# Patient Record
Sex: Male | Born: 1955 | Race: White | Hispanic: No | Marital: Single | Smoking: Never smoker
Health system: Southern US, Community
[De-identification: ages and names within clinical notes are randomized; demographics above are authoritative.]

## PROBLEM LIST (undated history)

## (undated) DIAGNOSIS — T7840XA Allergy, unspecified, initial encounter: Secondary | ICD-10-CM

## (undated) DIAGNOSIS — R1310 Dysphagia, unspecified: Secondary | ICD-10-CM

## (undated) DIAGNOSIS — E876 Hypokalemia: Secondary | ICD-10-CM

## (undated) DIAGNOSIS — E538 Deficiency of other specified B group vitamins: Secondary | ICD-10-CM

## (undated) DIAGNOSIS — M6281 Muscle weakness (generalized): Secondary | ICD-10-CM

## (undated) DIAGNOSIS — J309 Allergic rhinitis, unspecified: Secondary | ICD-10-CM

## (undated) DIAGNOSIS — I509 Heart failure, unspecified: Secondary | ICD-10-CM

## (undated) DIAGNOSIS — R5381 Other malaise: Secondary | ICD-10-CM

## (undated) DIAGNOSIS — Z8711 Personal history of peptic ulcer disease: Secondary | ICD-10-CM

## (undated) DIAGNOSIS — D509 Iron deficiency anemia, unspecified: Secondary | ICD-10-CM

## (undated) DIAGNOSIS — Z9225 Personal history of immunosupression therapy: Secondary | ICD-10-CM

## (undated) DIAGNOSIS — Z9049 Acquired absence of other specified parts of digestive tract: Secondary | ICD-10-CM

## (undated) DIAGNOSIS — R011 Cardiac murmur, unspecified: Secondary | ICD-10-CM

## (undated) DIAGNOSIS — G40909 Epilepsy, unspecified, not intractable, without status epilepticus: Secondary | ICD-10-CM

## (undated) DIAGNOSIS — R55 Syncope and collapse: Secondary | ICD-10-CM

## (undated) DIAGNOSIS — R42 Dizziness and giddiness: Secondary | ICD-10-CM

## (undated) DIAGNOSIS — K8020 Calculus of gallbladder without cholecystitis without obstruction: Secondary | ICD-10-CM

## (undated) DIAGNOSIS — Z9289 Personal history of other medical treatment: Secondary | ICD-10-CM

## (undated) DIAGNOSIS — E43 Unspecified severe protein-calorie malnutrition: Secondary | ICD-10-CM

## (undated) DIAGNOSIS — E559 Vitamin D deficiency, unspecified: Secondary | ICD-10-CM

## (undated) DIAGNOSIS — Z8719 Personal history of other diseases of the digestive system: Secondary | ICD-10-CM

## (undated) DIAGNOSIS — K5090 Crohn's disease, unspecified, without complications: Secondary | ICD-10-CM

## (undated) DIAGNOSIS — A419 Sepsis, unspecified organism: Secondary | ICD-10-CM

## (undated) DIAGNOSIS — R2681 Unsteadiness on feet: Secondary | ICD-10-CM

## (undated) DIAGNOSIS — K635 Polyp of colon: Secondary | ICD-10-CM

## (undated) DIAGNOSIS — R160 Hepatomegaly, not elsewhere classified: Secondary | ICD-10-CM

## (undated) DIAGNOSIS — R251 Tremor, unspecified: Secondary | ICD-10-CM

## (undated) DIAGNOSIS — I82409 Acute embolism and thrombosis of unspecified deep veins of unspecified lower extremity: Secondary | ICD-10-CM

## (undated) DIAGNOSIS — I351 Nonrheumatic aortic (valve) insufficiency: Secondary | ICD-10-CM

## (undated) DIAGNOSIS — K56609 Unspecified intestinal obstruction, unspecified as to partial versus complete obstruction: Secondary | ICD-10-CM

## (undated) DIAGNOSIS — K5010 Crohn's disease of large intestine without complications: Secondary | ICD-10-CM

## (undated) DIAGNOSIS — K612 Anorectal abscess: Secondary | ICD-10-CM

## (undated) DIAGNOSIS — N400 Enlarged prostate without lower urinary tract symptoms: Secondary | ICD-10-CM

## (undated) DIAGNOSIS — L509 Urticaria, unspecified: Secondary | ICD-10-CM

## (undated) DIAGNOSIS — F79 Unspecified intellectual disabilities: Secondary | ICD-10-CM

## (undated) HISTORY — DX: Unspecified intellectual disabilities: F79

## (undated) HISTORY — DX: Calculus of gallbladder without cholecystitis without obstruction: K80.20

## (undated) HISTORY — PX: COLONOSCOPY: SHX174

## (undated) HISTORY — PX: UMBILICAL HERNIA REPAIR: SHX196

## (undated) HISTORY — DX: Crohn's disease, unspecified, without complications: K50.90

## (undated) HISTORY — DX: Polyp of colon: K63.5

## (undated) HISTORY — DX: Hepatomegaly, not elsewhere classified: R16.0

## (undated) HISTORY — DX: Acute embolism and thrombosis of unspecified deep veins of unspecified lower extremity: I82.409

## (undated) HISTORY — PX: TONSILLECTOMY: SUR1361

## (undated) HISTORY — DX: Allergy, unspecified, initial encounter: T78.40XA

## (undated) HISTORY — PX: HERNIA REPAIR: SHX51

## (undated) HISTORY — PX: COLON SURGERY: SHX602

## (undated) HISTORY — DX: Anorectal abscess: K61.2

## (undated) HISTORY — DX: Iron deficiency anemia, unspecified: D50.9

## (undated) HISTORY — DX: Deficiency of other specified B group vitamins: E53.8

---

## 1979-06-24 HISTORY — PX: PILONIDAL CYST EXCISION: SHX744

## 1983-10-24 HISTORY — PX: HEMICOLECTOMY: SHX854

## 1993-02-14 ENCOUNTER — Encounter: Payer: Self-pay | Admitting: Gastroenterology

## 1998-06-16 ENCOUNTER — Ambulatory Visit (HOSPITAL_COMMUNITY): Admission: RE | Admit: 1998-06-16 | Discharge: 1998-06-16 | Payer: Self-pay | Admitting: Gastroenterology

## 1998-06-17 ENCOUNTER — Ambulatory Visit (HOSPITAL_COMMUNITY): Admission: RE | Admit: 1998-06-17 | Discharge: 1998-06-17 | Payer: Self-pay | Admitting: Gastroenterology

## 1999-01-07 ENCOUNTER — Ambulatory Visit (HOSPITAL_COMMUNITY): Admission: RE | Admit: 1999-01-07 | Discharge: 1999-01-07 | Payer: Self-pay | Admitting: Gastroenterology

## 1999-01-07 ENCOUNTER — Encounter: Payer: Self-pay | Admitting: Gastroenterology

## 1999-08-15 ENCOUNTER — Encounter (HOSPITAL_COMMUNITY): Admission: RE | Admit: 1999-08-15 | Discharge: 1999-11-13 | Payer: Self-pay | Admitting: Gastroenterology

## 2000-10-05 DIAGNOSIS — K5010 Crohn's disease of large intestine without complications: Secondary | ICD-10-CM

## 2000-10-05 HISTORY — DX: Crohn's disease of large intestine without complications: K50.10

## 2000-10-09 ENCOUNTER — Encounter: Payer: Self-pay | Admitting: Gastroenterology

## 2000-10-09 ENCOUNTER — Ambulatory Visit (HOSPITAL_COMMUNITY): Admission: RE | Admit: 2000-10-09 | Discharge: 2000-10-09 | Payer: Self-pay | Admitting: Gastroenterology

## 2004-11-24 ENCOUNTER — Ambulatory Visit: Payer: Self-pay | Admitting: Gastroenterology

## 2005-05-19 ENCOUNTER — Ambulatory Visit: Payer: Self-pay | Admitting: Gastroenterology

## 2005-06-02 ENCOUNTER — Ambulatory Visit: Payer: Self-pay | Admitting: Gastroenterology

## 2005-06-09 ENCOUNTER — Ambulatory Visit: Payer: Self-pay | Admitting: Gastroenterology

## 2005-06-16 ENCOUNTER — Ambulatory Visit: Payer: Self-pay | Admitting: Gastroenterology

## 2005-11-28 ENCOUNTER — Ambulatory Visit: Payer: Self-pay | Admitting: Gastroenterology

## 2006-05-31 ENCOUNTER — Ambulatory Visit: Payer: Self-pay | Admitting: Gastroenterology

## 2006-12-10 ENCOUNTER — Ambulatory Visit: Payer: Self-pay | Admitting: Gastroenterology

## 2006-12-10 LAB — CONVERTED CEMR LAB
Basophils Absolute: 0.1 10*3/uL (ref 0.0–0.1)
Basophils Relative: 3.3 % — ABNORMAL HIGH (ref 0.0–1.0)
Eosinophils Absolute: 0 10*3/uL (ref 0.0–0.6)
Eosinophils Relative: 0.1 % (ref 0.0–5.0)
HCT: 40.9 % (ref 39.0–52.0)
Hemoglobin: 14.2 g/dL (ref 13.0–17.0)
Iron: 160 ug/dL (ref 42–165)
Lymphocytes Relative: 10.5 % — ABNORMAL LOW (ref 12.0–46.0)
MCHC: 34.6 g/dL (ref 30.0–36.0)
MCV: 111.7 fL — ABNORMAL HIGH (ref 78.0–100.0)
Monocytes Absolute: 0.3 10*3/uL (ref 0.2–0.7)
Monocytes Relative: 7.8 % (ref 3.0–11.0)
Neutro Abs: 3.3 10*3/uL (ref 1.4–7.7)
Neutrophils Relative %: 78.3 % — ABNORMAL HIGH (ref 43.0–77.0)
Platelets: 308 10*3/uL (ref 150–400)
RBC: 3.66 M/uL — ABNORMAL LOW (ref 4.22–5.81)
RDW: 14 % (ref 11.5–14.6)
Saturation Ratios: 61.1 % — ABNORMAL HIGH (ref 20.0–50.0)
Transferrin: 186.9 mg/dL — ABNORMAL LOW (ref >=212.0)
WBC: 4.1 10*3/uL — ABNORMAL LOW (ref 4.5–10.5)

## 2007-06-25 ENCOUNTER — Ambulatory Visit: Payer: Self-pay | Admitting: Gastroenterology

## 2007-06-25 LAB — CONVERTED CEMR LAB
ALT: 16 units/L (ref 0–53)
AST: 18 units/L (ref 0–37)
Albumin: 3.6 g/dL (ref 3.5–5.2)
Alkaline Phosphatase: 126 units/L — ABNORMAL HIGH (ref 39–117)
BUN: 7 mg/dL (ref 6–23)
Basophils Absolute: 0 10*3/uL (ref 0.0–0.1)
Basophils Relative: 0.2 % (ref 0.0–1.0)
Bilirubin, Direct: 0.2 mg/dL (ref 0.0–0.3)
CO2: 30 meq/L (ref 19–32)
Calcium: 8.7 mg/dL (ref 8.4–10.5)
Chloride: 103 meq/L (ref 96–112)
Creatinine, Ser: 1.1 mg/dL (ref 0.4–1.5)
Eosinophils Absolute: 0 10*3/uL (ref 0.0–0.6)
Eosinophils Relative: 0.1 % (ref 0.0–5.0)
Ferritin: 43.7 ng/mL (ref 22.0–322.0)
Folate: 11.9 ng/mL
GFR calc Af Amer: 90 mL/min
GFR calc non Af Amer: 74 mL/min
Glucose, Bld: 117 mg/dL — ABNORMAL HIGH (ref 70–99)
HCT: 42.7 % (ref 39.0–52.0)
Hemoglobin: 14.6 g/dL (ref 13.0–17.0)
Iron: 99 ug/dL (ref 42–165)
Lymphocytes Relative: 11.2 % — ABNORMAL LOW (ref 12.0–46.0)
MCHC: 34.1 g/dL (ref 30.0–36.0)
MCV: 112 fL — ABNORMAL HIGH (ref 78.0–100.0)
Monocytes Absolute: 0.7 10*3/uL (ref 0.2–0.7)
Monocytes Relative: 12.8 % — ABNORMAL HIGH (ref 3.0–11.0)
Neutro Abs: 3.8 10*3/uL (ref 1.4–7.7)
Neutrophils Relative %: 75.7 % (ref 43.0–77.0)
Platelets: 324 10*3/uL (ref 150–400)
Potassium: 4.3 meq/L (ref 3.5–5.1)
RBC: 3.77 M/uL — ABNORMAL LOW (ref 4.22–5.81)
RDW: 13.7 % (ref 11.5–14.6)
Saturation Ratios: 36.1 % (ref 20.0–50.0)
Sodium: 137 meq/L (ref 135–145)
TSH: 2.75 microintl units/mL (ref 0.35–5.50)
Total Bilirubin: 1.1 mg/dL (ref 0.3–1.2)
Total Protein: 7.3 g/dL (ref 6.0–8.3)
Transferrin: 195.7 mg/dL — ABNORMAL LOW (ref >=212.0)
Vitamin B-12: 193 pg/mL — ABNORMAL LOW (ref 211–911)
WBC: 5.1 10*3/uL (ref 4.5–10.5)

## 2007-07-31 ENCOUNTER — Ambulatory Visit (HOSPITAL_COMMUNITY): Admission: RE | Admit: 2007-07-31 | Discharge: 2007-07-31 | Payer: Self-pay | Admitting: Gastroenterology

## 2007-07-31 ENCOUNTER — Ambulatory Visit: Payer: Self-pay | Admitting: Gastroenterology

## 2008-01-17 DIAGNOSIS — K612 Anorectal abscess: Secondary | ICD-10-CM | POA: Insufficient documentation

## 2008-01-17 DIAGNOSIS — E538 Deficiency of other specified B group vitamins: Secondary | ICD-10-CM | POA: Insufficient documentation

## 2008-01-17 DIAGNOSIS — D509 Iron deficiency anemia, unspecified: Secondary | ICD-10-CM | POA: Insufficient documentation

## 2008-01-24 ENCOUNTER — Ambulatory Visit: Payer: Self-pay | Admitting: Gastroenterology

## 2008-01-24 LAB — CONVERTED CEMR LAB
ALT: 13 units/L (ref 0–53)
AST: 15 units/L (ref 0–37)
Albumin: 3.7 g/dL (ref 3.5–5.2)
Alkaline Phosphatase: 135 units/L — ABNORMAL HIGH (ref 39–117)
BUN: 6 mg/dL (ref 6–23)
Basophils Absolute: 0 10*3/uL (ref 0.0–0.1)
Basophils Relative: 0.4 % (ref 0.0–1.0)
Bilirubin, Direct: 0.2 mg/dL (ref 0.0–0.3)
CO2: 29 meq/L (ref 19–32)
Calcium: 8.7 mg/dL (ref 8.4–10.5)
Chloride: 106 meq/L (ref 96–112)
Creatinine, Ser: 1 mg/dL (ref 0.4–1.5)
Eosinophils Absolute: 0 10*3/uL (ref 0.0–0.7)
Eosinophils Relative: 0.2 % (ref 0.0–5.0)
Ferritin: 22.8 ng/mL (ref 22.0–322.0)
Folate: 11.4 ng/mL
GFR calc Af Amer: 101 mL/min
GFR calc non Af Amer: 83 mL/min
Glucose, Bld: 120 mg/dL — ABNORMAL HIGH (ref 70–99)
HCT: 43.6 % (ref 39.0–52.0)
Hemoglobin: 14.4 g/dL (ref 13.0–17.0)
Iron: 167 ug/dL — ABNORMAL HIGH (ref 42–165)
Lymphocytes Relative: 11.9 % — ABNORMAL LOW (ref 12.0–46.0)
MCHC: 33.1 g/dL (ref 30.0–36.0)
MCV: 113.6 fL — ABNORMAL HIGH (ref 78.0–100.0)
Monocytes Absolute: 0.6 10*3/uL (ref 0.1–1.0)
Monocytes Relative: 11.6 % (ref 3.0–12.0)
Neutro Abs: 3.8 10*3/uL (ref 1.4–7.7)
Neutrophils Relative %: 75.9 % (ref 43.0–77.0)
Platelets: 267 10*3/uL (ref 150–400)
Potassium: 4.1 meq/L (ref 3.5–5.1)
RBC: 3.84 M/uL — ABNORMAL LOW (ref 4.22–5.81)
RDW: 14.8 % — ABNORMAL HIGH (ref 11.5–14.6)
Saturation Ratios: 68.4 % — ABNORMAL HIGH (ref 20.0–50.0)
Sed Rate: 16 mm/hr (ref 0–16)
Sodium: 140 meq/L (ref 135–145)
TSH: 2.7 microintl units/mL (ref 0.35–5.50)
Total Bilirubin: 0.9 mg/dL (ref 0.3–1.2)
Total Protein: 7.5 g/dL (ref 6.0–8.3)
Transferrin: 174.4 mg/dL — ABNORMAL LOW (ref >=212.0)
Vitamin B-12: 429 pg/mL (ref 211–911)
WBC: 5 10*3/uL (ref 4.5–10.5)

## 2008-01-29 DIAGNOSIS — K8020 Calculus of gallbladder without cholecystitis without obstruction: Secondary | ICD-10-CM | POA: Insufficient documentation

## 2008-01-29 DIAGNOSIS — K632 Fistula of intestine: Secondary | ICD-10-CM | POA: Insufficient documentation

## 2008-04-10 ENCOUNTER — Telehealth: Payer: Self-pay | Admitting: Gastroenterology

## 2008-08-21 ENCOUNTER — Ambulatory Visit: Payer: Self-pay | Admitting: Gastroenterology

## 2008-08-21 DIAGNOSIS — L259 Unspecified contact dermatitis, unspecified cause: Secondary | ICD-10-CM | POA: Insufficient documentation

## 2008-12-08 ENCOUNTER — Telehealth: Payer: Self-pay | Admitting: Gastroenterology

## 2009-02-22 ENCOUNTER — Encounter: Payer: Self-pay | Admitting: Gastroenterology

## 2009-03-05 ENCOUNTER — Ambulatory Visit: Payer: Self-pay | Admitting: Gastroenterology

## 2009-03-05 DIAGNOSIS — R011 Cardiac murmur, unspecified: Secondary | ICD-10-CM | POA: Insufficient documentation

## 2009-09-13 ENCOUNTER — Telehealth: Payer: Self-pay | Admitting: Gastroenterology

## 2009-09-30 ENCOUNTER — Encounter: Payer: Self-pay | Admitting: Gastroenterology

## 2009-10-05 ENCOUNTER — Encounter: Payer: Self-pay | Admitting: Gastroenterology

## 2009-11-15 ENCOUNTER — Ambulatory Visit: Payer: Self-pay | Admitting: Gastroenterology

## 2009-11-15 LAB — CONVERTED CEMR LAB
Basophils Absolute: 0 10*3/uL (ref 0.0–0.1)
Basophils Relative: 0.6 % (ref 0.0–3.0)
Eosinophils Absolute: 0 10*3/uL (ref 0.0–0.7)
Eosinophils Relative: 0.1 % (ref 0.0–5.0)
HCT: 43.1 % (ref 39.0–52.0)
Hemoglobin: 14.4 g/dL (ref 13.0–17.0)
Lymphocytes Relative: 9.4 % — ABNORMAL LOW (ref 12.0–46.0)
Lymphs Abs: 0.4 10*3/uL — ABNORMAL LOW (ref 0.7–4.0)
MCHC: 33.4 g/dL (ref 30.0–36.0)
MCV: 114.9 fL — ABNORMAL HIGH (ref 78.0–100.0)
Monocytes Absolute: 0.5 10*3/uL (ref 0.1–1.0)
Monocytes Relative: 10.5 % (ref 3.0–12.0)
Neutro Abs: 3.7 10*3/uL (ref 1.4–7.7)
Neutrophils Relative %: 79.4 % — ABNORMAL HIGH (ref 43.0–77.0)
Platelets: 268 10*3/uL (ref 150.0–400.0)
RBC: 3.76 M/uL — ABNORMAL LOW (ref 4.22–5.81)
RDW: 13.7 % (ref 11.5–14.6)
WBC: 4.6 10*3/uL (ref 4.5–10.5)

## 2010-04-13 ENCOUNTER — Encounter: Payer: Self-pay | Admitting: Gastroenterology

## 2010-05-16 ENCOUNTER — Ambulatory Visit: Payer: Self-pay | Admitting: Gastroenterology

## 2010-05-16 LAB — CONVERTED CEMR LAB
ALT: 16 units/L (ref 0–53)
AST: 19 units/L (ref 0–37)
Albumin: 3.8 g/dL (ref 3.5–5.2)
Alkaline Phosphatase: 126 units/L — ABNORMAL HIGH (ref 39–117)
Basophils Absolute: 0.1 10*3/uL (ref 0.0–0.1)
Basophils Relative: 1.2 % (ref 0.0–3.0)
Bilirubin, Direct: 0.2 mg/dL (ref 0.0–0.3)
Eosinophils Absolute: 0 10*3/uL (ref 0.0–0.7)
Eosinophils Relative: 0 % (ref 0.0–5.0)
HCT: 41.8 % (ref 39.0–52.0)
Hemoglobin: 14.4 g/dL (ref 13.0–17.0)
Lymphocytes Relative: 11 % — ABNORMAL LOW (ref 12.0–46.0)
Lymphs Abs: 0.5 10*3/uL — ABNORMAL LOW (ref 0.7–4.0)
MCHC: 34.6 g/dL (ref 30.0–36.0)
MCV: 108.3 fL — ABNORMAL HIGH (ref 78.0–100.0)
Monocytes Absolute: 0.6 10*3/uL (ref 0.1–1.0)
Monocytes Relative: 11.5 % (ref 3.0–12.0)
Neutro Abs: 3.7 10*3/uL (ref 1.4–7.7)
Neutrophils Relative %: 76.3 % (ref 43.0–77.0)
Platelets: 320 10*3/uL (ref 150.0–400.0)
RBC: 3.86 M/uL — ABNORMAL LOW (ref 4.22–5.81)
RDW: 14.3 % (ref 11.5–14.6)
Total Bilirubin: 0.7 mg/dL (ref 0.3–1.2)
Total Protein: 7 g/dL (ref 6.0–8.3)
WBC: 4.8 10*3/uL (ref 4.5–10.5)

## 2010-05-17 ENCOUNTER — Ambulatory Visit: Payer: Self-pay | Admitting: Gastroenterology

## 2010-11-24 ENCOUNTER — Encounter: Payer: Self-pay | Admitting: Gastroenterology

## 2010-11-24 NOTE — Assessment & Plan Note (Signed)
Summary: 1 yr follow up, dfs   History of Present Illness Visit Type: Follow-up Visit Primary GI MD: Verl Blalock MD [place] Primary Provider: Claris Gower, MD Chief Complaint: Crohn's, patient has not had any flares History of Present Illness:   No current general medical or gastrointestinal complaints. He is on chronic 6-MP 50 mg a day and Azulfidine 500 mg q.i.d. for chronic Crohn's fistulizing disease. He has not had previous biological infusions. His blood counts were checked every 6 months by Dr. Arelia Sneddon, and review of labs recently showed these to be normal with a stable white count. Previous iron deficiency was a problem, but has resolved with control of his Crohn's disease. He is on monthly B12 injection therapy, also multiple medications for chronic seizure disorder.   GI Review of Systems      Denies abdominal pain, acid reflux, belching, bloating, chest pain, dysphagia with liquids, dysphagia with solids, heartburn, loss of appetite, nausea, vomiting, vomiting blood, weight loss, and  weight gain.        Denies anal fissure, black tarry stools, change in bowel habit, constipation, diarrhea, diverticulosis, fecal incontinence, heme positive stool, hemorrhoids, irritable bowel syndrome, jaundice, light color stool, liver problems, rectal bleeding, and  rectal pain.    Current Medications (verified): 1)  Cyanocobalamin 1000 Mcg/ml Soln (Cyanocobalamin) .... Inject 1 Ml Intramuscularly 2)  Folic Acid 1 Mg Tabs (Folic Acid) .... Take One By Mouth Once Daily 3)  Monoject Safety Syringe/shield 22g X 1" 3 Ml Misc (Syringe/needle (Disp)) .... Use 1 Syringe As Directed 4)  Sulfasalazine 500 Mg Tabs (Sulfasalazine) .... One By Mouth Four Times A Day 5)  Mercaptopurine 50 Mg  Tabs (Mercaptopurine) .... Take One and Half By Mouth Once Daily 6)  Dilantin 100 Mg Caps (Phenytoin Sodium Extended) .... Take One By Mouth Once Daily 7)  Niaspan 500 Mg Cr-Tabs (Niacin (Antihyperlipidemic))  .[name].. 1 Tablet By Mouth At Bedtime 8)  Hydrocortisone 1 % Crea (Hydrocortisone) .... Apply 3-4- Times A Day To Rash 9)  Primidone 50 Mg Tabs (Primidone) .... Take 2 Tablet By Mouth Two Times A Day  Allergies (verified): No Known Drug Allergies  Past History:  Past medical, surgical, family and social histories (including risk factors) reviewed for relevance to current acute and chronic problems.  Past Medical History: Reviewed history from 01/17/2008 and no changes required. Current Problems:  ANEMIA, IRON DEFICIENCY (ICD-280.9) ABSCESS, RECTUM (ICD-566) CROHN'S DISEASE (ICD-555.9) SEIZURE DISORDER (ICD-780.39) VITAMIN B12 DEFICIENCY (ICD-266.2)  Past Surgical History: Reviewed history from 01/29/2008 and no changes required. Rt. Hemicolectomy & ileectomy (1985) Rectal Abscess (1983) Cyst removed from [place] History: Reviewed history from 08/21/2008 and no changes required. No FH of Colon Cancer:  Social History: Reviewed history from 03/05/2009 and no changes required. Patient has never smoked.  Alcohol Use - no Illicit Drug Use - no Patient does not get regular exercise.  Occupation: Disabled  Review of Systems  The patient denies allergy/sinus, anemia, anxiety-new, arthritis/joint pain, back pain, blood in urine, breast changes/lumps, change in vision, confusion, cough, coughing up blood, depression-new, fainting, fatigue, fever, headaches-new, hearing problems, heart murmur, heart rhythm changes, itching, menstrual pain, muscle pains/cramps, night sweats, nosebleeds, pregnancy symptoms, shortness of breath, skin rash, sleeping problems, sore throat, swelling of feet/legs, swollen lymph glands, thirst - excessive , urination - excessive , urination changes/pain, urine leakage, vision changes, and voice change.    Vital Signs:  Patient profile:   55 year old male Height:  70 inches Weight:      218.13 pounds BMI:     31.41 Pulse rate:   60 / minute Pulse  rhythm:   regular BP sitting:   130 / 66  (left arm) Cuff size:   regular  Vitals Entered By: June McMurray [place] Deborra Medina) (May 17, 2010 2:53 PM)  Physical Exam  General:  Well developed, well nourished, no acute distress.healthy appearing.   Head:  Normocephalic and atraumatic. Eyes:  PERRLA, no icterus. Lungs:  Clear throughout to auscultation. Heart:  Regular rate and rhythm; no murmurs, rubs,  or bruits. Abdomen:  Soft, nontender and nondistended. No masses, hepatosplenomegaly or hernias noted. Normal bowel sounds. Extremities:  No clubbing, cyanosis, edema or deformities noted. Neurologic:  Alert and  oriented x4;  grossly normal neurologically. Skin:  Intact without significant lesions or rashes.eczematous rash:[name]   Psych:  Alert and cooperative. Normal mood and affect.   Impression & Recommendations:  Problem # 1:  REGIONAL ENTERITIS, LARGE INTESTINE (ICD-555.1) Assessment Improved Continue current medications at current doses with every 6 month CBC and office visits.  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  Patient Instructions: 1)  Please take 6 MP as directed below--one tablet daily. 2)  We will call in longer needles for B 12 injections. 3)  Please schedule a follow-up appointment as needed.  4)  The medication list was reviewed and reconciled.  All changed / newly prescribed medications were explained.  A complete medication list was provided to the patient / caregiver. 5)  Copy sent to : Dr. Claris Gower. 6)  Please continue current medications.  7)  Please schedule a follow-up appointment in 6 months.

## 2010-12-09 ENCOUNTER — Ambulatory Visit (INDEPENDENT_AMBULATORY_CARE_PROVIDER_SITE_OTHER): Payer: Medicare Other | Admitting: Gastroenterology

## 2010-12-09 ENCOUNTER — Other Ambulatory Visit: Payer: Self-pay | Admitting: Gastroenterology

## 2010-12-09 ENCOUNTER — Other Ambulatory Visit: Payer: Medicare Other

## 2010-12-09 ENCOUNTER — Encounter: Payer: Self-pay | Admitting: Gastroenterology

## 2010-12-09 ENCOUNTER — Encounter (INDEPENDENT_AMBULATORY_CARE_PROVIDER_SITE_OTHER): Payer: Self-pay | Admitting: *Deleted

## 2010-12-09 DIAGNOSIS — K5090 Crohn's disease, unspecified, without complications: Secondary | ICD-10-CM

## 2010-12-09 DIAGNOSIS — K5010 Crohn's disease of large intestine without complications: Secondary | ICD-10-CM

## 2010-12-09 LAB — CBC WITH DIFFERENTIAL/PLATELET
Basophils Relative: 0.7 % (ref 0.0–3.0)
Eosinophils Absolute: 0 10*3/uL (ref 0.0–0.7)
Hemoglobin: 15.1 g/dL (ref 13.0–17.0)
Lymphs Abs: 0.8 10*3/uL (ref 0.7–4.0)
MCHC: 34.2 g/dL (ref 30.0–36.0)
MCV: 111 fl — ABNORMAL HIGH (ref 78.0–100.0)
Monocytes Absolute: 0.7 10*3/uL (ref 0.1–1.0)
Neutro Abs: 4.3 10*3/uL (ref 1.4–7.7)
RBC: 3.98 Mil/uL — ABNORMAL LOW (ref 4.22–5.81)

## 2010-12-09 LAB — HEPATIC FUNCTION PANEL
ALT: 12 U/L (ref 0–53)
Total Bilirubin: 0.7 mg/dL (ref 0.3–1.2)

## 2010-12-09 LAB — BASIC METABOLIC PANEL
BUN: 8 mg/dL (ref 6–23)
Calcium: 8.6 mg/dL (ref 8.4–10.5)
Creatinine, Ser: 0.9 mg/dL (ref 0.4–1.5)
GFR: 92.59 mL/min (ref >60.00)

## 2010-12-09 LAB — FOLATE: Folate: 12.3 ng/mL (ref >5.9)

## 2010-12-09 LAB — TSH: TSH: 2.84 u[IU]/mL (ref 0.35–5.50)

## 2010-12-09 LAB — IBC PANEL
Iron: 110 ug/dL (ref 42–165)
Saturation Ratios: 44.6 % (ref 20.0–50.0)
Transferrin: 176.3 mg/dL — ABNORMAL LOW (ref 212.0–360.0)

## 2010-12-09 LAB — FERRITIN: Ferritin: 35.9 ng/mL (ref 22.0–322.0)

## 2010-12-09 LAB — VITAMIN B12: Vitamin B-12: 478 pg/mL (ref 211–911)

## 2010-12-14 NOTE — Assessment & Plan Note (Signed)
Summary: Follow up on labs    History of Present Illness Visit Type: Follow-up Visit Primary GI MD: Verl Blalock MD [place] Primary Provider: Claris Gower, MD Requesting Provider: na Chief Complaint: F/u on labs. Pt denies any GI complaints  History of Present Illness:   55 year old Caucasian male with mild mental handicap he lives with his mother who takes excellent care of the patient. I have followed him for many years because of his Crohn's disease involving his colon with previous multiple fistulization's. He has had excellent response to 6-MP 50 mg a day and sulfasalazine 500 mg q.i.d. He currently is asymptomatic and denies abdominal pain, bowel irregularity, anorexia or weight loss, or any systemic complaints. He does take daily folic acid and T90 replacement.   GI Review of Systems      Denies abdominal pain, acid reflux, belching, bloating, chest pain, dysphagia with liquids, dysphagia with solids, heartburn, loss of appetite, nausea, vomiting, vomiting blood, weight loss, and  weight gain.        Denies anal fissure, black tarry stools, change in bowel habit, constipation, diarrhea, diverticulosis, fecal incontinence, heme positive stool, hemorrhoids, irritable bowel syndrome, jaundice, light color stool, liver problems, rectal bleeding, and  rectal pain.    Current Medications (verified): 1)  Cyanocobalamin 1000 Mcg/ml Soln (Cyanocobalamin) .... Inject 1 Ml Intramuscularly 2)  Folic Acid 1 Mg Tabs (Folic Acid) .... Take One By Mouth Once Daily 3)  Monoject Safety Syringe/shield 22g X 1" 3 Ml Misc (Syringe/needle (Disp)) .... Use 1 Syringe As Directed 4)  Sulfasalazine 500 Mg Tabs (Sulfasalazine) .... One By Mouth Four Times A Day 5)  Mercaptopurine 50 Mg  Tabs (Mercaptopurine) .... Take One Tablet By Mouth Once Daily 6)  Dilantin 100 Mg Caps (Phenytoin Sodium Extended) .... Take One By Mouth Once Daily 7)  Niaspan 500 Mg Cr-Tabs (Niacin (Antihyperlipidemic)) .[name].. 1  Tablet By Mouth At Bedtime 8)  Primidone 50 Mg Tabs (Primidone) .... Take 2 Tablet By Mouth Two Times A Day  Allergies (verified): No Known Drug Allergies  Past History:  Past medical, surgical, family and social histories (including risk factors) reviewed for relevance to current acute and chronic problems.  Past Medical History: ANEMIA, IRON DEFICIENCY (ICD-280.9) ABSCESS, RECTUM (ICD-566) CROHN'S DISEASE (ICD-555.9) SEIZURE DISORDER (ICD-780.39) VITAMIN B12 DEFICIENCY (ICD-266.2)  Past Surgical History: Reviewed history from 01/29/2008 and no changes required. Rt. Hemicolectomy & ileectomy (1985) Rectal Abscess (1983) Cyst removed from [place] History: Reviewed history from 08/21/2008 and no changes required. No FH of Colon Cancer:  Social History: Reviewed history from 03/05/2009 and no changes required. Patient has never smoked.  Alcohol Use - no Illicit Drug Use - no Patient does not get regular exercise.  Occupation: Disabled  Review of Systems  The patient denies allergy/sinus, anemia, anxiety-new, arthritis/joint pain, back pain, blood in urine, breast changes/lumps, change in vision, confusion, cough, coughing up blood, depression-new, fainting, fatigue, fever, hearing problems, heart murmur, heart rhythm changes, itching, menstrual pain, muscle pains/cramps, night sweats, nosebleeds, pregnancy symptoms, shortness of breath, skin rash, sleeping problems, sore throat, swelling of feet/legs, swollen lymph glands, thirst - excessive , urination - excessive , urination changes/pain, urine leakage, vision changes, voice change, and headaches-new.    Vital Signs:  Patient profile:   55 year old male Height:      70 inches Weight:      215 pounds BMI:     30.96 BSA:     2.15 Pulse rate:   60 / minute  Pulse rhythm:   regular BP sitting:   128 / 64  (left arm) Cuff size:   regular  Vitals Entered By: Hope Pigeon CMA (December 09, 2010 11:48 AM)  Physical  Exam  General:  Well developed, well nourished, no acute distress.healthy appearing.   Head:  Normocephalic and atraumatic. Eyes:  PERRLA, no icterus.exam deferred to patient's ophthalmologist.   Abdomen:  Soft, nontender and nondistended. No masses, hepatosplenomegaly or hernias noted. Normal bowel sounds. Extremities:  No clubbing, cyanosis, edema or deformities noted. Neurologic:  Alert and  oriented x4;  grossly normal neurologically. Psych:  Alert and cooperative. Normal mood and affect.   Impression & Recommendations:  Problem # 1:  REGIONAL ENTERITIS, LARGE INTESTINE (ICD-555.1) Assessment Improved continue 6-MP 50 mg a day and Azulfidine 5 mg q.i.d. CBC, metabolic profile, and 6-MP metabolites ordered. I will see him every 6 months intervals and perform his labs  Problem # 2:  ECZEMA (ICD-692.9) Assessment: Improved  Problem # 3:  CHOLELITHIASIS, ASYMPTOMATIC (ICD-574.20) Assessment: Unchanged  Problem # 4:  ANEMIA, IRON DEFICIENCY (ICD-280.9) Assessment: Improved continue P32, folic acid, and multivitamins.  Problem # 5:  SEIZURE DISORDER (ICD-780.39) Assessment: Improved continue Dilantin 100 mg twice a day and primidone 50 mg 2 tablets b.i.d.,  Other Orders: TLB-CBC Platelet - w/Differential (85025-CBCD) TLB-BMP (Basic Metabolic Panel-BMET) (95188-CZYSAYT) TLB-Hepatic/Liver Function Pnl (80076-HEPATIC) TLB-TSH (Thyroid Stimulating Hormone) (84443-TSH) TLB-B12, Serum-Total ONLY (01601-U93) TLB-Ferritin (23557-DUK) TLB-Folic Acid (Folate) (02542-HCW) TLB-IBC Pnl (Iron/FE;Transferrin) (83550-IBC) Thiopurine Metabolites ([facility] #3200) (23762)  Patient Instructions: 1)  Copy sent to : Claris Gower, MD 2)  Please go to the basement today for your labs.  3)  Your prescription(s) have been sent to you pharmacy.  4)  Please go to [facility] for your [facility] labs. 5)  Address: [place], [place] 6)  Phone: [phone] 7)  The medication  list was reviewed and reconciled.  All changed / newly prescribed medications were explained.  A complete medication list was provided to the patient / caregiver. 8)  Please schedule a follow-up appointment in 6 months. Prescriptions: MERCAPTOPURINE 50 MG  TABS (MERCAPTOPURINE) take one tablet by mouth once daily  #135 Tablet x 2   Entered by:   Bernita Buffy CMA ([place])   Authorized by:   Sable Feil MD [facility]   Signed by:   Bernita Buffy CMA ([place]) on 12/09/2010   Method used:   Electronically to        [facility]. #[id number]* (retail)       [address]       [place], [place]  [address]       Ph: [phone] or [phone]       Fax: [phone]   RxID:   [id number] SULFASALAZINE 500 MG TABS (SULFASALAZINE) one by mouth four times a day  #360 Tablet x 5   Entered by:   Bernita Buffy CMA ([place])   Authorized by:   Sable Feil MD [facility]   Signed by:   Bernita Buffy CMA ([place]) on 12/09/2010   Method used:   Electronically to        [facility]. #[id number]* (retail)       [address]       [place], [place]  [address]       Ph: [phone] or [phone]       Fax: [phone]   RxID:  [id number] MONOJECT SAFETY SYRINGE/SHIELD 22G X 1" 3 ML MISC (SYRINGE/NEEDLE (DISP)) use 1 syringe as directed  #15 x 3   Entered by:   Bernita Buffy CMA ([place])   Authorized by:   Sable Feil MD [facility]   Signed by:   Bernita Buffy CMA ([place]) on 12/09/2010   Method used:   Electronically to        [facility]. #[id number]* (retail)       [address]       [place], [place]  [address]       Ph: [phone] or [phone]       Fax: [phone]   RxID:   [id number] FOLIC ACID 1 MG TABS (FOLIC ACID) take one by mouth once daily  #30 Tablet x 5   Entered by:   Bernita Buffy CMA ([place])   Authorized by:   Sable Feil MD [facility]   Signed by:   Bernita Buffy CMA ([place]) on 12/09/2010   Method used:    Electronically to        [facility]. #[id number]* (retail)       [address]       [place], [place]  [address]       Ph: [phone] or [phone]       Fax: [phone]   RxID:   [id number] CYANOCOBALAMIN 1000 MCG/ML SOLN (CYANOCOBALAMIN) Inject 1 ml intramuscularly  #10 [place] x 3   Entered by:   Bernita Buffy CMA ([place])   Authorized by:   Sable Feil MD [facility]   Signed by:   Bernita Buffy CMA ([place]) on 12/09/2010   Method used:   Electronically to        [facility]. #[id number]* (retail)       [address]       [place], [place]  [address]       Ph: [phone] or [phone]       Fax: [phone]   RxID:   [id number]

## 2010-12-28 ENCOUNTER — Telehealth: Payer: Self-pay | Admitting: Gastroenterology

## 2011-01-03 NOTE — Progress Notes (Signed)
Summary: Speak to nurse  Phone Note Call from Patient Call back at [phone]   Caller: Patient Call For: Dr. Sharlett Iles Reason for Call: Talk to Nurse Summary of Call: Dezere from [facility] is calling for clarification on this patients labs that were faxed over Initial call taken by: Martinique Johnson,  December 28, 2010 1:03 PM  Follow-up for Phone Call        answered questions.  Follow-up by: Bernita Buffy CMA Deborra Medina),  December 28, 2010 1:37 PM

## 2011-01-20 ENCOUNTER — Telehealth: Payer: Self-pay | Admitting: *Deleted

## 2011-01-20 NOTE — Telephone Encounter (Signed)
Advised pts mother to continue 33m at same dose.

## 2011-01-25 ENCOUNTER — Encounter: Payer: Self-pay | Admitting: Gastroenterology

## 2011-01-30 ENCOUNTER — Encounter: Payer: Self-pay | Admitting: Gastroenterology

## 2011-01-31 ENCOUNTER — Encounter: Payer: Self-pay | Admitting: Gastroenterology

## 2011-03-07 NOTE — Assessment & Plan Note (Signed)
[place] OFFICE NOTE   JERRITT, CARDOZA                     MRN:          [id number]  DATE:01/24/2008                            DOB:          12-17-1955    [name] is doing well with his Crohn ileocolitis known ileocolonic  fistula.  He is on the same exact medications as previously outlined.  He is having no abdominal pain, diarrhea, or systemic complaints.   His colonoscopy on July 31, 2007, showed what appeared to be an  ileocolonic fistula, and I was unable to pass the colonoscope beyond the  80 cm level.  There is some chronic atrophy of the left colon mucosa but  no active disease.   A barium enema was obtained which showed narrowing of the colon in the  mid descending colon with opacification of dilated small bowel from an  apparent descending to small bowel fistula.  There was no evidence of  other abnormalities noted at the time of the barium exam.  CT scan was  not performed.   Love is awake, alert and in no acute distress.  Appeared his stated  age.  He weighs 221 pounds, and blood pressure is 120/62, and pulse was 56 and  regular.  His abdomen was nondistended, and there was no organomegaly, masses or  tenderness.  Bowel sounds were normal.   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  He had previous exploratory laparotomy and resection of distal small  bowel and right hemicolectomy with ileal transverse colostomy and  __________ of a bladder wall fistula in September of 1985.  It may just  be that what we are seeing on colonoscopy endoscopy is his anastomotic  area, since he has, as mentioned above, prior surgery.   RECOMMENDATIONS:  1. Continue on  all medications as listed above.  2. Usual screening laboratory parameters.  3.  Continue six-month follow up unless otherwise indicated.     Loralee Pacas. Sharlett Iles, MD, [place], [place]  Electronically Signed    DRP/MedQ  DD: 01/24/2008  DT: 01/24/2008  Job #: [id number]   cc:   Claris Gower, M.D.

## 2011-03-07 NOTE — Assessment & Plan Note (Signed)
[place] OFFICE NOTE   ADHRIT, KRENZ                     MRN:          [id number]  DATE:06/25/2007                            DOB:          [birth year]    Chales is doing extremely well.  He is having no problems with diarrhea,  abdominal pain, rectal bleeding, or any systemic complaints.  He denies  any upper GI or hepatobiliary problems.  He has been in remission with  his Crohn's disease, now on 6MP 75 mg a day for at least several years.  He also takes Azulfidine 500 mg four times a day.  He sees Dr. Claris Gower for management of his seizure disorder.   His previous problems with anemia have not resurfaced, and he is off of  iron therapy with normal hemoglobin and hematocrit.  The last blood work  was done in February, 2008 and was all normal except for borderline B12  levels.  He is on B12 replacement therapy.   Hoby last had a colonoscopy some 7-8 years ago.  He has a history of  chronic fibrostenosing Crohn's disease and had a right hemicolectomy in  1985.  He also has had recurrent problems with diarrhea, rectal  bleeding, and anemia, but all seems to have remarkably improved from  previous treatments for bacterial overgrowth syndrome periodically,  along with chronic immunosuppressive therapy.  In fact, Gustaf has done  so well over the last several years that I have contemplated stopping  his 6MP.   PHYSICAL EXAMINATION:  Exam today shows him to be a healthy-appearing  white male in no distress.  CHEST:  Clear.  He is in a regular rhythm without murmurs, rubs or gallops.  I cannot appreciate hepatosplenomegaly, abdominal masses, or tenderness.  He has multiple well-healed surgical scars on his abdomen.  He weighs 225 pounds.  Blood pressure is 114/62, pulse was 80 and  regular.   ASSESSMENT:  1. Fibrostenosing and fistulizing Crohn's disease, in remission on 6MP      and oral  Azulfidine.  2. History of chronic B12 deficiency, on B12 parenteral replacement.  3. Chronic seizure disorder, managed by Dr. Claris Gower.  4. Need for colonoscopy and dysplasia screening.   RECOMMENDATIONS:  1. Repeat standard lab tests, as per clinical protocol.  2. Renew meds as requested.  3. Outpatient colonoscopy at his convenience.     Loralee Pacas. Sharlett Iles, MD, [place], [place]  Electronically Signed    DRP/MedQ  DD: 06/25/2007  DT: 06/25/2007  Job #: [id number]   cc:   Claris Gower, M.D.

## 2011-03-10 NOTE — Assessment & Plan Note (Signed)
[facility] OFFICE NOTE   [name], [name]                     MRN:          [id number]  DATE:12/10/2006                            DOB:          18-Apr-1956    Dajon returns today and is doing well and is asymptomatic in terms of  any GI complaints. He is following a regular diet and is eating well and  having no diarrhea, constipation, melena or hematochezia or any  hepatobiliary complaints.   He is on the same medications as previously outlined.   LABORATORY DATA:  From August of last year showed normal iron levels,  B12 and folate levels. He is on B12 replacement therapy. CBC was stable  with a white count of 4300. Hemoglobin of 14.4, with normal metabolic  panel except for slightly increased alkaline phosphatase of 132.   Jahmeir continues on his anti-seizure medications per Dr. Arelia Sneddon, also  Azulfidine 500 mg four times a day and 6-MP 75 mg a day. He is no longer  on iron replacement therapy.   He weighs 222 pounds. His blood pressure is 102/66. Pulse was 84 and  regular.  ABDOMEN: Was unremarkable without organomegaly, masses or tenderness.  Bowel sounds were normal.  RECTAL: Deferred.   ASSESSMENT:  Willia's Crohn's disease seems to be stable at this time on  immunosuppressive therapy and amino salicylate therapy. He has also had  no evidence of persistent anemia. He looks about as good as I have seen  him in several years.   RECOMMENDATIONS:  1. Continue on medications as previously outlined.  2. Will continue 6 month followup and Saifullah needs to have colonoscopy      screening within the next year because of his age, his Crohn's      disease and his last examination was done in December of 2001.  3. Continue seizure medications per Dr. Arelia Sneddon.     Loralee Pacas. Sharlett Iles, MD, [place], [place]  Electronically Signed    DRP/MedQ  DD: 12/10/2006  DT: 12/10/2006  Job #: [id number]   cc:   Claris Gower, M.D.

## 2011-03-10 NOTE — Assessment & Plan Note (Signed)
[place] OFFICE NOTE   [name], [name]                     MRN:          [id number]  DATE:05/31/2006                            DOB:          Dec 07, 1955    [name] comes in today for evaluation of Crohn's disease and denies any GI  complaints whatsoever.  He is having regular bowel movements without melena,  hematochezia, or abdominal pain.  His appetite is good and his weight is  stable and he is eating pretty much whatever he wants.  He denies upper GI  or hepatobiliary complaints.  His seizure disorder is followed by Dr. Arelia Sneddon  and he is on his same medications which include folic acid 1 mg a day,  primidone three tablets twice a day, Azulfidine 500 mg q.i.d., 6-MP 75 mg a  day, Dilantin 100 mg a day, and monthly B12 injections.   He has previously had a problem with B12 deficiency and iron deficiency but  this has resolved with control of his Crohn's colitis.  He is seen at six-  month intervals and last blood work was unremarkable with a white cell count  of 7500, hemoglobin 14.6, platelet count of 310,000 with a normal liver  profile.  He does have a macrocytosis secondary to 6-MP therapy.   Examination shows him to be awake and alert, in no acute distress.  His  chest is clear.  I cannot appreciate murmurs, gallops, or rubs.  He is in a  regular rhythm.  His abdomen was benign without organomegaly, masses, or  tenderness.  Bowel sounds were normal.  He weighs 219 pounds and blood  pressure 110/64.   ASSESSMENT:  1. Chronic seizure disorder followed by Dr. Arelia Sneddon.  2. 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  3. History of multifactorial anemia.   RECOMMENDATIONS:  1. Continue all medications listed above.  2. Check CBC, iron levels, B12, folate, and metabolic profile.  3. Six-month follow-ups or p.r.n. as needed.                                   Loralee Pacas. Sharlett Iles, MD, [place], [place]   DRP/MedQ  DD:  05/31/2006  DT:  05/31/2006  Job #:  [id number]   cc:   Claris Gower, MD

## 2011-07-03 ENCOUNTER — Telehealth: Payer: Self-pay | Admitting: Gastroenterology

## 2011-07-03 DIAGNOSIS — R569 Unspecified convulsions: Secondary | ICD-10-CM

## 2011-07-03 DIAGNOSIS — Z9225 Personal history of immunosupression therapy: Secondary | ICD-10-CM

## 2011-07-03 NOTE — Telephone Encounter (Signed)
Mildly mentally handicapped man who lives with his mom with hx of Crohn's with multiple fistulazations, Iron Def. Anemia, Rectal Abcess, Seizure Disorder, B12 Def. Pt on 6MP 59m/daily and Sulfasalizine 5050mQID. Mom reports pt needs labs and Dr ElArelia Sneddontated he is borderline diabetic. Last labs on 12/04/10 were: IBC, Folate, Vit B12, Ferritin, TSH, Hepatic Function, BMET, CBC and a month later he had TPMT. Last OV 12/09/10. Repeat  these labs and H1C or just use Glucose from BMET? Please advise.

## 2011-07-03 NOTE — Telephone Encounter (Signed)
Mother aware, will bring him in the morning so he can be fasting

## 2011-07-03 NOTE — Telephone Encounter (Signed)
CBC AND LIVER PROFILE ONLY

## 2011-07-04 ENCOUNTER — Other Ambulatory Visit (INDEPENDENT_AMBULATORY_CARE_PROVIDER_SITE_OTHER): Payer: Medicare Other

## 2011-07-04 DIAGNOSIS — R569 Unspecified convulsions: Secondary | ICD-10-CM

## 2011-07-04 DIAGNOSIS — Z9229 Personal history of other drug therapy: Secondary | ICD-10-CM

## 2011-07-04 DIAGNOSIS — Z9225 Personal history of immunosupression therapy: Secondary | ICD-10-CM

## 2011-07-04 LAB — HEPATIC FUNCTION PANEL
AST: 19 U/L (ref 0–37)
Albumin: 3.8 g/dL (ref 3.5–5.2)

## 2011-07-04 LAB — CBC WITH DIFFERENTIAL/PLATELET
Eosinophils Relative: 0 % (ref 0.0–5.0)
HCT: 39.6 % (ref 39.0–52.0)
Lymphs Abs: 0.8 10*3/uL (ref 0.7–4.0)
Monocytes Relative: 11.4 % (ref 3.0–12.0)
Platelets: 261 10*3/uL (ref 150.0–400.0)
WBC: 5.5 10*3/uL (ref 4.5–10.5)

## 2011-07-05 ENCOUNTER — Telehealth: Payer: Self-pay | Admitting: Gastroenterology

## 2011-07-05 NOTE — Telephone Encounter (Signed)
yes

## 2011-07-05 NOTE — Telephone Encounter (Signed)
Notified pt's mom that pt's labs were normal. Mom was concerned with pt's Glucose level because Dr Arelia Sneddon states pt may be diabetic. We discussed pt's diet and how complex carbs affect his blood sugar; mom stated understanding. Mailed mom sample diabetic diets.

## 2011-07-05 NOTE — Telephone Encounter (Signed)
Ok to inform pt's Mom that labs are OK? Thanks.

## 2011-12-18 ENCOUNTER — Encounter: Payer: Self-pay | Admitting: *Deleted

## 2011-12-19 ENCOUNTER — Ambulatory Visit (INDEPENDENT_AMBULATORY_CARE_PROVIDER_SITE_OTHER): Payer: Medicare Other | Admitting: Gastroenterology

## 2011-12-19 ENCOUNTER — Encounter: Payer: Self-pay | Admitting: Gastroenterology

## 2011-12-19 ENCOUNTER — Other Ambulatory Visit (INDEPENDENT_AMBULATORY_CARE_PROVIDER_SITE_OTHER): Payer: Medicare Other

## 2011-12-19 VITALS — BP 142/86 | HR 88 | Ht 70.0 in | Wt 221.0 lb

## 2011-12-19 DIAGNOSIS — IMO0001 Reserved for inherently not codable concepts without codable children: Secondary | ICD-10-CM | POA: Insufficient documentation

## 2011-12-19 DIAGNOSIS — R6889 Other general symptoms and signs: Secondary | ICD-10-CM

## 2011-12-19 DIAGNOSIS — K5090 Crohn's disease, unspecified, without complications: Secondary | ICD-10-CM

## 2011-12-19 DIAGNOSIS — R350 Frequency of micturition: Secondary | ICD-10-CM

## 2011-12-19 DIAGNOSIS — Z9225 Personal history of immunosupression therapy: Secondary | ICD-10-CM

## 2011-12-19 DIAGNOSIS — G40909 Epilepsy, unspecified, not intractable, without status epilepticus: Secondary | ICD-10-CM | POA: Insufficient documentation

## 2011-12-19 LAB — BASIC METABOLIC PANEL
Calcium: 8.7 mg/dL (ref 8.4–10.5)
GFR: 79.85 mL/min (ref >60.00)
Glucose, Bld: 99 mg/dL (ref 70–99)
Potassium: 4.5 mEq/L (ref 3.5–5.1)
Sodium: 138 mEq/L (ref 135–145)

## 2011-12-19 LAB — CBC WITH DIFFERENTIAL/PLATELET
Basophils Absolute: 0 10*3/uL (ref 0.0–0.1)
Basophils Relative: 0.7 % (ref 0.0–3.0)
Eosinophils Absolute: 0 10*3/uL (ref 0.0–0.7)
HCT: 40.9 % (ref 39.0–52.0)
Hemoglobin: 13.3 g/dL (ref 13.0–17.0)
Lymphocytes Relative: 13.3 % (ref 12.0–46.0)
Lymphs Abs: 0.6 10*3/uL — ABNORMAL LOW (ref 0.7–4.0)
MCHC: 32.6 g/dL (ref 30.0–36.0)
MCV: 109.7 fl — ABNORMAL HIGH (ref 78.0–100.0)
Neutro Abs: 3.1 10*3/uL (ref 1.4–7.7)
RBC: 3.73 Mil/uL — ABNORMAL LOW (ref 4.22–5.81)
RDW: 14.6 % (ref 11.5–14.6)

## 2011-12-19 LAB — HEPATIC FUNCTION PANEL
Alkaline Phosphatase: 132 U/L — ABNORMAL HIGH (ref 39–117)
Bilirubin, Direct: 0.1 mg/dL (ref 0.0–0.3)
Total Bilirubin: 0.7 mg/dL (ref 0.3–1.2)

## 2011-12-19 LAB — PSA: PSA: 0.69 ng/mL (ref 0.10–4.00)

## 2011-12-19 LAB — SEDIMENTATION RATE: Sed Rate: 8 mm/hr (ref 0–22)

## 2011-12-19 LAB — TSH: TSH: 1.64 u[IU]/mL (ref 0.35–5.50)

## 2011-12-19 LAB — IBC PANEL
Iron: 122 ug/dL (ref 42–165)
Saturation Ratios: 50.5 % — ABNORMAL HIGH (ref 20.0–50.0)

## 2011-12-19 LAB — VITAMIN B12: Vitamin B-12: 831 pg/mL (ref 211–911)

## 2011-12-19 MED ORDER — PEG-KCL-NACL-NASULF-NA ASC-C 100 G PO SOLR: 1.0000 | Freq: Once | ORAL | Status: DC

## 2011-12-19 MED ORDER — CYANOCOBALAMIN 1000 MCG/ML IJ SOLN: 1000.0000 ug | INTRAMUSCULAR | Status: DC

## 2011-12-19 MED ORDER — MERCAPTOPURINE 50 MG PO TABS: 50.0000 mg | ORAL_TABLET | Freq: Every day | ORAL | Status: DC

## 2011-12-19 NOTE — Patient Instructions (Addendum)
Your procedure has been scheduled for 12/27/2011, please follow the seperate instructions.  Please go to the basement today for your labs.  Your prescription(s) have been sent to you pharmacy.

## 2011-12-19 NOTE — Progress Notes (Signed)
This is a 56 year old Caucasian male with chronic Crohn's disease and previous right hemicolectomy. He's been in remission for 5 years now on 6-MP 50 mg a day with normal labs every 6 months. In the past he has had severe iron deficiency associated with a flare of his colitis, Currently he is completely asymptomatic. He also suffers from a seizure disorder treated by Dr. Claris Gower. Patient also takes Azulfidine 500 mg 4 times a day and monthly B12 shots. His appetite is good and his weight is stable. He denies upper GI or hepatobiliary complaints.  Current Medications, Allergies, Past Medical History, Past Surgical History, Family History and Social History were reviewed in [employer] record.  Pertinent Review of Systems Negative.. the cardiovascular, pulmonary, genitourinary, or other neurological symptomatology.  Physical Exam: Healthy-appearing patient in no acute distress. Chest is clear and he appeared to be an irregular rhythm without murmurs gallops or rubs. Abdomen shows no organomegaly, masses or tenderness. Bowel sounds are normal. Mental status is normal. There are no gross focal neurological deficits, edema, phlebitis...  Assessment and Plan: Chronic inflammatory bowel disease, Crohn's disease, with previous right hemicolectomy. 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 Surprisingly, the patient has been in  complete clinical remission for the last 5 years. His followup of his CBC and liver function tests have been normal. There's been no evidence of recurrent anemia. I have renewed all of his medications,and  will proceed with attempts at repeat colonoscopy for colorectal cancer screening. Risks and benefits of this procedure have been explained to the patient and his mother who is now age 67. She is taking extraordinary  wonderful care of [place] over the years.PSA also requested. Encounter Diagnoses  Name Primary?  . Crohn's disease Yes  . Other general symptoms

## 2011-12-20 ENCOUNTER — Other Ambulatory Visit: Payer: Self-pay | Admitting: Gastroenterology

## 2011-12-25 ENCOUNTER — Telehealth: Payer: Self-pay | Admitting: Gastroenterology

## 2011-12-25 NOTE — Telephone Encounter (Signed)
NO

## 2011-12-25 NOTE — Telephone Encounter (Signed)
Pt already r/s for 01/03/12, Mrs Diekman ok with the appt.

## 2011-12-27 ENCOUNTER — Encounter: Payer: Medicare Other | Admitting: Gastroenterology

## 2012-01-03 ENCOUNTER — Ambulatory Visit (AMBULATORY_SURGERY_CENTER): Payer: Medicare Other | Admitting: Gastroenterology

## 2012-01-03 ENCOUNTER — Encounter: Payer: Self-pay | Admitting: Gastroenterology

## 2012-01-03 VITALS — BP 145/77 | HR 84 | Temp 97.7°F | Resp 19 | Ht 70.0 in | Wt 221.0 lb

## 2012-01-03 DIAGNOSIS — D126 Benign neoplasm of colon, unspecified: Secondary | ICD-10-CM

## 2012-01-03 DIAGNOSIS — K5140 Inflammatory polyps of colon without complications: Secondary | ICD-10-CM | POA: Insufficient documentation

## 2012-01-03 DIAGNOSIS — K5090 Crohn's disease, unspecified, without complications: Secondary | ICD-10-CM

## 2012-01-03 MED ORDER — SODIUM CHLORIDE 0.9 % IV SOLN: 500.0000 mL | INTRAVENOUS | Status: DC

## 2012-01-03 NOTE — Op Note (Signed)
[facility] [address] [facility] [address]. [place], [place]  [address]  COLONOSCOPY PROCEDURE REPORT  PATIENT:  [name], Jonathan  MR#:  [id number] BIRTHDATE:  May 24, 1956, 73 yrs. old  GENDER:  male ENDOSCOPIST:  Loralee Pacas. Sharlett Iles, MD, [facility] REF. BY: PROCEDURE DATE:  01/03/2012 PROCEDURE:  Colonoscopy with biopsy ASA CLASS:  Class II INDICATIONS:  CHRONIC IBD.5Y F/U MEDICATIONS:   propofol (Diprivan) 250 mg IV  DESCRIPTION OF PROCEDURE:   After the risks and benefits and of the procedure were explained, informed consent was obtained. Digital rectal exam was performed and revealed no abnormalities. The LB 180AL [id number] endoscope was introduced through the anus and advanced to the anastomosis.  The quality of the prep was excellent, using MoviPrep.  The instrument was then slowly withdrawn as the colon was fully examined. <<PROCEDUREIMAGES>>  FINDINGS:  There was a surgical anastomosis. ANASTOMOSIS AT 60CM.PSEUDOPOLYS AND STENOSIS NOTED.BIOPSIES HERE AND OF FOCAL RECTAL PATCH OF GRANULAR MUCOSA.   Retroflexed views in the rectum revealed no abnormalities.    The scope was then withdrawn from the patient and the procedure completed.  COMPLICATIONS:  None ENDOSCOPIC IMPRESSION: 1) Anastomosis STENOTIC ANASTOMOSIS,PRIOR PARTIAL COLECTOMY,HE HAS CROHN'S DISEASE IN REMISSION ON 6MP RX. RECOMMENDATIONS: 1) Await biopsy results 2) Continue Surveillance 3) Continue current medications  REPEAT EXAM:  No  ______________________________ Loralee Pacas. Sharlett Iles, MD, [facility]  CC:  Claris Gower, MD  n. Lorrin Mais:   Loralee Pacas. Areona Homer at 01/03/2012 11:31 AM  Coralie Common, [phone]

## 2012-01-03 NOTE — Patient Instructions (Signed)
YOU HAD AN ENDOSCOPIC PROCEDURE TODAY AT [facility]: Refer to the procedure report that was given to you for any specific questions about what was found during the examination.  If the procedure report does not answer your questions, please call your gastroenterologist to clarify.  If you requested that your care partner not be given the details of your procedure findings, then the procedure report has been included in a sealed envelope for you to review at your convenience later.  YOU SHOULD EXPECT: Some feelings of bloating in the abdomen. Passage of more gas than usual.  Walking can help get rid of the air that was put into your GI tract during the procedure and reduce the bloating. If you had a lower endoscopy (such as a colonoscopy or flexible sigmoidoscopy) you may notice spotting of blood in your stool or on the toilet paper. If you underwent a bowel prep for your procedure, then you may not have a normal bowel movement for a few days.  DIET: Your first meal following the procedure should be a light meal and then it is ok to progress to your normal diet.  A half-sandwich or bowl of soup is an example of a good first meal.  Heavy or fried foods are harder to digest and may make you feel nauseous or bloated.  Likewise meals heavy in dairy and vegetables can cause extra gas to form and this can also increase the bloating.  Drink plenty of fluids but you should avoid alcoholic beverages for 24 hours.  ACTIVITY: Your care partner should take you home directly after the procedure.  You should plan to take it easy, moving slowly for the rest of the day.  You can resume normal activity the day after the procedure however you should NOT DRIVE or use heavy machinery for 24 hours (because of the sedation medicines used during the test).    SYMPTOMS TO REPORT IMMEDIATELY: A gastroenterologist can be reached at any hour.  During normal business hours, 8:30 AM to 5:00 PM Monday through Friday,  call [phone].  After hours and on weekends, please call the GI answering service at [phone] who will take a message and have the physician on call contact you.   Following lower endoscopy (colonoscopy or flexible sigmoidoscopy):  Excessive amounts of blood in the stool  Significant tenderness or worsening of abdominal pains  Swelling of the abdomen that is new, acute  Fever of 100F or higher   FOLLOW UP: If any biopsies were taken you will be contacted by phone or by letter within the next 1-3 weeks.  Call your gastroenterologist if you have not heard about the biopsies in 3 weeks.  Our staff will call the home number listed on your records the next business day following your procedure to check on you and address any questions or concerns that you may have at that time regarding the information given to you following your procedure. This is a courtesy call and so if there is no answer at the home number and we have not heard from you through the emergency physician on call, we will assume that you have returned to your regular daily activities without incident.  SIGNATURES/CONFIDENTIALITY: You and/or your care partner have signed paperwork which will be entered into your electronic medical record.  These signatures attest to the fact that that the information above on your After Visit Summary has been reviewed and is understood.  Full responsibility of the confidentiality of  this discharge information lies with you and/or your care-partner.   RESUME MEDICATIONS.

## 2012-01-04 ENCOUNTER — Telehealth: Payer: Self-pay | Admitting: *Deleted

## 2012-01-04 NOTE — Telephone Encounter (Signed)
  Follow up Call-  Call back number 01/03/2012  Post procedure Call Back phone  # [phone]  Permission to leave phone message Yes     Patient questions:  Do you have a fever, pain , or abdominal swelling? no Pain Score  0 *  Have you tolerated food without any problems? yes  Have you been able to return to your normal activities? yes  Do you have any questions about your discharge instructions: Diet   no Medications  no Follow up visit  no  Do you have questions or concerns about your Care? no  Actions: * If pain score is 4 or above: No action needed, pain <4.  Information provided per mother.

## 2012-01-08 ENCOUNTER — Encounter: Payer: Self-pay | Admitting: Gastroenterology

## 2012-01-14 ENCOUNTER — Other Ambulatory Visit: Payer: Self-pay | Admitting: Gastroenterology

## 2012-01-18 ENCOUNTER — Other Ambulatory Visit: Payer: Self-pay | Admitting: Gastroenterology

## 2012-02-12 ENCOUNTER — Telehealth: Payer: Self-pay | Admitting: Gastroenterology

## 2012-02-12 MED ORDER — MERCAPTOPURINE 50 MG PO TABS: 50.0000 mg | ORAL_TABLET | Freq: Every day | ORAL | Status: DC

## 2012-02-12 NOTE — Telephone Encounter (Signed)
Pt needs 90 day refill of his 6MP for his insurance, rx sent.

## 2012-05-15 ENCOUNTER — Other Ambulatory Visit (HOSPITAL_COMMUNITY): Payer: Self-pay | Admitting: Family Medicine

## 2012-05-15 DIAGNOSIS — R011 Cardiac murmur, unspecified: Secondary | ICD-10-CM

## 2012-05-16 ENCOUNTER — Ambulatory Visit (HOSPITAL_COMMUNITY): Payer: Medicare Other | Attending: Cardiology

## 2012-05-16 DIAGNOSIS — R011 Cardiac murmur, unspecified: Secondary | ICD-10-CM | POA: Insufficient documentation

## 2012-05-16 DIAGNOSIS — I080 Rheumatic disorders of both mitral and aortic valves: Secondary | ICD-10-CM | POA: Insufficient documentation

## 2012-05-16 DIAGNOSIS — I379 Nonrheumatic pulmonary valve disorder, unspecified: Secondary | ICD-10-CM | POA: Insufficient documentation

## 2012-05-16 NOTE — Progress Notes (Signed)
Echocardiogram performed.  

## 2012-05-20 ENCOUNTER — Encounter (HOSPITAL_COMMUNITY): Payer: Self-pay | Admitting: Family Medicine

## 2012-07-16 ENCOUNTER — Encounter: Payer: Self-pay | Admitting: Internal Medicine

## 2012-07-29 ENCOUNTER — Other Ambulatory Visit: Payer: Self-pay | Admitting: Gastroenterology

## 2012-10-13 ENCOUNTER — Other Ambulatory Visit: Payer: Self-pay | Admitting: Gastroenterology

## 2012-10-26 ENCOUNTER — Other Ambulatory Visit: Payer: Self-pay | Admitting: Gastroenterology

## 2012-11-06 ENCOUNTER — Other Ambulatory Visit: Payer: Self-pay | Admitting: Gastroenterology

## 2012-12-19 ENCOUNTER — Ambulatory Visit (INDEPENDENT_AMBULATORY_CARE_PROVIDER_SITE_OTHER): Payer: Medicare Other | Admitting: Gastroenterology

## 2012-12-19 ENCOUNTER — Other Ambulatory Visit (INDEPENDENT_AMBULATORY_CARE_PROVIDER_SITE_OTHER): Payer: Medicare Other

## 2012-12-19 ENCOUNTER — Encounter: Payer: Self-pay | Admitting: Gastroenterology

## 2012-12-19 VITALS — BP 112/68 | HR 64 | Ht 70.0 in | Wt 233.2 lb

## 2012-12-19 DIAGNOSIS — K5090 Crohn's disease, unspecified, without complications: Secondary | ICD-10-CM

## 2012-12-19 DIAGNOSIS — D649 Anemia, unspecified: Secondary | ICD-10-CM

## 2012-12-19 DIAGNOSIS — R21 Rash and other nonspecific skin eruption: Secondary | ICD-10-CM

## 2012-12-19 DIAGNOSIS — D849 Immunodeficiency, unspecified: Secondary | ICD-10-CM

## 2012-12-19 DIAGNOSIS — D899 Disorder involving the immune mechanism, unspecified: Secondary | ICD-10-CM

## 2012-12-19 LAB — COMPREHENSIVE METABOLIC PANEL
Albumin: 3.7 g/dL (ref 3.5–5.2)
CO2: 26 mEq/L (ref 19–32)
Calcium: 8.5 mg/dL (ref 8.4–10.5)
Chloride: 103 mEq/L (ref 96–112)
GFR: 80.48 mL/min (ref >60.00)
Glucose, Bld: 109 mg/dL — ABNORMAL HIGH (ref 70–99)
Sodium: 135 mEq/L (ref 135–145)
Total Bilirubin: 0.7 mg/dL (ref 0.3–1.2)
Total Protein: 7 g/dL (ref 6.0–8.3)

## 2012-12-19 LAB — CBC WITH DIFFERENTIAL/PLATELET
Eosinophils Relative: 0 % (ref 0.0–5.0)
HCT: 43.7 % (ref 39.0–52.0)
Hemoglobin: 14.6 g/dL (ref 13.0–17.0)
Lymphocytes Relative: 12.1 % (ref 12.0–46.0)
Lymphs Abs: 0.7 10*3/uL (ref 0.7–4.0)
Monocytes Relative: 14.1 % — ABNORMAL HIGH (ref 3.0–12.0)
Platelets: 282 10*3/uL (ref 150.0–400.0)
WBC: 5.4 10*3/uL (ref 4.5–10.5)

## 2012-12-19 LAB — FERRITIN: Ferritin: 34.5 ng/mL (ref 22.0–322.0)

## 2012-12-19 LAB — VITAMIN B12: Vitamin B-12: >1500 pg/mL — ABNORMAL HIGH (ref 211–911)

## 2012-12-19 LAB — FOLATE: Folate: 12.8 ng/mL (ref >5.9)

## 2012-12-19 LAB — HEPATIC FUNCTION PANEL
Albumin: 3.7 g/dL (ref 3.5–5.2)
Alkaline Phosphatase: 140 U/L — ABNORMAL HIGH (ref 39–117)
Bilirubin, Direct: 0.2 mg/dL (ref 0.0–0.3)

## 2012-12-19 LAB — IBC PANEL: Iron: 116 ug/dL (ref 42–165)

## 2012-12-19 LAB — TSH: TSH: 2.56 u[IU]/mL (ref 0.35–5.50)

## 2012-12-19 NOTE — Progress Notes (Signed)
This is a 57 year old Caucasian male who lives chronically with his mother and is disabled.  He has chronic Crohn's disease controlled with 6-MP 50 mg a day.  He had colonoscopy one year ago showed some stenosis of his ileocolonic anastomosis was some pseudopolyposis.  He has no symptoms currently of bowel obstruction or active inflammatory bowel disease.  He has regular bowel movements without melena or hematochezia or abdominal pain.  His appetite is good and his weight is stable.  His only complaint is a new macular papular rash on his right shoulder area with some pleuritis.  He apparently is had dermatology evaluation by Dr. Erin Hearing with recent skin biopsy is pending.  Patient is been on Azulfidine and Dilantin for many years and is followed medically by Dr. Claris Gower An idiopathic seizure disorder.  The patient denies recent neurologic disorder problems or seizures.  His medications are listed and reviewed.  Is not on NSAIDs, does not use alcohol or cigarettes.  Patient is on B12 replacement therapy per his inability to absorb B12 because of his previous surgery.  Current Medications, Allergies, Past Medical History, Past Surgical History, Family History and Social History were reviewed in [employer] record.  ROS: All systems were reviewed and are negative unless otherwise stated in the HPI.          Physical Exam:But healthy-appearing patient in no distress.  He does have a macular rash of his right shoulder area and some thickening of the skin on his back.  His blood pressure 112/68, pulse 64, and weight 233 with a BMI of 33.46.  95% room air oxygen saturation.  His chest is clear and he is in a regular rhythm without murmurs gallops or rubs.  His abdomen shows no organomegaly, masses, or tenderness.  Has a very large protuberant abdomen but no ascites.  Peripheral extremities are unremarkable and mental status is normal.    Assessment and Plan:Chronic  ileocolonic Crohn's disease in remission on 6-MP at conventional low doses.  He is been on this medication for many years, has been unable to stop this immunosuppressant per relapse of his disease and relapses of bleeding and iron deficiency..  I have stopped his Azulfidine since this may be contributing to his current skin rash, also possible Dilantin sensitivity skin biopsy is pending as mentioned above.  He is to continue his 6-MP 50 mg a day, and routine labs ordered again today for review which we do every 6 months.  Other medications as per Dr. Arelia Sneddon , primary care  Encounter Diagnoses  Name Primary?  . Crohn's disease Yes  . Anemia

## 2012-12-19 NOTE — Patient Instructions (Signed)
Please stop Sulfasalazine per Dr. Sharlett Iles.  Continue your other medications.  Please follow up in one year.  We will obtain records from your skin biopsy from Dr. Delight Stare.  Your physician has requested that you go to the basement for lab work before leaving today:

## 2012-12-20 LAB — CELIAC PANEL 10
Gliadin IgA: 53.7 U/mL — ABNORMAL HIGH (ref <20)
Gliadin IgG: 12.6 U/mL (ref <20)
Tissue Transglut Ab: 17.4 U/mL (ref <20)

## 2013-04-27 ENCOUNTER — Other Ambulatory Visit: Payer: Self-pay | Admitting: Gastroenterology

## 2013-07-14 ENCOUNTER — Telehealth: Payer: Self-pay | Admitting: *Deleted

## 2013-07-14 DIAGNOSIS — R7989 Other specified abnormal findings of blood chemistry: Secondary | ICD-10-CM

## 2013-07-14 DIAGNOSIS — K5090 Crohn's disease, unspecified, without complications: Secondary | ICD-10-CM

## 2013-07-14 NOTE — Telephone Encounter (Signed)
Informed pt she needs to come in for repeat labs; pt stated understanding.

## 2013-07-14 NOTE — Telephone Encounter (Signed)
lmom for pt to call back. Labs are in the computer; CBC, Hepatic panel.

## 2013-07-14 NOTE — Telephone Encounter (Signed)
Message copied by Lance Morin on Mon Jul 14, 2013  9:40 AM ------      Message from: Lance Morin      Created: Thu Jan 02, 2013  4:03 PM       Repeat cbc and hepatic in 6 months ------

## 2013-07-15 ENCOUNTER — Other Ambulatory Visit (INDEPENDENT_AMBULATORY_CARE_PROVIDER_SITE_OTHER): Payer: Medicare Other

## 2013-07-15 DIAGNOSIS — R7989 Other specified abnormal findings of blood chemistry: Secondary | ICD-10-CM

## 2013-07-15 DIAGNOSIS — K5090 Crohn's disease, unspecified, without complications: Secondary | ICD-10-CM

## 2013-07-15 LAB — CBC WITH DIFFERENTIAL/PLATELET
Basophils Absolute: 0 10*3/uL (ref 0.0–0.1)
Basophils Relative: 0.7 % (ref 0.0–3.0)
Eosinophils Absolute: 0 10*3/uL (ref 0.0–0.7)
Hemoglobin: 14.6 g/dL (ref 13.0–17.0)
Lymphocytes Relative: 15.2 % (ref 12.0–46.0)
MCHC: 34.1 g/dL (ref 30.0–36.0)
Monocytes Relative: 9.4 % (ref 3.0–12.0)
Neutro Abs: 3.5 10*3/uL (ref 1.4–7.7)
Neutrophils Relative %: 74.7 % (ref 43.0–77.0)
RBC: 3.92 Mil/uL — ABNORMAL LOW (ref 4.22–5.81)
RDW: 15.4 % — ABNORMAL HIGH (ref 11.5–14.6)

## 2013-07-15 LAB — HEPATIC FUNCTION PANEL
AST: 14 U/L (ref 0–37)
Albumin: 3.6 g/dL (ref 3.5–5.2)
Alkaline Phosphatase: 138 U/L — ABNORMAL HIGH (ref 39–117)
Bilirubin, Direct: 0.1 mg/dL (ref 0.0–0.3)

## 2013-07-18 ENCOUNTER — Encounter: Payer: Self-pay | Admitting: *Deleted

## 2013-07-18 NOTE — Telephone Encounter (Signed)
Message copied by Lance Morin on Fri Jul 18, 2013  1:54 PM ------      Message from: [place], [place] S      Created: Thu Jul 17, 2013 10:42 AM       Please let pt know his labs are fine, no anemia ------

## 2013-07-18 NOTE — Telephone Encounter (Signed)
Opened in error

## 2013-12-01 ENCOUNTER — Other Ambulatory Visit: Payer: Self-pay | Admitting: Gastroenterology

## 2013-12-02 ENCOUNTER — Other Ambulatory Visit (INDEPENDENT_AMBULATORY_CARE_PROVIDER_SITE_OTHER): Payer: Medicare Other

## 2013-12-02 ENCOUNTER — Encounter: Payer: Self-pay | Admitting: Gastroenterology

## 2013-12-02 ENCOUNTER — Ambulatory Visit (INDEPENDENT_AMBULATORY_CARE_PROVIDER_SITE_OTHER): Payer: Medicare Other | Admitting: Gastroenterology

## 2013-12-02 VITALS — BP 140/60 | HR 72 | Ht 69.0 in | Wt 238.4 lb

## 2013-12-02 DIAGNOSIS — Z800 Family history of malignant neoplasm of digestive organs: Secondary | ICD-10-CM

## 2013-12-02 DIAGNOSIS — Z9889 Other specified postprocedural states: Secondary | ICD-10-CM

## 2013-12-02 DIAGNOSIS — Z9049 Acquired absence of other specified parts of digestive tract: Secondary | ICD-10-CM

## 2013-12-02 DIAGNOSIS — Z9225 Personal history of immunosupression therapy: Secondary | ICD-10-CM

## 2013-12-02 DIAGNOSIS — K5090 Crohn's disease, unspecified, without complications: Secondary | ICD-10-CM

## 2013-12-02 LAB — HEPATIC FUNCTION PANEL
ALK PHOS: 170 U/L — AB (ref 39–117)
ALT: 13 U/L (ref 0–53)
AST: 13 U/L (ref 0–37)
Albumin: 3.7 g/dL (ref 3.5–5.2)
Bilirubin, Direct: 0.1 mg/dL (ref 0.0–0.3)
TOTAL PROTEIN: 7 g/dL (ref 6.0–8.3)
Total Bilirubin: 0.5 mg/dL (ref 0.3–1.2)

## 2013-12-02 LAB — CBC WITH DIFFERENTIAL/PLATELET
BASOS ABS: 0 10*3/uL (ref 0.0–0.1)
Basophils Relative: 0.7 % (ref 0.0–3.0)
EOS ABS: 0 10*3/uL (ref 0.0–0.7)
Eosinophils Relative: 0 % (ref 0.0–5.0)
HCT: 45 % (ref 39.0–52.0)
HEMOGLOBIN: 14.8 g/dL (ref 13.0–17.0)
LYMPHS PCT: 11.2 % — AB (ref 12.0–46.0)
Lymphs Abs: 0.6 10*3/uL — ABNORMAL LOW (ref 0.7–4.0)
MCHC: 32.8 g/dL (ref 30.0–36.0)
MCV: 110.6 fl — ABNORMAL HIGH (ref 78.0–100.0)
Monocytes Absolute: 0.8 10*3/uL (ref 0.1–1.0)
Monocytes Relative: 15.7 % — ABNORMAL HIGH (ref 3.0–12.0)
NEUTROS ABS: 3.6 10*3/uL (ref 1.4–7.7)
Neutrophils Relative %: 72.4 % (ref 43.0–77.0)
PLATELETS: 275 10*3/uL (ref 150.0–400.0)
RBC: 4.07 Mil/uL — ABNORMAL LOW (ref 4.22–5.81)
RDW: 14.6 % (ref 11.5–14.6)
WBC: 5 10*3/uL (ref 4.5–10.5)

## 2013-12-02 LAB — BASIC METABOLIC PANEL
BUN: 11 mg/dL (ref 6–23)
CHLORIDE: 105 meq/L (ref 96–112)
CO2: 27 meq/L (ref 19–32)
CREATININE: 1 mg/dL (ref 0.4–1.5)
Calcium: 8.4 mg/dL (ref 8.4–10.5)
GFR: 79.31 mL/min (ref >60.00)
Glucose, Bld: 90 mg/dL (ref 70–99)
Potassium: 4.3 mEq/L (ref 3.5–5.1)
Sodium: 139 mEq/L (ref 135–145)

## 2013-12-02 LAB — IBC PANEL
Iron: 88 ug/dL (ref 42–165)
Saturation Ratios: 37.6 % (ref 20.0–50.0)
Transferrin: 167.2 mg/dL — ABNORMAL LOW (ref 212.0–360.0)

## 2013-12-02 LAB — FOLATE: Folate: 13.6 ng/mL (ref >5.9)

## 2013-12-02 LAB — FERRITIN: FERRITIN: 31.9 ng/mL (ref 22.0–322.0)

## 2013-12-02 LAB — TSH: TSH: 1.7 u[IU]/mL (ref 0.35–5.50)

## 2013-12-02 MED ORDER — MERCAPTOPURINE 50 MG PO TABS: 50.0000 mg | ORAL_TABLET | Freq: Every day | ORAL | Status: DC

## 2013-12-02 NOTE — Progress Notes (Signed)
This is a 58 year old Caucasian male who had previous right hemicolectomy for Crohn's disease over 20 years ago.  He had fistulizing  and stenosing disease.  He has done extremely well over the last years on 6-MP 50 mg a day, and has been very diligent about his lab screening.  He has some learning disabilities, and is accompanied today by his aunt.  His mother had surgery today for colon cancer.  This patient had colonoscopy 2 years ago which show some stenosis of his anastomosis but otherwise unremarkable except for some pseudopolyposis his colon.  He currently denies diarrhea having 1-2 formed bowel movements a day, and has no melena or hematochezia, abdominal pain, upper GI or hepatobiliary complaints.  His appetite is good and his weight is stable.  Review of his labs shows no evidence of leukopenia or liver function test abnormality.  He has a chronic seizure disorder and is followed by Dr. Claris Gower, also history of benign cardiac murmur.  He is on anti-seizure medication.  Current Medications, Allergies, Past Medical History, Past Surgical History, Family History and Social History were reviewed in [employer] record.  ROS: All systems were reviewed and are negative unless otherwise stated in the HPI.          Physical Exam: Blood pressure 140/60, pulse 72 and regular, and weight 238 with a BMI of 35.19.  Patient does not smoke.  He is a healthy-appearing patient with some balding.  His abdomen shows a midline lower abdominal wall scar but no organomegaly, masses or tenderness.  His chest is clear and he has a 1-0/2 systolic ejection murmur over the sternal area.  There is no S3 gallop noted.  Mental status is normal.    Assessment and Plan: [name] remains in clinical remission on low-dose immunosuppressive therapy.  He really is asymptomatic and has not had a flare in many years.  I see him by the lab to check CBC, metabolic profile, and anemia profile.  He  previously has some iron deficiency anemia which is resolved.  Because of his mother's history of colon cancer, and some difficulty with his previous colonoscopy, I have ordered Cologuard DNA stool analysis.  If this is positive, he will need followup colonoscopy.  Otherwise he should be okay for another year before this exam is done.  I am transferring his care to that of [name] upon my retirement.   Cc: Dr. Claris Gower

## 2013-12-02 NOTE — Patient Instructions (Signed)
Please follow up in one year with [name]  Your physician has requested that you go to the basement for the following lab work before leaving today: TSH Hepatic Function Panel  CBC BMP Anemia Profile   You will get a call from [place] regarding your Cologuard Stool Test

## 2013-12-02 NOTE — Addendum Note (Signed)
Addended by: Hope Pigeon A on: 12/02/2013 03:48 PM   Modules accepted: Orders

## 2013-12-26 ENCOUNTER — Other Ambulatory Visit: Payer: Self-pay | Admitting: Gastroenterology

## 2013-12-29 NOTE — Telephone Encounter (Signed)
Patient will be transferring to your care. Last saw Dr. Sharlett Iles [phone]. Patient is requesting refill on Azulfidine. Is it ok to refill?

## 2013-12-29 NOTE — Telephone Encounter (Signed)
Okay to refill? 

## 2014-03-31 ENCOUNTER — Inpatient Hospital Stay (HOSPITAL_COMMUNITY)
Admission: EM | Admit: 2014-03-31 | Discharge: 2014-04-02 | DRG: 101 | Disposition: A | Discharge status: Home / Self Care | Payer: Medicare Other | Attending: Family Medicine | Admitting: Family Medicine

## 2014-03-31 ENCOUNTER — Emergency Department (HOSPITAL_COMMUNITY): Payer: Medicare Other

## 2014-03-31 ENCOUNTER — Encounter (HOSPITAL_COMMUNITY): Payer: Self-pay | Admitting: Emergency Medicine

## 2014-03-31 DIAGNOSIS — R7309 Other abnormal glucose: Secondary | ICD-10-CM | POA: Diagnosis present

## 2014-03-31 DIAGNOSIS — K5090 Crohn's disease, unspecified, without complications: Secondary | ICD-10-CM | POA: Diagnosis present

## 2014-03-31 DIAGNOSIS — Y9210 Unspecified residential institution as the place of occurrence of the external cause: Secondary | ICD-10-CM | POA: Diagnosis present

## 2014-03-31 DIAGNOSIS — R55 Syncope and collapse: Secondary | ICD-10-CM

## 2014-03-31 DIAGNOSIS — T420X5A Adverse effect of hydantoin derivatives, initial encounter: Secondary | ICD-10-CM | POA: Diagnosis present

## 2014-03-31 DIAGNOSIS — G40909 Epilepsy, unspecified, not intractable, without status epilepticus: Secondary | ICD-10-CM

## 2014-03-31 DIAGNOSIS — S0001XA Abrasion of scalp, initial encounter: Secondary | ICD-10-CM

## 2014-03-31 DIAGNOSIS — F8189 Other developmental disorders of scholastic skills: Secondary | ICD-10-CM | POA: Diagnosis present

## 2014-03-31 DIAGNOSIS — S301XXA Contusion of abdominal wall, initial encounter: Secondary | ICD-10-CM

## 2014-03-31 DIAGNOSIS — H5500 Unspecified nystagmus: Secondary | ICD-10-CM | POA: Diagnosis present

## 2014-03-31 DIAGNOSIS — E042 Nontoxic multinodular goiter: Secondary | ICD-10-CM

## 2014-03-31 DIAGNOSIS — S20219A Contusion of unspecified front wall of thorax, initial encounter: Secondary | ICD-10-CM

## 2014-03-31 DIAGNOSIS — Z79899 Other long term (current) drug therapy: Secondary | ICD-10-CM

## 2014-03-31 DIAGNOSIS — I351 Nonrheumatic aortic (valve) insufficiency: Secondary | ICD-10-CM

## 2014-03-31 DIAGNOSIS — E041 Nontoxic single thyroid nodule: Secondary | ICD-10-CM | POA: Diagnosis present

## 2014-03-31 DIAGNOSIS — T07XXXA Unspecified multiple injuries, initial encounter: Secondary | ICD-10-CM | POA: Diagnosis present

## 2014-03-31 DIAGNOSIS — I350 Nonrheumatic aortic (valve) stenosis: Secondary | ICD-10-CM

## 2014-03-31 DIAGNOSIS — I359 Nonrheumatic aortic valve disorder, unspecified: Secondary | ICD-10-CM

## 2014-03-31 HISTORY — DX: Personal history of other diseases of the digestive system: Z87.19

## 2014-03-31 HISTORY — DX: Syncope and collapse: R55

## 2014-03-31 HISTORY — DX: Epilepsy, unspecified, not intractable, without status epilepticus: G40.909

## 2014-03-31 HISTORY — DX: Cardiac murmur, unspecified: R01.1

## 2014-03-31 HISTORY — DX: Personal history of other medical treatment: Z92.89

## 2014-03-31 HISTORY — DX: Personal history of peptic ulcer disease: Z87.11

## 2014-03-31 LAB — I-STAT CHEM 8, ED
BUN: 13 mg/dL (ref 6–23)
CHLORIDE: 104 meq/L (ref 96–112)
Calcium, Ion: 1.06 mmol/L — ABNORMAL LOW (ref 1.12–1.23)
Creatinine, Ser: 1.1 mg/dL (ref 0.50–1.35)
GLUCOSE: 138 mg/dL — AB (ref 70–99)
HEMATOCRIT: 48 % (ref 39.0–52.0)
Hemoglobin: 16.3 g/dL (ref 13.0–17.0)
POTASSIUM: 6 meq/L — AB (ref 3.7–5.3)
SODIUM: 137 meq/L (ref 137–147)
TCO2: 26 mmol/L (ref 0–100)

## 2014-03-31 LAB — CBC WITH DIFFERENTIAL/PLATELET
Basophils Absolute: 0 10*3/uL (ref 0.0–0.1)
Basophils Relative: 0 % (ref 0–1)
Eosinophils Absolute: 0 10*3/uL (ref 0.0–0.7)
Eosinophils Relative: 0 % (ref 0–5)
HCT: 44.2 % (ref 39.0–52.0)
Hemoglobin: 14.9 g/dL (ref 13.0–17.0)
LYMPHS ABS: 0.5 10*3/uL — AB (ref 0.7–4.0)
Lymphocytes Relative: 8 % — ABNORMAL LOW (ref 12–46)
MCH: 36.4 pg — ABNORMAL HIGH (ref 26.0–34.0)
MCHC: 33.7 g/dL (ref 30.0–36.0)
MCV: 108.1 fL — ABNORMAL HIGH (ref 78.0–100.0)
Monocytes Absolute: 0.6 10*3/uL (ref 0.1–1.0)
Monocytes Relative: 9 % (ref 3–12)
NEUTROS PCT: 83 % — AB (ref 43–77)
Neutro Abs: 5.5 10*3/uL (ref 1.7–7.7)
PLATELETS: 216 10*3/uL (ref 150–400)
RBC: 4.09 MIL/uL — ABNORMAL LOW (ref 4.22–5.81)
RDW: 14.1 % (ref 11.5–15.5)
WBC: 6.6 10*3/uL (ref 4.0–10.5)

## 2014-03-31 LAB — COMPREHENSIVE METABOLIC PANEL
ALK PHOS: 172 U/L — AB (ref 39–117)
ALT: 13 U/L (ref 0–53)
AST: 41 U/L — ABNORMAL HIGH (ref 0–37)
Albumin: 3.3 g/dL — ABNORMAL LOW (ref 3.5–5.2)
BUN: 10 mg/dL (ref 6–23)
CO2: 23 mEq/L (ref 19–32)
Calcium: 8.4 mg/dL (ref 8.4–10.5)
Chloride: 103 mEq/L (ref 96–112)
Creatinine, Ser: 1.01 mg/dL (ref 0.50–1.35)
GFR calc non Af Amer: 79 mL/min — ABNORMAL LOW (ref >90)
GLUCOSE: 139 mg/dL — AB (ref 70–99)
Potassium: 6.4 mEq/L — ABNORMAL HIGH (ref 3.7–5.3)
Sodium: 137 mEq/L (ref 137–147)
Total Bilirubin: 0.5 mg/dL (ref 0.3–1.2)
Total Protein: 6.9 g/dL (ref 6.0–8.3)

## 2014-03-31 LAB — PHENYTOIN LEVEL, TOTAL: Phenytoin Lvl: <2.5 ug/mL — ABNORMAL LOW (ref 10.0–20.0)

## 2014-03-31 LAB — PHOSPHORUS: PHOSPHORUS: 2.5 mg/dL (ref 2.3–4.6)

## 2014-03-31 LAB — TROPONIN I

## 2014-03-31 LAB — HEMOGLOBIN A1C
HEMOGLOBIN A1C: 5.1 % (ref <5.7)
Mean Plasma Glucose: 100 mg/dL (ref <117)

## 2014-03-31 LAB — ETHANOL

## 2014-03-31 LAB — MAGNESIUM: MAGNESIUM: 1.6 mg/dL (ref 1.5–2.5)

## 2014-03-31 LAB — POTASSIUM: Potassium: 6 mEq/L — ABNORMAL HIGH (ref 3.7–5.3)

## 2014-03-31 LAB — I-STAT TROPONIN, ED: Troponin i, poc: 0 ng/mL (ref 0.00–0.08)

## 2014-03-31 MED ORDER — ASPIRIN EC 325 MG PO TBEC
325.0000 mg | DELAYED_RELEASE_TABLET | Freq: Every day | ORAL | Status: DC
  Administered 2014-03-31 – 2014-04-02 (×3): 325 mg via ORAL
  Filled 2014-03-31 (×3): qty 1

## 2014-03-31 MED ORDER — ACETAMINOPHEN 325 MG PO TABS: 650.0000 mg | ORAL_TABLET | Freq: Four times a day (QID) | ORAL | Status: DC | PRN

## 2014-03-31 MED ORDER — ENOXAPARIN SODIUM 40 MG/0.4ML ~~LOC~~ SOLN
40.0000 mg | SUBCUTANEOUS | Status: DC
  Administered 2014-03-31 – 2014-04-01 (×2): 40 mg via SUBCUTANEOUS
  Filled 2014-03-31 (×4): qty 0.4

## 2014-03-31 MED ORDER — FOLIC ACID 1 MG PO TABS
1.0000 mg | ORAL_TABLET | Freq: Every day | ORAL | Status: DC
  Administered 2014-04-01 – 2014-04-02 (×2): 1 mg via ORAL
  Filled 2014-03-31 (×2): qty 1

## 2014-03-31 MED ORDER — ONDANSETRON HCL 4 MG/2ML IJ SOLN: 4.0000 mg | Freq: Four times a day (QID) | INTRAMUSCULAR | Status: DC

## 2014-03-31 MED ORDER — IOHEXOL 300 MG/ML  SOLN
100.0000 mL | Freq: Once | INTRAMUSCULAR | Status: AC | PRN
  Administered 2014-03-31: 100 mL via INTRAVENOUS

## 2014-03-31 MED ORDER — PRIMIDONE 50 MG PO TABS
100.0000 mg | ORAL_TABLET | Freq: Two times a day (BID) | ORAL | Status: DC
  Administered 2014-03-31: 100 mg via ORAL
  Filled 2014-03-31 (×4): qty 2

## 2014-03-31 MED ORDER — SODIUM CHLORIDE 0.9 % IJ SOLN
3.0000 mL | Freq: Two times a day (BID) | INTRAMUSCULAR | Status: DC
  Administered 2014-03-31 – 2014-04-01 (×2): 3 mL via INTRAVENOUS

## 2014-03-31 MED ORDER — SODIUM CHLORIDE 0.9 % IV BOLUS (SEPSIS)
1000.0000 mL | Freq: Once | INTRAVENOUS | Status: AC
  Administered 2014-03-31: 1000 mL via INTRAVENOUS

## 2014-03-31 MED ORDER — SULFASALAZINE 500 MG PO TABS
500.0000 mg | ORAL_TABLET | Freq: Four times a day (QID) | ORAL | Status: DC
  Administered 2014-03-31 – 2014-04-02 (×8): 500 mg via ORAL
  Filled 2014-03-31 (×11): qty 1

## 2014-03-31 MED ORDER — SODIUM CHLORIDE 0.9 % IV SOLN
1000.0000 mg | Freq: Once | INTRAVENOUS | Status: AC
  Administered 2014-03-31: 1000 mg via INTRAVENOUS
  Filled 2014-03-31: qty 20

## 2014-03-31 MED ORDER — POLYETHYLENE GLYCOL 3350 17 G PO PACK
17.0000 g | PACK | Freq: Every day | ORAL | Status: DC | PRN
  Administered 2014-04-02: 17 g via ORAL
  Filled 2014-03-31: qty 1

## 2014-03-31 MED ORDER — PHENYTOIN SODIUM EXTENDED 100 MG PO CAPS
100.0000 mg | ORAL_CAPSULE | Freq: Every day | ORAL | Status: DC
  Administered 2014-04-01: 100 mg via ORAL
  Filled 2014-03-31 (×2): qty 1

## 2014-03-31 MED ORDER — MUPIROCIN 2 % EX OINT
1.0000 "application " | TOPICAL_OINTMENT | Freq: Two times a day (BID) | CUTANEOUS | Status: DC
  Administered 2014-03-31 – 2014-04-02 (×4): 1 via NASAL
  Filled 2014-03-31 (×2): qty 22

## 2014-03-31 MED ORDER — ONDANSETRON HCL 4 MG PO TABS: 4.0000 mg | ORAL_TABLET | Freq: Four times a day (QID) | ORAL | Status: DC | PRN

## 2014-03-31 MED ORDER — MERCAPTOPURINE 50 MG PO TABS
50.0000 mg | ORAL_TABLET | Freq: Every day | ORAL | Status: DC
  Administered 2014-04-01 – 2014-04-02 (×2): 50 mg via ORAL
  Filled 2014-03-31 (×2): qty 1

## 2014-03-31 MED ORDER — BISACODYL 5 MG PO TBEC: 5.0000 mg | DELAYED_RELEASE_TABLET | Freq: Every day | ORAL | Status: DC | PRN

## 2014-03-31 MED ORDER — SODIUM CHLORIDE 0.9 % IV SOLN: INTRAVENOUS | Status: DC

## 2014-03-31 MED ORDER — ONDANSETRON HCL 4 MG/2ML IJ SOLN: 4.0000 mg | Freq: Four times a day (QID) | INTRAMUSCULAR | Status: DC | PRN

## 2014-03-31 MED ORDER — ACETAMINOPHEN 650 MG RE SUPP: 650.0000 mg | Freq: Four times a day (QID) | RECTAL | Status: DC | PRN

## 2014-03-31 MED ORDER — LORATADINE 10 MG PO TABS
10.0000 mg | ORAL_TABLET | Freq: Every day | ORAL | Status: DC | PRN
  Administered 2014-04-02: 10 mg via ORAL
  Filled 2014-03-31: qty 1

## 2014-03-31 MED ORDER — SODIUM CHLORIDE 0.9 % IV SOLN
INTRAVENOUS | Status: AC
  Administered 2014-03-31: 19:00:00 via INTRAVENOUS
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
## 2014-03-31 NOTE — ED Notes (Signed)
Md Ray at bedside assessing.

## 2014-03-31 NOTE — ED Notes (Signed)
PT returned from CT

## 2014-03-31 NOTE — ED Notes (Signed)
PA Tatyana at bedside assessing.
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
## 2014-04-01 ENCOUNTER — Inpatient Hospital Stay (HOSPITAL_COMMUNITY): Payer: Medicare Other

## 2014-04-01 DIAGNOSIS — I359 Nonrheumatic aortic valve disorder, unspecified: Secondary | ICD-10-CM

## 2014-04-01 LAB — URINALYSIS, ROUTINE W REFLEX MICROSCOPIC
BILIRUBIN URINE: NEGATIVE
Glucose, UA: NEGATIVE mg/dL
Hgb urine dipstick: NEGATIVE
KETONES UR: NEGATIVE mg/dL
Leukocytes, UA: NEGATIVE
Nitrite: NEGATIVE
Protein, ur: NEGATIVE mg/dL
Specific Gravity, Urine: 1.02 (ref 1.005–1.030)
UROBILINOGEN UA: 1 mg/dL (ref 0.0–1.0)
pH: 7 (ref 5.0–8.0)

## 2014-04-01 LAB — RAPID URINE DRUG SCREEN, HOSP PERFORMED
AMPHETAMINES: NOT DETECTED
BENZODIAZEPINES: NOT DETECTED
Barbiturates: POSITIVE — AB
Cocaine: NOT DETECTED
Opiates: NOT DETECTED
Tetrahydrocannabinol: NOT DETECTED

## 2014-04-01 LAB — PHENYTOIN LEVEL, TOTAL: PHENYTOIN LVL: 11 ug/mL (ref 10.0–20.0)

## 2014-04-01 LAB — BASIC METABOLIC PANEL
BUN: 10 mg/dL (ref 6–23)
CO2: 25 mEq/L (ref 19–32)
CREATININE: 1.09 mg/dL (ref 0.50–1.35)
Calcium: 8.4 mg/dL (ref 8.4–10.5)
Chloride: 102 mEq/L (ref 96–112)
GFR calc Af Amer: 84 mL/min — ABNORMAL LOW (ref >90)
GFR, EST NON AFRICAN AMERICAN: 72 mL/min — AB (ref >90)
Glucose, Bld: 101 mg/dL — ABNORMAL HIGH (ref 70–99)
Potassium: 4.6 mEq/L (ref 3.7–5.3)
Sodium: 141 mEq/L (ref 137–147)

## 2014-04-01 LAB — TROPONIN I
Troponin I: <0.3 ng/mL (ref <0.30)
Troponin I: <0.3 ng/mL (ref <0.30)

## 2014-04-01 LAB — CBC
HEMATOCRIT: 43.2 % (ref 39.0–52.0)
Hemoglobin: 14.1 g/dL (ref 13.0–17.0)
MCH: 36.3 pg — ABNORMAL HIGH (ref 26.0–34.0)
MCHC: 32.6 g/dL (ref 30.0–36.0)
MCV: 111.3 fL — ABNORMAL HIGH (ref 78.0–100.0)
Platelets: 205 10*3/uL (ref 150–400)
RBC: 3.88 MIL/uL — ABNORMAL LOW (ref 4.22–5.81)
RDW: 14.8 % (ref 11.5–15.5)
WBC: 5.7 10*3/uL (ref 4.0–10.5)

## 2014-04-01 MED ORDER — GADOBENATE DIMEGLUMINE 529 MG/ML IV SOLN
20.0000 mL | Freq: Once | INTRAVENOUS | Status: AC | PRN
  Administered 2014-04-01: 20 mL via INTRAVENOUS

## 2014-04-01 MED ORDER — PRIMIDONE 250 MG PO TABS
500.0000 mg | ORAL_TABLET | Freq: Two times a day (BID) | ORAL | Status: DC
  Administered 2014-04-01 – 2014-04-02 (×3): 500 mg via ORAL
  Filled 2014-04-01 (×4): qty 2

## 2014-04-01 MED ORDER — LEVETIRACETAM 500 MG PO TABS
500.0000 mg | ORAL_TABLET | Freq: Two times a day (BID) | ORAL | Status: DC
  Administered 2014-04-01 – 2014-04-02 (×3): 500 mg via ORAL
  Filled 2014-04-01 (×4): qty 1

## 2014-04-01 NOTE — Progress Notes (Signed)
Routine adult EEG completed, results pending. 
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
## 2014-04-01 NOTE — Progress Notes (Signed)
Echo Lab  2D Echocardiogram completed.  [place], RDCS 04/01/2014 12:37 PM

## 2014-04-02 DIAGNOSIS — E042 Nontoxic multinodular goiter: Secondary | ICD-10-CM

## 2014-04-02 DIAGNOSIS — G40909 Epilepsy, unspecified, not intractable, without status epilepticus: Principal | ICD-10-CM

## 2014-04-02 MED ORDER — LEVETIRACETAM 500 MG PO TABS: 500.0000 mg | ORAL_TABLET | Freq: Two times a day (BID) | ORAL | Status: DC
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
## 2014-04-08 ENCOUNTER — Encounter: Payer: Self-pay | Admitting: Neurology

## 2014-04-08 ENCOUNTER — Ambulatory Visit (INDEPENDENT_AMBULATORY_CARE_PROVIDER_SITE_OTHER): Payer: Medicare Other | Admitting: Neurology

## 2014-04-08 VITALS — BP 136/74 | HR 82 | Ht 70.0 in | Wt 226.9 lb

## 2014-04-08 DIAGNOSIS — R569 Unspecified convulsions: Secondary | ICD-10-CM
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
## 2014-04-09 ENCOUNTER — Other Ambulatory Visit: Payer: Self-pay | Admitting: Neurology

## 2014-04-11 LAB — PRIMIDONE AND METABOLITE LEVEL
PRIMIDONE, SERUM: 19.1 mg/L — AB (ref 5.0–12.0)
Phenobarbital: 38 mg/L (ref 15.0–40.0)

## 2014-04-11 LAB — LEVETIRACETAM LEVEL: Keppra (Levetiracetam): 10.1 ug/mL

## 2014-04-20 ENCOUNTER — Telehealth: Payer: Self-pay | Admitting: Neurology

## 2014-04-20 ENCOUNTER — Other Ambulatory Visit: Payer: Self-pay | Admitting: *Deleted

## 2014-04-20 MED ORDER — LEVETIRACETAM 500 MG PO TABS: 500.0000 mg | ORAL_TABLET | Freq: Two times a day (BID) | ORAL | Status: DC

## 2014-04-20 NOTE — Telephone Encounter (Signed)
Pt called requesting a script for KEPPRA 559m one tablet bid  Pharmacy: [facility] [address] Pt would also like the results for his lab work for last week.  C/b [phone]

## 2014-04-20 NOTE — Telephone Encounter (Signed)
Rx sent to the pharmacy please review EEG

## 2014-04-21 NOTE — Telephone Encounter (Signed)
Patient notified

## 2014-04-21 NOTE — Telephone Encounter (Signed)
Pls let pt know bloodwork is unremarkable, continue same doses of meds. Not sure which EEG results he is referring to, we have not done EEG in our office yet. Is he wanting to schedule?  Thanks

## 2014-04-21 NOTE — Telephone Encounter (Signed)
Pt called again this morning. I confirmed with her that the script was sent to the pharmacy. But she did wanted to f/u on the EEG results  C/b [phone]

## 2014-04-28 ENCOUNTER — Other Ambulatory Visit: Payer: Self-pay | Admitting: Neurology

## 2014-04-28 NOTE — Telephone Encounter (Signed)
Ok to refill? Next f/u is scheduled for Sept.

## 2014-05-06 ENCOUNTER — Ambulatory Visit (INDEPENDENT_AMBULATORY_CARE_PROVIDER_SITE_OTHER): Payer: Medicare Other | Admitting: Neurology

## 2014-05-06 DIAGNOSIS — R569 Unspecified convulsions: Secondary | ICD-10-CM
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
## 2014-05-22 ENCOUNTER — Telehealth: Payer: Self-pay | Admitting: Neurology
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
## 2014-06-04 ENCOUNTER — Telehealth: Payer: Self-pay | Admitting: Family Medicine

## 2014-06-04 NOTE — Telephone Encounter (Signed)
Received medical record release from patient for Dr. Katrine Coho office. Most recent ov note faxed to Dr. Pennie Banter attention.

## 2014-06-23 ENCOUNTER — Encounter: Payer: Self-pay | Admitting: Neurology

## 2014-06-23 ENCOUNTER — Ambulatory Visit (INDEPENDENT_AMBULATORY_CARE_PROVIDER_SITE_OTHER): Payer: Medicare Other | Admitting: Neurology

## 2014-06-23 VITALS — BP 116/62 | HR 73 | Resp 16 | Ht 70.0 in | Wt 217.0 lb

## 2014-06-23 DIAGNOSIS — R569 Unspecified convulsions: Secondary | ICD-10-CM

## 2014-06-23 MED ORDER — PRIMIDONE 250 MG PO TABS: ORAL_TABLET | ORAL | Status: DC

## 2014-06-23 MED ORDER — LEVETIRACETAM 500 MG PO TABS: 500.0000 mg | ORAL_TABLET | Freq: Two times a day (BID) | ORAL | Status: DC
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
## 2014-07-27 ENCOUNTER — Other Ambulatory Visit: Payer: Self-pay | Admitting: Gastroenterology

## 2014-07-27 DIAGNOSIS — K5090 Crohn's disease, unspecified, without complications: Secondary | ICD-10-CM

## 2014-07-27 NOTE — Telephone Encounter (Signed)
Okay to refill 6-MP Standard blood monitoring with CBC and hepatic function panel every 3 months is recommended and should be performed to remain on this medication

## 2014-07-27 NOTE — Telephone Encounter (Signed)
[name],   This patient was last seen 11-2013. Told to follow up with you in one year. Refill request for Mercaptopurine. Is it okay to refill?  Thank you

## 2014-07-27 NOTE — Telephone Encounter (Signed)
LMOM for patient to call back Patient will need labs for further refills Labs should be done in January

## 2014-10-20 ENCOUNTER — Other Ambulatory Visit: Payer: Self-pay | Admitting: Internal Medicine

## 2014-10-28 ENCOUNTER — Ambulatory Visit (INDEPENDENT_AMBULATORY_CARE_PROVIDER_SITE_OTHER): Payer: Medicare Other | Admitting: Neurology

## 2014-10-28 ENCOUNTER — Encounter: Payer: Self-pay | Admitting: Neurology

## 2014-10-28 VITALS — BP 128/64 | HR 78 | Resp 18 | Ht 70.0 in | Wt 210.0 lb

## 2014-10-28 DIAGNOSIS — R413 Other amnesia: Secondary | ICD-10-CM

## 2014-10-28 DIAGNOSIS — G40009 Localization-related (focal) (partial) idiopathic epilepsy and epileptic syndromes with seizures of localized onset, not intractable, without status epilepticus: Secondary | ICD-10-CM

## 2014-10-28 LAB — COMPREHENSIVE METABOLIC PANEL
ALK PHOS: 155 U/L — AB (ref 39–117)
ALT: 12 U/L (ref 0–53)
AST: 14 U/L (ref 0–37)
Albumin: 3.6 g/dL (ref 3.5–5.2)
BUN: 11 mg/dL (ref 6–23)
CALCIUM: 8.4 mg/dL (ref 8.4–10.5)
CO2: 27 mEq/L (ref 19–32)
Chloride: 104 mEq/L (ref 96–112)
Creat: 0.87 mg/dL (ref 0.50–1.35)
Glucose, Bld: 86 mg/dL (ref 70–99)
POTASSIUM: 4.5 meq/L (ref 3.5–5.3)
SODIUM: 138 meq/L (ref 135–145)
Total Bilirubin: 0.6 mg/dL (ref 0.2–1.2)
Total Protein: 6.3 g/dL (ref 6.0–8.3)

## 2014-10-28 LAB — TSH: TSH: 1.676 u[IU]/mL (ref 0.350–4.500)

## 2014-10-28 LAB — VITAMIN B12: Vitamin B-12: 307 pg/mL (ref 211–911)
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
## 2014-10-28 NOTE — Patient Instructions (Signed)
1. Bloodwork for TSH, B12, CMP, Primidone level 2. Call our office to confirm doses of your medications 3. Follow-up in 5-6 months

## 2014-10-30 ENCOUNTER — Encounter: Payer: Self-pay | Admitting: *Deleted

## 2014-10-30 LAB — PRIMIDONE AND METABOLITE LEVEL
Phenobarbital: 32.7 mg/L (ref 15.0–40.0)
Primidone, Serum: 30.6 mg/L (ref 5.0–12.0)

## 2014-10-31 ENCOUNTER — Encounter: Payer: Self-pay | Admitting: Neurology

## 2014-11-03 ENCOUNTER — Ambulatory Visit (INDEPENDENT_AMBULATORY_CARE_PROVIDER_SITE_OTHER): Payer: Medicare Other | Admitting: Internal Medicine

## 2014-11-03 VITALS — BP 128/60 | HR 85 | Ht 70.0 in | Wt 211.6 lb

## 2014-11-03 DIAGNOSIS — R938 Abnormal findings on diagnostic imaging of other specified body structures: Secondary | ICD-10-CM

## 2014-11-03 DIAGNOSIS — K5090 Crohn's disease, unspecified, without complications: Secondary | ICD-10-CM

## 2014-11-03 DIAGNOSIS — E538 Deficiency of other specified B group vitamins: Secondary | ICD-10-CM

## 2014-11-03 DIAGNOSIS — R9389 Abnormal findings on diagnostic imaging of other specified body structures: Secondary | ICD-10-CM

## 2014-11-03 DIAGNOSIS — Z79899 Other long term (current) drug therapy: Secondary | ICD-10-CM

## 2014-11-03 DIAGNOSIS — E041 Nontoxic single thyroid nodule: Secondary | ICD-10-CM

## 2014-11-03 MED ORDER — MERCAPTOPURINE 50 MG PO TABS: ORAL_TABLET | ORAL | Status: DC

## 2014-11-03 MED ORDER — CYANOCOBALAMIN 500 MCG/0.1ML NA SOLN: NASAL | Status: DC

## 2014-11-03 MED ORDER — SULFASALAZINE 500 MG PO TABS: 500.0000 mg | ORAL_TABLET | Freq: Every day | ORAL | Status: DC

## 2014-11-03 MED ORDER — MOVIPREP 100 G PO SOLR: 1.0000 | Freq: Once | ORAL | Status: DC
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
## 2014-11-03 NOTE — Patient Instructions (Addendum)
You have been scheduled for a colonoscopy. Please follow written instructions given to you at your visit today.  Please pick up your prep kit at the pharmacy within the next 1-3 days. If you use inhalers (even only as needed), please bring them with you on the day of your procedure. Your physician has requested that you go to www.startemmi.com and enter the access code given to you at your visit today. This web site gives a general overview about your procedure. However, you should still follow specific instructions given to you by our office regarding your preparation for the procedure.  You have been scheduled to see Jacelyn Pi MD on _______________ at ___________ for evaluation of thyroid nodules.  Their information is as follows: Address: [address], [place], [place] [address]  [phone]  We have sent the following medications to your pharmacy for you to pick up at your convenience: 13m Sulfasalazine Nascobal (1 spray, 1 nostril, 1 time per week x 6 months... Then get b12 levels rechecked)  Your physician has requested that you go to the basement for the following lab work on 05/04/15: CBC, CMET, B12

## 2014-11-05 ENCOUNTER — Telehealth: Payer: Self-pay | Admitting: *Deleted

## 2014-11-05 NOTE — Telephone Encounter (Signed)
Left message with patient's mother to have patient call back. Dr Almetta Lovely office has scheduled an appointment for patient on 12/01/14 @ 10:30 am.

## 2014-11-06 ENCOUNTER — Encounter: Payer: Self-pay | Admitting: Internal Medicine

## 2014-11-06 ENCOUNTER — Ambulatory Visit (AMBULATORY_SURGERY_CENTER): Payer: Medicare Other | Admitting: Internal Medicine

## 2014-11-06 VITALS — BP 132/70 | HR 70 | Temp 96.4°F | Resp 15 | Ht 70.0 in | Wt 211.0 lb

## 2014-11-06 DIAGNOSIS — Z8719 Personal history of other diseases of the digestive system: Secondary | ICD-10-CM

## 2014-11-06 DIAGNOSIS — K50919 Crohn's disease, unspecified, with unspecified complications: Secondary | ICD-10-CM

## 2014-11-06 MED ORDER — SODIUM CHLORIDE 0.9 % IV SOLN: 500.0000 mL | INTRAVENOUS | Status: DC

## 2014-11-06 NOTE — Progress Notes (Signed)
Procedure ends, to recovery, report given and VSS. 

## 2014-11-06 NOTE — Patient Instructions (Signed)
YOU HAD AN ENDOSCOPIC PROCEDURE TODAY AT [facility]: Refer to the procedure report that was given to you for any specific questions about what was found during the examination.  If the procedure report does not answer your questions, please call your gastroenterologist to clarify.  If you requested that your care partner not be given the details of your procedure findings, then the procedure report has been included in a sealed envelope for you to review at your convenience later.  YOU SHOULD EXPECT: Some feelings of bloating in the abdomen. Passage of more gas than usual.  Walking can help get rid of the air that was put into your GI tract during the procedure and reduce the bloating. If you had a lower endoscopy (such as a colonoscopy or flexible sigmoidoscopy) you may notice spotting of blood in your stool or on the toilet paper. If you underwent a bowel prep for your procedure, then you may not have a normal bowel movement for a few days.  DIET: Your first meal following the procedure should be a light meal and then it is ok to progress to your normal diet.  A half-sandwich or bowl of soup is an example of a good first meal.  Heavy or fried foods are harder to digest and may make you feel nauseous or bloated.  Likewise meals heavy in dairy and vegetables can cause extra gas to form and this can also increase the bloating.  Drink plenty of fluids but you should avoid alcoholic beverages for 24 hours.  ACTIVITY: Your care partner should take you home directly after the procedure.  You should plan to take it easy, moving slowly for the rest of the day.  You can resume normal activity the day after the procedure however you should NOT DRIVE or use heavy machinery for 24 hours (because of the sedation medicines used during the test).    SYMPTOMS TO REPORT IMMEDIATELY: A gastroenterologist can be reached at any hour.  During normal business hours, 8:30 AM to 5:00 PM Monday through Friday,  call [phone].  After hours and on weekends, please call the GI answering service at [phone] who will take a message and have the physician on call contact you.   Following lower endoscopy (colonoscopy or flexible sigmoidoscopy):  Excessive amounts of blood in the stool  Significant tenderness or worsening of abdominal pains  Swelling of the abdomen that is new, acute  Fever of 100F or higher  FOLLOW UP: If any biopsies were taken you will be contacted by phone or by letter within the next 1-3 weeks.  Call your gastroenterologist if you have not heard about the biopsies in 3 weeks.  Our staff will call the home number listed on your records the next business day following your procedure to check on you and address any questions or concerns that you may have at that time regarding the information given to you following your procedure. This is a courtesy call and so if there is no answer at the home number and we have not heard from you through the emergency physician on call, we will assume that you have returned to your regular daily activities without incident.  SIGNATURES/CONFIDENTIALITY: You and/or your care partner have signed paperwork which will be entered into your electronic medical record.  These signatures attest to the fact that that the information above on your After Visit Summary has been reviewed and is understood.  Full responsibility of the confidentiality of this  discharge information lies with you and/or your care-partner.  Await pathology  Continue your normal medications  Dr. Vena Rua office will contact you regarding a CT scan; depending on biopsy results

## 2014-11-06 NOTE — Op Note (Signed)
[facility]  [address] [place], [address]   COLONOSCOPY PROCEDURE REPORT  PATIENT: [name], [name]  MR#: [id number] BIRTHDATE: [birth year] , 58  yrs. old GENDER: male ENDOSCOPIST: Jerene Bears, MD PROCEDURE DATE:  11/06/2014 PROCEDURE:   Colonoscopy with biopsy First Screening Colonoscopy - Avg.  risk and is 50 yrs.  old or older - No.  Prior Negative Screening - Now for repeat screening. N/A  History of Adenoma - Now for follow-up colonoscopy & has been > or = to 3 yrs.  N/A  Polyps Removed Today? No.  Polyps Removed Today? No.  Recommend repeat exam, <10 yrs? Polyps Removed Today? No.  Recommend repeat exam, <10 yrs? Yes.  Polyps Removed Today? No.  Recommend repeat exam, <10 yrs? Yes.  High risk (family or personal hx). ASA CLASS:   Class III INDICATIONS:previously diagnosed ileocolonic Crohn's disease and previously diagnosed ileocolonic Crohn's disease with current disease duration > 8 years, last colonoscopy 2013 (Dr. Sharlett Iles).  MEDICATIONS: Monitored anesthesia care and Propofol 200 mg IV  DESCRIPTION OF PROCEDURE:   After the risks benefits and alternatives of the procedure were thoroughly explained, informed consent was obtained.  The digital rectal exam revealed no rectal mass.   The [id number]  endoscope was introduced through the anus and advanced to the surgical anastomosis. No adverse events experienced.   The quality of the prep was good, using MoviPrep  The instrument was then slowly withdrawn as the colon was fully examined.   COLON FINDINGS: There was evidence of a presumed stenosed prior surgical anastomosis in the proximal descending colon. Despite change to the pediatric colonoscopy this narrowing was unable to be traversed.  Multiple biopsies of the area were performed using cold forceps. Cannot exclude colonic stenosis mimicking surgical anastomosis.  Just distal to the anastomosis there was polypoid mucosa that did not  appear endoscopically adenomatous, multiple biopsies.   The colonic mucosa appeared normal in the rectum, sigmoid colon, and descending colon.  Multiple biopsies in 4 quadrant fashion every 8-10 cm were performed using cold forceps for dysplasia surveillance.  Retroflexed views revealed no abnormalities. The time to cecum=5 minutes 00 seconds.  Withdrawal time=15 minutes 00 seconds.  The scope was withdrawn and the procedure completed. COMPLICATIONS: There were no immediate complications.  ENDOSCOPIC IMPRESSION: 1.   There was evidence of prior surgical anastomosis in the proximal descending colon at 45 cm; multiple biopsies of the area were performed using cold forceps, polypoid mucosa in this area also biopsied 2.   The colonic mucosa appeared normal in the rectum, sigmoid colon, and descending colon; IBD surveillance biopsies were performed using cold forceps  RECOMMENDATIONS: 1.  Await biopsy results 2.  Await biopsies but recommend CT enterography to ensure complete visualization of the small bowel and colon 3.  Continue current medications  eSigned:  Jerene Bears, MD 11/06/2014 12:34 PM     cc: The Patient and Alden Server, MD

## 2014-11-06 NOTE — Telephone Encounter (Signed)
I have spoken to patient. Dr Almetta Lovely office already contacted him with appointment information, so he is aware of appointment.

## 2014-11-06 NOTE — Progress Notes (Signed)
Called to room to assist during endoscopic procedure.  Patient ID and intended procedure confirmed with present staff. Received instructions for my participation in the procedure from the performing physician.  

## 2014-11-09 ENCOUNTER — Telehealth: Payer: Self-pay | Admitting: *Deleted

## 2014-11-09 ENCOUNTER — Other Ambulatory Visit: Payer: Self-pay

## 2014-11-09 DIAGNOSIS — K5090 Crohn's disease, unspecified, without complications: Secondary | ICD-10-CM

## 2014-11-09 NOTE — Telephone Encounter (Signed)
  Follow up Call-  Call back number 11/06/2014  Post procedure Call Back phone  # [phone]  Permission to leave phone message Yes   spoke with mother; pt was gone  Patient questions:  Do you have a fever, pain , or abdominal swelling? No. Pain Score  0 *  Have you tolerated food without any problems? Yes.    Have you been able to return to your normal activities? Yes.    Do you have any questions about your discharge instructions: Diet   No. Medications  No. Follow up visit  No.  Do you have questions or concerns about your Care? No.  Actions: * If pain score is 4 or above: No action needed, pain <4.

## 2014-11-10 ENCOUNTER — Telehealth: Payer: Self-pay

## 2014-11-10 NOTE — Telephone Encounter (Signed)
Pt scheduled for CT enterography at [facility] 11/17/14@2 :30pm, pt to arrive there at 1pm. Pt to be NPO after 10am. Pt to arrive at [facility] at 1pm to drink contrast. Pt aware of appt.

## 2014-11-11 ENCOUNTER — Encounter: Payer: Self-pay | Admitting: Internal Medicine

## 2014-11-17 ENCOUNTER — Ambulatory Visit (HOSPITAL_COMMUNITY): Payer: Medicare Other

## 2014-12-01 ENCOUNTER — Other Ambulatory Visit: Payer: Self-pay | Admitting: Endocrinology

## 2014-12-01 DIAGNOSIS — E049 Nontoxic goiter, unspecified: Secondary | ICD-10-CM

## 2014-12-10 ENCOUNTER — Other Ambulatory Visit: Payer: Medicare Other

## 2014-12-10 ENCOUNTER — Ambulatory Visit
Admission: RE | Admit: 2014-12-10 | Discharge: 2014-12-10 | Disposition: A | Discharge status: Home / Self Care | Payer: Medicare Other | Source: Ambulatory Visit | Attending: Endocrinology | Admitting: Endocrinology

## 2014-12-10 DIAGNOSIS — E049 Nontoxic goiter, unspecified: Secondary | ICD-10-CM

## 2014-12-24 ENCOUNTER — Other Ambulatory Visit (INDEPENDENT_AMBULATORY_CARE_PROVIDER_SITE_OTHER): Payer: Medicare Other

## 2014-12-24 ENCOUNTER — Other Ambulatory Visit: Payer: Self-pay

## 2014-12-24 ENCOUNTER — Telehealth: Payer: Self-pay | Admitting: Internal Medicine

## 2014-12-24 DIAGNOSIS — R1084 Generalized abdominal pain: Secondary | ICD-10-CM

## 2014-12-24 LAB — BASIC METABOLIC PANEL
BUN: 10 mg/dL (ref 6–23)
CHLORIDE: 106 meq/L (ref 96–112)
CO2: 31 mEq/L (ref 19–32)
CREATININE: 0.99 mg/dL (ref 0.40–1.50)
Calcium: 8.7 mg/dL (ref 8.4–10.5)
GFR: 81.8 mL/min (ref >60.00)
Glucose, Bld: 77 mg/dL (ref 70–99)
POTASSIUM: 4.5 meq/L (ref 3.5–5.1)
Sodium: 138 mEq/L (ref 135–145)

## 2014-12-24 NOTE — Telephone Encounter (Signed)
Pt aware that he does not need to fast for labs.

## 2014-12-25 ENCOUNTER — Encounter: Payer: Self-pay | Admitting: Internal Medicine

## 2014-12-25 NOTE — Telephone Encounter (Signed)
Error

## 2014-12-28 ENCOUNTER — Ambulatory Visit (HOSPITAL_COMMUNITY): Payer: Medicare Other

## 2014-12-29 ENCOUNTER — Other Ambulatory Visit: Payer: Self-pay | Admitting: Internal Medicine

## 2014-12-29 DIAGNOSIS — E049 Nontoxic goiter, unspecified: Secondary | ICD-10-CM

## 2015-01-05 ENCOUNTER — Ambulatory Visit (HOSPITAL_COMMUNITY)
Admission: RE | Admit: 2015-01-05 | Discharge: 2015-01-05 | Disposition: A | Discharge status: Home / Self Care | Payer: Medicare Other | Source: Ambulatory Visit | Attending: Internal Medicine | Admitting: Internal Medicine

## 2015-01-05 ENCOUNTER — Encounter (HOSPITAL_COMMUNITY): Payer: Self-pay

## 2015-01-05 DIAGNOSIS — K5090 Crohn's disease, unspecified, without complications: Secondary | ICD-10-CM | POA: Insufficient documentation

## 2015-01-05 MED ORDER — IOHEXOL 300 MG/ML  SOLN
100.0000 mL | Freq: Once | INTRAMUSCULAR | Status: AC | PRN
  Administered 2015-01-05: 100 mL via INTRAVENOUS

## 2015-01-07 ENCOUNTER — Ambulatory Visit
Admission: RE | Admit: 2015-01-07 | Discharge: 2015-01-07 | Disposition: A | Discharge status: Home / Self Care | Payer: Medicare Other | Source: Ambulatory Visit | Attending: Internal Medicine | Admitting: Internal Medicine

## 2015-01-07 ENCOUNTER — Other Ambulatory Visit (HOSPITAL_COMMUNITY)
Admission: RE | Admit: 2015-01-07 | Discharge: 2015-01-07 | Disposition: A | Discharge status: Home / Self Care | Payer: Medicare Other | Source: Ambulatory Visit | Attending: Interventional Radiology | Admitting: Interventional Radiology

## 2015-01-07 ENCOUNTER — Other Ambulatory Visit: Payer: Self-pay | Admitting: Internal Medicine

## 2015-01-07 DIAGNOSIS — E049 Nontoxic goiter, unspecified: Secondary | ICD-10-CM

## 2015-01-07 DIAGNOSIS — E041 Nontoxic single thyroid nodule: Secondary | ICD-10-CM | POA: Diagnosis present

## 2015-03-24 ENCOUNTER — Other Ambulatory Visit: Payer: Self-pay | Admitting: Internal Medicine

## 2015-03-30 ENCOUNTER — Encounter: Payer: Self-pay | Admitting: Neurology

## 2015-03-30 ENCOUNTER — Ambulatory Visit (INDEPENDENT_AMBULATORY_CARE_PROVIDER_SITE_OTHER): Payer: Medicare Other | Admitting: Neurology

## 2015-03-30 VITALS — BP 118/80 | HR 74 | Resp 16 | Ht 70.0 in | Wt 211.0 lb

## 2015-03-30 DIAGNOSIS — G40009 Localization-related (focal) (partial) idiopathic epilepsy and epileptic syndromes with seizures of localized onset, not intractable, without status epilepticus: Secondary | ICD-10-CM | POA: Insufficient documentation

## 2015-03-30 MED ORDER — LEVETIRACETAM 500 MG PO TABS: 500.0000 mg | ORAL_TABLET | Freq: Two times a day (BID) | ORAL | Status: DC

## 2015-03-30 MED ORDER — PRIMIDONE 250 MG PO TABS: ORAL_TABLET | ORAL | Status: DC
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
## 2015-05-05 ENCOUNTER — Other Ambulatory Visit: Payer: Self-pay | Admitting: Internal Medicine

## 2015-07-26 ENCOUNTER — Other Ambulatory Visit: Payer: Self-pay | Admitting: Neurology

## 2015-09-15 ENCOUNTER — Other Ambulatory Visit: Payer: Self-pay | Admitting: Internal Medicine

## 2015-09-29 ENCOUNTER — Encounter: Payer: Self-pay | Admitting: Neurology

## 2015-09-29 ENCOUNTER — Ambulatory Visit (INDEPENDENT_AMBULATORY_CARE_PROVIDER_SITE_OTHER): Payer: Medicare Other | Admitting: Neurology

## 2015-09-29 VITALS — BP 118/64 | HR 75 | Resp 16 | Ht 70.0 in | Wt 205.0 lb

## 2015-09-29 DIAGNOSIS — G40009 Localization-related (focal) (partial) idiopathic epilepsy and epileptic syndromes with seizures of localized onset, not intractable, without status epilepticus: Secondary | ICD-10-CM | POA: Diagnosis not present

## 2015-09-29 MED ORDER — PRIMIDONE 250 MG PO TABS: ORAL_TABLET | ORAL | Status: DC

## 2015-09-29 MED ORDER — LEVETIRACETAM 500 MG PO TABS: 500.0000 mg | ORAL_TABLET | Freq: Two times a day (BID) | ORAL | Status: DC

## 2015-09-29 NOTE — Patient Instructions (Signed)
1. Continue all your medications 2. Continue exercise and brain stimulation activities to help with brain health 3. Follow-up in 1 year, call for any changes
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
## 2015-11-26 ENCOUNTER — Ambulatory Visit (INDEPENDENT_AMBULATORY_CARE_PROVIDER_SITE_OTHER): Payer: Medicare Other | Admitting: Podiatry

## 2015-11-26 ENCOUNTER — Ambulatory Visit: Payer: Self-pay

## 2015-11-26 ENCOUNTER — Encounter: Payer: Self-pay | Admitting: Podiatry

## 2015-11-26 VITALS — BP 151/73 | HR 76 | Resp 14

## 2015-11-26 DIAGNOSIS — S90211A Contusion of right great toe with damage to nail, initial encounter: Secondary | ICD-10-CM

## 2015-11-26 DIAGNOSIS — L97521 Non-pressure chronic ulcer of other part of left foot limited to breakdown of skin: Secondary | ICD-10-CM | POA: Diagnosis not present

## 2015-11-26 DIAGNOSIS — R52 Pain, unspecified: Secondary | ICD-10-CM | POA: Diagnosis not present

## 2015-11-26 DIAGNOSIS — M79676 Pain in unspecified toe(s): Secondary | ICD-10-CM

## 2015-11-26 DIAGNOSIS — B351 Tinea unguium: Secondary | ICD-10-CM | POA: Diagnosis not present

## 2015-11-26 NOTE — Progress Notes (Signed)
   Subjective:    Patient ID: [name], male    DOB: Dec 04, 1955, 60 y.o.   MRN: [id number]  HPI this patient presents to the office with chief complaint of painful big toe left foot. He states years ago he dropped an on an object on the left great toenail and caused the nail to become injured. He presents to the office stating that part of the nail is moving on the nailbed on the left great toe. There is no pain associated with this moving of the toenail on the left great toe. There is no evidence of any redness, swelling or infection noted to the toenail.  He requests an evaluation of left great toenail.    The patient is here today for left foot great toe pain.  Review of Systems  All other systems reviewed and are negative.      Objective:   Physical Exam GENERAL APPEARANCE: Alert, conversant. Appropriately groomed. No acute distress.  VASCULAR: Pedal pulses palpable at  [facility] and PT bilateral.  Capillary refill time is immediate to all digits,  Normal temperature gradient.  Digital hair growth is present bilateral  NEUROLOGIC: sensation is normal to 5.07 monofilament at 5/5 sites bilateral.  Light touch is intact bilateral, Muscle strength normal.  MUSCULOSKELETAL: acceptable muscle strength, tone and stability bilateral.  Intrinsic muscluature intact bilateral.  Rectus appearance of foot and digits noted bilateral.   DERMATOLOGIC: skin color, texture, and turgor are within normal limits.  No preulcerative lesions or ulcers  are seen, no interdigital maceration noted.  No open lesions present.  .  N  AILS  Thick disfigured discolored hallux toenail with poor attachment of the nail medially which leads to motion of the nail on the nail bed.No signs of infection noted. Thick mycotic nails both feet noted.  Requested by sister to treat all nails.        Assessment & Plan:  Onychomycosis with subungual ulcer left hallux.  IE  Debridement of nail which reveals ulceration of the nail  bed left hallux.  Neosporin/DSD.  Home soaks recommended.  If this condition worsens or becomes very painful, the patient was told to contact this office or go to the Emergency Department at the hospital.  Gardiner Barefoot DPM

## 2015-12-01 ENCOUNTER — Emergency Department (HOSPITAL_COMMUNITY): Payer: Medicare Other

## 2015-12-01 ENCOUNTER — Inpatient Hospital Stay (HOSPITAL_COMMUNITY)
Admission: EM | Admit: 2015-12-01 | Discharge: 2015-12-14 | DRG: 329 | Disposition: A | Discharge status: Skilled Nursing Facility | Payer: Medicare Other | Attending: Internal Medicine | Admitting: Internal Medicine

## 2015-12-01 ENCOUNTER — Encounter (HOSPITAL_COMMUNITY): Payer: Self-pay

## 2015-12-01 ENCOUNTER — Inpatient Hospital Stay (HOSPITAL_COMMUNITY): Payer: Medicare Other

## 2015-12-01 DIAGNOSIS — K567 Ileus, unspecified: Secondary | ICD-10-CM | POA: Diagnosis not present

## 2015-12-01 DIAGNOSIS — N179 Acute kidney failure, unspecified: Secondary | ICD-10-CM | POA: Diagnosis present

## 2015-12-01 DIAGNOSIS — R0902 Hypoxemia: Secondary | ICD-10-CM | POA: Diagnosis present

## 2015-12-01 DIAGNOSIS — K9189 Other postprocedural complications and disorders of digestive system: Secondary | ICD-10-CM | POA: Diagnosis not present

## 2015-12-01 DIAGNOSIS — A419 Sepsis, unspecified organism: Secondary | ICD-10-CM | POA: Diagnosis present

## 2015-12-01 DIAGNOSIS — E876 Hypokalemia: Secondary | ICD-10-CM | POA: Diagnosis present

## 2015-12-01 DIAGNOSIS — J690 Pneumonitis due to inhalation of food and vomit: Secondary | ICD-10-CM | POA: Diagnosis not present

## 2015-12-01 DIAGNOSIS — Z8711 Personal history of peptic ulcer disease: Secondary | ICD-10-CM

## 2015-12-01 DIAGNOSIS — N390 Urinary tract infection, site not specified: Secondary | ICD-10-CM | POA: Diagnosis present

## 2015-12-01 DIAGNOSIS — R1310 Dysphagia, unspecified: Secondary | ICD-10-CM | POA: Diagnosis present

## 2015-12-01 DIAGNOSIS — K56609 Unspecified intestinal obstruction, unspecified as to partial versus complete obstruction: Secondary | ICD-10-CM | POA: Diagnosis present

## 2015-12-01 DIAGNOSIS — R112 Nausea with vomiting, unspecified: Secondary | ICD-10-CM | POA: Diagnosis present

## 2015-12-01 DIAGNOSIS — Z79899 Other long term (current) drug therapy: Secondary | ICD-10-CM | POA: Diagnosis not present

## 2015-12-01 DIAGNOSIS — R579 Shock, unspecified: Secondary | ICD-10-CM | POA: Diagnosis present

## 2015-12-01 DIAGNOSIS — Z808 Family history of malignant neoplasm of other organs or systems: Secondary | ICD-10-CM | POA: Diagnosis not present

## 2015-12-01 DIAGNOSIS — G40909 Epilepsy, unspecified, not intractable, without status epilepticus: Secondary | ICD-10-CM

## 2015-12-01 DIAGNOSIS — D7589 Other specified diseases of blood and blood-forming organs: Secondary | ICD-10-CM | POA: Diagnosis present

## 2015-12-01 DIAGNOSIS — R6510 Systemic inflammatory response syndrome (SIRS) of non-infectious origin without acute organ dysfunction: Secondary | ICD-10-CM | POA: Diagnosis present

## 2015-12-01 DIAGNOSIS — E870 Hyperosmolality and hypernatremia: Secondary | ICD-10-CM | POA: Diagnosis present

## 2015-12-01 DIAGNOSIS — K50918 Crohn's disease, unspecified, with other complication: Secondary | ICD-10-CM | POA: Diagnosis not present

## 2015-12-01 DIAGNOSIS — R109 Unspecified abdominal pain: Secondary | ICD-10-CM

## 2015-12-01 DIAGNOSIS — K50813 Crohn's disease of both small and large intestine with fistula: Secondary | ICD-10-CM | POA: Diagnosis present

## 2015-12-01 DIAGNOSIS — E162 Hypoglycemia, unspecified: Secondary | ICD-10-CM | POA: Diagnosis present

## 2015-12-01 DIAGNOSIS — Z5331 Laparoscopic surgical procedure converted to open procedure: Secondary | ICD-10-CM | POA: Diagnosis not present

## 2015-12-01 DIAGNOSIS — Z9049 Acquired absence of other specified parts of digestive tract: Secondary | ICD-10-CM

## 2015-12-01 DIAGNOSIS — K565 Intestinal adhesions [bands] with obstruction (postprocedural) (postinfection): Principal | ICD-10-CM | POA: Diagnosis present

## 2015-12-01 DIAGNOSIS — K5010 Crohn's disease of large intestine without complications: Secondary | ICD-10-CM | POA: Diagnosis not present

## 2015-12-01 DIAGNOSIS — Z86010 Personal history of colonic polyps: Secondary | ICD-10-CM | POA: Diagnosis not present

## 2015-12-01 DIAGNOSIS — K5669 Other intestinal obstruction: Secondary | ICD-10-CM | POA: Diagnosis not present

## 2015-12-01 DIAGNOSIS — R0989 Other specified symptoms and signs involving the circulatory and respiratory systems: Secondary | ICD-10-CM

## 2015-12-01 DIAGNOSIS — Z0189 Encounter for other specified special examinations: Secondary | ICD-10-CM

## 2015-12-01 DIAGNOSIS — R062 Wheezing: Secondary | ICD-10-CM

## 2015-12-01 DIAGNOSIS — Z9225 Personal history of immunosupression therapy: Secondary | ICD-10-CM

## 2015-12-01 DIAGNOSIS — E872 Acidosis: Secondary | ICD-10-CM | POA: Diagnosis present

## 2015-12-01 DIAGNOSIS — E860 Dehydration: Secondary | ICD-10-CM | POA: Diagnosis present

## 2015-12-01 DIAGNOSIS — K5090 Crohn's disease, unspecified, without complications: Secondary | ICD-10-CM | POA: Diagnosis present

## 2015-12-01 DIAGNOSIS — G40409 Other generalized epilepsy and epileptic syndromes, not intractable, without status epilepticus: Secondary | ICD-10-CM | POA: Diagnosis present

## 2015-12-01 DIAGNOSIS — K50013 Crohn's disease of small intestine with fistula: Secondary | ICD-10-CM | POA: Diagnosis not present

## 2015-12-01 DIAGNOSIS — R1033 Periumbilical pain: Secondary | ICD-10-CM

## 2015-12-01 HISTORY — DX: Syncope and collapse: R55

## 2015-12-01 HISTORY — DX: Crohn's disease of large intestine without complications: K50.10

## 2015-12-01 HISTORY — DX: Unspecified intestinal obstruction, unspecified as to partial versus complete obstruction: K56.609

## 2015-12-01 HISTORY — DX: Acquired absence of other specified parts of digestive tract: Z90.49

## 2015-12-01 LAB — URINALYSIS, ROUTINE W REFLEX MICROSCOPIC
Glucose, UA: NEGATIVE mg/dL
HGB URINE DIPSTICK: NEGATIVE
Ketones, ur: 15 mg/dL — AB
NITRITE: POSITIVE — AB
Protein, ur: 100 mg/dL — AB
SPECIFIC GRAVITY, URINE: 1.028 (ref 1.005–1.030)
pH: 5 (ref 5.0–8.0)

## 2015-12-01 LAB — URINE MICROSCOPIC-ADD ON

## 2015-12-01 LAB — CBC WITH DIFFERENTIAL/PLATELET
BASOS ABS: 0 10*3/uL (ref 0.0–0.1)
BASOS PCT: 0 %
Eosinophils Absolute: 0 10*3/uL (ref 0.0–0.7)
Eosinophils Relative: 0 %
HEMATOCRIT: 56.7 % — AB (ref 39.0–52.0)
HEMOGLOBIN: 18.8 g/dL — AB (ref 13.0–17.0)
LYMPHS PCT: 5 %
Lymphs Abs: 0.4 10*3/uL — ABNORMAL LOW (ref 0.7–4.0)
MCH: 36.3 pg — ABNORMAL HIGH (ref 26.0–34.0)
MCHC: 33.2 g/dL (ref 30.0–36.0)
MCV: 109.5 fL — AB (ref 78.0–100.0)
Monocytes Absolute: 0.4 10*3/uL (ref 0.1–1.0)
Monocytes Relative: 5 %
NEUTROS ABS: 7.7 10*3/uL (ref 1.7–7.7)
NEUTROS PCT: 91 %
PLATELETS: 308 10*3/uL (ref 150–400)
RBC: 5.18 MIL/uL (ref 4.22–5.81)
RDW: 13.6 % (ref 11.5–15.5)
WBC: 8.5 10*3/uL (ref 4.0–10.5)

## 2015-12-01 LAB — I-STAT CG4 LACTIC ACID, ED: LACTIC ACID, VENOUS: 5.64 mmol/L — AB (ref 0.5–2.0)

## 2015-12-01 LAB — COMPREHENSIVE METABOLIC PANEL
ALT: 21 U/L (ref 17–63)
AST: 39 U/L (ref 15–41)
Albumin: 3.9 g/dL (ref 3.5–5.0)
Alkaline Phosphatase: 106 U/L (ref 38–126)
Anion gap: 16 — ABNORMAL HIGH (ref 5–15)
BUN: 26 mg/dL — ABNORMAL HIGH (ref 6–20)
CO2: 25 mmol/L (ref 22–32)
CREATININE: 2.28 mg/dL — AB (ref 0.61–1.24)
Calcium: 8.9 mg/dL (ref 8.9–10.3)
Chloride: 98 mmol/L — ABNORMAL LOW (ref 101–111)
GFR calc non Af Amer: 29 mL/min — ABNORMAL LOW (ref >60)
GFR, EST AFRICAN AMERICAN: 34 mL/min — AB (ref >60)
GLUCOSE: 123 mg/dL — AB (ref 65–99)
Potassium: 4.2 mmol/L (ref 3.5–5.1)
Sodium: 139 mmol/L (ref 135–145)
Total Bilirubin: 1.3 mg/dL — ABNORMAL HIGH (ref 0.3–1.2)
Total Protein: 7.1 g/dL (ref 6.5–8.1)

## 2015-12-01 LAB — LIPASE, BLOOD: Lipase: 26 U/L (ref 11–51)

## 2015-12-01 MED ORDER — LACTATED RINGERS IV BOLUS (SEPSIS)
2000.0000 mL | Freq: Once | INTRAVENOUS | Status: AC
  Administered 2015-12-01: 2000 mL via INTRAVENOUS

## 2015-12-01 MED ORDER — DIPHENHYDRAMINE HCL 50 MG/ML IJ SOLN
12.5000 mg | Freq: Four times a day (QID) | INTRAMUSCULAR | Status: DC | PRN
  Administered 2015-12-04: 12.5 mg via INTRAVENOUS
  Administered 2015-12-12: 25 mg via INTRAVENOUS
  Filled 2015-12-01 (×3): qty 1

## 2015-12-01 MED ORDER — MENTHOL 3 MG MT LOZG
1.0000 | LOZENGE | OROMUCOSAL | Status: DC | PRN
  Filled 2015-12-01: qty 9

## 2015-12-01 MED ORDER — LIP MEDEX EX OINT
1.0000 "application " | TOPICAL_OINTMENT | Freq: Two times a day (BID) | CUTANEOUS | Status: DC
  Administered 2015-12-01 – 2015-12-13 (×23): 1 via TOPICAL
  Filled 2015-12-01 (×5): qty 7

## 2015-12-01 MED ORDER — BISACODYL 10 MG RE SUPP: 10.0000 mg | Freq: Two times a day (BID) | RECTAL | Status: DC | PRN

## 2015-12-01 MED ORDER — LACTATED RINGERS IV BOLUS (SEPSIS): 1000.0000 mL | Freq: Three times a day (TID) | INTRAVENOUS | Status: DC | PRN

## 2015-12-01 MED ORDER — PROMETHAZINE HCL 25 MG/ML IJ SOLN
6.2500 mg | INTRAMUSCULAR | Status: DC | PRN
  Filled 2015-12-01: qty 1

## 2015-12-01 MED ORDER — ALUM & MAG HYDROXIDE-SIMETH 200-200-20 MG/5ML PO SUSP: 30.0000 mL | Freq: Four times a day (QID) | ORAL | Status: DC | PRN

## 2015-12-01 MED ORDER — SODIUM CHLORIDE 0.9 % IV SOLN
INTRAVENOUS | Status: DC
  Administered 2015-12-01: 23:00:00 via INTRAVENOUS

## 2015-12-01 MED ORDER — ONDANSETRON HCL 4 MG/2ML IJ SOLN: 4.0000 mg | Freq: Four times a day (QID) | INTRAMUSCULAR | Status: DC | PRN

## 2015-12-01 MED ORDER — DIATRIZOATE MEGLUMINE & SODIUM 66-10 % PO SOLN
90.0000 mL | Freq: Once | ORAL | Status: AC
  Administered 2015-12-01: 90 mL via NASOGASTRIC
  Filled 2015-12-01: qty 90

## 2015-12-01 MED ORDER — MAGIC MOUTHWASH
15.0000 mL | Freq: Four times a day (QID) | ORAL | Status: DC | PRN
  Filled 2015-12-01: qty 15

## 2015-12-01 MED ORDER — SODIUM CHLORIDE 0.9 % IV BOLUS (SEPSIS)
1000.0000 mL | Freq: Once | INTRAVENOUS | Status: AC
  Administered 2015-12-01: 1000 mL via INTRAVENOUS

## 2015-12-01 MED ORDER — PHENOL 1.4 % MT LIQD
2.0000 | OROMUCOSAL | Status: DC | PRN
  Administered 2015-12-02 (×2): 2 via OROMUCOSAL

## 2015-12-01 MED ORDER — ONDANSETRON HCL 40 MG/20ML IJ SOLN
8.0000 mg | Freq: Four times a day (QID) | INTRAMUSCULAR | Status: DC | PRN
  Filled 2015-12-01: qty 4

## 2015-12-01 MED ORDER — ACETAMINOPHEN 650 MG RE SUPP: 650.0000 mg | Freq: Four times a day (QID) | RECTAL | Status: DC | PRN

## 2015-12-01 NOTE — Consult Note (Signed)
[facility]  [address] [place], [place] [address] Phone: [phone] FAX: [place]  05-30-1956 [id number]  CARE TEAM:  PCP: No PCP Per Patient  Outpatient Care Team: Patient Care Team: No Pcp Per Patient as PCP - General ([place]) Jerene Bears, MD as Consulting Physician (Gastroenterology) Cameron Sprang, MD as Consulting Physician (Neurology)  Inpatient Treatment Team: Treatment Team: Attending Provider: Milton Ferguson, MD; Physician Assistant: Renold Don; Student Nurse: Gracy Racer, Student-RN; Technician: Colonel Bald, NT; Registered Nurse: Zadie Cleverly, RN; Consulting Physician: Nolon Nations, MD  This patient is a 60 y.o.male who presents today for surgical evaluation at the request of Harlene Ramus & Dr Dewayne Hatch, [facility] ED.   Reason for evaluation: SBO  Pleasant gentleman with history of seizure disorder.  Questionable developmental delay with disability.  Crohn's disease.  On sulfasalazine.  Has been on immunosuppression the past.  6MP.  Followed by [facility] gastroenterology.  Last colonoscopy January 2016.  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  Anastomosis 45 cm from the anal verge according to most recent colonoscopy last year with anastomotic stricture.  Also had umbilical hernia repair and possible incisional hernia repairs.  No operations since the 1980s.  Patient is at assisted living I believe.  He has had worsening nausea and vomiting.   seemed to start a few days ago X-ray done at the facility concerning for bowel obstruction.  Sent to the emergency room.  Patient came in tachycardic.  Elevated creatinine.  No urine output.  Hydrated with IV fluids.  Feeling a little better.  Denies severe nausea nor any emesis in the emergency room but hiccuping.  CT scan shows evidence of bowel obstruction.  Seems high-grade.  Seems in the jejunum.  Surgical  consultation requested.  Patient's cousin is here as well as emergency room staff.  At the bedside.  Patient denies any abdominal pain.  Denies much flatus.  No bowel movements for a few days.  No personal nor family history of GI/colon cancer, irritable bowel syndrome, allergy such as Celiac Sprue, dietary/dairy problems, ulcers nor gastritis.  No recent sick contacts/gastroenteritis.  No travel outside the country.  No changes in diet.  No dysphagia to solids or liquids.  No significant heartburn or reflux.  No hematochezia, hematemesis, coffee ground emesis.  No evidence of prior gastric/peptic ulceration.    Past Medical History  Diagnosis Date  . Other B-complex deficiencies   . Iron deficiency anemia, unspecified   . Abscess of anal and rectal regions   . Crohn's disease ([place])   . Mental disability   . Heart murmur   . History of blood transfusion ~ 1992    "related to my Crohn's"  . History of stomach ulcers   . Epilepsy ([place])     "petit mal; only at home" (6/9//2015)  . Syncope and collapse 03/31/2014    "while driving"  . Hyperplastic colon polyp   . REGIONAL ENTERITIS, LARGE INTESTINE 10/05/2000    Qualifier: Diagnosis of  By: Jerral Ralph    . Syncope 03/31/2014  . 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 Hemicolectomy  1985    ileocectomy   . Colonoscopy  2013, 2016  . Umbilical hernia repair  ~ 1983  . Tonsillectomy  ~  1967  . Hernia repair    . Colon surgery      Anastomosis 45cm from anal verge c/w deswecending colon anastomosis  . Pilonidal cyst excision  1980's    Social History   Social History  . Marital Status: Single    Spouse Name: N/A  . Number of Children: 0  . Years of Education: N/A   Occupational History  . Disabled    Social History Main Topics  . Smoking status: Never Smoker   . Smokeless tobacco: Never Used  . Alcohol Use: No  .  Drug Use: No  . Sexual Activity: No   Other Topics Concern  . Not on file   Social History Narrative   Lives at [facility]    Family History  Problem Relation Age of Onset  . Skin cancer Father   . Seizures Neg Hx   . Crohn's disease Neg Hx     Current Facility-Administered Medications  Medication Dose Route Frequency Provider Last Rate Last Dose  . acetaminophen (TYLENOL) suppository 650 mg  650 mg Rectal Q6H PRN Michael Boston, MD      . alum & mag hydroxide-simeth (MAALOX/MYLANTA) 200-200-20 MG/5ML suspension 30 mL  30 mL Oral Q6H PRN Michael Boston, MD      . bisacodyl (DULCOLAX) suppository 10 mg  10 mg Rectal Q12H PRN Michael Boston, MD      . diatrizoate meglumine-sodium (GASTROGRAFIN) 66-10 % solution 90 mL  90 mL Per NG tube Once Michael Boston, MD      . diphenhydrAMINE (BENADRYL) injection 12.5-25 mg  12.5-25 mg Intravenous Q6H PRN Michael Boston, MD      . lactated ringers bolus 1,000 mL  1,000 mL Intravenous Q8H PRN Michael Boston, MD      . lactated ringers bolus 2,000 mL  2,000 mL Intravenous Once Michael Boston, MD      . lip balm (CARMEX) ointment 1 application  1 application Topical BID Michael Boston, MD      . magic mouthwash  15 mL Oral QID PRN Michael Boston, MD      . menthol-cetylpyridinium (CEPACOL) lozenge 3 mg  1 lozenge Oral PRN Michael Boston, MD      . ondansetron [facility]) injection 4 mg  4 mg Intravenous Q6H PRN Michael Boston, MD       Or  . ondansetron (ZOFRAN) 8 mg in sodium chloride 0.9 % 50 mL IVPB  8 mg Intravenous Q6H PRN Michael Boston, MD      . phenol (CHLORASEPTIC) mouth spray 2 spray  2 spray Mouth/Throat PRN Michael Boston, MD      . promethazine (PHENERGAN) injection 6.25-12.5 mg  6.25-12.5 mg Intravenous Q4H PRN Michael Boston, MD       Current Outpatient Prescriptions  Medication Sig Dispense Refill  . folic acid (FOLVITE) 1 MG tablet Take 1 mg by mouth daily.    [name] levETIRAcetam (KEPPRA) 500 MG tablet Take 1 tablet (500 mg total) by mouth 2 (two) times daily.  180 tablet 3  . mercaptopurine (PURINETHOL) 50 MG tablet TAKE 1 TABLET BY MOUTH EVERY DAY GIVE ON AN EMPTY STOMACH 1 HOUR BEFORE OR 2 HOURS AFTER MEALS 90 tablet 0  . primidone (MYSOLINE) 250 MG tablet Take 2 tabs twice a day 360 tablet 3  . sulfaSALAzine (AZULFIDINE) 500 MG tablet TAKE 1 TABLET (500 MG TOTAL) BY MOUTH DAILY. 90 tablet 1  . loratadine (CLARITIN) 10 MG tablet Take 10 mg by mouth daily as needed for allergies.  No Known Allergies  ROS: Constitutional:  No fevers, chills, sweats.  Weight stable Eyes:  No vision changes, No discharge HENT:  No sore throats, nasal drainage Lymph: No neck swelling, No bruising easily Pulmonary:  No cough, productive sputum CV: No orthopnea, PND  Patient walks in hallways.  No exertional chest/neck/shoulder/arm pain. GI:  No personal nor family history of GI/colon cancer, irritable bowel syndrome, allergy such as Celiac Sprue, dietary/dairy problems, colitis, ulcers nor gastritis.  No recent sick contacts/gastroenteritis.  No travel outside the country.  No changes in diet. Renal: No UTIs, No hematuria.  Little UOP Genital:  No drainage, bleeding, masses Musculoskeletal: No severe joint pain.  Good ROM major joints Skin:  No sores or lesions.  No rashes Heme/Lymph:  No easy bleeding.  No swollen lymph nodes Neuro: No focal weakness/numbness.  No seizures Psych: No suicidal ideation.  No hallucinations  BP 111/56 mmHg  Pulse 121  Temp(Src) 98.4 F (36.9 C) (Oral)  Resp 18  SpO2 92%  Physical Exam: General: Pt awake/alert/oriented x4 in no major acute distress.  Feels warm but T 98.38F Eyes: PERRL, normal EOM. Sclera nonicteric Neuro: CN II-XII intact w/o focal sensory/motor deficits. Lymph: No head/neck/groin lymphadenopathy Psych:  No delerium/psychosis/paranoia.  Answers somewhat slowly and simply but comprehension appears pretty good.  Speech level pretty good HENT: Normocephalic, Mucus membranes moist.  No thrush Neck: Supple,  No tracheal deviation Chest: No pain.  Good respiratory excursion. CV:  Pulses intact.  Regular rhythm.  HR 100-110 Abdomen: Soft, Moderately distended.   periumbilical midline incision without hernia.  No diastasis.  Nontender.  No  Hernias. GU:  Normal external male genitalia.  No inguinal hernias.   Ext:  SCDs BLE.  No significant edema.  No cyanosis Skin: No petechiae / purpurea.  No major sores Musculoskeletal: No severe joint pain.  Good ROM major joints   Results:   Labs: Results for orders placed or performed during the hospital encounter of 12/01/15 (from the past 48 hour(s))  CBC with Differential     Status: Abnormal   Collection Time: 12/01/15  4:32 PM  Result Value Ref Range   WBC 8.5 4.0 - 10.5 K/uL   RBC 5.18 4.22 - 5.81 MIL/uL   Hemoglobin 18.8 (H) 13.0 - 17.0 g/dL   HCT 56.7 (H) 39.0 - 52.0 %   MCV 109.5 (H) 78.0 - 100.0 fL   MCH 36.3 (H) 26.0 - 34.0 pg   MCHC 33.2 30.0 - 36.0 g/dL   RDW 13.6 11.5 - 15.5 %   Platelets 308 150 - 400 K/uL   Neutrophils Relative % 91 %   Neutro Abs 7.7 1.7 - 7.7 K/uL   Lymphocytes Relative 5 %   Lymphs Abs 0.4 (L) 0.7 - 4.0 K/uL   Monocytes Relative 5 %   Monocytes Absolute 0.4 0.1 - 1.0 K/uL   Eosinophils Relative 0 %   Eosinophils Absolute 0.0 0.0 - 0.7 K/uL   Basophils Relative 0 %   Basophils Absolute 0.0 0.0 - 0.1 K/uL  Comprehensive metabolic panel     Status: Abnormal   Collection Time: 12/01/15  4:32 PM  Result Value Ref Range   Sodium 139 135 - 145 mmol/L   Potassium 4.2 3.5 - 5.1 mmol/L   Chloride 98 (L) 101 - 111 mmol/L   CO2 25 22 - 32 mmol/L   Glucose, Bld 123 (H) 65 - 99 mg/dL   BUN 26 (H) 6 - 20 mg/dL   Creatinine, Ser 2.28 (  H) 0.61 - 1.24 mg/dL   Calcium 8.9 8.9 - 10.3 mg/dL   Total Protein 7.1 6.5 - 8.1 g/dL   Albumin 3.9 3.5 - 5.0 g/dL   AST 39 15 - 41 U/L   ALT 21 17 - 63 U/L    Comment: RESULTS CONFIRMED BY MANUAL DILUTION   Alkaline Phosphatase 106 38 - 126 U/L   Total Bilirubin 1.3 (H) 0.3 - 1.2  mg/dL   GFR calc non Af Amer 29 (L) >60 mL/min   GFR calc Af Amer 34 (L) >60 mL/min    Comment: (NOTE) The eGFR has been calculated using the CKD EPI equation. This calculation has not been validated in all clinical situations. eGFR's persistently <60 mL/min signify possible Chronic Kidney Disease.    Anion gap 16 (H) 5 - 15  Lipase, blood     Status: None   Collection Time: 12/01/15  4:32 PM  Result Value Ref Range   Lipase 26 11 - 51 U/L    Imaging / Studies: Ct Abdomen Pelvis Wo Contrast  12/01/2015  CLINICAL DATA:  Upper abdominal discomfort made worse by eating. EXAM: CT ABDOMEN AND PELVIS WITHOUT CONTRAST TECHNIQUE: Multidetector CT imaging of the abdomen and pelvis was performed following the standard protocol without IV contrast. COMPARISON:  Multiple priors, most recent 01/05/2015. FINDINGS: Lower chest: BILATERAL lower lobe opacities, likely aspiration secondary to nausea and vomiting. Hepatobiliary: No mass visualized on this un-enhanced exam. Gallstones. Pancreas: No mass or inflammatory process identified on this un-enhanced exam. Spleen: Within normal limits in size. Adrenals/Urinary Tract: No evidence of urolithiasis or hydronephrosis. No definite mass visualized on this un-enhanced exam. Stomach/Bowel: Markedly distended stomach, and small bowel. Partial/RIGHT hemicolectomy. Severe distal small bowel obstruction. Transition zone LEFT mid abdomen, likely secondary to adhesions. Free fluid in the pelvis without signs of pneumatosis or perforation. Vascular/Lymphatic: No pathologically enlarged lymph nodes. No evidence of abdominal aortic aneurysm. Reproductive: No mass or other significant abnormality. Normal prostate. Musculoskeletal:  No suspicious bone lesions identified. IMPRESSION: High-grade small bowel obstruction, likely due to adhesions. No pneumoperitoneum. Small amount of free fluid pelvis. Previous partial colectomy. BILATERAL lower lobe opacities, likely aspiration for  nausea and vomiting. Gallstones. Electronically Signed   By: Staci Righter M.D.   On: 12/01/2015 18:51    Medications / Allergies: per chart  Antibiotics: Anti-infectives    None      Assessment  [name]  60 y.o. male       Problem List:  Principal Problem:   SBO (small bowel obstruction) (HCC) Active Problems:   Crohn's disease ([place])   Personal history of immunosupression therapy   Seizure disorder ([place])   S/P partial colectomy for Crohn's disease   ARF (acute renal failure) ([place])   Shock ([place])    Small bowel obstruction by history and physical and CT scan.  Could be from Crohn's flare but most likely from adhesions.  Acute renal failure with decreased urine output.  Tachycardia suspicious for early shock.    Plan:  -Medical admission given numerous health issues and complexities and prior admissions by them.  We will follow surgically.  Small bowel protocol with nasogastric tube and x-ray studies and exam to see if this will resolve nonoperatively.  Strongly consider gastroenterology reevaluation to see if this can be reviewed Crohn's flare.  I do not see much inflammation at the transition zone but would be a concern given history of prior fistulas.  He does not seem to be more aggressively immunosuppressed  this time.  Has not been seen in over a year.  Good opportunity for reevaluation.  If he medically declines or does not improve nonoperatively, may require lysis adhesions and possible bowel resection for bowel obstruction.  Aggressively rehydrate for his renal failure.  Tachycardia improved already.    -VTE prophylaxis- SCDs, etc  -mobilize as tolerated to help recovery  The patient is stabilizing.  There is no evidence of peritonitis, acute abdomen.  There is no strong evidence of failure of improvement nor decline with current non-operative management.  There is no need for surgery at the present moment.  I updated the status of the patient to the  patient's cousin at the bedside.  I made recommendations.  I answered questions.  Understanding & appreciation was expressed.We will continue to follow.     Adin Hector, M.D., F.A.C.S. Gastrointestinal and Minimally Invasive Surgery [facility] [address] [place], [place] [address] [phone] Main / Paging   12/01/2015  Note: Portions of this report may have been transcribed using voice recognition software. Every effort was made to ensure accuracy; however, inadvertent computerized transcription errors may be present.   Any transcriptional errors that result from this process are unintentional.

## 2015-12-01 NOTE — ED Provider Notes (Signed)
CSN: [id number]     Arrival date & time 12/01/15  1544 History   First MD Initiated Contact with Patient 12/01/15 1549     Chief Complaint  Patient presents with  . Abdominal Pain     (Consider location/radiation/quality/duration/timing/severity/associated sxs/prior Treatment) Patient is a 60 y.o. male presenting with abdominal pain.  Abdominal Pain Associated symptoms: nausea and vomiting    Pt is a 60 yo male with PMH of Crohn's disease and Epilepsy who presents to the ED via EMS from [facility] with complaint of abdominal pain, onset 2 days. Pt reports having constant pressure sensation to his epigastric region that started after eating lunch two days ago. He notes he has 2 episodes of NBNB vomiting yesterday and today but notes his abdominal pain spontaneously resolved this afternoon around 3pm. He notes he also no longer feels nauseous. He notes he had a KUB done this afternoon at [facility] which was concerning for small bowel obstruction resulting in him coming to the ED. Pt reports his last bowel movement was last night and notes it was normal, nonbloody. Denies fever, chills, headache, sore throat, chest pain, SOB, hematemesis, diarrhea, constipation, urinary sxs. He notes he last had something to eat last night at dinner. Surgical hx includes hemicolectomy which was done in 1985.   Past Medical History  Diagnosis Date  . Other B-complex deficiencies   . Iron deficiency anemia, unspecified   . Abscess of anal and rectal regions   . Crohn's disease ([place])   . Mental disability   . Heart murmur   . History of blood transfusion ~ 1992    "related to my Crohn's"  . History of stomach ulcers   . Epilepsy ([place])     "petit mal; only at home" (6/9//2015)  . Syncope and collapse 03/31/2014    "while driving"  . Hyperplastic colon polyp   . REGIONAL ENTERITIS, LARGE INTESTINE 10/05/2000    Qualifier: Diagnosis of  By: Jerral Ralph    . Syncope 03/31/2014  . FISTULA, INTESTINE  01/29/2008    Qualifier: Diagnosis of  By: Jerral Ralph     Past Surgical History  Procedure Laterality Date  . Hemicolectomy  1985    ileocectomy   . Colonoscopy  2013, 2016  . Umbilical hernia repair  ~ 1983  . Tonsillectomy  ~ 1967  . Hernia repair    . Colon surgery      Anastomosis 45cm from anal verge c/w deswecending colon anastomosis  . Pilonidal cyst excision  1980's   Family History  Problem Relation Age of Onset  . Skin cancer Father   . Seizures Neg Hx   . Crohn's disease Neg Hx    Social History  Substance Use Topics  . Smoking status: Never Smoker   . Smokeless tobacco: Never Used  . Alcohol Use: No    Review of Systems  Gastrointestinal: Positive for nausea, vomiting and abdominal pain.  All other systems reviewed and are negative.     Allergies  Review of patient's allergies indicates no known allergies.  Home Medications   Prior to Admission medications   Medication Sig Start Date End Date Taking? Authorizing Provider  folic acid (FOLVITE) 1 MG tablet Take 1 mg by mouth daily.   Yes Historical Provider, MD  levETIRAcetam (KEPPRA) 500 MG tablet Take 1 tablet (500 mg total) by mouth 2 (two) times daily. 09/29/15  Yes Cameron Sprang, MD  mercaptopurine (PURINETHOL) 50 MG tablet TAKE 1 TABLET BY MOUTH EVERY DAY  GIVE ON AN EMPTY STOMACH 1 HOUR BEFORE OR 2 HOURS AFTER MEALS 09/15/15  Yes Jerene Bears, MD  primidone (MYSOLINE) 250 MG tablet Take 2 tabs twice a day 09/29/15  Yes Cameron Sprang, MD  sulfaSALAzine (AZULFIDINE) 500 MG tablet TAKE 1 TABLET (500 MG TOTAL) BY MOUTH DAILY. 05/05/15  Yes Lafayette Dragon, MD  loratadine (CLARITIN) 10 MG tablet Take 10 mg by mouth daily as needed for allergies.    Historical Provider, MD   BP 107/64 mmHg  Pulse 121  Temp(Src) 98.2 F (36.8 C) (Oral)  Resp 29  SpO2 97% Physical Exam  Constitutional: He is oriented to person, place, and time. He appears well-developed and well-nourished. No distress.  HENT:   Head: Normocephalic and atraumatic.  Mouth/Throat: Uvula is midline and oropharynx is clear and moist. Mucous membranes are dry. No oropharyngeal exudate.  Eyes: Conjunctivae and EOM are normal. Right eye exhibits no discharge. Left eye exhibits no discharge. No scleral icterus.  Neck: Normal range of motion. Neck supple.  Cardiovascular: Regular rhythm, normal heart sounds and intact distal pulses.   Tachycardic, HR 112.  Pulmonary/Chest: Effort normal and breath sounds normal. No respiratory distress. He has no wheezes. He has no rales. He exhibits no tenderness.  Abdominal: Soft. Bowel sounds are normal. He exhibits no distension and no mass. There is tenderness (mild tenderness in RLQ and suprapubic region). There is no rebound and no guarding.  Musculoskeletal: Normal range of motion. He exhibits no edema.  Lymphadenopathy:    He has no cervical adenopathy.  Neurological: He is alert and oriented to person, place, and time.  Skin: Skin is warm and dry. He is not diaphoretic.  Nursing note and vitals reviewed.   ED Course  Procedures (including critical care time) Labs Review Labs Reviewed  CBC WITH DIFFERENTIAL/PLATELET - Abnormal; Notable for the following:    Hemoglobin 18.8 (*)    HCT 56.7 (*)    MCV 109.5 (*)    MCH 36.3 (*)    Lymphs Abs 0.4 (*)    All other components within normal limits  COMPREHENSIVE METABOLIC PANEL - Abnormal; Notable for the following:    Chloride 98 (*)    Glucose, Bld 123 (*)    BUN 26 (*)    Creatinine, Ser 2.28 (*)    Total Bilirubin 1.3 (*)    GFR calc non Af Amer 29 (*)    GFR calc Af Amer 34 (*)    Anion gap 16 (*)    All other components within normal limits  URINALYSIS, ROUTINE W REFLEX MICROSCOPIC (NOT AT [facility]) - Abnormal; Notable for the following:    Color, Urine ORANGE (*)    APPearance CLOUDY (*)    Bilirubin Urine LARGE (*)    Ketones, ur 15 (*)    Protein, ur 100 (*)    Nitrite POSITIVE (*)    Leukocytes, UA SMALL (*)     All other components within normal limits  LACTIC ACID, PLASMA - Abnormal; Notable for the following:    Lactic Acid, Venous 5.8 (*)    All other components within normal limits  URINE MICROSCOPIC-ADD ON - Abnormal; Notable for the following:    Squamous Epithelial / LPF 6-30 (*)    Bacteria, UA MANY (*)    Casts GRANULAR CAST (*)    All other components within normal limits  I-STAT CG4 LACTIC ACID, ED - Abnormal; Notable for the following:    Lactic Acid, Venous 5.64 (*)  All other components within normal limits  LIPASE, BLOOD    Imaging Review Ct Abdomen Pelvis Wo Contrast  12/01/2015  CLINICAL DATA:  Upper abdominal discomfort made worse by eating. EXAM: CT ABDOMEN AND PELVIS WITHOUT CONTRAST TECHNIQUE: Multidetector CT imaging of the abdomen and pelvis was performed following the standard protocol without IV contrast. COMPARISON:  Multiple priors, most recent 01/05/2015. FINDINGS: Lower chest: BILATERAL lower lobe opacities, likely aspiration secondary to nausea and vomiting. Hepatobiliary: No mass visualized on this un-enhanced exam. Gallstones. Pancreas: No mass or inflammatory process identified on this un-enhanced exam. Spleen: Within normal limits in size. Adrenals/Urinary Tract: No evidence of urolithiasis or hydronephrosis. No definite mass visualized on this un-enhanced exam. Stomach/Bowel: Markedly distended stomach, and small bowel. Partial/RIGHT hemicolectomy. Severe distal small bowel obstruction. Transition zone LEFT mid abdomen, likely secondary to adhesions. Free fluid in the pelvis without signs of pneumatosis or perforation. Vascular/Lymphatic: No pathologically enlarged lymph nodes. No evidence of abdominal aortic aneurysm. Reproductive: No mass or other significant abnormality. Normal prostate. Musculoskeletal:  No suspicious bone lesions identified. IMPRESSION: High-grade small bowel obstruction, likely due to adhesions. No pneumoperitoneum. Small amount of free fluid  pelvis. Previous partial colectomy. BILATERAL lower lobe opacities, likely aspiration for nausea and vomiting. Gallstones. Electronically Signed   By: Staci Righter M.D.   On: 12/01/2015 18:51   Dg Abd Portable 1v-small Bowel Protocol-position Verification  12/01/2015  CLINICAL DATA:  Small bowel protocol, evaluate NGT. EXAM: PORTABLE ABDOMEN - 1 VIEW COMPARISON:  12/01/2015. FINDINGS: A gastric tube has been inserted. The last side hole is near the gastroesophageal junction. The tip of the tube lies within the stomach. The tube could be advanced several cm. There is marked gaseous distention of the stomach and small bowel. IMPRESSION: Nasogastric tube tip lies within the stomach but could be advanced several cm. Electronically Signed   By: Staci Righter M.D.   On: 12/01/2015 21:52   I have personally reviewed and evaluated these images and lab results as part of my medical decision-making.  Filed Vitals:   12/01/15 2117 12/01/15 2250  BP: 100/55 107/64  Pulse:    Temp:    Resp:  29     MDM   Final diagnoses:  Abdominal pain  Encounter for imaging study to confirm nasogastric (NG) tube placement  Small bowel obstruction ([place])    Patient presents with abdominal pain, nausea and vomiting. Patient is a resident at [facility] and KUB performed at that facility concerning for SBO. Since arrival to the ED patient denies having any pain or complaints at this time. Hx of Crohns (s/p hemicolectomy). Afebrile, HR 130, initial BP 88/55, O2 94% on RA. Exam revealed dry mucous membranes and mild tenderness and right lower quadrant suprapubic region, no peritoneal signs, no distention. Patient given IV fluids. Hgb 18.8. BUN 26, Cr 2.28. Lactic acid 5.64. CT abdomen and pelvis revealed high-grade small bowel extraction small amount of free fluid in pelvis. Pt's HR dec to 110bpm and BP improved to 111/56 s/p IVF. ON reevaluation, pt's O2 was 88% on RA, pt placed on 2L [place]. Consulted general surgery, Dr. Johney Maine  advised that he will consult pt but advised to have pt admitted by medicine. Consulted Hospitalist. Dr. Hal Hope agrees to admission. NG tube placed in the ED and pt give another 1L of IVF in the ED. Orders placed for stepdown bed. Discussed results and plan for d/c with pt.     Chesley Noon [place], [place] 12/02/15 5329  Milton Ferguson, MD 12/02/15  1058 

## 2015-12-01 NOTE — ED Notes (Signed)
He states he had some upper abd. Discomfort made worse by eating yesterday.  Today he has had a few episodes of n/v.  He was x-rayed today (KUB) which was concerning, according to a note for S.B.O.  He is awake, alert and in no distress.

## 2015-12-01 NOTE — ED Notes (Signed)
Bed: FM38 Expected date:  Expected time:  Means of arrival:  Comments: EMS- small bowel obstruction

## 2015-12-01 NOTE — ED Notes (Signed)
Pt stated that he cannot urinate.  Pt stated that he would try after getting fluids.

## 2015-12-01 NOTE — H&P (Addendum)
[facility] History and Physical  [name] AST:[id number] DOB: [birth year] DOA: 12/01/2015  Referring physician: Ms.Nadeau. PA. PCP: No PCP Per Patient  Specialists: Dr.Aquino. Neurologist.  Chief Complaint: Nausea vomiting.  HPI: [name] is a 60 y.o. male with history of seizures, Crohn's disease, mild mental cognitive defect was brought to the ER after patient had multiple episodes of nausea vomiting. As per patient patient had a normal bowel movement 24 hours ago following which patient started developing abdominal pain started off in the lower abdomen. Following which patient had multiple episodes of nausea and vomiting. Patient presented later today to the ER. CT abdomen and pelvis shows high-grade small bowel obstruction. On-call surgeon Dr. Johney Maine was consulted and patient had NG tube placed with at least 500 mL of dark fluid being suctioned. In addition patient has not making much urine since morning. Creatinine is elevated. Patient is also in the low blood pressure range with tachycardia for which patient has been given 3 L of fluid. Patient is being admitted for small bowel obstruction with SIRS. Patient denies any chest pain shortness of breath fever chills. UA is pending.  Review of Systems: As presented in the history of presenting illness, rest negative.  Past Medical History  Diagnosis Date  . Other B-complex deficiencies   . Iron deficiency anemia, unspecified   . Abscess of anal and rectal regions   . Crohn's disease ([place])   . Mental disability   . Heart murmur   . History of blood transfusion ~ 1992    "related to my Crohn's"  . History of stomach ulcers   . Epilepsy ([place])     "petit mal; only at home" (6/9//2015)  . Syncope and collapse 03/31/2014    "while driving"  . Hyperplastic colon polyp   . REGIONAL ENTERITIS, LARGE INTESTINE 10/05/2000    Qualifier: Diagnosis of  By: Jerral Ralph    . Syncope 03/31/2014  . FISTULA, INTESTINE  01/29/2008    Qualifier: Diagnosis of  By: Jerral Ralph     Past Surgical History  Procedure Laterality Date  . Hemicolectomy  1985    ileocectomy   . Colonoscopy  2013, 2016  . Umbilical hernia repair  ~ 1983  . Tonsillectomy  ~ 1967  . Hernia repair    . Colon surgery      Anastomosis 45cm from anal verge c/w deswecending colon anastomosis  . Pilonidal cyst excision  1980's   Social History:  reports that he has never smoked. He has never used smokeless tobacco. He reports that he does not drink alcohol or use illicit drugs. Where does patient live nursing home. Can patient participate in ADLs? Yes.  No Known Allergies  Family History:  Family History  Problem Relation Age of Onset  . Skin cancer Father   . Seizures Neg Hx   . Crohn's disease Neg Hx       Prior to Admission medications   Medication Sig Start Date End Date Taking? Authorizing Provider  folic acid (FOLVITE) 1 MG tablet Take 1 mg by mouth daily.   Yes Historical Provider, MD  levETIRAcetam (KEPPRA) 500 MG tablet Take 1 tablet (500 mg total) by mouth 2 (two) times daily. 09/29/15  Yes Cameron Sprang, MD  mercaptopurine (PURINETHOL) 50 MG tablet TAKE 1 TABLET BY MOUTH EVERY DAY GIVE ON AN EMPTY STOMACH 1 HOUR BEFORE OR 2 HOURS AFTER MEALS 09/15/15  Yes Jerene Bears, MD  primidone (MYSOLINE) 250 MG tablet Take 2  tabs twice a day 09/29/15  Yes Cameron Sprang, MD  sulfaSALAzine (AZULFIDINE) 500 MG tablet TAKE 1 TABLET (500 MG TOTAL) BY MOUTH DAILY. 05/05/15  Yes Lafayette Dragon, MD  loratadine (CLARITIN) 10 MG tablet Take 10 mg by mouth daily as needed for allergies.    Historical Provider, MD    Physical Exam: Filed Vitals:   12/01/15 1847 12/01/15 2113 12/01/15 2117 12/01/15 2250  BP: 111/56 98/55 100/55 107/64  Pulse: 121     Temp:  98.2 F (36.8 C)    TempSrc:  Oral    Resp: 18 18  29   SpO2: 92%   97%     General:  Moderate labeled and nourished.  Eyes: Anicteric no pallor.  ENT: No discharge  from the ears eyes nose and mouth.  Neck: No mass felt. No neck rigidity.  Cardiovascular: S1-S2 heard.  Respiratory: No rhonchi or crepitations.  Abdomen: Soft nontender bowel sounds not appreciated. No guarding or rigidity.  Skin: No rash.  Musculoskeletal: No edema.  Psychiatric: Appears normal.  Neurologic: Alert awake oriented to time place and person. Moves all extremities.  Labs on Admission:  Basic Metabolic Panel:  Recent Labs Lab 12/01/15 1632  NA 139  K 4.2  CL 98*  CO2 25  GLUCOSE 123*  BUN 26*  CREATININE 2.28*  CALCIUM 8.9   Liver Function Tests:  Recent Labs Lab 12/01/15 1632  AST 39  ALT 21  ALKPHOS 106  BILITOT 1.3*  PROT 7.1  ALBUMIN 3.9    Recent Labs Lab 12/01/15 1632  LIPASE 26   No results for input(s): AMMONIA in the last 168 hours. CBC:  Recent Labs Lab 12/01/15 1632  WBC 8.5  NEUTROABS 7.7  HGB 18.8*  HCT 56.7*  MCV 109.5*  PLT 308   Cardiac Enzymes: No results for input(s): CKTOTAL, CKMB, CKMBINDEX, TROPONINI in the last 168 hours.  BNP (last 3 results) No results for input(s): BNP in the last 8760 hours.  ProBNP (last 3 results) No results for input(s): PROBNP in the last 8760 hours.  CBG: No results for input(s): GLUCAP in the last 168 hours.  Radiological Exams on Admission: Ct Abdomen Pelvis Wo Contrast  12/01/2015  CLINICAL DATA:  Upper abdominal discomfort made worse by eating. EXAM: CT ABDOMEN AND PELVIS WITHOUT CONTRAST TECHNIQUE: Multidetector CT imaging of the abdomen and pelvis was performed following the standard protocol without IV contrast. COMPARISON:  Multiple priors, most recent 01/05/2015. FINDINGS: Lower chest: BILATERAL lower lobe opacities, likely aspiration secondary to nausea and vomiting. Hepatobiliary: No mass visualized on this un-enhanced exam. Gallstones. Pancreas: No mass or inflammatory process identified on this un-enhanced exam. Spleen: Within normal limits in size. Adrenals/Urinary  Tract: No evidence of urolithiasis or hydronephrosis. No definite mass visualized on this un-enhanced exam. Stomach/Bowel: Markedly distended stomach, and small bowel. Partial/RIGHT hemicolectomy. Severe distal small bowel obstruction. Transition zone LEFT mid abdomen, likely secondary to adhesions. Free fluid in the pelvis without signs of pneumatosis or perforation. Vascular/Lymphatic: No pathologically enlarged lymph nodes. No evidence of abdominal aortic aneurysm. Reproductive: No mass or other significant abnormality. Normal prostate. Musculoskeletal:  No suspicious bone lesions identified. IMPRESSION: High-grade small bowel obstruction, likely due to adhesions. No pneumoperitoneum. Small amount of free fluid pelvis. Previous partial colectomy. BILATERAL lower lobe opacities, likely aspiration for nausea and vomiting. Gallstones. Electronically Signed   By: Staci Righter M.D.   On: 12/01/2015 18:51   Dg Abd Portable 1v-small Bowel Protocol-position Verification  12/01/2015  CLINICAL DATA:  Small bowel protocol, evaluate NGT. EXAM: PORTABLE ABDOMEN - 1 VIEW COMPARISON:  12/01/2015. FINDINGS: A gastric tube has been inserted. The last side hole is near the gastroesophageal junction. The tip of the tube lies within the stomach. The tube could be advanced several cm. There is marked gaseous distention of the stomach and small bowel. IMPRESSION: Nasogastric tube tip lies within the stomach but could be advanced several cm. Electronically Signed   By: Staci Righter M.D.   On: 12/01/2015 21:52    Assessment/Plan Principal Problem:   SIRS (systemic inflammatory response syndrome) (HCC) Active Problems:   Crohn's disease (HCC)   Personal history of immunosupression therapy   Seizure disorder (HCC)   SBO (small bowel obstruction) (HCC)   S/P partial colectomy for Crohn's disease   ARF (acute renal failure) ([place])   1. SIRS from small bowel obstruction - appreciate surgery consult. Patient is on NG tube  suction. Patient be kept nothing by mouth except medications. Abdominal x-rays in a.m. being ordered. Further recommendations per surgery. We need gastroenterology and put in a.m. Bowel obstruction cause could be either adhesions or Crohn's exacerbation. Patient's gastroenterologist is [name]. 2. Acute renal failure - probably from hypotension and dehydration from nausea vomiting. Aggressively hydrate. I have ordered Foley catheter and will check urine analysis. Check FENa. Closely monitor intake output and metabolic panel. 3. History of seizures - since patient's oral intake may not be reliable have placed patient's Keppra to IV. Continue primidone orally as possible. Addendum - discuss with neurologist Dr. Janann Colonel about patient being on primidone and patient cannot take reliably only at this time given the bowel obstruction. Dr. Janann Colonel has advised to increase patient's Keppra dose to 750 mg IV every 12 for now and once patient is back to orals to go back to the old dose of Keppra 500 twice a day and primidone 500 twice a day. 4. History of Crohn's disease on mercaptopurine and sulfasalazine on hold now due to small bowel obstruction. Consult [facility] in AM. 5. Macrocytosis - closely follow CBC further workup as outpatient.  At this time patient's blood pressure has improved with fluids. We will closely monitor. UA is pending. Follow lactic acid levels.  Addendum - patient's urine shows features concerning for UTI for which I have ordered urine cultures and since patient's blood pressure improved initially but still goes back to low normals I have ordered blood cultures procalcitonin levels and repeat lactic acid levels. Continue with aggressive hydration I have ordered 2 more liters of bolus. Patient has started making urine and has 250 mL output at this time. We'll closely monitor. Check CXR.Patient will be placed on empiric antibiotics Vancomycin and Zosyn.   DVT Prophylaxis SCDs.  Code Status:  Full code.  Family Communication: Patient's cousin at the bedside.  Disposition Plan: Admit to inpatient.    Anyely Cunning N. [facility] Pager [phone].  If 7PM-7AM, please contact night-coverage [email or web address] Password [facility] 12/01/2015, 11:35 PM

## 2015-12-01 NOTE — ED Notes (Signed)
Pt. Is unable to urinate at this time. Urinal was left at bedside.

## 2015-12-01 NOTE — ED Notes (Signed)
Below order not completed by EW.

## 2015-12-01 NOTE — ED Notes (Signed)
Modena Jansky, [place] and Roderic Palau, MD made aware of patient lactic result.

## 2015-12-01 NOTE — ED Notes (Signed)
NG tube advanced 4cm as per x-ray confirmation results.

## 2015-12-01 NOTE — Clinical Social Work Note (Signed)
Clinical Social Work Assessment  Patient Details  Name: [name] MRN: [id number] Date of Birth: 22-Sep-1956  Date of referral:  12/01/15               Reason for consult:   (Patient is from facility.)                Permission sought to share information with:   (None.) Permission granted to share information::  No  Name::        Agency::     Relationship::     Contact Information:     Housing/Transportation Living arrangements for the past 2 months:  [facility] (Patient states he is in the independent living unit in [place].) Source of Information:  Patient Patient Interpreter Needed:  None Criminal Activity/Legal Involvement Pertinent to Current Situation/Hospitalization:  No - Comment as needed Significant Relationships:   (Patient states sister is his POA. ) Lives with:  Facility Resident Do you feel safe going back to the place where you live?  Yes Need for family participation in patient care:  Yes (Comment) (Family is supportive.)  Care giving concerns:  There are no care giving concerns at this time. Patient states he is a resident at [facility]. According to patient, he has been a resident at the facility for one year. Patient states he feels safe to return to facility and feels he is at the appropriate level of care.  Patient states he completes ADL's independently. Patient states he does not fall often. Patient informed CSW that he feels he is at the appropriate level of care. Patient states he feels safe to return.  Cousin at bedside states she is patient's local support. However, she states his sister who is in [place] is his POA.  [name] [phone] [name] [phone]   Social Worker assessment / plan:  CSW met with patient at bedside. Cousin was present. Patient confirms that he presents to [facility] due to bowel obstruction.   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   Patient/Family's Understanding of and Emotional Response to Diagnosis, Current Treatment, and Prognosis:  Patient and family have no questions at this time.  Emotional Assessment Appearance:  Appears stated age Attitude/Demeanor/Rapport:   (Appropriate.) Affect (typically observed):  Accepting Orientation:  Oriented to Self, Oriented to Place, Oriented to  Time, Oriented to Situation Alcohol / Substance use:  Not Applicable Psych involvement (Current and /or in the community):  No (Comment)  Discharge Needs  Concerns to be addressed:  Adjustment to Illness Readmission within the last 30 days:  No Current discharge risk:  None Barriers to Discharge:  No Barriers Identified   Bernita Buffy, LCSW 12/01/2015, 10:03 PM

## 2015-12-02 ENCOUNTER — Inpatient Hospital Stay (HOSPITAL_COMMUNITY): Payer: Medicare Other

## 2015-12-02 DIAGNOSIS — K5010 Crohn's disease of large intestine without complications: Secondary | ICD-10-CM

## 2015-12-02 DIAGNOSIS — K5669 Other intestinal obstruction: Secondary | ICD-10-CM

## 2015-12-02 LAB — GLUCOSE, CAPILLARY
GLUCOSE-CAPILLARY: 83 mg/dL (ref 65–99)
GLUCOSE-CAPILLARY: 70 mg/dL (ref 65–99)
GLUCOSE-CAPILLARY: 66 mg/dL (ref 65–99)
GLUCOSE-CAPILLARY: 115 mg/dL — AB (ref 65–99)
Glucose-Capillary: 69 mg/dL (ref 65–99)
Glucose-Capillary: 64 mg/dL — ABNORMAL LOW (ref 65–99)
Glucose-Capillary: 70 mg/dL (ref 65–99)
Glucose-Capillary: 79 mg/dL (ref 65–99)
Glucose-Capillary: 82 mg/dL (ref 65–99)

## 2015-12-02 LAB — CBC WITH DIFFERENTIAL/PLATELET
Basophils Absolute: 0.1 10*3/uL (ref 0.0–0.1)
Basophils Relative: 1 %
EOS PCT: 0 %
Eosinophils Absolute: 0 10*3/uL (ref 0.0–0.7)
HEMATOCRIT: 46.1 % (ref 39.0–52.0)
Hemoglobin: 14.9 g/dL (ref 13.0–17.0)
LYMPHS ABS: 0.3 10*3/uL — AB (ref 0.7–4.0)
Lymphocytes Relative: 4 %
MCH: 35.5 pg — ABNORMAL HIGH (ref 26.0–34.0)
MCHC: 32.3 g/dL (ref 30.0–36.0)
MCV: 109.8 fL — AB (ref 78.0–100.0)
Monocytes Absolute: 0.9 10*3/uL (ref 0.1–1.0)
Monocytes Relative: 13 %
NEUTROS PCT: 82 %
Neutro Abs: 5.7 10*3/uL (ref 1.7–7.7)
Platelets: 187 10*3/uL (ref 150–400)
RBC: 4.2 MIL/uL — AB (ref 4.22–5.81)
RDW: 14.3 % (ref 11.5–15.5)
WBC Morphology: INCREASED
WBC: 7 10*3/uL (ref 4.0–10.5)

## 2015-12-02 LAB — SODIUM, URINE, RANDOM: Sodium, Ur: 23 mmol/L

## 2015-12-02 LAB — COMPREHENSIVE METABOLIC PANEL
ALK PHOS: 68 U/L (ref 38–126)
ALT: 12 U/L — AB (ref 17–63)
AST: 26 U/L (ref 15–41)
Albumin: 2.9 g/dL — ABNORMAL LOW (ref 3.5–5.0)
Anion gap: 12 (ref 5–15)
BILIRUBIN TOTAL: 1.3 mg/dL — AB (ref 0.3–1.2)
BUN: 29 mg/dL — AB (ref 6–20)
CALCIUM: 7.7 mg/dL — AB (ref 8.9–10.3)
CO2: 24 mmol/L (ref 22–32)
CREATININE: 1.67 mg/dL — AB (ref 0.61–1.24)
Chloride: 105 mmol/L (ref 101–111)
GFR, EST AFRICAN AMERICAN: 49 mL/min — AB (ref >60)
GFR, EST NON AFRICAN AMERICAN: 43 mL/min — AB (ref >60)
Glucose, Bld: 71 mg/dL (ref 65–99)
Potassium: 4.9 mmol/L (ref 3.5–5.1)
Sodium: 141 mmol/L (ref 135–145)
TOTAL PROTEIN: 5.6 g/dL — AB (ref 6.5–8.1)

## 2015-12-02 LAB — LACTIC ACID, PLASMA
LACTIC ACID, VENOUS: 5.8 mmol/L — AB (ref 0.5–2.0)
LACTIC ACID, VENOUS: 2.7 mmol/L — AB (ref 0.5–2.0)

## 2015-12-02 LAB — CREATININE, URINE, RANDOM: Creatinine, Urine: 173.01 mg/dL

## 2015-12-02 LAB — PROCALCITONIN: PROCALCITONIN: 4.24 ng/mL

## 2015-12-02 MED ORDER — DEXTROSE 50 % IV SOLN
INTRAVENOUS | Status: AC
  Administered 2015-12-02: 50 mL
  Filled 2015-12-02: qty 50

## 2015-12-02 MED ORDER — CHLORHEXIDINE GLUCONATE 0.12 % MT SOLN
15.0000 mL | Freq: Two times a day (BID) | OROMUCOSAL | Status: DC
  Administered 2015-12-02 – 2015-12-04 (×5): 15 mL via OROMUCOSAL
  Filled 2015-12-02 (×8): qty 15

## 2015-12-02 MED ORDER — DEXTROSE-NACL 5-0.9 % IV SOLN
INTRAVENOUS | Status: DC
  Administered 2015-12-02 – 2015-12-07 (×7): via INTRAVENOUS

## 2015-12-02 MED ORDER — ONDANSETRON HCL 4 MG/2ML IJ SOLN: 4.0000 mg | Freq: Four times a day (QID) | INTRAMUSCULAR | Status: DC | PRN

## 2015-12-02 MED ORDER — SODIUM CHLORIDE 0.9 % IV BOLUS (SEPSIS)
1000.0000 mL | Freq: Once | INTRAVENOUS | Status: AC
  Administered 2015-12-02: 1000 mL via INTRAVENOUS

## 2015-12-02 MED ORDER — PRIMIDONE 250 MG PO TABS
500.0000 mg | ORAL_TABLET | Freq: Two times a day (BID) | ORAL | Status: DC
  Filled 2015-12-02 (×2): qty 2

## 2015-12-02 MED ORDER — PHENOL 1.4 % MT LIQD
1.0000 | OROMUCOSAL | Status: DC | PRN
Start: 2015-12-02 — End: 2015-12-13
  Administered 2015-12-03 (×3): 1 via OROMUCOSAL
  Filled 2015-12-02 (×2): qty 177

## 2015-12-02 MED ORDER — PIPERACILLIN-TAZOBACTAM 3.375 G IVPB
3.3750 g | Freq: Three times a day (TID) | INTRAVENOUS | Status: DC
  Administered 2015-12-02 – 2015-12-05 (×9): 3.375 g via INTRAVENOUS
  Filled 2015-12-02 (×10): qty 50

## 2015-12-02 MED ORDER — DEXTROSE 50 % IV SOLN
1.0000 | Freq: Once | INTRAVENOUS | Status: DC
  Filled 2015-12-02: qty 50

## 2015-12-02 MED ORDER — HEPARIN SODIUM (PORCINE) 5000 UNIT/ML IJ SOLN
5000.0000 [IU] | Freq: Three times a day (TID) | INTRAMUSCULAR | Status: DC
  Administered 2015-12-02 – 2015-12-14 (×33): 5000 [IU] via SUBCUTANEOUS
  Filled 2015-12-02 (×36): qty 1

## 2015-12-02 MED ORDER — ACETAMINOPHEN 325 MG PO TABS
650.0000 mg | ORAL_TABLET | Freq: Four times a day (QID) | ORAL | Status: DC | PRN
  Administered 2015-12-05 (×2): 650 mg via ORAL
  Filled 2015-12-02 (×2): qty 2

## 2015-12-02 MED ORDER — ONDANSETRON HCL 4 MG PO TABS: 4.0000 mg | ORAL_TABLET | Freq: Four times a day (QID) | ORAL | Status: DC | PRN

## 2015-12-02 MED ORDER — VANCOMYCIN HCL 10 G IV SOLR
2000.0000 mg | Freq: Once | INTRAVENOUS | Status: AC
  Administered 2015-12-02: 2000 mg via INTRAVENOUS
  Filled 2015-12-02: qty 2000

## 2015-12-02 MED ORDER — SODIUM CHLORIDE 0.9 % IV BOLUS (SEPSIS)
2000.0000 mL | Freq: Once | INTRAVENOUS | Status: AC
  Administered 2015-12-02: 2000 mL via INTRAVENOUS

## 2015-12-02 MED ORDER — SODIUM CHLORIDE 0.9 % IV SOLN
500.0000 mg | Freq: Two times a day (BID) | INTRAVENOUS | Status: DC
  Administered 2015-12-02: 500 mg via INTRAVENOUS
  Filled 2015-12-02: qty 5

## 2015-12-02 MED ORDER — VANCOMYCIN HCL IN DEXTROSE 750-5 MG/150ML-% IV SOLN
750.0000 mg | Freq: Two times a day (BID) | INTRAVENOUS | Status: DC
  Administered 2015-12-02 – 2015-12-04 (×4): 750 mg via INTRAVENOUS
  Filled 2015-12-02 (×5): qty 150

## 2015-12-02 MED ORDER — PIPERACILLIN-TAZOBACTAM 3.375 G IVPB
3.3750 g | Freq: Once | INTRAVENOUS | Status: AC
  Administered 2015-12-02: 3.375 g via INTRAVENOUS
  Filled 2015-12-02: qty 50

## 2015-12-02 MED ORDER — DEXTROSE 250 MG/ML IV SOLN: 0.5000 g/kg | Freq: Once | INTRAVENOUS | Status: DC

## 2015-12-02 MED ORDER — LEVETIRACETAM 500 MG/5ML IV SOLN
750.0000 mg | Freq: Two times a day (BID) | INTRAVENOUS | Status: DC
  Administered 2015-12-02 – 2015-12-12 (×20): 750 mg via INTRAVENOUS
  Filled 2015-12-02 (×2): qty 7.5
  Filled 2015-12-02: qty 5
  Filled 2015-12-02 (×18): qty 7.5
  Filled 2015-12-02: qty 5
  Filled 2015-12-02: qty 7.5

## 2015-12-02 MED ORDER — ACETAMINOPHEN 650 MG RE SUPP: 650.0000 mg | Freq: Four times a day (QID) | RECTAL | Status: DC | PRN

## 2015-12-02 NOTE — Consult Note (Addendum)
Referring Provider: [facility] Primary Care Physician:  No PCP Per Patient Primary Gastroenterologist:  [name]  Reason for Consultation:  SBO, hx Crohn's      HPI: [name] is a 60 y.o. male who is known to [name]. He has a hx of iliocolonic Crohns thatb was diagnosed around 50. He has had a right hemicolectomy and is known to have fistulizinf and stricturing disease.  He has been maintained on 6MP and sulfasalazine 500 mg daily. He had a colonoscopy by [name] 11/06/14 :ENDOSCOPIC IMPRESSION: 1. There was evidence of prior surgical anastomosis in the proximal descending colon at 45 cm; multiple biopsies of the area were performed using cold forceps, polypoid mucosa in this area also biopsied 2. The colonic mucosa appeared normal in the rectum, sigmoid colon, and descending colon; IBD surveillance biopsies were performed using cold forceps RECOMMENDATIONS: 1. Await biopsy results 2. Await biopsies but recommend CT enterography to ensure complete visualization of the small bowel and colon 3. Continue current medications Biopsies showed benigh colonic mucosa. Subsequent enteropraphy:IMPRESSION: 1. No evidence of active inflammation of the small bowel or colon. No fistula or abscess. 2. Right hemicolectomy anatomy. No inflammation at the enteric colonic anastomosis. 3. Anastomosis in the descending colon without obstruction or Inflammation. Pt states he was feeling well until Tues 11/30/15. He ate BBQ ribs for lunch, and says "they didn'y sit well". He later had a few slices of pizza for supper, and shortly thereafter began to have nausea and vomiting with abd pain.He last had a formed BM Tuesday afternoon. He has not has BRBPR or melena. He presented to the ER where CT of the abdomen and pelvis showed a high-grade small bowel obstruction. He was evaluated by Dr. gross of [facility] surgery and had an NG tube placed. He also was noted to have markedly elevated  creatinine, tachycardia, and hypotension. He was resuscitated with 3 L of fluid. He was admitted for small bowel obstruction. He has since had several canisters drainage from his NG tube and reports that he feels much better. He has not passed any gas or had any further bowel movements.   Past Medical History  Diagnosis Date  . Other B-complex deficiencies   . Iron deficiency anemia, unspecified   . Abscess of anal and rectal regions   . Crohn's disease ([place])   . Mental disability   . Heart murmur   . History of blood transfusion ~ 1992    "related to my Crohn's"  . History of stomach ulcers   . Epilepsy ([place])     "petit mal; only at home" (6/9//2015)  . Syncope and collapse 03/31/2014    "while driving"  . Hyperplastic colon polyp   . REGIONAL ENTERITIS, LARGE INTESTINE 10/05/2000    Qualifier: Diagnosis of  By: Jerral Ralph    . Syncope 03/31/2014  . 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 Hemicolectomy  1985    ileocectomy   . Colonoscopy  2013, 2016  . Umbilical hernia repair  ~ 1983  . Tonsillectomy  ~ 1967  . Hernia repair    . Colon surgery      Anastomosis 45cm from anal verge c/w deswecending colon anastomosis  . Pilonidal cyst excision  1980's    Prior to Admission medications   Medication Sig Start Date End Date Taking? Authorizing Provider  folic acid (  FOLVITE) 1 MG tablet Take 1 mg by mouth daily.   Yes Historical Provider, MD  levETIRAcetam (KEPPRA) 500 MG tablet Take 1 tablet (500 mg total) by mouth 2 (two) times daily. 09/29/15  Yes Cameron Sprang, MD  mercaptopurine (PURINETHOL) 50 MG tablet TAKE 1 TABLET BY MOUTH EVERY DAY GIVE ON AN EMPTY STOMACH 1 HOUR BEFORE OR 2 HOURS AFTER MEALS 09/15/15  Yes Jerene Bears, MD  primidone (MYSOLINE) 250 MG tablet Take 2 tabs twice a day 09/29/15  Yes Cameron Sprang, MD  sulfaSALAzine (AZULFIDINE) 500 MG tablet TAKE 1  TABLET (500 MG TOTAL) BY MOUTH DAILY. 05/05/15  Yes Lafayette Dragon, MD  loratadine (CLARITIN) 10 MG tablet Take 10 mg by mouth daily as needed for allergies.    Historical Provider, MD    Current Facility-Administered Medications  Medication Dose Route Frequency Provider Last Rate Last Dose  . acetaminophen (TYLENOL) suppository 650 mg  650 mg Rectal Q6H PRN Michael Boston, MD      . acetaminophen (TYLENOL) tablet 650 mg  650 mg Oral Q6H PRN Rise Patience, MD       Or  . acetaminophen (TYLENOL) suppository 650 mg  650 mg Rectal Q6H PRN Rise Patience, MD      . alum & mag hydroxide-simeth (MAALOX/MYLANTA) 200-200-20 MG/5ML suspension 30 mL  30 mL Oral Q6H PRN Michael Boston, MD      . bisacodyl (DULCOLAX) suppository 10 mg  10 mg Rectal Q12H PRN Michael Boston, MD      . chlorhexidine (PERIDEX) 0.12 % solution 15 mL  15 mL Mouth Rinse BID Rise Patience, MD   15 mL at 12/02/15 0445  . dextrose 5 %-0.9 % sodium chloride infusion   Intravenous Continuous Debbe Odea, MD 125 mL/hr at 12/02/15 0855    . dextrose 50 % solution 50 mL  1 ampule Intravenous Once Debbe Odea, MD   50 mL at 12/02/15 0815  . diphenhydrAMINE (BENADRYL) injection 12.5-25 mg  12.5-25 mg Intravenous Q6H PRN Michael Boston, MD      . levETIRAcetam (KEPPRA) 750 mg in sodium chloride 0.9 % 100 mL IVPB  750 mg Intravenous Q12H Rise Patience, MD      . lip balm (CARMEX) ointment 1 application  1 application Topical BID Michael Boston, MD   1 application at 72/62/03 2200  . magic mouthwash  15 mL Oral QID PRN Michael Boston, MD      . menthol-cetylpyridinium (CEPACOL) lozenge 3 mg  1 lozenge Oral PRN Michael Boston, MD      . ondansetron [facility]) injection 4 mg  4 mg Intravenous Q6H PRN Michael Boston, MD       Or  . ondansetron (ZOFRAN) 8 mg in sodium chloride 0.9 % 50 mL IVPB  8 mg Intravenous Q6H PRN Michael Boston, MD      . ondansetron [facility]) tablet 4 mg  4 mg Oral Q6H PRN Rise Patience, MD       Or  .  ondansetron (ZOFRAN) injection 4 mg  4 mg Intravenous Q6H PRN Rise Patience, MD      . phenol (CHLORASEPTIC) mouth spray 1 spray  1 spray Mouth/Throat PRN Debbe Odea, MD      . phenol (CHLORASEPTIC) mouth spray 2 spray  2 spray Mouth/Throat PRN Michael Boston, MD      . piperacillin-tazobactam (ZOSYN) IVPB 3.375 g  3.375 g Intravenous 3 times per day Dorrene German, [facility]      .  promethazine (PHENERGAN) injection 6.25-12.5 mg  6.25-12.5 mg Intravenous Q4H PRN Michael Boston, MD      . vancomycin (VANCOCIN) 2,000 mg in sodium chloride 0.9 % 500 mL IVPB  2,000 mg Intravenous Once Angela Adam, RPH   2,000 mg at 12/02/15 0843  . vancomycin (VANCOCIN) IVPB 750 mg/150 ml premix  750 mg Intravenous Q12H Angela Adam, RPH        Allergies as of 12/01/2015  . (No Known Allergies)    Family History  Problem Relation Age of Onset  . Skin cancer Father   . Seizures Neg Hx   . Crohn's disease Neg Hx     Social History   Social History  . Marital Status: Single    Spouse Name: N/A  . Number of Children: 0  . Years of Education: N/A   Occupational History  . Disabled    Social History Main Topics  . Smoking status: Never Smoker   . Smokeless tobacco: Never Used  . Alcohol Use: No  . Drug Use: No  . Sexual Activity: No   Other Topics Concern  . Not on file   Social History Narrative   Lives at [place]: Per history of present illness, otherwise negative  Physical Exam: Vital signs in last 24 hours: Temp:  [97.5 F (36.4 C)-98.4 F (36.9 C)] 97.5 F (36.4 C) (02/09 0843) Pulse Rate:  [88-130] 106 (02/09 0843) Resp:  [18-29] 18 (02/09 0843) BP: (88-117)/(45-64) 100/47 mmHg (02/09 0843) SpO2:  [91 %-97 %] 97 % (02/09 0843) FiO2 (%):  [3 %] 3 % (02/09 0428) Last BM Date: 11/28/15 General:   Alert,  Well-developed,pleasant and cooperative in NAD, NGT in place  Head:  Normocephalic and atraumatic. Eyes:  Sclera clear, no icterus.  Conjunctiva pink. Ears:  Normal auditory acuity. Nose:  No deformity, discharge,  or lesions. Mouth:  No deformity or lesions.   Neck:  Supple; no masses or thyromegaly. Lungs:  Clear throughout to auscultation.     Heart:  Regular rate and rhythm; 2/6 systolic murmur  Abdomen:  Soft, mild diffuse tenderness to palpation with no rebound or guarding, mild distention, diminished  BS act,nonpalp mass or hsm.   Rectal:  Deferred  Msk:  Symmetrical without gross deformities. . Pulses:  Normal pulses noted. Extremities:  Without clubbing or edema. Neurologic: Alert and  oriented x4 Skin:  Intact without significant lesions or rashes.. Psych: Alert and cooperative. Normal mood and affect.  Intake/Output from previous day: 02/08 0701 - 02/09 0700 In: 7040 [I.V.:885; IV Piggyback:6155] Out: 4132 [Urine:580; Emesis/NG output:4875] Intake/Output this shift: Total I/O In: -  Out: 400 [Emesis/NG output:400]  Lab Results:  Recent Labs  12/01/15 1632 12/02/15 0548  WBC 8.5 7.0  HGB 18.8* 14.9  HCT 56.7* 46.1  PLT 308 187   BMET  Recent Labs  12/01/15 1632 12/02/15 0548  NA 139 141  K 4.2 4.9  CL 98* 105  CO2 25 24  GLUCOSE 123* 71  BUN 26* 29*  CREATININE 2.28* 1.67*  CALCIUM 8.9 7.7*   LFT  Recent Labs  12/02/15 0548  PROT 5.6*  ALBUMIN 2.9*  AST 26  ALT 12*  ALKPHOS 68  BILITOT 1.3*     Studies/Results: Ct Abdomen Pelvis Wo Contrast  12/01/2015  CLINICAL DATA:  Upper abdominal discomfort made worse by eating. EXAM: CT ABDOMEN AND PELVIS WITHOUT CONTRAST TECHNIQUE: Multidetector CT imaging of the abdomen and pelvis was performed following the  standard protocol without IV contrast. COMPARISON:  Multiple priors, most recent 01/05/2015. FINDINGS: Lower chest: BILATERAL lower lobe opacities, likely aspiration secondary to nausea and vomiting. Hepatobiliary: No mass visualized on this un-enhanced exam. Gallstones. Pancreas: No mass or inflammatory process identified on  this un-enhanced exam. Spleen: Within normal limits in size. Adrenals/Urinary Tract: No evidence of urolithiasis or hydronephrosis. No definite mass visualized on this un-enhanced exam. Stomach/Bowel: Markedly distended stomach, and small bowel. Partial/RIGHT hemicolectomy. Severe distal small bowel obstruction. Transition zone LEFT mid abdomen, likely secondary to adhesions. Free fluid in the pelvis without signs of pneumatosis or perforation. Vascular/Lymphatic: No pathologically enlarged lymph nodes. No evidence of abdominal aortic aneurysm. Reproductive: No mass or other significant abnormality. Normal prostate. Musculoskeletal:  No suspicious bone lesions identified. IMPRESSION: High-grade small bowel obstruction, likely due to adhesions. No pneumoperitoneum. Small amount of free fluid pelvis. Previous partial colectomy. BILATERAL lower lobe opacities, likely aspiration for nausea and vomiting. Gallstones. Electronically Signed   By: Staci Righter M.D.   On: 12/01/2015 18:51   Dg Abd Portable 1v-small Bowel Obstruction Protocol-initial, 8 Hr Delay  12/02/2015  CLINICAL DATA:  24 hour small bowel protocol film, patient states abdomen still distended, suction on. EXAM: PORTABLE ABDOMEN - 1 VIEW COMPARISON:  12/01/2015 FINDINGS: Nasogastric tube is in place, tip overlying the level of the distal stomach. There has been interval decompression of the stomach. Small amount of contrast is identified the gastric fundus. There is persistent significant dilatation of numerous small bowel loops. Gas is identified in nondilated large bowel loops. No evidence for free intraperitoneal air on this supine views provided. Bowel sutures are identified in the right upper quadrant the abdomen. Degenerative changes are seen in the spine and hips. IMPRESSION: 1. Persistent high-grade small bowel obstruction. 2. Interval decompression of the stomach by nasogastric tube. Electronically Signed   By: Nolon Nations M.D.   On:  12/02/2015 09:49   Dg Abd Portable 1v-small Bowel Protocol-position Verification  12/01/2015  CLINICAL DATA:  Small bowel protocol, evaluate NGT. EXAM: PORTABLE ABDOMEN - 1 VIEW COMPARISON:  12/01/2015. FINDINGS: A gastric tube has been inserted. The last side hole is near the gastroesophageal junction. The tip of the tube lies within the stomach. The tube could be advanced several cm. There is marked gaseous distention of the stomach and small bowel. IMPRESSION: Nasogastric tube tip lies within the stomach but could be advanced several cm. Electronically Signed   By: Staci Righter M.D.   On: 12/01/2015 21:52    IMPRESSION/PLAN: 60 year old male with a history of Crohn's admitted with high-grade small bowel obstruction.? if His obstruction secondary to adhesions versus active Crohn's. At this point continue IV hydration and NG tube decompression. We'll check serial CRPs. Abdominal film this morning with persistent high-grade bowel obstruction but some interval to decompression of the stomach by NGT. Will check QuantiFERON-TB Gold and hepatitis serologies in anticipation of possible addition of Biologics.  A KI-likely due to dehydration from nausea and vomiting History of seizures     Hvozdovic, Vita Barley PA-C 12/02/2015,  Pager [phone]  Mon-Fri 8a-5p [phone] after 5p, weekends, holidays   Attending physician's note   I have taken a history, examined the patient and reviewed the chart. I agree with the Advanced Practitioner's note, impression and recommendations.  8 yr M with h/o Crohn's disease in remission on 6MP and sulfasalzine, no evidence of active inflammation on CT enterography and the colonoscopy Jan 2016. Admitted with SBO high grade obstruction appears to be secondary ?adhesions.  NG tube with bilious output.  Seems unlikely to be an episode of Crohn's flare, will check inflammatory markers. SBO management as per surgical team.    Damaris Hippo, MD [phone] Mon-Fri  8a-5p [phone] after 5p, weekends, holidays

## 2015-12-02 NOTE — Progress Notes (Signed)
Adult (Non-Pregnant) Hypoglycemia Standing Orders  1.  RN shall initiate Hypoglycemia Standing Orders for emergency measures immediately when:            w Routine or STAT CBG and/or a lab glucose indicates hypoglycemia (CBG < 70 mg/dl)  2.  Treat the patient according to ability to take PO's and severity of hypoglycemia.   3.  If patient is on GlucoStabilizer, follow directions provided by the [facility] for hypoglycemic events.  4.  If patient on insulin pump, follow Hypoglycemia Standing Orders.  If patient requires more than one treatment have patient place pump in SUSPEND and notify MD.  DO NOT leave pump in SUSPEND for greater than 30 minutes unless ordered by MD.  A.  Treatment for Mild or Moderate-Patient cooperative and able to swallow    1.  Patient taking PO's and can cooperate   a.  Give one of the following 15 gram CHO options:                           w 1 tube oral dextrose gel                           w 3-4 Glucose tablets                           w 4 oz. Juice                           w 4 oz. regular soda                                    ESRD patients:  clear, regular soda                           w 8 oz. skim milk    b.  Recheck CBG in 15 minutes after treatment                            w If CBG < 70 mg/dl, repeat treatment and recheck until hypoglycemia is resolved                            w If CBG > 70 mg/dl and next meal is more than 1 hour away, give additional 15 grams CHO   2.  Patient NPO-Patient cooperative and no altered mental status    a.  Give 25 ml of D50 IV.   b.  Recheck CBG in 15 minutes after treatment.                             w If CBG is less than 70 mg/dl, repeat treatment and recheck until hypoglycemia is resolved.   c.  Notify MD for further orders.             SPECIAL CONSIDERATIONS:    a.  If no IV access,                              w Start IV of D5W  at [facility]                             w Give 25 ml of D50 IV.    b.   If unable to gain IV access                             w Give Glucagon IM:    i.  1 mg if patient weighs more than 45.5 kg    ii.  0.5 mg if patient weighs less than 45.5 kg   c.  Notify MD for further orders  B.  Treatment for Severe-- Patient unconscious or unable to take PO's safely    1.  Position patient on side   2.  Give 50 ml D50 IV   3.  Recheck CBG in 15 minutes.                    w If CBG is less than 70 mg/dl, repeat treatment and recheck until hypoglycemia is resolved.   4.  Notify MD for further orders.    SPECIAL CONSIDERATIONS:    a.  If no IV access                              w Give Glucagon IM                                        i.  1 mg if patient weighs more than 45.5 kg                                       ii.  0.5 mg if patient weighs less than 45.5 kg                              w Start IV of D5W at 50 ml/hr and give 50 ml D50 IV   b.  If no IV access and active seizure                               w Call Rapid Response   c.  If unable to gain IV access, give Glucagon IM:                              w 1 mg if patient weighs more than 45.5 kg                              w 0.5 mg if patient weighs less than 45.5 kg   d.  Notify MD for further orders.  C.  Complete smart text progress note to document intervention and follow-up CBG   1.  In [facility] patient chart, click on Notes (left side of screen)   2.  Create Progress Note   3.  Click on [employer].  In the Match box type "hypo" and enter    4.  Double click on CH[facility]  IP HYPOGLYCEMIC EVENT and enter data   5.  MD must be notified if patient is NPO or experienced severe hypoglycemia

## 2015-12-02 NOTE — Progress Notes (Signed)
CRITICAL VALUE ALERT  Critical value received:  Lactic acid 2.7  Date of notification:  12/02/15  Time of notification:  0825  Critical value read back: yes  Nurse who received alert:  Henrietta Dine  MD notified (1st page):  Dr. Wynelle Cleveland  Time of first page:  0830  MD notified (2nd page):  Time of second page:  Responding MD:  Dr. Wynelle Cleveland  Time MD responded:  Dr. Wynelle Cleveland

## 2015-12-02 NOTE — Progress Notes (Signed)
Utilization review completed.  

## 2015-12-02 NOTE — Progress Notes (Signed)
[facility] Progress Note     Subjective: Pt says he feels much better he's had several canisters of drainage.  No flatus or BM.  Abdominal distension and pain much improved.  Blood sugar was low this am.    Objective: Vital signs in last 24 hours: Temp:  [98.2 F (36.8 C)-98.4 F (36.9 C)] 98.2 F (36.8 C) (02/09 0428) Pulse Rate:  [88-130] 98 (02/09 0645) Resp:  [18-29] 18 (02/09 0428) BP: (88-117)/(45-64) 95/45 mmHg (02/09 0645) SpO2:  [91 %-97 %] 96 % (02/09 0428) FiO2 (%):  [3 %] 3 % (02/09 0428) Last BM Date: 11/28/15  Intake/Output from previous day: 02/08 0701 - 02/09 0700 In: 7040 [I.V.:885; IV Piggyback:6155] Out: 5455 [Urine:580; Emesis/NG output:4875] Intake/Output this shift:    PE: Gen:  Alert, NAD, pleasant Card:  RRR, murmur heard, G/R heard Pulm:  CTA, no W/R/R Abd: Soft, some distension, mild tenderness in the mid abdomen, no rebound or peritoneal signs, no HSM, diminished BS, abdominal scars noted Ext:  No erythema, edema, or tenderness  Lab Results:   Recent Labs  12/01/15 1632 12/02/15 0548  WBC 8.5 7.0  HGB 18.8* 14.9  HCT 56.7* 46.1  PLT 308 187   BMET  Recent Labs  12/01/15 1632 12/02/15 0548  NA 139 141  K 4.2 4.9  CL 98* 105  CO2 25 24  GLUCOSE 123* 71  BUN 26* 29*  CREATININE 2.28* 1.67*  CALCIUM 8.9 7.7*   PT/INR No results for input(s): LABPROT, INR in the last 72 hours. CMP     Component Value Date/Time   NA 141 12/02/2015 0548   K 4.9 12/02/2015 0548   CL 105 12/02/2015 0548   CO2 24 12/02/2015 0548   GLUCOSE 71 12/02/2015 0548   BUN 29* 12/02/2015 0548   CREATININE 1.67* 12/02/2015 0548   CREATININE 0.87 10/28/2014 1533   CALCIUM 7.7* 12/02/2015 0548   PROT 5.6* 12/02/2015 0548   ALBUMIN 2.9* 12/02/2015 0548   AST 26 12/02/2015 0548   ALT 12* 12/02/2015 0548   ALKPHOS 68 12/02/2015 0548   BILITOT 1.3* 12/02/2015 0548   GFRNONAA 43* 12/02/2015 0548   GFRAA 49* 12/02/2015 0548   Lipase      Component Value Date/Time   LIPASE 26 12/01/2015 1632       Studies/Results: Ct Abdomen Pelvis Wo Contrast  12/01/2015  CLINICAL DATA:  Upper abdominal discomfort made worse by eating. EXAM: CT ABDOMEN AND PELVIS WITHOUT CONTRAST TECHNIQUE: Multidetector CT imaging of the abdomen and pelvis was performed following the standard protocol without IV contrast. COMPARISON:  Multiple priors, most recent 01/05/2015. FINDINGS: Lower chest: BILATERAL lower lobe opacities, likely aspiration secondary to nausea and vomiting. Hepatobiliary: No mass visualized on this un-enhanced exam. Gallstones. Pancreas: No mass or inflammatory process identified on this un-enhanced exam. Spleen: Within normal limits in size. Adrenals/Urinary Tract: No evidence of urolithiasis or hydronephrosis. No definite mass visualized on this un-enhanced exam. Stomach/Bowel: Markedly distended stomach, and small bowel. Partial/RIGHT hemicolectomy. Severe distal small bowel obstruction. Transition zone LEFT mid abdomen, likely secondary to adhesions. Free fluid in the pelvis without signs of pneumatosis or perforation. Vascular/Lymphatic: No pathologically enlarged lymph nodes. No evidence of abdominal aortic aneurysm. Reproductive: No mass or other significant abnormality. Normal prostate. Musculoskeletal:  No suspicious bone lesions identified. IMPRESSION: High-grade small bowel obstruction, likely due to adhesions. No pneumoperitoneum. Small amount of free fluid pelvis. Previous partial colectomy. BILATERAL lower lobe opacities, likely aspiration for nausea and vomiting. Gallstones. Electronically Signed  By: Staci Righter M.D.   On: 12/01/2015 18:51   Dg Abd Portable 1v-small Bowel Protocol-position Verification  12/01/2015  CLINICAL DATA:  Small bowel protocol, evaluate NGT. EXAM: PORTABLE ABDOMEN - 1 VIEW COMPARISON:  12/01/2015. FINDINGS: A gastric tube has been inserted. The last side hole is near the gastroesophageal junction. The  tip of the tube lies within the stomach. The tube could be advanced several cm. There is marked gaseous distention of the stomach and small bowel. IMPRESSION: Nasogastric tube tip lies within the stomach but could be advanced several cm. Electronically Signed   By: Staci Righter M.D.   On: 12/01/2015 21:52    Anti-infectives: Anti-infectives    Start     Dose/Rate Route Frequency Ordered Stop   12/02/15 2000  vancomycin (VANCOCIN) IVPB 750 mg/150 ml premix     750 mg 150 mL/hr over 60 Minutes Intravenous Every 12 hours 12/02/15 0735     12/02/15 1400  piperacillin-tazobactam (ZOSYN) IVPB 3.375 g     3.375 g 12.5 mL/hr over 240 Minutes Intravenous 3 times per day 12/02/15 0459     12/02/15 0800  vancomycin (VANCOCIN) 2,000 mg in sodium chloride 0.9 % 500 mL IVPB     2,000 mg 250 mL/hr over 120 Minutes Intravenous  Once 12/02/15 0730     12/02/15 0500  piperacillin-tazobactam (ZOSYN) IVPB 3.375 g     3.375 g 100 mL/hr over 30 Minutes Intravenous  Once 12/02/15 0457 12/02/15 2952       Assessment/Plan Crohn's disease  -Need GI consult, hasn't been seen in >1year (10/2014)  -H/o prior fistulas, strictures and right hemicolectomy in 1985 Small bowel obstruction ?Crohns vs adhesions  -Medical management for now with IVF, pain control antiemetics -Abdomen is soft and he is clinically improved compared to when he came to the ED -Lactic acid down to 2.7 after 5L, originally 5.8 -SB protocol pending aft -If medically worsens or no improvement with non-operative treatment, may need surgical interventions Acute renal failure with decreased urine output. -Per medicine Tachycardia DVT Proph - SCD's, okay to be on lovenox/heparin from our perspective FEN - NG suction, NPO, IVF, pain control, antiemetics Disp - SB protocol    LOS: 1 day    Nat Christen 12/02/2015, 7:47 AM Pager: [phone]  Agree with above. His abdomen is completely benign right now.   I think he was just very far  behind in volume when her presented, thus the increased lactic acid.  I think he is catching up. Will continued to follow.  Alphonsa Overall, MD, [facility] Pager: [phone] Office phone:  [phone]

## 2015-12-02 NOTE — Clinical Social Work Note (Signed)
Clinical Social Work Assessment  Patient Details  Name: [name] MRN: [id number] Date of Birth: 01-Sep-1956  Date of referral:  12/02/15               Reason for consult:  Facility Placement (Patient is from facility.)                Permission sought to share information with:  Family Supports (None.) Permission granted to share information::  No, Yes, Verbal Permission Granted  Name::      [name] [id number])  Agency::     Relationship::     Contact Information:     Housing/Transportation Living arrangements for the past 2 months:  [place], [place] (Patient states he is in the independent living unit in [place].) Source of Information:  Other (Comment Required), Patient Patient Interpreter Needed:  None Criminal Activity/Legal Involvement Pertinent to Current Situation/Hospitalization:    Significant Relationships:  Other Family Members, Siblings (Patient states sister is his POA. ) Lives with:  Facility Resident Do you feel safe going back to the place where you live?  Yes Need for family participation in patient care:  Yes (Comment) (Family is supportive.)  Care giving concerns:  Admitted from [place]    Social Worker assessment / plan:  CSW spoke with patient and cousin at bedside. He has lived at this ALF for about 3 months and reports he likes it a lot. Plan is to return at dc as long as he is appropriate for ALF level of care.  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[employer].) PT Recommendations:    Information / Referral to community resources:   (Patient feels safe to return to faiclity.)  Patient/Family's Response to care:   Patient/Family's Understanding of and Emotional Response to Diagnosis, Current Treatment, and Prognosis:  Optimistic   Emotional Assessment Appearance:  Appears stated  age Attitude/Demeanor/Rapport:   (Appropriate.) Affect (typically observed):  Accepting Orientation:  Oriented to Self, Oriented to Place, Oriented to  Time, Oriented to Situation Alcohol / Substance use:  Not Applicable Psych involvement (Current and /or in the community):  No (Comment)  Discharge Needs  Concerns to be addressed:  Adjustment to Illness Readmission within the last 30 days:  No Current discharge risk:  None Barriers to Discharge:  No Barriers Identified   Ludwig Clarks, LCSW 12/02/2015, 3:47 PM

## 2015-12-02 NOTE — Progress Notes (Signed)
Hypoglycemic Event  CBG: 66  Treatment: Dextrose IV  Symptoms: weakness, light-headed, shaky  Follow-up CBG: Time: CBG Result:  Possible Reasons for Event: patient is NPO  Comments/MD notified: Dr. Trixie Dredge, Winfrey Chillemi N

## 2015-12-02 NOTE — Progress Notes (Addendum)
Initial Nutrition Assessment  DOCUMENTATION CODES:   Not applicable  INTERVENTION:  - Diet advancement as medically feasible - RD will continue to monitor for needs  NUTRITION DIAGNOSIS:   Inadequate oral intake related to inability to eat as evidenced by NPO status.  GOAL:   Patient will meet greater than or equal to 90% of their needs  MONITOR:   Diet advancement, Weight trends, Labs, I & O's  REASON FOR ASSESSMENT:   Malnutrition Screening Tool  ASSESSMENT:   60 y.o. male with history of seizures, Crohn's disease, mild mental cognitive defect was brought to the ER after patient had multiple episodes of nausea vomiting. As per patient patient had a normal bowel movement 24 hours ago following which patient started developing abdominal pain started off in the lower abdomen. Following which patient had multiple episodes of nausea and vomiting. Patient presented later today to the ER. CT abdomen and pelvis shows high-grade small bowel obstruction. On-call surgeon Dr. Johney Maine was consulted and patient had NG tube placed with at least 500 mL of dark fluid being suctioned. In addition patient has not making much urine since morning. Creatinine is elevated. Patient is also in the low blood pressure range with tachycardia for which patient has been given 3 L of fluid.  Pt seen for MST. BMI and current nutritional needs calculated based on weight from 09/29/15 as no weight available from this admission. BMI indicates overweight/borderline obesity. Pt has been NPO since admission and unable to meet needs. NGT to suction with ~150cc drainage present at time of visit. Pt and visitor report that current canister is the second one since 8 AM with previous canister being 3/4 full before replacement; they also report 3 canisters being used yesterday for drainage.   Pt reports that he is not experiencing nausea or abdominal pain today. He states that pain/pressure and nausea were present for 2 days PTA  with associated emesis. He states last episode of emesis was yesterday at 1100. Pt reports that last meal was 2/7 for dinner and that he has not had anything to eat or drink since that time. He does indicate that although he ate at that time it did exacerbate abdominal pain and nausea.   Pt reports that prior to acute illness he had a very good appetite and ate 3 meals/day. Visitor reports that pt has been losing weight over the past few months but that pt moved to a different facility within that time and has been making healthier food choices and has been much more physically active. Pt reports that he was previously a 46 inch pant size and is now down to a 42 inch pant size. Physical assessment shows no muscle or fat wasting.  RD will continue to monitor for needs, especially once diet advancement begins. Medications reviewed. Labs reviewed; CBGs: 64-115 mg/dL, BUN/creatinine elevated, Ca: 7.7 mg/dL, GFR: 43.  ADDENDUM: IVF: D5-NS @ 125 mL/hr which provides 510 kcal.   Diet Order:  Diet NPO time specified Except for: Sips with Meds  Skin:  Reviewed, no issues  Last BM:  2/5  Height:   Ht Readings from Last 1 Encounters:  12/02/15 5' 10"  (1.778 m)    Weight:   Wt Readings from Last 1 Encounters:  09/29/15 205 lb (92.987 kg)    Ideal Body Weight:  75.45 kg (kg)  BMI:  29.4 kg/m2  Estimated Nutritional Needs:   Kcal:  1750-1950  Protein:  85-95 grams  Fluid:  2 L/day  EDUCATION NEEDS:  No education needs identified at this time     Jarome Matin, [place], [facility] Inpatient Clinical Dietitian Pager # [phone] After hours/weekend pager # [phone]

## 2015-12-02 NOTE — Progress Notes (Signed)
[facility] Progress Note   Jonathan GRIEGER  NWG:[id number]  DOB: 1956-01-03  DOA: 12/01/2015 PCP: No PCP Per Patient  Brief narrative: [name] is a 60 y.o. male male with history of seizures, Crohn's disease, mild mental cognitive defect was brought to the ER after patient had multiple episodes of nausea & vomiting. CT abd and pelvis show high grade obstruction. GI tube placed in ER. Also noted to have ARF and severe lactic acidosis.    Subjective: No abdominal pain ,nausea or vomiting. No fever. No cough, dyspnea, dysuria. No diarrhea.   Assessment/Plan: Principal Problem:   SIRS (systemic inflammatory response syndrome) - due to SBO- follow- lactic acid improving - began to make urine after 5 L of fluid given  Active Problems:   Crohn's disease  - on sulfasalazine at home- GI consulted   SBO - either due to adhesions or crohn's disease- surgery and GI assisting with management - NPO/ NG tube - follow serial abdominal xrays.   Mild hypoglycemia - cont D5NS    Seizure disorder - cont Keppra via IV    ARF (acute renal failure) (HCC) - due to dehydration? -renal function improving    Antibiotics: Anti-infectives    Start     Dose/Rate Route Frequency Ordered Stop   12/02/15 2000  vancomycin (VANCOCIN) IVPB 750 mg/150 ml premix     750 mg 150 mL/hr over 60 Minutes Intravenous Every 12 hours 12/02/15 0735     12/02/15 1400  piperacillin-tazobactam (ZOSYN) IVPB 3.375 g     3.375 g 12.5 mL/hr over 240 Minutes Intravenous 3 times per day 12/02/15 0459     12/02/15 0800  vancomycin (VANCOCIN) 2,000 mg in sodium chloride 0.9 % 500 mL IVPB     2,000 mg 250 mL/hr over 120 Minutes Intravenous  Once 12/02/15 0730 12/02/15 1043   12/02/15 0500  piperacillin-tazobactam (ZOSYN) IVPB 3.375 g     3.375 g 100 mL/hr over 30 Minutes Intravenous  Once 12/02/15 0457 12/02/15 0647     Code Status:     Code Status Orders        Start     Ordered   12/02/15 0131   Full code   Continuous     12/02/15 0130    Code Status History    Date Active Date Inactive Code Status Order ID Comments User Context   03/31/2014  5:33 PM 04/02/2014  7:04 PM Full Code [id number]  Janece Canterbury, MD Inpatient    Advance Directive Documentation        Most Recent Value   Type of Advance Directive  Healthcare Power of Attorney [sister [name] is POA (in [place])]   Pre-existing out of facility DNR order (yellow form or pink MOST form)     "MOST" Form in Place?       Family Communication:  Disposition Plan: expect 2-3 day hospital stay at minimal DVT prophylaxis: Heparin Consultants: gen surgery, GI Procedures: none    Objective: There were no vitals filed for this visit.  Intake/Output Summary (Last 24 hours) at 12/02/15 1444 Last data filed at 12/02/15 1200  Gross per 24 hour  Intake   7280 ml  Output   6655 ml  Net    625 ml     Vitals Filed Vitals:   12/02/15 0139 12/02/15 0428 12/02/15 0645 12/02/15 0843  BP:  92/48 95/45 100/47  Pulse:  100 98 106  Temp:  98.2 F (36.8 C)  97.5 F (36.4 C)  TempSrc:  Oral  Oral  Resp:  18  18  Height: 5' 10"  (1.778 m)     SpO2:  96%  97%    Exam:  General:  Pt is alert, not in acute distress  HEENT: No icterus, No thrush, oral mucosa moist  Cardiovascular: regular rate and rhythm, S1/S2 No murmur  Respiratory: clear to auscultation bilaterally   Abdomen: Soft, no Bowel sounds, non tender, NG tube draining brown fluid, non distended, no guarding  MSK: No cyanosis or clubbing- no pedal edema   Data Reviewed: Basic Metabolic Panel:  Recent Labs Lab 12/01/15 1632 12/02/15 0548  NA 139 141  K 4.2 4.9  CL 98* 105  CO2 25 24  GLUCOSE 123* 71  BUN 26* 29*  CREATININE 2.28* 1.67*  CALCIUM 8.9 7.7*   Liver Function Tests:  Recent Labs Lab 12/01/15 1632 12/02/15 0548  AST 39 26  ALT 21 12*  ALKPHOS 106 68  BILITOT 1.3* 1.3*  PROT 7.1 5.6*  ALBUMIN 3.9 2.9*    Recent Labs Lab  12/01/15 1632  LIPASE 26   No results for input(s): AMMONIA in the last 168 hours. CBC:  Recent Labs Lab 12/01/15 1632 12/02/15 0548  WBC 8.5 7.0  NEUTROABS 7.7 5.7  HGB 18.8* 14.9  HCT 56.7* 46.1  MCV 109.5* 109.8*  PLT 308 187   Cardiac Enzymes: No results for input(s): CKTOTAL, CKMB, CKMBINDEX, TROPONINI in the last 168 hours. BNP (last 3 results) No results for input(s): BNP in the last 8760 hours.  ProBNP (last 3 results) No results for input(s): PROBNP in the last 8760 hours.  CBG:  Recent Labs Lab 12/02/15 0249 12/02/15 0424 12/02/15 0756 12/02/15 0900 12/02/15 1202  GLUCAP 83 70 66 115* 70    No results found for this or any previous visit (from the past 240 hour(s)).   Studies: Ct Abdomen Pelvis Wo Contrast  12/01/2015  CLINICAL DATA:  Upper abdominal discomfort made worse by eating. EXAM: CT ABDOMEN AND PELVIS WITHOUT CONTRAST TECHNIQUE: Multidetector CT imaging of the abdomen and pelvis was performed following the standard protocol without IV contrast. COMPARISON:  Multiple priors, most recent 01/05/2015. FINDINGS: Lower chest: BILATERAL lower lobe opacities, likely aspiration secondary to nausea and vomiting. Hepatobiliary: No mass visualized on this un-enhanced exam. Gallstones. Pancreas: No mass or inflammatory process identified on this un-enhanced exam. Spleen: Within normal limits in size. Adrenals/Urinary Tract: No evidence of urolithiasis or hydronephrosis. No definite mass visualized on this un-enhanced exam. Stomach/Bowel: Markedly distended stomach, and small bowel. Partial/RIGHT hemicolectomy. Severe distal small bowel obstruction. Transition zone LEFT mid abdomen, likely secondary to adhesions. Free fluid in the pelvis without signs of pneumatosis or perforation. Vascular/Lymphatic: No pathologically enlarged lymph nodes. No evidence of abdominal aortic aneurysm. Reproductive: No mass or other significant abnormality. Normal prostate. Musculoskeletal:   No suspicious bone lesions identified. IMPRESSION: High-grade small bowel obstruction, likely due to adhesions. No pneumoperitoneum. Small amount of free fluid pelvis. Previous partial colectomy. BILATERAL lower lobe opacities, likely aspiration for nausea and vomiting. Gallstones. Electronically Signed   By: Staci Righter M.D.   On: 12/01/2015 18:51   Dg Chest Port 1 View  12/02/2015  CLINICAL DATA:  Followup of small bowel obstruction, sepsis. EXAM: PORTABLE CHEST 1 VIEW COMPARISON:  Chest CT 03/31/2014.  Abdominal CT of 12/01/2015. FINDINGS: nasogastric tube extends beyond the inferior aspect of the film. Midline trachea. Cardiomegaly accentuated by AP portable technique. Probable small left pleural effusion. No pneumothorax. Patchy bibasilar airspace disease. Low  lung volumes with resultant pulmonary interstitial prominence. IMPRESSION: Bibasilar airspace disease, suspicious for pneumonia or aspiration. Similar to the prior CT. Cardiomegaly and mildly low lung volumes. Electronically Signed   By: Abigail Miyamoto M.D.   On: 12/02/2015 10:35   Dg Abd Portable 1v-small Bowel Obstruction Protocol-initial, 8 Hr Delay  12/02/2015  CLINICAL DATA:  24 hour small bowel protocol film, patient states abdomen still distended, suction on. EXAM: PORTABLE ABDOMEN - 1 VIEW COMPARISON:  12/01/2015 FINDINGS: Nasogastric tube is in place, tip overlying the level of the distal stomach. There has been interval decompression of the stomach. Small amount of contrast is identified the gastric fundus. There is persistent significant dilatation of numerous small bowel loops. Gas is identified in nondilated large bowel loops. No evidence for free intraperitoneal air on this supine views provided. Bowel sutures are identified in the right upper quadrant the abdomen. Degenerative changes are seen in the spine and hips. IMPRESSION: 1. Persistent high-grade small bowel obstruction. 2. Interval decompression of the stomach by nasogastric  tube. Electronically Signed   By: Nolon Nations M.D.   On: 12/02/2015 09:49   Dg Abd Portable 1v-small Bowel Protocol-position Verification  12/01/2015  CLINICAL DATA:  Small bowel protocol, evaluate NGT. EXAM: PORTABLE ABDOMEN - 1 VIEW COMPARISON:  12/01/2015. FINDINGS: A gastric tube has been inserted. The last side hole is near the gastroesophageal junction. The tip of the tube lies within the stomach. The tube could be advanced several cm. There is marked gaseous distention of the stomach and small bowel. IMPRESSION: Nasogastric tube tip lies within the stomach but could be advanced several cm. Electronically Signed   By: Staci Righter M.D.   On: 12/01/2015 21:52    Scheduled Meds:  Scheduled Meds: . chlorhexidine  15 mL Mouth Rinse BID  . dextrose  1 ampule Intravenous Once  . levETIRAcetam  750 mg Intravenous Q12H  . lip balm  1 application Topical BID  . piperacillin-tazobactam (ZOSYN)  IV  3.375 g Intravenous 3 times per day  . vancomycin  750 mg Intravenous Q12H   Continuous Infusions: . dextrose 5 % and 0.9% NaCl 125 mL/hr at 12/02/15 0855    Time spent on care of this patient: 35 min   [place], MD 12/02/2015, 2:44 PM  LOS: 1 day   [facility] Office  [phone] Pager - Text Page per [email or web address] If 7PM-7AM, please contact night-coverage [email or web address]

## 2015-12-02 NOTE — Progress Notes (Signed)
Pharmacy Antibiotic Note  [name] is a 60 y.o. male admitted on 12/01/2015 with Sepsis.  Pharmacy had been consulted for Zosyn dosing, now adding vancomycin  Plan: Vancomycin 2036m IV x1 then 7530mIV q12h Continue Zosyn 3.375g IV Q8H infused over 4hrs. Follow renal function, cultures vanc trough at steady state as needed  Height: 5' 10"  (177.8 cm) IBW/kg (Calculated) : 73  Temp (24hrs), Avg:98.3 F (36.8 C), Min:98.2 F (36.8 C), Max:98.4 F (36.9 C)   Recent Labs Lab 12/01/15 1632 12/01/15 2118 12/01/15 2314 12/02/15 0548  WBC 8.5  --   --  7.0  CREATININE 2.28*  --   --  1.67*  LATICACIDVEN  --  5.64* 5.8*  --     CrCl (N) ~ 46 mls/min  No Known Allergies  Antimicrobials this admission: 2/9 zosyn >>  2/9 vancomycin >>   Dose adjustments this admission: n/a  Microbiology results:  2/9 BCx:   2/9 UCx:    Sputum:    MRSA PCR:   Thank you for allowing pharmacy to be a part of this patient's care.  ElDolly RiasPh 12/02/2015, 7:34 AM Pager [phone]

## 2015-12-02 NOTE — Progress Notes (Signed)
Pharmacy Antibiotic Note  [name] is a 60 y.o. male admitted on 12/01/2015 with Sepsis.  Pharmacy has been consulted for Zosyn dosing.  Plan: Zosyn 3.375g IV q8h (4 hour infusion).  Height: 5' 10"  (177.8 cm) IBW/kg (Calculated) : 73  Temp (24hrs), Avg:98.3 F (36.8 C), Min:98.2 F (36.8 C), Max:98.4 F (36.9 C)   Recent Labs Lab 12/01/15 1632 12/01/15 2118 12/01/15 2314  WBC 8.5  --   --   CREATININE 2.28*  --   --   LATICACIDVEN  --  5.64* 5.8*    CrCl cannot be calculated (Unknown ideal weight.).    No Known Allergies  Antimicrobials this admission: 2/9 zosyn >>    Dose adjustments this admission:   Microbiology results:  BCx:   UCx:    Sputum:    MRSA PCR:   Thank you for allowing pharmacy to be a part of this patient's care.  Dorrene German 12/02/2015 5:02 AM

## 2015-12-02 NOTE — Progress Notes (Addendum)
Hypoglycemic Event  CBG: 69  Treatment: D50 IV 50 mL  Symptoms: None  Follow-up CBG: [id number] CBG Result:83  Possible Reasons for Event: Inadequate meal intake  Comments/MD notified:Pt aware of signs and symptoms of hypoglycemia and will alert staff if he notices any of these.  Pt is also already ordered q4 CBGs, so staff will continue to monitor.   Gildardo Pounds

## 2015-12-02 NOTE — NC FL2 (Signed)
[place] LEVEL OF CARE SCREENING TOOL     IDENTIFICATION  Patient Name: [name] Birthdate: [birth year] Sex: male Admission Date (Current Location): 12/01/2015  [facility] and [place] Number:  Herbalist and Address:  [facility],  [place] [address], [place]      Provider Number: [id number]  Attending Physician Name and Address:  Debbe Odea, MD  Relative Name and Phone Number:       Current Level of Care: Hospital Recommended Level of Care: [place] Prior Approval Number:    Date Approved/Denied:   PASRR Number:    Discharge Plan: Other (Comment)    Current Diagnoses: Patient Active Problem List   Diagnosis Date Noted  . SBO (small bowel obstruction) ([place]) 12/01/2015  . S/P partial colectomy for Crohn's disease 12/01/2015  . ARF (acute renal failure) ([place]) 12/01/2015  . Shock ([place]) 12/01/2015  . SIRS (systemic inflammatory response syndrome) ([place]) 12/01/2015  . Localization-related idiopathic epilepsy and epileptic syndromes with seizures of localized onset, not intractable, without status epilepticus ([place]) 03/30/2015  . Aortic valve stenosis, mild 03/31/2014  . Aortic valve regurgitation 03/31/2014  . Pseudopolyposis of colon ([place]) 01/03/2012  . Crohn's disease ([place]) 12/19/2011  . Personal history of immunosupression therapy 12/19/2011  . Seizure disorder ([place]) 12/19/2011  . ECZEMA 08/21/2008  . CHOLELITHIASIS, ASYMPTOMATIC 01/29/2008  . VITAMIN B12 DEFICIENCY 01/17/2008    Orientation RESPIRATION BLADDER Height & Weight     Self    Continent Weight:   Height:  5' 10"  (177.8 cm)  BEHAVIORAL SYMPTOMS/MOOD NEUROLOGICAL BOWEL NUTRITION STATUS      Continent    AMBULATORY STATUS COMMUNICATION OF NEEDS Skin   Limited Assist Verbally Normal                       Personal Care Assistance Level of Assistance  Bathing, Dressing Bathing Assistance: Limited assistance   Dressing Assistance:  Limited assistance     Functional Limitations Info  Sight          SPECIAL CARE FACTORS FREQUENCY                       Contractures      Additional Factors Info  Code Status, Allergies Code Status Info: FULL Allergies Info: NKDA           Current Medications (12/02/2015):  This is the current hospital active medication list Current Facility-Administered Medications  Medication Dose Route Frequency Provider Last Rate Last Dose  . acetaminophen (TYLENOL) suppository 650 mg  650 mg Rectal Q6H PRN Michael Boston, MD      . acetaminophen (TYLENOL) tablet 650 mg  650 mg Oral Q6H PRN Rise Patience, MD       Or  . acetaminophen (TYLENOL) suppository 650 mg  650 mg Rectal Q6H PRN Rise Patience, MD      . alum & mag hydroxide-simeth (MAALOX/MYLANTA) 200-200-20 MG/5ML suspension 30 mL  30 mL Oral Q6H PRN Michael Boston, MD      . bisacodyl (DULCOLAX) suppository 10 mg  10 mg Rectal Q12H PRN Michael Boston, MD      . chlorhexidine (PERIDEX) 0.12 % solution 15 mL  15 mL Mouth Rinse BID Rise Patience, MD   15 mL at 12/02/15 0445  . dextrose 5 %-0.9 % sodium chloride infusion   Intravenous Continuous Debbe Odea, MD 125 mL/hr at 12/02/15 0855    . dextrose 50 %  solution 50 mL  1 ampule Intravenous Once Debbe Odea, MD   50 mL at 12/02/15 0815  . diphenhydrAMINE (BENADRYL) injection 12.5-25 mg  12.5-25 mg Intravenous Q6H PRN Michael Boston, MD      . levETIRAcetam (KEPPRA) 750 mg in sodium chloride 0.9 % 100 mL IVPB  750 mg Intravenous Q12H Rise Patience, MD   750 mg at 12/02/15 1104  . lip balm (CARMEX) ointment 1 application  1 application Topical BID Michael Boston, MD   1 application at 15/17/61 2200  . magic mouthwash  15 mL Oral QID PRN Michael Boston, MD      . menthol-cetylpyridinium (CEPACOL) lozenge 3 mg  1 lozenge Oral PRN Michael Boston, MD      . ondansetron [facility]) injection 4 mg  4 mg Intravenous Q6H PRN Michael Boston, MD       Or  . ondansetron (ZOFRAN) 8  mg in sodium chloride 0.9 % 50 mL IVPB  8 mg Intravenous Q6H PRN Michael Boston, MD      . ondansetron [facility]) tablet 4 mg  4 mg Oral Q6H PRN Rise Patience, MD       Or  . ondansetron (ZOFRAN) injection 4 mg  4 mg Intravenous Q6H PRN Rise Patience, MD      . phenol (CHLORASEPTIC) mouth spray 1 spray  1 spray Mouth/Throat PRN Debbe Odea, MD      . phenol (CHLORASEPTIC) mouth spray 2 spray  2 spray Mouth/Throat PRN Michael Boston, MD   2 spray at 12/02/15 1126  . piperacillin-tazobactam (ZOSYN) IVPB 3.375 g  3.375 g Intravenous 3 times per day Dorrene German, [facility]      . promethazine (PHENERGAN) injection 6.25-12.5 mg  6.25-12.5 mg Intravenous Q4H PRN Michael Boston, MD      . vancomycin (VANCOCIN) IVPB 750 mg/150 ml premix  750 mg Intravenous Q12H Angela Adam, [facility]         Discharge Medications: Please see discharge summary for a list of discharge medications.  Relevant Imaging Results:  Relevant Lab Results:   Additional Information SSN [id number]  Ludwig Clarks, LCSW

## 2015-12-03 ENCOUNTER — Inpatient Hospital Stay (HOSPITAL_COMMUNITY): Payer: Medicare Other

## 2015-12-03 ENCOUNTER — Ambulatory Visit: Payer: Medicare Other | Admitting: Internal Medicine

## 2015-12-03 LAB — BASIC METABOLIC PANEL
ANION GAP: 8 (ref 5–15)
BUN: 20 mg/dL (ref 6–20)
CHLORIDE: 107 mmol/L (ref 101–111)
CO2: 23 mmol/L (ref 22–32)
Calcium: 7.5 mg/dL — ABNORMAL LOW (ref 8.9–10.3)
Creatinine, Ser: 1.2 mg/dL (ref 0.61–1.24)
GFR calc non Af Amer: >60 mL/min (ref >60)
Glucose, Bld: 101 mg/dL — ABNORMAL HIGH (ref 65–99)
POTASSIUM: 3.6 mmol/L (ref 3.5–5.1)
SODIUM: 138 mmol/L (ref 135–145)

## 2015-12-03 LAB — GLUCOSE, CAPILLARY
GLUCOSE-CAPILLARY: 83 mg/dL (ref 65–99)
Glucose-Capillary: 105 mg/dL — ABNORMAL HIGH (ref 65–99)
Glucose-Capillary: 72 mg/dL (ref 65–99)
Glucose-Capillary: 76 mg/dL (ref 65–99)
Glucose-Capillary: 82 mg/dL (ref 65–99)
Glucose-Capillary: 88 mg/dL (ref 65–99)
Glucose-Capillary: 89 mg/dL (ref 65–99)

## 2015-12-03 LAB — CBC WITH DIFFERENTIAL/PLATELET
BASOS PCT: 0 %
Basophils Absolute: 0 10*3/uL (ref 0.0–0.1)
EOS PCT: 0 %
Eosinophils Absolute: 0 10*3/uL (ref 0.0–0.7)
HEMATOCRIT: 40.9 % (ref 39.0–52.0)
Hemoglobin: 13.2 g/dL (ref 13.0–17.0)
LYMPHS ABS: 0.4 10*3/uL — AB (ref 0.7–4.0)
Lymphocytes Relative: 6 %
MCH: 36 pg — ABNORMAL HIGH (ref 26.0–34.0)
MCHC: 32.3 g/dL (ref 30.0–36.0)
MCV: 111.4 fL — AB (ref 78.0–100.0)
MONOS PCT: 8 %
Monocytes Absolute: 0.6 10*3/uL (ref 0.1–1.0)
NEUTROS ABS: 6.1 10*3/uL (ref 1.7–7.7)
Neutrophils Relative %: 86 %
Platelets: 181 10*3/uL (ref 150–400)
RBC: 3.67 MIL/uL — AB (ref 4.22–5.81)
RDW: 14.4 % (ref 11.5–15.5)
WBC: 7.1 10*3/uL (ref 4.0–10.5)

## 2015-12-03 LAB — HEPATITIS B SURFACE ANTIBODY,QUALITATIVE: Hep B S Ab: NONREACTIVE

## 2015-12-03 LAB — URINE CULTURE

## 2015-12-03 LAB — HEPATITIS C ANTIBODY: HCV Ab: <0.1 s/co ratio (ref 0.0–0.9)

## 2015-12-03 LAB — HEPATITIS A ANTIBODY, TOTAL: Hep A Total Ab: POSITIVE — AB

## 2015-12-03 MED ORDER — BACITRACIN-NEOMYCIN-POLYMYXIN OINTMENT TUBE
TOPICAL_OINTMENT | Freq: Every day | CUTANEOUS | Status: DC
  Administered 2015-12-03 – 2015-12-13 (×10): via TOPICAL
  Filled 2015-12-03: qty 15

## 2015-12-03 MED ORDER — SODIUM CHLORIDE 0.9 % IV BOLUS (SEPSIS)
1000.0000 mL | Freq: Once | INTRAVENOUS | Status: AC
  Administered 2015-12-03: 1000 mL via INTRAVENOUS

## 2015-12-03 NOTE — Progress Notes (Signed)
Subjective: He looks fine, he isn't having pain and he is soft on exam.  No flatus almost no BS, still blown up on films.  Objective: Vital signs in last 24 hours: Temp:  [98.2 F (36.8 C)-99.2 F (37.3 C)] 99.2 F (37.3 C) (02/10 0457) Pulse Rate:  [93-105] 93 (02/10 0457) Resp:  [18-20] 18 (02/10 0457) BP: (103-118)/(49-56) 118/51 mmHg (02/10 0457) SpO2:  [91 %-96 %] 91 % (02/10 0457) Last BM Date: 11/28/15 3900 from NG 1100 urine Fluid balance is negative per I/O Afebrile, VSS Creatinine is better this Am SB protocol last PM:  Obstruction persist Film this AM:  Incomplete but relatively high-grade small-bowel obstruction very similar to prior study Intake/Output from previous day: 02/09 0701 - 02/10 0700 In: 3739.2 [I.V.:2744.2; NG/GT:240; IV Piggyback:515] Out: 5000 [Urine:1100; Emesis/NG RCBULA:4536] Intake/Output this shift:    General appearance: alert, cooperative and no distress GI: soft, not tender even on deep palpation, he may be some distended but he is not uncomfortable.   NG continues to drain bilious green fluid.  Lab Results:   Recent Labs  12/01/15 1632 12/02/15 0548  WBC 8.5 7.0  HGB 18.8* 14.9  HCT 56.7* 46.1  PLT 308 187    BMET  Recent Labs  12/02/15 0548 12/03/15 0540  NA 141 138  K 4.9 3.6  CL 105 107  CO2 24 23  GLUCOSE 71 101*  BUN 29* 20  CREATININE 1.67* 1.20  CALCIUM 7.7* 7.5*   PT/INR No results for input(s): LABPROT, INR in the last 72 hours.   Recent Labs Lab 12/01/15 1632 12/02/15 0548  AST 39 26  ALT 21 12*  ALKPHOS 106 68  BILITOT 1.3* 1.3*  PROT 7.1 5.6*  ALBUMIN 3.9 2.9*     Lipase     Component Value Date/Time   LIPASE 26 12/01/2015 1632     Studies/Results: Ct Abdomen Pelvis Wo Contrast  12/01/2015  CLINICAL DATA:  Upper abdominal discomfort made worse by eating. EXAM: CT ABDOMEN AND PELVIS WITHOUT CONTRAST TECHNIQUE: Multidetector CT imaging of the abdomen and pelvis was performed following  the standard protocol without IV contrast. COMPARISON:  Multiple priors, most recent 01/05/2015. FINDINGS: Lower chest: BILATERAL lower lobe opacities, likely aspiration secondary to nausea and vomiting. Hepatobiliary: No mass visualized on this un-enhanced exam. Gallstones. Pancreas: No mass or inflammatory process identified on this un-enhanced exam. Spleen: Within normal limits in size. Adrenals/Urinary Tract: No evidence of urolithiasis or hydronephrosis. No definite mass visualized on this un-enhanced exam. Stomach/Bowel: Markedly distended stomach, and small bowel. Partial/RIGHT hemicolectomy. Severe distal small bowel obstruction. Transition zone LEFT mid abdomen, likely secondary to adhesions. Free fluid in the pelvis without signs of pneumatosis or perforation. Vascular/Lymphatic: No pathologically enlarged lymph nodes. No evidence of abdominal aortic aneurysm. Reproductive: No mass or other significant abnormality. Normal prostate. Musculoskeletal:  No suspicious bone lesions identified. IMPRESSION: High-grade small bowel obstruction, likely due to adhesions. No pneumoperitoneum. Small amount of free fluid pelvis. Previous partial colectomy. BILATERAL lower lobe opacities, likely aspiration for nausea and vomiting. Gallstones. Electronically Signed   By: Staci Righter M.D.   On: 12/01/2015 18:51   Dg Chest Port 1 View  12/02/2015  CLINICAL DATA:  Followup of small bowel obstruction, sepsis. EXAM: PORTABLE CHEST 1 VIEW COMPARISON:  Chest CT 03/31/2014.  Abdominal CT of 12/01/2015. FINDINGS: nasogastric tube extends beyond the inferior aspect of the film. Midline trachea. Cardiomegaly accentuated by AP portable technique. Probable small left pleural effusion. No pneumothorax. Patchy bibasilar airspace disease.  Low lung volumes with resultant pulmonary interstitial prominence. IMPRESSION: Bibasilar airspace disease, suspicious for pneumonia or aspiration. Similar to the prior CT. Cardiomegaly and mildly low  lung volumes. Electronically Signed   By: Abigail Miyamoto M.D.   On: 12/02/2015 10:35   Dg Abd 2 Views  12/03/2015  CLINICAL DATA:  Followup small-bowel obstruction EXAM: ABDOMEN - 2 VIEW COMPARISON:  12/02/2015 FINDINGS: The small bowel dilatation is very similar to the prior study with small bowel loops measuring up to 6 cm. Proximal to mid small bowel is distended with decompressed distal small bowel and decompressed colon. There is air seen in to the distal colon. An NG tube projects over the anticipated position of the proximal duodenum. A tiny volume of contrast is seen within the stomach. The dilated loops show air-fluid levels. There is no evidence of pneumoperitoneum. IMPRESSION: Incomplete but relatively high-grade small-bowel obstruction very similar to prior study. Electronically Signed   By: Skipper Cliche M.D.   On: 12/03/2015 08:37   Dg Abd 2 Views  12/02/2015  CLINICAL DATA:  Small bowel obstruction. EXAM: ABDOMEN - 2 VIEW COMPARISON:  None. FINDINGS: Nasogastric tube is in the stomach which is decompressed. Significant small bowel obstruction remains. The exact site of obstruction is not observed on these radiographs. There is no free air. IMPRESSION: Small bowel obstruction persists. Electronically Signed   By: Staci Righter M.D.   On: 12/02/2015 22:25   Dg Abd Portable 1v-small Bowel Obstruction Protocol-initial, 8 Hr Delay  12/02/2015  CLINICAL DATA:  24 hour small bowel protocol film, patient states abdomen still distended, suction on. EXAM: PORTABLE ABDOMEN - 1 VIEW COMPARISON:  12/01/2015 FINDINGS: Nasogastric tube is in place, tip overlying the level of the distal stomach. There has been interval decompression of the stomach. Small amount of contrast is identified the gastric fundus. There is persistent significant dilatation of numerous small bowel loops. Gas is identified in nondilated large bowel loops. No evidence for free intraperitoneal air on this supine views provided. Bowel  sutures are identified in the right upper quadrant the abdomen. Degenerative changes are seen in the spine and hips. IMPRESSION: 1. Persistent high-grade small bowel obstruction. 2. Interval decompression of the stomach by nasogastric tube. Electronically Signed   By: Nolon Nations M.D.   On: 12/02/2015 09:49   Dg Abd Portable 1v-small Bowel Protocol-position Verification  12/01/2015  CLINICAL DATA:  Small bowel protocol, evaluate NGT. EXAM: PORTABLE ABDOMEN - 1 VIEW COMPARISON:  12/01/2015. FINDINGS: A gastric tube has been inserted. The last side hole is near the gastroesophageal junction. The tip of the tube lies within the stomach. The tube could be advanced several cm. There is marked gaseous distention of the stomach and small bowel. IMPRESSION: Nasogastric tube tip lies within the stomach but could be advanced several cm. Electronically Signed   By: Staci Righter M.D.   On: 12/01/2015 21:52    Medications: . chlorhexidine  15 mL Mouth Rinse BID  . dextrose  1 ampule Intravenous Once  . heparin subcutaneous  5,000 Units Subcutaneous 3 times per day  . levETIRAcetam  750 mg Intravenous Q12H  . lip balm  1 application Topical BID  . piperacillin-tazobactam (ZOSYN)  IV  3.375 g Intravenous 3 times per day  . vancomycin  750 mg Intravenous Q12H    Assessment/Plan Crohns disease SBO with adhesions vs active Crohn's Acute renal failure with decreased urine output Tachycardia DVT:  SCD   Plan:  Dr. Lucia Gaskins will see shortly and decided  on whether or not we need to take him to the OR.  i talked with Dr. Silverio Decamp and it is her opinion this is most likely not Crohn's.  I discussed with the patient the findings on his films, and we are concerned he needs surgery.  The nursing staff is going to call contacts and see who needs to sign for him and a decision will be made after Dr. Lucia Gaskins has an opportunity to see him.     LOS: 2 days    JENNINGS,[name] 12/03/2015  Agree with above. He has  made no progress, but he is not tender. We have several emergencies this afternoon and evening. I will schedule him for surgery tomorrow AM.  His cousin, [name], was in the room. I called his sister, Linus Salmons [phone]) in [place] about the plans.  I discussed the indications and risks of surgery.   The risks include bleeding, infection, bowel resection or ostomy.  If he's clearly better in the AM, we will repeat x-rays and hold off.  Alphonsa Overall, MD, [facility] Surgery Pager: [phone] Office phone:  [phone]

## 2015-12-03 NOTE — Progress Notes (Signed)
[facility] Progress Note   [name]  EGB:[id number]  DOB: 1956/06/22  DOA: 12/01/2015 PCP: No PCP Per Patient  Brief narrative: [name] is a 60 y.o. male male with history of seizures, Crohn's disease, mild mental cognitive defect was brought to the ER after patient had multiple episodes of nausea & vomiting. CT abd and pelvis show high grade obstruction. GI tube placed in ER. Also noted to have ARF and severe lactic acidosis.    Subjective: No change in symptoms- No abdominal pain ,nausea or vomiting. No fever. No cough, dyspnea, dysuria. No diarrhea.   Assessment/Plan: Principal Problem:   SIRS (systemic inflammatory response syndrome) - due to SBO-   - he began to make urine after 5 L of fluid given  SBO - either due to adhesions or crohn's disease- surgery and GI assisting with management - NPO/ NG tube - follow serial abdominal xrays.   Dehydration - will give another NS 1 L bolus today as he is in negative balance due to large amount of NG tube output    Crohn's disease  - on sulfasalazine at home- GI consulted - they do not suspect this is a Crohn's flare  Mild hypoglycemia - cont D5NS    Seizure disorder - cont Keppra via IV    ARF (acute renal failure) (HCC) - likely due to dehydration? -renal function has normalized    Antibiotics: Anti-infectives    Start     Dose/Rate Route Frequency Ordered Stop   12/02/15 2000  vancomycin (VANCOCIN) IVPB 750 mg/150 ml premix     750 mg 150 mL/hr over 60 Minutes Intravenous Every 12 hours 12/02/15 0735     12/02/15 1400  piperacillin-tazobactam (ZOSYN) IVPB 3.375 g     3.375 g 12.5 mL/hr over 240 Minutes Intravenous 3 times per day 12/02/15 0459     12/02/15 0800  vancomycin (VANCOCIN) 2,000 mg in sodium chloride 0.9 % 500 mL IVPB     2,000 mg 250 mL/hr over 120 Minutes Intravenous  Once 12/02/15 0730 12/02/15 1043   12/02/15 0500  piperacillin-tazobactam (ZOSYN) IVPB 3.375 g     3.375 g 100  mL/hr over 30 Minutes Intravenous  Once 12/02/15 0457 12/02/15 0647     Code Status:     Code Status Orders        Start     Ordered   12/02/15 0131  Full code   Continuous     12/02/15 0130    Code Status History    Date Active Date Inactive Code Status Order ID Comments User Context   03/31/2014  5:33 PM 04/02/2014  7:04 PM Full Code [id number]  Janece Canterbury, MD Inpatient    Advance Directive Documentation        Most Recent Value   Type of Advance Directive  Healthcare Power of Attorney [sister [name] is POA (in [place])]   Pre-existing out of facility DNR order (yellow form or pink MOST form)     "MOST" Form in Place?       Family Communication:  Disposition Plan: expect 2-3 day hospital stay at minimal DVT prophylaxis: Heparin Consultants: gen surgery, GI Procedures: none    Objective: There were no vitals filed for this visit.  Intake/Output Summary (Last 24 hours) at 12/03/15 1451 Last data filed at 12/03/15 1430  Gross per 24 hour  Intake 3499.16 ml  Output   4375 ml  Net -875.84 ml     Vitals Filed Vitals:   12/02/15  2020 12/03/15 0457 12/03/15 0845 12/03/15 1300  BP: 111/56 118/51 124/53 123/51  Pulse: 96 93 86 81  Temp: 98.2 F (36.8 C) 99.2 F (37.3 C) 98.6 F (37 C) 98.6 F (37 C)  TempSrc: Oral Oral Oral Oral  Resp: 19 18 17 18   Height:      SpO2: 96% 91% 93% 92%    Exam:  General:  Pt is alert, not in acute distress  HEENT: No icterus, No thrush, oral mucosa moist  Cardiovascular: regular rate and rhythm, S1/S2 No murmur  Respiratory: clear to auscultation bilaterally   Abdomen: Soft, no Bowel sounds, non tender, NG tube draining brown fluid, non distended, no guarding  MSK: No cyanosis or clubbing- no pedal edema   Data Reviewed: Basic Metabolic Panel:  Recent Labs Lab 12/01/15 1632 12/02/15 0548 12/03/15 0540  NA 139 141 138  K 4.2 4.9 3.6  CL 98* 105 107  CO2 25 24 23   GLUCOSE 123* 71 101*  BUN 26* 29* 20   CREATININE 2.28* 1.67* 1.20  CALCIUM 8.9 7.7* 7.5*   Liver Function Tests:  Recent Labs Lab 12/01/15 1632 12/02/15 0548  AST 39 26  ALT 21 12*  ALKPHOS 106 68  BILITOT 1.3* 1.3*  PROT 7.1 5.6*  ALBUMIN 3.9 2.9*    Recent Labs Lab 12/01/15 1632  LIPASE 26   No results for input(s): AMMONIA in the last 168 hours. CBC:  Recent Labs Lab 12/01/15 1632 12/02/15 0548 12/03/15 0540  WBC 8.5 7.0 7.1  NEUTROABS 7.7 5.7 6.1  HGB 18.8* 14.9 13.2  HCT 56.7* 46.1 40.9  MCV 109.5* 109.8* 111.4*  PLT 308 187 181   Cardiac Enzymes: No results for input(s): CKTOTAL, CKMB, CKMBINDEX, TROPONINI in the last 168 hours. BNP (last 3 results) No results for input(s): BNP in the last 8760 hours.  ProBNP (last 3 results) No results for input(s): PROBNP in the last 8760 hours.  CBG:  Recent Labs Lab 12/02/15 2023 12/03/15 0013 12/03/15 0428 12/03/15 0741 12/03/15 1146  GLUCAP 82 82 88 76 105*    Recent Results (from the past 240 hour(s))  Culture, Urine     Status: None   Collection Time: 12/02/15  5:30 AM  Result Value Ref Range Status   Specimen Description URINE, RANDOM  Final   Special Requests NONE  Final   Culture   Final    5,000 COLONIES/mL INSIGNIFICANT GROWTH Performed at [facility]    Report Status 12/03/2015 FINAL  Final  Culture, blood (Routine X 2) w Reflex to ID Panel     Status: None (Preliminary result)   Collection Time: 12/02/15  5:48 AM  Result Value Ref Range Status   Specimen Description BLOOD RIGHT HAND  Final   Special Requests IN PEDIATRIC BOTTLE 1.5CC  Final   Culture   Final    NO GROWTH 1 DAY Performed at [facility]    Report Status PENDING  Incomplete  Culture, blood (Routine X 2) w Reflex to ID Panel     Status: None (Preliminary result)   Collection Time: 12/02/15  5:48 AM  Result Value Ref Range Status   Specimen Description BLOOD RIGHT FOREARM  Final   Special Requests IN PEDIATRIC BOTTLE [place]  Final   Culture    Final    NO GROWTH 1 DAY Performed at [facility]    Report Status PENDING  Incomplete     Studies: Ct Abdomen Pelvis Wo Contrast  12/01/2015  CLINICAL  DATA:  Upper abdominal discomfort made worse by eating. EXAM: CT ABDOMEN AND PELVIS WITHOUT CONTRAST TECHNIQUE: Multidetector CT imaging of the abdomen and pelvis was performed following the standard protocol without IV contrast. COMPARISON:  Multiple priors, most recent 01/05/2015. FINDINGS: Lower chest: BILATERAL lower lobe opacities, likely aspiration secondary to nausea and vomiting. Hepatobiliary: No mass visualized on this un-enhanced exam. Gallstones. Pancreas: No mass or inflammatory process identified on this un-enhanced exam. Spleen: Within normal limits in size. Adrenals/Urinary Tract: No evidence of urolithiasis or hydronephrosis. No definite mass visualized on this un-enhanced exam. Stomach/Bowel: Markedly distended stomach, and small bowel. Partial/RIGHT hemicolectomy. Severe distal small bowel obstruction. Transition zone LEFT mid abdomen, likely secondary to adhesions. Free fluid in the pelvis without signs of pneumatosis or perforation. Vascular/Lymphatic: No pathologically enlarged lymph nodes. No evidence of abdominal aortic aneurysm. Reproductive: No mass or other significant abnormality. Normal prostate. Musculoskeletal:  No suspicious bone lesions identified. IMPRESSION: High-grade small bowel obstruction, likely due to adhesions. No pneumoperitoneum. Small amount of free fluid pelvis. Previous partial colectomy. BILATERAL lower lobe opacities, likely aspiration for nausea and vomiting. Gallstones. Electronically Signed   By: Staci Righter M.D.   On: 12/01/2015 18:51   Dg Chest Port 1 View  12/02/2015  CLINICAL DATA:  Followup of small bowel obstruction, sepsis. EXAM: PORTABLE CHEST 1 VIEW COMPARISON:  Chest CT 03/31/2014.  Abdominal CT of 12/01/2015. FINDINGS: nasogastric tube extends beyond the inferior aspect of the film.  Midline trachea. Cardiomegaly accentuated by AP portable technique. Probable small left pleural effusion. No pneumothorax. Patchy bibasilar airspace disease. Low lung volumes with resultant pulmonary interstitial prominence. IMPRESSION: Bibasilar airspace disease, suspicious for pneumonia or aspiration. Similar to the prior CT. Cardiomegaly and mildly low lung volumes. Electronically Signed   By: Abigail Miyamoto M.D.   On: 12/02/2015 10:35   Dg Abd 2 Views  12/03/2015  CLINICAL DATA:  Followup small-bowel obstruction EXAM: ABDOMEN - 2 VIEW COMPARISON:  12/02/2015 FINDINGS: The small bowel dilatation is very similar to the prior study with small bowel loops measuring up to 6 cm. Proximal to mid small bowel is distended with decompressed distal small bowel and decompressed colon. There is air seen in to the distal colon. An NG tube projects over the anticipated position of the proximal duodenum. A tiny volume of contrast is seen within the stomach. The dilated loops show air-fluid levels. There is no evidence of pneumoperitoneum. IMPRESSION: Incomplete but relatively high-grade small-bowel obstruction very similar to prior study. Electronically Signed   By: Skipper Cliche M.D.   On: 12/03/2015 08:37   Dg Abd 2 Views  12/02/2015  CLINICAL DATA:  Small bowel obstruction. EXAM: ABDOMEN - 2 VIEW COMPARISON:  None. FINDINGS: Nasogastric tube is in the stomach which is decompressed. Significant small bowel obstruction remains. The exact site of obstruction is not observed on these radiographs. There is no free air. IMPRESSION: Small bowel obstruction persists. Electronically Signed   By: Staci Righter M.D.   On: 12/02/2015 22:25   Dg Abd Portable 1v-small Bowel Obstruction Protocol-initial, 8 Hr Delay  12/02/2015  CLINICAL DATA:  24 hour small bowel protocol film, patient states abdomen still distended, suction on. EXAM: PORTABLE ABDOMEN - 1 VIEW COMPARISON:  12/01/2015 FINDINGS: Nasogastric tube is in place, tip  overlying the level of the distal stomach. There has been interval decompression of the stomach. Small amount of contrast is identified the gastric fundus. There is persistent significant dilatation of numerous small bowel loops. Gas is identified in nondilated large  bowel loops. No evidence for free intraperitoneal air on this supine views provided. Bowel sutures are identified in the right upper quadrant the abdomen. Degenerative changes are seen in the spine and hips. IMPRESSION: 1. Persistent high-grade small bowel obstruction. 2. Interval decompression of the stomach by nasogastric tube. Electronically Signed   By: Nolon Nations M.D.   On: 12/02/2015 09:49   Dg Abd Portable 1v-small Bowel Protocol-position Verification  12/01/2015  CLINICAL DATA:  Small bowel protocol, evaluate NGT. EXAM: PORTABLE ABDOMEN - 1 VIEW COMPARISON:  12/01/2015. FINDINGS: A gastric tube has been inserted. The last side hole is near the gastroesophageal junction. The tip of the tube lies within the stomach. The tube could be advanced several cm. There is marked gaseous distention of the stomach and small bowel. IMPRESSION: Nasogastric tube tip lies within the stomach but could be advanced several cm. Electronically Signed   By: Staci Righter M.D.   On: 12/01/2015 21:52    Scheduled Meds:  Scheduled Meds: . chlorhexidine  15 mL Mouth Rinse BID  . dextrose  1 ampule Intravenous Once  . heparin subcutaneous  5,000 Units Subcutaneous 3 times per day  . levETIRAcetam  750 mg Intravenous Q12H  . lip balm  1 application Topical BID  . neomycin-bacitracin-polymyxin   Topical Daily  . piperacillin-tazobactam (ZOSYN)  IV  3.375 g Intravenous 3 times per day  . vancomycin  750 mg Intravenous Q12H   Continuous Infusions: . dextrose 5 % and 0.9% NaCl 150 mL/hr at 12/03/15 1132    Time spent on care of this patient: 35 min   [place], MD 12/03/2015, 2:51 PM  LOS: 2 days   [facility] Office   [phone] Pager - Text Page per [email or web address] If 7PM-7AM, please contact night-coverage [email or web address]

## 2015-12-03 NOTE — Progress Notes (Signed)
[facility] Progress Note  Subjective: Abd films this am with ongoing high grade obstruction. Pt denies pain. Has not passed gas or BM.3900 from NG.      Objective:  Vital signs in last 24 hours: Temp:  [98.2 F (36.8 C)-99.2 F (37.3 C)] 98.6 F (37 C) (02/10 0845) Pulse Rate:  [86-105] 86 (02/10 0845) Resp:  [17-20] 17 (02/10 0845) BP: (103-124)/(49-56) 124/53 mmHg (02/10 0845) SpO2:  [91 %-96 %] 93 % (02/10 0845) Last BM Date: 11/28/15 General:   Alert,  Well-developed,    in NAD Heart:  Regular rate and rhythm; no murmurs Pulm;lungs clear Abdomen:  Soft, nontender, mild distention, diminishedbowel sounds, without guarding, and without rebound.    Intake/Output from previous day: 02/09 0701 - 02/10 0700 In: 3739.2 [I.V.:2744.2; NG/GT:240; IV Piggyback:515] Out: 5000 [Urine:1100; Emesis/NG output:3900] Intake/Output this shift:    Lab Results:  Recent Labs  12/01/15 1632 12/02/15 0548  WBC 8.5 7.0  HGB 18.8* 14.9  HCT 56.7* 46.1  PLT 308 187   BMET  Recent Labs  12/01/15 1632 12/02/15 0548 12/03/15 0540  NA 139 141 138  K 4.2 4.9 3.6  CL 98* 105 107  CO2 25 24 23   GLUCOSE 123* 71 101*  BUN 26* 29* 20  CREATININE 2.28* 1.67* 1.20  CALCIUM 8.9 7.7* 7.5*   LFT  Recent Labs  12/02/15 0548  PROT 5.6*  ALBUMIN 2.9*  AST 26  ALT 12*  ALKPHOS 68  BILITOT 1.3*     Hepatitis Panel  Recent Labs  12/02/15 1222  HCVAB <0.1    Ct Abdomen Pelvis Wo Contrast  12/01/2015  CLINICAL DATA:  Upper abdominal discomfort made worse by eating. EXAM: CT ABDOMEN AND PELVIS WITHOUT CONTRAST TECHNIQUE: Multidetector CT imaging of the abdomen and pelvis was performed following the standard protocol without IV contrast. COMPARISON:  Multiple priors, most recent 01/05/2015. FINDINGS: Lower chest: BILATERAL lower lobe opacities, likely aspiration secondary to nausea and vomiting. Hepatobiliary: No mass visualized on this un-enhanced exam. Gallstones.  Pancreas: No mass or inflammatory process identified on this un-enhanced exam. Spleen: Within normal limits in size. Adrenals/Urinary Tract: No evidence of urolithiasis or hydronephrosis. No definite mass visualized on this un-enhanced exam. Stomach/Bowel: Markedly distended stomach, and small bowel. Partial/RIGHT hemicolectomy. Severe distal small bowel obstruction. Transition zone LEFT mid abdomen, likely secondary to adhesions. Free fluid in the pelvis without signs of pneumatosis or perforation. Vascular/Lymphatic: No pathologically enlarged lymph nodes. No evidence of abdominal aortic aneurysm. Reproductive: No mass or other significant abnormality. Normal prostate. Musculoskeletal:  No suspicious bone lesions identified. IMPRESSION: High-grade small bowel obstruction, likely due to adhesions. No pneumoperitoneum. Small amount of free fluid pelvis. Previous partial colectomy. BILATERAL lower lobe opacities, likely aspiration for nausea and vomiting. Gallstones. Electronically Signed   By: Staci Righter M.D.   On: 12/01/2015 18:51   Dg Chest Port 1 View  12/02/2015  CLINICAL DATA:  Followup of small bowel obstruction, sepsis. EXAM: PORTABLE CHEST 1 VIEW COMPARISON:  Chest CT 03/31/2014.  Abdominal CT of 12/01/2015. FINDINGS: nasogastric tube extends beyond the inferior aspect of the film. Midline trachea. Cardiomegaly accentuated by AP portable technique. Probable small left pleural effusion. No pneumothorax. Patchy bibasilar airspace disease. Low lung volumes with resultant pulmonary interstitial prominence. IMPRESSION: Bibasilar airspace disease, suspicious for pneumonia or aspiration. Similar to the prior CT. Cardiomegaly and mildly low lung volumes. Electronically Signed   By: Abigail Miyamoto M.D.   On: 12/02/2015 10:35   Dg Abd  2 Views  12/03/2015  CLINICAL DATA:  Followup small-bowel obstruction EXAM: ABDOMEN - 2 VIEW COMPARISON:  12/02/2015 FINDINGS: The small bowel dilatation is very similar to the  prior study with small bowel loops measuring up to 6 cm. Proximal to mid small bowel is distended with decompressed distal small bowel and decompressed colon. There is air seen in to the distal colon. An NG tube projects over the anticipated position of the proximal duodenum. A tiny volume of contrast is seen within the stomach. The dilated loops show air-fluid levels. There is no evidence of pneumoperitoneum. IMPRESSION: Incomplete but relatively high-grade small-bowel obstruction very similar to prior study. Electronically Signed   By: Skipper Cliche M.D.   On: 12/03/2015 08:37   Dg Abd 2 Views  12/02/2015  CLINICAL DATA:  Small bowel obstruction. EXAM: ABDOMEN - 2 VIEW COMPARISON:  None. FINDINGS: Nasogastric tube is in the stomach which is decompressed. Significant small bowel obstruction remains. The exact site of obstruction is not observed on these radiographs. There is no free air. IMPRESSION: Small bowel obstruction persists. Electronically Signed   By: Staci Righter M.D.   On: 12/02/2015 22:25   Dg Abd Portable 1v-small Bowel Obstruction Protocol-initial, 8 Hr Delay  12/02/2015  CLINICAL DATA:  24 hour small bowel protocol film, patient states abdomen still distended, suction on. EXAM: PORTABLE ABDOMEN - 1 VIEW COMPARISON:  12/01/2015 FINDINGS: Nasogastric tube is in place, tip overlying the level of the distal stomach. There has been interval decompression of the stomach. Small amount of contrast is identified the gastric fundus. There is persistent significant dilatation of numerous small bowel loops. Gas is identified in nondilated large bowel loops. No evidence for free intraperitoneal air on this supine views provided. Bowel sutures are identified in the right upper quadrant the abdomen. Degenerative changes are seen in the spine and hips. IMPRESSION: 1. Persistent high-grade small bowel obstruction. 2. Interval decompression of the stomach by nasogastric tube. Electronically Signed   By:  Nolon Nations M.D.   On: 12/02/2015 09:49   Dg Abd Portable 1v-small Bowel Protocol-position Verification  12/01/2015  CLINICAL DATA:  Small bowel protocol, evaluate NGT. EXAM: PORTABLE ABDOMEN - 1 VIEW COMPARISON:  12/01/2015. FINDINGS: A gastric tube has been inserted. The last side hole is near the gastroesophageal junction. The tip of the tube lies within the stomach. The tube could be advanced several cm. There is marked gaseous distention of the stomach and small bowel. IMPRESSION: Nasogastric tube tip lies within the stomach but could be advanced several cm. Electronically Signed   By: Staci Righter M.D.   On: 12/01/2015 21:52    ASSESSMENT/PLAN:  60 yo male with SBO either due to adhesions or Crohns, less likely Crohns. Abd films with ongoing high grade obstruction. Cont IVF, NGT. May need surgery. Principal Problem:   SIRS (systemic inflammatory response syndrome) (HCC) Active Problems:   Crohn's disease ([place])   Personal history of immunosupression therapy   Seizure disorder ([place])   SBO (small bowel obstruction) ([place])   S/P partial colectomy for Crohn's disease   ARF (acute renal failure) ([place])     LOS: 2 days   Hvozdovic, Vita Barley PA-C 12/03/2015, Pager [phone] Mon-Fri 8a-5p [phone] after 5p, weekends, holidays   Attending physician's note   I have taken an interval history, reviewed the chart and examined the patient. I agree with the Advanced Practitioner's note, impression and recommendations.   Damaris Hippo, MD [phone] Mon-Fri 8a-5p [phone] after 5p, weekends, holidays

## 2015-12-04 ENCOUNTER — Inpatient Hospital Stay (HOSPITAL_COMMUNITY): Payer: Medicare Other | Admitting: Anesthesiology

## 2015-12-04 ENCOUNTER — Encounter (HOSPITAL_COMMUNITY)
Admission: EM | Disposition: A | Discharge status: Skilled Nursing Facility | Payer: Self-pay | Source: Home / Self Care | Attending: Internal Medicine

## 2015-12-04 ENCOUNTER — Encounter (HOSPITAL_COMMUNITY): Payer: Self-pay | Admitting: Registered Nurse

## 2015-12-04 DIAGNOSIS — K50918 Crohn's disease, unspecified, with other complication: Secondary | ICD-10-CM

## 2015-12-04 DIAGNOSIS — Z9049 Acquired absence of other specified parts of digestive tract: Secondary | ICD-10-CM

## 2015-12-04 HISTORY — PX: LAPAROSCOPY: SHX197

## 2015-12-04 LAB — CBC
HEMATOCRIT: 40.2 % (ref 39.0–52.0)
Hemoglobin: 13 g/dL (ref 13.0–17.0)
MCH: 35.5 pg — AB (ref 26.0–34.0)
MCHC: 32.3 g/dL (ref 30.0–36.0)
MCV: 109.8 fL — AB (ref 78.0–100.0)
PLATELETS: 170 10*3/uL (ref 150–400)
RBC: 3.66 MIL/uL — ABNORMAL LOW (ref 4.22–5.81)
RDW: 13.7 % (ref 11.5–15.5)
WBC: 6.9 10*3/uL (ref 4.0–10.5)

## 2015-12-04 LAB — BASIC METABOLIC PANEL
Anion gap: 7 (ref 5–15)
BUN: 14 mg/dL (ref 6–20)
CHLORIDE: 111 mmol/L (ref 101–111)
CO2: 22 mmol/L (ref 22–32)
CREATININE: 0.97 mg/dL (ref 0.61–1.24)
Calcium: 7.8 mg/dL — ABNORMAL LOW (ref 8.9–10.3)
GFR calc Af Amer: >60 mL/min (ref >60)
GFR calc non Af Amer: >60 mL/min (ref >60)
GLUCOSE: 110 mg/dL — AB (ref 65–99)
POTASSIUM: 3.7 mmol/L (ref 3.5–5.1)
Sodium: 140 mmol/L (ref 135–145)

## 2015-12-04 LAB — GLUCOSE, CAPILLARY
GLUCOSE-CAPILLARY: 105 mg/dL — AB (ref 65–99)
GLUCOSE-CAPILLARY: 129 mg/dL — AB (ref 65–99)

## 2015-12-04 LAB — HEPATITIS B SURFACE ANTIGEN: Hepatitis B Surface Ag: NEGATIVE

## 2015-12-04 LAB — PROCALCITONIN: PROCALCITONIN: 0.67 ng/mL

## 2015-12-04 LAB — HIGH SENSITIVITY CRP: CRP, High Sensitivity: >300 mg/L — AB (ref 0.00–3.00)

## 2015-12-04 SURGERY — LAPAROSCOPY, DIAGNOSTIC
Anesthesia: General | Site: Abdomen

## 2015-12-04 MED ORDER — PIPERACILLIN-TAZOBACTAM 3.375 G IVPB
INTRAVENOUS | Status: AC
  Filled 2015-12-04: qty 50

## 2015-12-04 MED ORDER — PROPOFOL 10 MG/ML IV BOLUS
INTRAVENOUS | Status: AC
Start: 2015-12-04 — End: 2015-12-04
  Filled 2015-12-04: qty 20

## 2015-12-04 MED ORDER — EPHEDRINE SULFATE 50 MG/ML IJ SOLN
INTRAMUSCULAR | Status: AC
  Filled 2015-12-04: qty 1

## 2015-12-04 MED ORDER — EPHEDRINE SULFATE 50 MG/ML IJ SOLN
INTRAMUSCULAR | Status: DC | PRN
  Administered 2015-12-04 (×3): 5 mg via INTRAVENOUS

## 2015-12-04 MED ORDER — LACTATED RINGERS IV SOLN: INTRAVENOUS | Status: DC

## 2015-12-04 MED ORDER — ROCURONIUM BROMIDE 100 MG/10ML IV SOLN
INTRAVENOUS | Status: AC
  Filled 2015-12-04: qty 1

## 2015-12-04 MED ORDER — FENTANYL CITRATE (PF) 250 MCG/5ML IJ SOLN
INTRAMUSCULAR | Status: AC
  Filled 2015-12-04: qty 5

## 2015-12-04 MED ORDER — LACTATED RINGERS IV SOLN
INTRAVENOUS | Status: DC | PRN
  Administered 2015-12-04: 08:00:00 via INTRAVENOUS

## 2015-12-04 MED ORDER — OXYCODONE HCL 5 MG/5ML PO SOLN
5.0000 mg | Freq: Once | ORAL | Status: DC | PRN
  Filled 2015-12-04: qty 5

## 2015-12-04 MED ORDER — MIDAZOLAM HCL 2 MG/2ML IJ SOLN
INTRAMUSCULAR | Status: AC
  Filled 2015-12-04: qty 2

## 2015-12-04 MED ORDER — ATROPINE SULFATE 0.4 MG/ML IJ SOLN
INTRAMUSCULAR | Status: AC
  Filled 2015-12-04: qty 2

## 2015-12-04 MED ORDER — BUPIVACAINE-EPINEPHRINE (PF) 0.25% -1:200000 IJ SOLN
INTRAMUSCULAR | Status: AC
  Filled 2015-12-04: qty 30

## 2015-12-04 MED ORDER — DEXAMETHASONE SODIUM PHOSPHATE 10 MG/ML IJ SOLN
INTRAMUSCULAR | Status: AC
  Filled 2015-12-04: qty 1

## 2015-12-04 MED ORDER — ONDANSETRON HCL 4 MG/2ML IJ SOLN: 4.0000 mg | Freq: Four times a day (QID) | INTRAMUSCULAR | Status: DC | PRN

## 2015-12-04 MED ORDER — ONDANSETRON HCL 4 MG/2ML IJ SOLN
INTRAMUSCULAR | Status: AC
  Filled 2015-12-04: qty 2

## 2015-12-04 MED ORDER — MEPERIDINE HCL 50 MG/ML IJ SOLN: 6.2500 mg | INTRAMUSCULAR | Status: DC | PRN

## 2015-12-04 MED ORDER — LIDOCAINE HCL (CARDIAC) 20 MG/ML IV SOLN
INTRAVENOUS | Status: AC
  Filled 2015-12-04: qty 5

## 2015-12-04 MED ORDER — HYDROMORPHONE HCL 1 MG/ML IJ SOLN
INTRAMUSCULAR | Status: AC
  Filled 2015-12-04: qty 1

## 2015-12-04 MED ORDER — MIDAZOLAM HCL 5 MG/5ML IJ SOLN
INTRAMUSCULAR | Status: DC | PRN
  Administered 2015-12-04 (×2): 1 mg via INTRAVENOUS

## 2015-12-04 MED ORDER — DEXAMETHASONE SODIUM PHOSPHATE 10 MG/ML IJ SOLN
INTRAMUSCULAR | Status: DC | PRN
  Administered 2015-12-04: 10 mg via INTRAVENOUS

## 2015-12-04 MED ORDER — HYDROMORPHONE HCL 1 MG/ML IJ SOLN
0.2500 mg | INTRAMUSCULAR | Status: DC | PRN
  Administered 2015-12-04 (×2): 0.5 mg via INTRAVENOUS

## 2015-12-04 MED ORDER — BUPIVACAINE-EPINEPHRINE 0.25% -1:200000 IJ SOLN
INTRAMUSCULAR | Status: DC | PRN
  Administered 2015-12-04: 1 mL

## 2015-12-04 MED ORDER — 0.9 % SODIUM CHLORIDE (POUR BTL) OPTIME
TOPICAL | Status: DC | PRN
  Administered 2015-12-04 (×4): 1000 mL

## 2015-12-04 MED ORDER — OXYCODONE HCL 5 MG PO TABS: 5.0000 mg | ORAL_TABLET | Freq: Once | ORAL | Status: DC | PRN

## 2015-12-04 MED ORDER — PROPOFOL 10 MG/ML IV BOLUS
INTRAVENOUS | Status: DC | PRN
  Administered 2015-12-04: 200 mg via INTRAVENOUS

## 2015-12-04 MED ORDER — LACTATED RINGERS IR SOLN
Status: DC | PRN
  Administered 2015-12-04 (×2): 1

## 2015-12-04 MED ORDER — LACTATED RINGERS IV SOLN
INTRAVENOUS | Status: DC | PRN
  Administered 2015-12-04: 07:00:00 via INTRAVENOUS

## 2015-12-04 MED ORDER — ROCURONIUM BROMIDE 100 MG/10ML IV SOLN
INTRAVENOUS | Status: DC | PRN
  Administered 2015-12-04: 20 mg via INTRAVENOUS
  Administered 2015-12-04 (×6): 10 mg via INTRAVENOUS
  Administered 2015-12-04: 40 mg via INTRAVENOUS
  Administered 2015-12-04: 10 mg via INTRAVENOUS

## 2015-12-04 MED ORDER — HYDROMORPHONE HCL 2 MG/ML IJ SOLN
INTRAMUSCULAR | Status: AC
  Filled 2015-12-04: qty 1

## 2015-12-04 MED ORDER — ONDANSETRON HCL 4 MG/2ML IJ SOLN
INTRAMUSCULAR | Status: DC | PRN
  Administered 2015-12-04: 4 mg via INTRAVENOUS

## 2015-12-04 MED ORDER — SUCCINYLCHOLINE CHLORIDE 20 MG/ML IJ SOLN
INTRAMUSCULAR | Status: DC | PRN
  Administered 2015-12-04: 100 mg via INTRAVENOUS

## 2015-12-04 MED ORDER — FENTANYL CITRATE (PF) 100 MCG/2ML IJ SOLN
INTRAMUSCULAR | Status: DC | PRN
  Administered 2015-12-04 (×3): 50 ug via INTRAVENOUS
  Administered 2015-12-04: 100 ug via INTRAVENOUS

## 2015-12-04 MED ORDER — SODIUM CHLORIDE 0.9 % IV BOLUS (SEPSIS)
250.0000 mL | Freq: Once | INTRAVENOUS | Status: AC
  Administered 2015-12-04: 250 mL via INTRAVENOUS

## 2015-12-04 MED ORDER — LIDOCAINE HCL (CARDIAC) 20 MG/ML IV SOLN
INTRAVENOUS | Status: DC | PRN
  Administered 2015-12-04: 100 mg via INTRAVENOUS

## 2015-12-04 MED ORDER — MORPHINE SULFATE (PF) 2 MG/ML IV SOLN
1.0000 mg | INTRAVENOUS | Status: DC | PRN
  Administered 2015-12-04 – 2015-12-13 (×23): 2 mg via INTRAVENOUS
  Filled 2015-12-04 (×23): qty 1

## 2015-12-04 MED ORDER — SUGAMMADEX SODIUM 200 MG/2ML IV SOLN
INTRAVENOUS | Status: AC
  Filled 2015-12-04: qty 2

## 2015-12-04 MED ORDER — SODIUM CHLORIDE 0.9 % IV SOLN
1250.0000 mg | Freq: Two times a day (BID) | INTRAVENOUS | Status: DC
  Administered 2015-12-04: 1250 mg via INTRAVENOUS
  Filled 2015-12-04 (×2): qty 1250

## 2015-12-04 MED ORDER — SUGAMMADEX SODIUM 200 MG/2ML IV SOLN
INTRAVENOUS | Status: DC | PRN
  Administered 2015-12-04: 200 mg via INTRAVENOUS

## 2015-12-04 MED ORDER — PROMETHAZINE HCL 25 MG/ML IJ SOLN: 6.2500 mg | INTRAMUSCULAR | Status: DC | PRN

## 2015-12-04 MED ORDER — SODIUM CHLORIDE 0.9 % IJ SOLN
INTRAMUSCULAR | Status: AC
  Filled 2015-12-04: qty 10

## 2015-12-04 MED ORDER — HYDROMORPHONE HCL 1 MG/ML IJ SOLN
INTRAMUSCULAR | Status: DC | PRN
  Administered 2015-12-04: .2 mg via INTRAVENOUS
  Administered 2015-12-04: .4 mg via INTRAVENOUS
  Administered 2015-12-04: .2 mg via INTRAVENOUS

## 2015-12-04 SURGICAL SUPPLY — 65 items
APL SKNCLS STERI-STRIP NONHPOA (GAUZE/BANDAGES/DRESSINGS)
APPLICATOR COTTON TIP 6IN STRL (MISCELLANEOUS) ×5 IMPLANT
BENZOIN TINCTURE PRP APPL 2/3 (GAUZE/BANDAGES/DRESSINGS) IMPLANT
BLADE EXTENDED COATED 6.5IN (ELECTRODE) ×2 IMPLANT
BLADE HEX COATED 2.75 (ELECTRODE) ×3 IMPLANT
CABLE HIGH FREQUENCY MONO STRZ (ELECTRODE) ×2 IMPLANT
CHLORAPREP W/TINT 26ML (MISCELLANEOUS) ×3 IMPLANT
COVER MAYO STAND STRL (DRAPES) ×2 IMPLANT
COVER SURGICAL LIGHT HANDLE (MISCELLANEOUS) ×1 IMPLANT
DECANTER SPIKE VIAL GLASS SM (MISCELLANEOUS) IMPLANT
DRAPE LAPAROSCOPIC ABDOMINAL (DRAPES) ×3 IMPLANT
DRAPE WARM FLUID 44X44 (DRAPE) ×2 IMPLANT
ELECT REM PT RETURN 9FT ADLT (ELECTROSURGICAL) ×3
ELECTRODE REM PT RTRN 9FT ADLT (ELECTROSURGICAL) ×2 IMPLANT
GAUZE SPONGE 4X4 12PLY STRL (GAUZE/BANDAGES/DRESSINGS) ×3 IMPLANT
GLOVE BIOGEL PI IND STRL 7.0 (GLOVE) ×4 IMPLANT
GLOVE BIOGEL PI INDICATOR 7.0 (GLOVE) ×3
GLOVE SURG SIGNA 7.5 PF LTX (GLOVE) ×7 IMPLANT
GOWN SPEC L4 XLG W/TWL (GOWN DISPOSABLE) ×3 IMPLANT
GOWN STRL REUS W/ TWL XL LVL3 (GOWN DISPOSABLE) ×9 IMPLANT
GOWN STRL REUS W/TWL LRG LVL3 (GOWN DISPOSABLE) ×7 IMPLANT
GOWN STRL REUS W/TWL XL LVL3 (GOWN DISPOSABLE) ×27 IMPLANT
HANDLE SUCTION POOLE (INSTRUMENTS) ×2 IMPLANT
KIT BASIN OR (CUSTOM PROCEDURE TRAY) ×3 IMPLANT
LIQUID BAND (GAUZE/BANDAGES/DRESSINGS) ×2 IMPLANT
NS IRRIG 1000ML POUR BTL (IV SOLUTION) ×9 IMPLANT
PACK GENERAL/GYN (CUSTOM PROCEDURE TRAY) ×3 IMPLANT
RELOAD PROXIMATE 75MM BLUE (ENDOMECHANICALS) ×12 IMPLANT
RELOAD STAPLE 75 3.8 BLU REG (ENDOMECHANICALS) IMPLANT
SET IRRIG TUBING LAPAROSCOPIC (IRRIGATION / IRRIGATOR) ×2 IMPLANT
SHEARS HARMONIC ACE PLUS 36CM (ENDOMECHANICALS) ×2 IMPLANT
SLEEVE ADV FIXATION 5X100MM (TROCAR) ×4 IMPLANT
SLEEVE XCEL OPT CAN 5 100 (ENDOMECHANICALS) ×3 IMPLANT
SPONGE LAP 18X18 X RAY DECT (DISPOSABLE) ×4 IMPLANT
STAPLER PROXIMATE 75MM BLUE (STAPLE) ×2 IMPLANT
STAPLER VISISTAT 35W (STAPLE) ×3 IMPLANT
STRIP CLOSURE SKIN 1/2X4 (GAUZE/BANDAGES/DRESSINGS) IMPLANT
SUCTION POOLE HANDLE (INSTRUMENTS) ×6
SUT MNCRL AB 4-0 PS2 18 (SUTURE) ×5 IMPLANT
SUT NOVA NAB DX-16 0-1 5-0 T12 (SUTURE) ×4 IMPLANT
SUT NOVA T20/GS 25 (SUTURE) ×4 IMPLANT
SUT PDS AB 1 CTX 36 (SUTURE) IMPLANT
SUT SILK 2 0 (SUTURE) ×3
SUT SILK 2 0 SH CR/8 (SUTURE) ×4 IMPLANT
SUT SILK 2-0 18XBRD TIE 12 (SUTURE) ×1 IMPLANT
SUT SILK 3 0 (SUTURE)
SUT SILK 3 0 SH CR/8 (SUTURE) ×4 IMPLANT
SUT SILK 3-0 18XBRD TIE 12 (SUTURE) IMPLANT
SUT VIC AB 2-0 SH 18 (SUTURE) ×2 IMPLANT
SUT VIC AB 3-0 SH 8-18 (SUTURE) ×6 IMPLANT
SUT VICRYL 2 0 18  UND BR (SUTURE) ×1
SUT VICRYL 2 0 18 UND BR (SUTURE) ×1 IMPLANT
SYS LAPSCP GELPORT 120MM (MISCELLANEOUS) ×6
SYSTEM LAPSCP GELPORT 120MM (MISCELLANEOUS) ×2 IMPLANT
TAPE CLOTH SURG 4X10 WHT LF (GAUZE/BANDAGES/DRESSINGS) ×2 IMPLANT
TOWEL OR 17X26 10 PK STRL BLUE (TOWEL DISPOSABLE) ×6 IMPLANT
TOWEL OR NON WOVEN STRL DISP B (DISPOSABLE) ×3 IMPLANT
TRAY FOLEY W/METER SILVER 14FR (SET/KITS/TRAYS/PACK) IMPLANT
TRAY FOLEY W/METER SILVER 16FR (SET/KITS/TRAYS/PACK) IMPLANT
TRAY LAPAROSCOPIC (CUSTOM PROCEDURE TRAY) ×3 IMPLANT
TROCAR BLADELESS OPT 5 100 (ENDOMECHANICALS) ×3 IMPLANT
TROCAR XCEL BLUNT TIP 100MML (ENDOMECHANICALS) ×2 IMPLANT
TROCAR XCEL NON-BLD 11X100MML (ENDOMECHANICALS) IMPLANT
WATER STERILE IRR 1500ML POUR (IV SOLUTION) ×1 IMPLANT
YANKAUER SUCT BULB TIP NO VENT (SUCTIONS) ×4 IMPLANT

## 2015-12-04 NOTE — Anesthesia Postprocedure Evaluation (Signed)
Anesthesia Post Note  Patient: OMARR HANN  Procedure(s) Performed: Procedure(s) (LRB): LAPAROSCOPY DIAGNOSTIC, LAPAROSCOPIC INTEROLYSIS OF ADHESIONS, HAND- ASSISTED SMALL BOWEL RESECTION, MOBILIZATION OF SPLENIC FLEXURE, LEFT COLECTOMY WITH CREATION OF COLOSTOMY (HARTMAN'S PROCEDURE) (N/A)  Patient location during evaluation: PACU Anesthesia Type: General Level of consciousness: awake and alert Pain management: pain level controlled Vital Signs Assessment: post-procedure vital signs reviewed and stable Respiratory status: spontaneous breathing, nonlabored ventilation and respiratory function stable Cardiovascular status: blood pressure returned to baseline and stable Postop Assessment: no signs of nausea or vomiting Anesthetic complications: no    Last Vitals:  Filed Vitals:   12/04/15 1426 12/04/15 1430  BP: 155/64 148/59  Pulse: 93 93  Temp:    Resp: 17 17    Last Pain:  Filed Vitals:   12/04/15 1434  PainSc: 5                  Tarri Guilfoil A

## 2015-12-04 NOTE — Anesthesia Procedure Notes (Signed)
Procedure Name: Intubation Date/Time: 12/04/2015 8:00 AM Performed by: Carleene Cooper A Pre-anesthesia Checklist: Patient identified, Timeout performed, Emergency Drugs available, Suction available and Patient being monitored Patient Re-evaluated:Patient Re-evaluated prior to inductionOxygen Delivery Method: Circle system utilized Preoxygenation: Pre-oxygenation with 100% oxygen Intubation Type: Rapid sequence, Cricoid Pressure applied and IV induction Laryngoscope Size: Mac and 4 Grade View: Grade I Tube type: Oral Tube size: 7.5 mm Number of attempts: 1 Airway Equipment and Method: Stylet Placement Confirmation: ETT inserted through vocal cords under direct vision,  positive ETCO2 and breath sounds checked- equal and bilateral Secured at: 21 cm Tube secured with: Tape Dental Injury: Teeth and Oropharynx as per pre-operative assessment  Comments: RSI with cricoid pressure by Dr. Al Corpus. ATOI. +ETCO2. =BBS

## 2015-12-04 NOTE — Anesthesia Preprocedure Evaluation (Addendum)
Anesthesia Evaluation  Patient identified by MRN, date of birth, ID band Patient awake    Reviewed: Allergy & Precautions, NPO status , Patient's Chart, lab work & pertinent test results  Airway Mallampati: I  TM Distance: >3 FB Neck ROM: Full    Dental  (+) Teeth Intact, Dental Advisory Given   Pulmonary    breath sounds clear to auscultation       Cardiovascular + Valvular Problems/Murmurs (Echo 2015 shows very mild gradient across AV. No stenosis)  Rhythm:Regular Rate:Normal + Systolic murmurs (1/6 SEM)    Neuro/Psych Seizures - (Petit mal but on meds), Well Controlled,     GI/Hepatic   Endo/Other    Renal/GU      Musculoskeletal   Abdominal   Peds  Hematology   Anesthesia Other Findings Mild cognitive impairment but able to answer questions.  Lives in a facility here.  NG tube in place.  Reproductive/Obstetrics                             Anesthesia Physical Anesthesia Plan  ASA: II and emergent  Anesthesia Plan: General   Post-op Pain Management:    Induction: Intravenous, Rapid sequence and Cricoid pressure planned  Airway Management Planned: Oral ETT  Additional Equipment:   Intra-op Plan:   Post-operative Plan: Extubation in OR  Informed Consent: I have reviewed the patients History and Physical, chart, labs and discussed the procedure including the risks, benefits and alternatives for the proposed anesthesia with the patient or authorized representative who has indicated his/her understanding and acceptance.   Dental advisory given  Plan Discussed with: CRNA, Anesthesiologist and Surgeon  Anesthesia Plan Comments:         Anesthesia Quick Evaluation

## 2015-12-04 NOTE — Progress Notes (Signed)
[facility] Progress Note   [name]  [id number]:[id number]  DOB: November 20, 1955  DOA: 12/01/2015 PCP: No PCP Per Patient  Brief narrative: [name] is a 60 y.o. male male with history of seizures, Crohn's disease, mild mental cognitive defect was brought to the ER after patient had multiple episodes of nausea & vomiting. CT abd and pelvis show high grade obstruction. GI tube placed in ER. Also noted to have ARF and severe lactic acidosis.    Subjective: Evaluated s/p surgery - mild sore throat- No abdominal pain ,nausea or vomiting. No fever. No cough, dyspnea, dysuria. No diarrhea.   Assessment/Plan: Principal Problem:   SIRS (systemic inflammatory response syndrome) - due to SBO-    SBO - S/p resection and colostomy- surgery assisting with managment - NPO/ NG tube   Dehydration - will give another N250 cc NS bolus today  - cont to follow closely for dehydration    Crohn's disease  - on sulfasalazine at home- GI consulted - they do not suspect this is a Crohn's flare  Mild hypoglycemia - cont D5NS    Seizure disorder - cont Keppra via IV    ARF (acute renal failure) ([place]) - likely due to dehydration? -renal function has normalized    Antibiotics: Anti-infectives    Start     Dose/Rate Route Frequency Ordered Stop   12/02/15 2000  vancomycin (VANCOCIN) IVPB 750 mg/150 ml premix     750 mg 150 mL/hr over 60 Minutes Intravenous Every 12 hours 12/02/15 0735     12/02/15 1400  piperacillin-tazobactam (ZOSYN) IVPB 3.375 g     3.375 g 12.5 mL/hr over 240 Minutes Intravenous 3 times per day 12/02/15 0459     12/02/15 0800  vancomycin (VANCOCIN) 2,000 mg in sodium chloride 0.9 % 500 mL IVPB     2,000 mg 250 mL/hr over 120 Minutes Intravenous  Once 12/02/15 0730 12/02/15 1043   12/02/15 0500  piperacillin-tazobactam (ZOSYN) IVPB 3.375 g     3.375 g 100 mL/hr over 30 Minutes Intravenous  Once 12/02/15 0457 12/02/15 0647     Code Status:     Code Status  Orders        Start     Ordered   12/02/15 0131  Full code   Continuous     12/02/15 0130    Code Status History    Date Active Date Inactive Code Status Order ID Comments User Context   03/31/2014  5:33 PM 04/02/2014  7:04 PM Full Code [id number]  Janece Canterbury, MD Inpatient    Advance Directive Documentation        Most Recent Value   Type of Advance Directive  Healthcare Power of Attorney [sister [name] is POA (in [place])]   Pre-existing out of facility DNR order (yellow form or pink MOST form)     "MOST" Form in Place?       Family Communication: [name]- cousin Disposition Plan: expect 2-3 day hospital stay at minimal DVT prophylaxis: Heparin Consultants: gen surgery, GI Procedures: none    Objective: There were no vitals filed for this visit.  Intake/Output Summary (Last 24 hours) at 12/04/15 1543 Last data filed at 12/04/15 1445  Gross per 24 hour  Intake 8029.58 ml  Output   2410 ml  Net 5619.58 ml     Vitals Filed Vitals:   12/04/15 1400 12/04/15 1426 12/04/15 1430 12/04/15 1445  BP: 168/65 155/64 148/59   Pulse: 92 93 93   Temp:  98.3 F (36.8 C)  TempSrc:      Resp: 20 17 17    Height:      SpO2: 96% 97% 94%     Exam:  General:  Pt is alert, not in acute distress  HEENT: No icterus, No thrush, oral mucosa moist  Cardiovascular: regular rate and rhythm, S1/S2 No murmur  Respiratory: clear to auscultation bilaterally   Abdomen: Soft, no Bowel sounds, non tender, NG tube draining brown fluid, non distended, no guarding  MSK: No cyanosis or clubbing- no pedal edema   Data Reviewed: Basic Metabolic Panel:  Recent Labs Lab 12/01/15 1632 12/02/15 0548 12/03/15 0540 12/04/15 0558  NA 139 141 138 140  K 4.2 4.9 3.6 3.7  CL 98* 105 107 111  CO2 25 24 23 22   GLUCOSE 123* 71 101* 110*  BUN 26* 29* 20 14  CREATININE 2.28* 1.67* 1.20 0.97  CALCIUM 8.9 7.7* 7.5* 7.8*   Liver Function Tests:  Recent Labs Lab 12/01/15 1632  12/02/15 0548  AST 39 26  ALT 21 12*  ALKPHOS 106 68  BILITOT 1.3* 1.3*  PROT 7.1 5.6*  ALBUMIN 3.9 2.9*    Recent Labs Lab 12/01/15 1632  LIPASE 26   No results for input(s): AMMONIA in the last 168 hours. CBC:  Recent Labs Lab 12/01/15 1632 12/02/15 0548 12/03/15 0540 12/04/15 0558  WBC 8.5 7.0 7.1 6.9  NEUTROABS 7.7 5.7 6.1  --   HGB 18.8* 14.9 13.2 13.0  HCT 56.7* 46.1 40.9 40.2  MCV 109.5* 109.8* 111.4* 109.8*  PLT 308 187 181 170   Cardiac Enzymes: No results for input(s): CKTOTAL, CKMB, CKMBINDEX, TROPONINI in the last 168 hours. BNP (last 3 results) No results for input(s): BNP in the last 8760 hours.  ProBNP (last 3 results) No results for input(s): PROBNP in the last 8760 hours.  CBG:  Recent Labs Lab 12/03/15 1146 12/03/15 1719 12/03/15 1940 12/03/15 2348 12/04/15 0338  GLUCAP 105* 72 83 89 105*    Recent Results (from the past 240 hour(s))  Culture, Urine     Status: None   Collection Time: 12/02/15  5:30 AM  Result Value Ref Range Status   Specimen Description URINE, RANDOM  Final   Special Requests NONE  Final   Culture   Final    5,000 COLONIES/mL INSIGNIFICANT GROWTH Performed at [facility]    Report Status 12/03/2015 FINAL  Final  Culture, blood (Routine X 2) w Reflex to ID Panel     Status: None (Preliminary result)   Collection Time: 12/02/15  5:48 AM  Result Value Ref Range Status   Specimen Description BLOOD RIGHT HAND  Final   Special Requests IN PEDIATRIC BOTTLE 1.5CC  Final   Culture   Final    NO GROWTH 2 DAYS Performed at [facility]    Report Status PENDING  Incomplete  Culture, blood (Routine X 2) w Reflex to ID Panel     Status: None (Preliminary result)   Collection Time: 12/02/15  5:48 AM  Result Value Ref Range Status   Specimen Description BLOOD RIGHT FOREARM  Final   Special Requests IN PEDIATRIC BOTTLE [place]  Final   Culture   Final    NO GROWTH 2 DAYS Performed at [facility]     Report Status PENDING  Incomplete     Studies: Dg Abd 2 Views  12/03/2015  CLINICAL DATA:  Followup small-bowel obstruction EXAM: ABDOMEN - 2 VIEW COMPARISON:  12/02/2015 FINDINGS:  The small bowel dilatation is very similar to the prior study with small bowel loops measuring up to 6 cm. Proximal to mid small bowel is distended with decompressed distal small bowel and decompressed colon. There is air seen in to the distal colon. An NG tube projects over the anticipated position of the proximal duodenum. A tiny volume of contrast is seen within the stomach. The dilated loops show air-fluid levels. There is no evidence of pneumoperitoneum. IMPRESSION: Incomplete but relatively high-grade small-bowel obstruction very similar to prior study. Electronically Signed   By: Skipper Cliche M.D.   On: 12/03/2015 08:37   Dg Abd 2 Views  12/02/2015  CLINICAL DATA:  Small bowel obstruction. EXAM: ABDOMEN - 2 VIEW COMPARISON:  None. FINDINGS: Nasogastric tube is in the stomach which is decompressed. Significant small bowel obstruction remains. The exact site of obstruction is not observed on these radiographs. There is no free air. IMPRESSION: Small bowel obstruction persists. Electronically Signed   By: Staci Righter M.D.   On: 12/02/2015 22:25    Scheduled Meds:  Scheduled Meds: . chlorhexidine  15 mL Mouth Rinse BID  . dextrose  1 ampule Intravenous Once  . heparin subcutaneous  5,000 Units Subcutaneous 3 times per day  . HYDROmorphone      . levETIRAcetam  750 mg Intravenous Q12H  . lip balm  1 application Topical BID  . neomycin-bacitracin-polymyxin   Topical Daily  . piperacillin-tazobactam (ZOSYN)  IV  3.375 g Intravenous 3 times per day  . vancomycin  750 mg Intravenous Q12H   Continuous Infusions: . dextrose 5 % and 0.9% NaCl 150 mL/hr at 12/04/15 1532    Time spent on care of this patient: 35 min   [place], MD 12/04/2015, 3:43 PM  LOS: 3 days   [facility] Office   [phone] Pager - Text Page per [email or web address] If 7PM-7AM, please contact night-coverage [email or web address]

## 2015-12-04 NOTE — Progress Notes (Signed)
Pharmacy Antibiotic Note  [name] is a 60 y.o. male admitted on 2/8 with sepsis secondary to SBO, started on empiric broad spectrum antibiotics and now s/p bowel resection on 2/11.  Pharmacy has been dosing vancomycin and zosyn.   Plan: Due to improved renal fxn, adjust vancomycin to 1283m IV q12h Continue Zosyn 3.375g IV q8h (infuse over 4 hours) F/u renal fxn, clinical course  Height: 5' 11"  (180.3 cm) IBW/kg (Calculated) : 75.3  Temp (24hrs), Avg:98.3 F (36.8 C), Min:97.6 F (36.4 C), Max:99.1 F (37.3 C)   Recent Labs Lab 12/01/15 1632 12/01/15 2118 12/01/15 2314 12/02/15 0548 12/02/15 0711 12/03/15 0540 12/04/15 0558  WBC 8.5  --   --  7.0  --  7.1 6.9  CREATININE 2.28*  --   --  1.67*  --  1.20 0.97  LATICACIDVEN  --  5.64* 5.8*  --  2.7*  --   --     CrCl cannot be calculated (Unknown ideal weight.).    No Known Allergies  Antimicrobials this admission: 2/8 >>zosyn  >> 2/9 >> vancomycin >>    Dose adjustments this admission: 2/11: Vancomycin 7587mq12h--> 125074m12h for improved renal fxn  Microbiology results: 2/9 blood: NGTD 2/9 urine: 5K insignificant growth  Thank you for allowing pharmacy to be a part of this patient's care.  ColRalene BatheharmD, BCPS 12/04/2015, 4:14 PM  Pager: [phone]

## 2015-12-04 NOTE — Transfer of Care (Signed)
Immediate Anesthesia Transfer of Care Note  Patient: [name]  Procedure(s) Performed: Procedure(s): LAPAROSCOPY DIAGNOSTIC, LAPAROSCOPIC INTEROLYSIS OF ADHESIONS, HAND- ASSISTED SMALL BOWEL RESECTION, MOBILIZATION OF SPLENIC FLEXURE, LEFT COLECTOMY WITH CREATION OF COLOSTOMY (HARTMAN'S PROCEDURE) (N/A)  Patient Location: PACU  Anesthesia Type:General  Level of Consciousness: awake, alert , oriented and patient cooperative  Airway & Oxygen Therapy: Patient Spontanous Breathing and Patient connected to face mask oxygen  Post-op Assessment: Report given to RN, Post -op Vital signs reviewed and stable and Patient moving all extremities  Post vital signs: Reviewed and stable  Last Vitals:  Filed Vitals:   12/03/15 2242 12/04/15 0617  BP: 134/60 124/54  Pulse: 82 87  Temp: 36.8 C 36.4 C  Resp: 18 18    Complications: No apparent anesthesia complications

## 2015-12-05 DIAGNOSIS — K50013 Crohn's disease of small intestine with fistula: Secondary | ICD-10-CM

## 2015-12-05 LAB — CBC WITH DIFFERENTIAL/PLATELET
Basophils Absolute: 0 10*3/uL (ref 0.0–0.1)
Basophils Relative: 1 %
EOS ABS: 0 10*3/uL (ref 0.0–0.7)
EOS PCT: 0 %
HCT: 39.3 % (ref 39.0–52.0)
Hemoglobin: 13.4 g/dL (ref 13.0–17.0)
LYMPHS ABS: 0.4 10*3/uL — AB (ref 0.7–4.0)
LYMPHS PCT: 6 %
MCH: 36.4 pg — AB (ref 26.0–34.0)
MCHC: 34.1 g/dL (ref 30.0–36.0)
MCV: 106.8 fL — AB (ref 78.0–100.0)
MONO ABS: 1.4 10*3/uL — AB (ref 0.1–1.0)
MONOS PCT: 22 %
Neutro Abs: 4.5 10*3/uL (ref 1.7–7.7)
Neutrophils Relative %: 71 %
PLATELETS: 173 10*3/uL (ref 150–400)
RBC: 3.68 MIL/uL — ABNORMAL LOW (ref 4.22–5.81)
RDW: 13.5 % (ref 11.5–15.5)
WBC: 6.2 10*3/uL (ref 4.0–10.5)

## 2015-12-05 LAB — BASIC METABOLIC PANEL
Anion gap: 8 (ref 5–15)
BUN: 13 mg/dL (ref 6–20)
CHLORIDE: 112 mmol/L — AB (ref 101–111)
CO2: 21 mmol/L — ABNORMAL LOW (ref 22–32)
CREATININE: 1.12 mg/dL (ref 0.61–1.24)
Calcium: 7.4 mg/dL — ABNORMAL LOW (ref 8.9–10.3)
Glucose, Bld: 136 mg/dL — ABNORMAL HIGH (ref 65–99)
POTASSIUM: 3.9 mmol/L (ref 3.5–5.1)
SODIUM: 141 mmol/L (ref 135–145)

## 2015-12-05 LAB — GLUCOSE, CAPILLARY
GLUCOSE-CAPILLARY: 132 mg/dL — AB (ref 65–99)
GLUCOSE-CAPILLARY: 135 mg/dL — AB (ref 65–99)
Glucose-Capillary: 105 mg/dL — ABNORMAL HIGH (ref 65–99)
Glucose-Capillary: 109 mg/dL — ABNORMAL HIGH (ref 65–99)
Glucose-Capillary: 136 mg/dL — ABNORMAL HIGH (ref 65–99)
Glucose-Capillary: 98 mg/dL (ref 65–99)
Glucose-Capillary: 99 mg/dL (ref 65–99)

## 2015-12-05 MED ORDER — CETYLPYRIDINIUM CHLORIDE 0.05 % MT LIQD
7.0000 mL | Freq: Two times a day (BID) | OROMUCOSAL | Status: DC
  Administered 2015-12-05 – 2015-12-13 (×16): 7 mL via OROMUCOSAL

## 2015-12-05 NOTE — Progress Notes (Signed)
Patient showered today, was tired afterward, but was glad to feel clean

## 2015-12-05 NOTE — Progress Notes (Signed)
     [facility] Progress Note  Subjective:  POD #1. S/P LAPAROSCOPIC ENTEROLYSIS OF ADHESIONS, HAND- ASSISTED SMALL BOWEL RESECTION, MOBILIZATION OF SPLENIC FLEXURE, LEFT COLECTOMY WITH CREATION OF end left transverse colon COLOSTOMY (HARTMAN'S PROCEDURE) - 12/04/2015  MIld abd pain, no n/v. T 101.   Objective:  Vital signs in last 24 hours: Temp:  [98.1 F (36.7 C)-102 F (38.9 C)] 98.1 F (36.7 C) (02/12 1008) Pulse Rate:  [91-108] 102 (02/12 1008) Resp:  [15-28] 20 (02/12 1008) BP: (135-168)/(47-75) 153/58 mmHg (02/12 1008) SpO2:  [91 %-98 %] 91 % (02/12 1008) Weight:  [209 lb 7 oz (95 kg)-210 lb (95.255 kg)] 209 lb 7 oz (95 kg) (02/12 1008) Last BM Date: 11/30/15 General:   Alert,  Well-developed, in NAD Heart:  Regular rate and rhythm; no murmurs Pulm;lungs clear Abdomen: ostomy LUQ, small amt bloody output  Psych:  Alert and cooperative. Normal mood and affect.  Intake/Output from previous day: 02/11 0701 - 02/12 0700 In: 7367.5 [P.O.:30; I.V.:6550; NG/GT:30; IV Piggyback:757.5] Out: 1165 [Urine:790; Emesis/NG output:250; Blood:125] Intake/Output this shift: Total I/O In: 330 [I.V.:300; Other:30] Out: 69 [Urine:80]  Lab Results:  Recent Labs  12/03/15 0540 12/04/15 0558 12/05/15 0346  WBC 7.1 6.9 6.2  HGB 13.2 13.0 13.4  HCT 40.9 40.2 39.3  PLT 181 170 173   BMET  Recent Labs  12/03/15 0540 12/04/15 0558 12/05/15 0346  NA 138 140 141  K 3.6 3.7 3.9  CL 107 111 112*  CO2 23 22 21*  GLUCOSE 101* 110* 136*  BUN 20 14 13   CREATININE 1.20 0.97 1.12  CALCIUM 7.5* 7.8* 7.4*   Hepatitis Panel  Recent Labs  12/02/15 1222  HCVAB <0.1   Quantiferon TB gold pending   ASSESSMENT/PLAN:  60 yo male with hx Crohns, admitted with high grade obstruction.S/P S/P LAPAROSCOPIC ENTEROLYSIS OF ADHESIONS, HAND- ASSISTED SMALL BOWEL RESECTION, MOBILIZATION OF SPLENIC FLEXURE, LEFT COLECTOMY WITH CREATION OF end left transverse colon COLOSTOMY. T  101. Encouraged to use incentive spirometer.Quantiferon pending. Cont current management per surgery. Will review possible initiation of biologics down the road with attending.  Principal Problem:   SBO (small bowel obstruction) ([facility]) Active Problems:   Crohn's disease ([place])   Personal history of immunosupression therapy   Seizure disorder ([place])   S/P partial colectomy for Crohn's disease   ARF (acute renal failure) ([place])   SIRS (systemic inflammatory response syndrome) ([place])     LOS: 4 days   Hvozdovic, Lori P PA-C 12/05/2015, Pager [phone] Mon-Fri 8a-5p [phone] after 5p, weekends, holidays   Attending physician's note   I have taken an interval history, reviewed the chart and examined the patient. I agree with the Advanced Practitioner's note, impression and recommendations.  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 ?unclear chronicity of the fistula. We will assess regarding starting biologics once he recovers from surgery. Currently asymptomatic.  Quantiferon Tb pending Damaris Hippo, MD [phone] Mon-Fri 8a-5p [phone] after 5p, weekends, holidays

## 2015-12-05 NOTE — Progress Notes (Signed)
[facility] Progress Note   [name]  [id number]:[id number]  DOB: 07-Sep-1956  DOA: 12/01/2015 PCP: No PCP Per Patient  Brief narrative: [name] is a 60 y.o. male male with history of seizures, Crohn's disease, mild mental cognitive defect was brought to the ER after patient had multiple episodes of nausea & vomiting. CT abd and pelvis show high grade obstruction. GI tube placed in ER. Also noted to have ARF and severe lactic acidosis.    Subjective: No complaint of nausea, abdominal pain, cough or dyspnea.   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 -renal function has normalized    Antibiotics: Anti-infectives    Start     Dose/Rate Route Frequency Ordered Stop   12/04/15 2000  vancomycin (VANCOCIN) 1,250 mg in sodium chloride 0.9 % 250 mL IVPB  Status:  Discontinued     1,250 mg 166.7 mL/hr over 90 Minutes Intravenous Every 12 hours 12/04/15 1615 12/05/15 0759   12/02/15 2000  vancomycin (VANCOCIN) IVPB 750 mg/150 ml premix  Status:  Discontinued     750 mg 150 mL/hr over 60 Minutes Intravenous Every 12 hours 12/02/15 0735 12/04/15 1614   12/02/15 1400  piperacillin-tazobactam (ZOSYN) IVPB 3.375 g  Status:  Discontinued     3.375 g 12.5 mL/hr over 240 Minutes Intravenous 3 times per day 12/02/15 0459 12/05/15 0759   12/02/15 0800  vancomycin (VANCOCIN) 2,000 mg in sodium chloride 0.9 % 500 mL IVPB     2,000 mg 250 mL/hr over 120 Minutes Intravenous  Once 12/02/15 0730 12/02/15 1043   12/02/15 0500  piperacillin-tazobactam (ZOSYN) IVPB 3.375 g     3.375 g 100 mL/hr over 30 Minutes Intravenous  Once 12/02/15 0457 12/02/15 0647     Code Status:     Code Status Orders        Start     Ordered   12/02/15 0131  Full code   Continuous     12/02/15 0130    Code Status History    Date Active Date Inactive Code Status Order ID Comments User Context   03/31/2014  5:33 PM 04/02/2014  7:04 PM Full Code [id number]  Janece Canterbury, MD Inpatient    Advance Directive Documentation        Most Recent Value   Type of Advance Directive  Healthcare Power of [place] is POA (in [place])]   Pre-existing out of facility DNR order (yellow form or pink MOST form)     "MOST" Form in Place?       Family Communication: [name]- cousin Disposition Plan: expect 2-3 day hospital stay at minimal DVT prophylaxis: Heparin Consultants: gen surgery, GI Procedures: none    Objective: Filed Weights   12/05/15 0900 12/05/15  1008  Weight: 95.255 kg (210 lb) 95 kg (209 lb 7 oz)    Intake/Output Summary (Last 24 hours) at 12/05/15 1340 Last data filed at 12/05/15 0900  Gross per 24 hour  Intake 4197.5 ml  Output    860 ml  Net 3337.5 ml     Vitals Filed Vitals:   12/05/15 0700 12/05/15 0800 12/05/15 0900 12/05/15 1008  BP: 136/52 143/57 157/61 153/58  Pulse: 93 92 108 102  Temp:  101.1 F (38.4 C)  98.1 F (36.7 C)  TempSrc:  Oral  Oral  Resp: 20 25 28 20   Height:    5' 10"  (1.778 m)  Weight:   95.255 kg (210 lb) 95 kg (209 lb 7 oz)  SpO2: 95% 93% 93% 91%    Exam:  General:  Pt is alert, not in acute distress  HEENT: No icterus, No thrush, oral mucosa moist  Cardiovascular: regular rate and rhythm, S1/S2 No murmur  Respiratory: clear to auscultation bilaterally   Abdomen: Soft,  no Bowel sounds, non tender, NG tube draining brown fluid, non distended, no guarding  MSK: No cyanosis or clubbing- no pedal edema   Data Reviewed: Basic Metabolic Panel:  Recent Labs Lab 12/01/15 1632 12/02/15 0548 12/03/15 0540 12/04/15 0558 12/05/15 0346  NA 139 141 138 140 141  K 4.2 4.9 3.6 3.7 3.9  CL 98* 105 107 111 112*  CO2 25 24 23 22  21*  GLUCOSE 123* 71 101* 110* 136*  BUN 26* 29* 20 14 13   CREATININE 2.28* 1.67* 1.20 0.97 1.12  CALCIUM 8.9 7.7* 7.5* 7.8* 7.4*   Liver Function Tests:  Recent Labs Lab 12/01/15 1632 12/02/15 0548  AST 39 26  ALT 21 12*  ALKPHOS 106 68  BILITOT 1.3* 1.3*  PROT 7.1 5.6*  ALBUMIN 3.9 2.9*    Recent Labs Lab 12/01/15 1632  LIPASE 26   No results for input(s): AMMONIA in the last 168 hours. CBC:  Recent Labs Lab 12/01/15 1632 12/02/15 0548 12/03/15 0540 12/04/15 0558 12/05/15 0346  WBC 8.5 7.0 7.1 6.9 6.2  NEUTROABS 7.7 5.7 6.1  --  4.5  HGB 18.8* 14.9 13.2 13.0 13.4  HCT 56.7* 46.1 40.9 40.2 39.3  MCV 109.5* 109.8* 111.4* 109.8* 106.8*  PLT 308 187 181 170 173   Cardiac Enzymes: No results for input(s): CKTOTAL, CKMB, CKMBINDEX, TROPONINI in the last 168 hours. BNP (last 3 results) No results for input(s): BNP in the last 8760 hours.  ProBNP (last 3 results) No results for input(s): PROBNP in the last 8760 hours.  CBG:  Recent Labs Lab 12/04/15 1530 12/04/15 2350 12/05/15 0458 12/05/15 0803 12/05/15 1157  GLUCAP 129* 132* 135* 136* 98    Recent Results (from the past 240 hour(s))  Culture, Urine     Status: None   Collection Time: 12/02/15  5:30 AM  Result Value Ref Range Status   Specimen Description URINE, RANDOM  Final   Special Requests NONE  Final   Culture   Final    5,000 COLONIES/mL INSIGNIFICANT GROWTH Performed at [facility]    Report Status 12/03/2015 FINAL  Final  Culture, blood (Routine X 2) w Reflex to ID Panel     Status: None (Preliminary result)   Collection  Time: 12/02/15  5:48 AM  Result Value Ref Range Status   Specimen Description BLOOD RIGHT HAND  Final   Special Requests IN PEDIATRIC BOTTLE 1.5CC  Final   Culture   Final  NO GROWTH 3 DAYS Performed at [facility]    Report Status PENDING  Incomplete  Culture, blood (Routine X 2) w Reflex to ID Panel     Status: None (Preliminary result)   Collection Time: 12/02/15  5:48 AM  Result Value Ref Range Status   Specimen Description BLOOD RIGHT FOREARM  Final   Special Requests IN PEDIATRIC BOTTLE [place]  Final   Culture   Final    NO GROWTH 3 DAYS Performed at [facility]    Report Status PENDING  Incomplete     Studies: No results found.  Scheduled Meds:  Scheduled Meds: . antiseptic oral rinse  7 mL Mouth Rinse BID  . dextrose  1 ampule Intravenous Once  . heparin subcutaneous  5,000 Units Subcutaneous 3 times per day  . levETIRAcetam  750 mg Intravenous Q12H  . lip balm  1 application Topical BID  . neomycin-bacitracin-polymyxin   Topical Daily   Continuous Infusions: . dextrose 5 % and 0.9% NaCl 150 mL/hr at 12/04/15 1532    Time spent on care of this patient: 35 min   Pen Mar, MD 12/05/2015, 1:40 PM  LOS: 4 days   [facility] Office  [phone] Pager - Text Page per [email or web address] If 7PM-7AM, please contact night-coverage [email or web address]

## 2015-12-05 NOTE — Progress Notes (Signed)
[place] [facility]:  [phone] General Surgery Progress Note   LOS: 4 days  POD -  1 Day Post-Op  Assessment/Plan: 1.  LAPAROSCOPIC ENTEROLYSIS OF ADHESIONS, HAND- ASSISTED SMALL BOWEL RESECTION, MOBILIZATION OF SPLENIC FLEXURE, LEFT COLECTOMY WITH CREATION OF end left transverse colon COLOSTOMY (HARTMAN'S PROCEDURE) - 12/04/2015 - D. 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  Agree with plan to move him out of step down ICU.  There was no intra-abdominal infection, so the Zosyn and Vanc can probably be stopped in the next 24 hours.  2.  History of Crohn's disease 3.  Seizure disorder 4.  DVT prophylaxis - SQ Heparin   Principal Problem:   SBO (small bowel obstruction) (HCC) Active Problems:   Crohn's disease (HCC)   Personal history of immunosupression therapy   Seizure disorder ([place])   S/P partial colectomy for Crohn's disease   ARF (acute renal failure) (HCC)   SIRS (systemic inflammatory response syndrome) (HCC)  Subjective:  Doing well.  Does not like NGT.  I am not sure he understands all that I have done to him, but well have time to work with him.  Objective:   Filed Vitals:   12/05/15 0600 12/05/15 0700  BP: 135/47 136/52  Pulse: 91 93  Temp:    Resp: 20 20     Intake/Output from previous day:  02/11 0701 - 02/12 0700 In: 7367.5 [P.O.:30; I.V.:6550; NG/GT:30; IV Piggyback:757.5] Out: 1165 [Urine:790; Emesis/NG output:250; Blood:125]  Intake/Output this shift:      Physical Exam:   General: Older WM who is alert and oriented.   Has NGT.   HEENT: Normal. Pupils equal. .   Lungs: Clear   Abdomen: Soft.     Wound: Ostomy LUQ.  Wound packed.   Lab Results:    Recent Labs  12/04/15 0558 12/05/15 0346  WBC 6.9 6.2  HGB 13.0 13.4  HCT 40.2 39.3  PLT 170 173    BMET   Recent Labs  12/04/15 0558 12/05/15 0346  NA 140 141  K 3.7 3.9  CL 111 112*  CO2 22 21*  GLUCOSE  110* 136*  BUN 14 13  CREATININE 0.97 1.12  CALCIUM 7.8* 7.4*    PT/INR  No results for input(s): LABPROT, INR in the last 72 hours.  ABG  No results for input(s): PHART, HCO3 in the last 72 hours.  Invalid input(s): PCO2, PO2   Studies/Results:  Dg Abd 2 Views  12/03/2015  CLINICAL DATA:  Followup small-bowel obstruction EXAM: ABDOMEN - 2 VIEW COMPARISON:  12/02/2015 FINDINGS: The small bowel dilatation is very similar to the prior study with small bowel loops measuring up to 6 cm. Proximal to mid small bowel is distended with decompressed distal small bowel and decompressed colon. There is air seen in to the distal colon. An NG tube projects over the anticipated position of the proximal duodenum. A tiny volume of contrast is seen within the stomach. The dilated loops show air-fluid levels. There is no evidence of pneumoperitoneum. IMPRESSION: Incomplete but relatively high-grade small-bowel obstruction very similar to prior study. Electronically Signed   By: Skipper Cliche M.D.   On: 12/03/2015 08:37     Anti-infectives:   Anti-infectives    Start     Dose/Rate Route Frequency Ordered Stop   12/04/15 2000  vancomycin (VANCOCIN) 1,250 mg in sodium chloride 0.9 % 250 mL IVPB  1,250 mg 166.7 mL/hr over 90 Minutes Intravenous Every 12 hours 12/04/15 1615     12/02/15 2000  vancomycin (VANCOCIN) IVPB 750 mg/150 ml premix  Status:  Discontinued     750 mg 150 mL/hr over 60 Minutes Intravenous Every 12 hours 12/02/15 0735 12/04/15 1614   12/02/15 1400  piperacillin-tazobactam (ZOSYN) IVPB 3.375 g     3.375 g 12.5 mL/hr over 240 Minutes Intravenous 3 times per day 12/02/15 0459     12/02/15 0800  vancomycin (VANCOCIN) 2,000 mg in sodium chloride 0.9 % 500 mL IVPB     2,000 mg 250 mL/hr over 120 Minutes Intravenous  Once 12/02/15 0730 12/02/15 1043   12/02/15 0500  piperacillin-tazobactam (ZOSYN) IVPB 3.375 g     3.375 g 100 mL/hr over 30 Minutes Intravenous  Once 12/02/15 0457  12/02/15 0647      Alphonsa Overall, MD, FACS Pager: [place] [facility]: [phone] 12/05/2015

## 2015-12-05 NOTE — Consult Note (Signed)
WOC ostomy consult note Stoma type/location: LLQ, end colostomy Stomal assessment/size: oval shaped 1 3/4" by 1 5/8', only slightly budded, pink, moist Peristomal assessment: intact Treatment options for stomal/peristomal skin: none Output bloody Ostomy pouching: 1pc.flat pouch  Education provided:  This patient has mild cognitive deficits and history of seizures. He currently resides in an assisted living facility in [facility], which his mother is also a resident but has memory issues.  Today his cousin is at the bedside, she reports his POA which is his sister lives in [place].  I explained the creation of the stoma to the patient/family.  Patient nods and agrees with most of what I say responding mainly "yes" in our conversation.  I demonstrated pouch change to the family member, the patient did not really engage much, did not watch.  He still has his NG, so this limits him somewhat.  I used a 1pc flat today, less steps for Mr. Noon will be better.  His family member reports that it takes many, many repetitions of instructions for him to learn a new skill. Demonstrated lock and roll closure to the patient and family member multiple times.  Allowed patient to practice lock and roll, however he did require redirection and with me guiding his hands he was able to close, but required a lot of guidance.  I had to remind him several times to look down at the pouch and closure device.  I have asked the family member to find out what type of assistance Nanine Means will provide for the patient, so that we will better understand what our teaching goals should be for the patient.  Also the family member reports they are considering rehab placement for the patient short term which will allow him to continue to learn care of his ostomy.  Enrolled patient in [place] program: No   WOC team will continue to follow along with you for support with ostomy care.  Klover Priestly [place]  RN,CWOCN [phone]

## 2015-12-05 NOTE — Op Note (Signed)
NAMEYASMIN, DIBELLO NO.:  [id number]  MEDICAL RECORD NO.:  [id number]  LOCATION:  65                         FACILITY:  [facility]  PHYSICIAN:  Fenton Malling. Lucia Gaskins, M.D.  DATE OF BIRTH:  1956/02/07  DATE OF PROCEDURE:  12/04/2015                               OPERATIVE REPORT   PREOPERATIVE DIAGNOSES:  Small bowel obstruction, history of Crohn disease and prior colon resection.  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  SURGEON:  Fenton Malling. Lucia Gaskins, M.D.  ASSISTANT:  Adin Hector, MD.  ANESTHESIA:  General endotracheal supervised by Dr. Lorrene Reid.  ESTIMATED BLOOD LOSS:  200 mL.  DRAINS:  Left in were none.  SPECIMEN:  Segment of small bowel and segment of left colon with Jonathan suture marking Jonathan distal end of Jonathan left colon with 2 strands of a single suture at Jonathan fistula site and Jonathan remaining proximal small bowel and Jonathan colon.  INDICATION OF PROCEDURE:  Mr. Dupuy is a 60 year old white male who has no primary care physician, but had seen Dr. Zenovia Jarred in Jonathan past for Crohn disease.    He was admitted for a bowel obstruction on December 01, 2015.  He had a prior history of a right ileocecectomy for Crohn disease, he think in Jonathan 37s. It is unclear to me if he had left colon surgery. He had a colonoscopy by [name] on November 06, 2014, in which [name] was able to get to 45 cm from Jonathan anal verge where he encountered a colonic benign stricture.  It was not able to advance Jonathan scope further. Mr. Jenny subsequently underwent CT enterography and no lesion was seen in Jonathan area (in retrospect, I think that I can see Jonathan area of Jonathan  fistula).  At Jonathan time it appears unclear whether Jonathan stricture was Crohn's or prior surgery (again, it is unclear to me whether he ever had left colon surgery)  Jonathan patient has now been in Jonathan hospital for 3 days without improvement of Jonathan small bowel obstruction, now comes for exploration and attempt resolve this bowel obstruction.  DESCRIPTION OF PROCEDURE:  Jonathan patient was taken to room #1 at [facility], underwent a general endotracheal anesthetic.  He already had an NG tube and Foley in place.  He was already on Zosyn Jonathan antibiotic.  A time-out was held and surgical checklist run.  I accessed Jonathan abdominal cavity with a 5 mm trocar in Jonathan left upper quadrant.  I placed 4 additional 5 mm trocars: 1 in Jonathan left lower quadrant. 1 in Jonathan right lower quadrant, 1 in Jonathan right upper quadrant, and 1 in Jonathan left lateral abdomen.  I spent Jonathan first of 30 minutes doing enterolysis of adhesions, taking down Jonathan omentum, which was stuck to his anterior abdominal wall and trying to free up Jonathan small bowel from its adhesion to Jonathan left lateral abdominal wall.  Jonathan bowel was  free of adhesions with Jonathan proximal one half of his small bowel being dilated and Jonathan distal one-half of Jonathan small bowel being decompressed.  Jonathan point of obstruction was where Jonathan small bowel was wrapped and scarred to Jonathan medial wall of Jonathan left colon.  I then spent about 45 minutes trying to get this small bowel down from Jonathan medial colon adhesion.  But what I found was a fistula between Jonathan mid small bowel (and Jonathan point of obstruction) and Jonathan left colon.  I think Jonathan stricture that [name] saw in his colonoscopy approximately 1 year ago.  I then converted into a hand assist operation where I made a midline incision and used Jonathan Gel-port for Jonathan hand assist.  I was able to eviscerate Jonathan small bowel where Jonathan fistula was.  I used a Ethicon GIA 75 mm stapler to divide Jonathan proximal small bowel and an Ethicon GIA 75 stapler to  divide Jonathan distal small bowel around Jonathan fistula.  I resected about 15 cm of small bowel.  I used a Harmonic to divide Jonathan mesentery, oversewed Jonathan mesentery where it was bleeding with 3-0 silk sutures.  I did a side-to-side anastomosis with a Ethicon GIA 75 stapler and closed Jonathan enterotomy with interrupted 3-0 silk sutures and then closed Jonathan mesentery with a running 2-0 Vicryl suture.  This left a 4-5 cm anastomosis between Jonathan two loops of small bowel.  I returned Jonathan small bowel to Jonathan abdominal cavity.  I then laparoscopically started looking at Jonathan left colon.  I was able to identify where he had a very scarred area around mid to distal left colon, I mobilized this up using a hand assist.  I went to Jonathan sigmoid colon proximally.  I divided Jonathan proximal sigmoid colon with a 75 GIA stapler.  Mesentery was taken down with hand assist.  There was a question of anastomosis in Jonathan mid left colon, but, I think this was stricturing from old Crohn disease where he had fistulas.  I then mobilized Jonathan splenic flexure.  I did not identify Jonathan ureter, but tried to hug Jonathan colon Jonathan close Jonathan I could since this process did not look malignant and was densely scarred  I was unable to after mobilizing splenic flexure get Jonathan whole specimen up into Jonathan midline wound, took Jonathan mesentery down with a Harmonic scalpel, used an Endoloop on a left colonic artery and then divided Jonathan colon proximal to Jonathan strictured area.  I then mobilized Jonathan splenic flexure, I put a long suture on Jonathan distal end of this colon with 2 strands, I put a single strand suture at Jonathan fistula site.  I then mobilized up Jonathan left colon enough to bring up a colostomy.  I irrigated Jonathan abdomen with 3 L of saline.  I developed an ostomy in Jonathan left upper quadrant where I brought Jonathan colon through this and tacked Jonathan colon to Jonathan anterior abdominal wall with 2-0 Vicryl sutures.    I then re-laparoscope Jonathan scope Jonathan patient.  There was no active bleeding  along Jonathan left colonic gutter.  Jonathan small-bowel anastomosis looked good with Jonathan omentum, I had tried to pull under Jonathan abdominal wall under Jonathan open wound.  I then closed Jonathan midline wound  with 2 running #1 Novafil sutures and with interrupted #1 Novafil sutures. Jonathan trocars were all removed in turn.  I closed Jonathan port sites with 4-0 Monocryl and painted with LiquiBand.  I packed Jonathan  midline wound with saline gauze.  I matured Jonathan colostomy with interrupted 3-0 Vicryl sutures and placed a colostomy bag on this. Because of Jonathan length of surgery, which was approximately 4-1/2 to 5 hours, I will put Jonathan patient in step-down unit, but he remained stable during Jonathan operation.  Sponge and needle count were correct at Jonathan end of Jonathan case.  Jonathan patient was transferred to Jonathan recovery room in good condition.  There are photos in Jonathan chart of Jonathan fistula and a drawing kind of outlining Jonathan findings.   Fenton Malling. Lucia Gaskins, M.D., [facility], scribe for Epic   DHN/MEDQ  D:  12/04/2015  T:  12/04/2015  Job:  [id number]  cc:   Zenovia Jarred, MD Fax: [phone]  Debbe Odea, M.D.  Dr. Ellouise Newer

## 2015-12-05 NOTE — Progress Notes (Signed)
Attempted to call report to the floor twice in the past hour.  RN on 3W unable to take report currently.  RN on 3W to call to get report when available.  Joanne Brander Roselie Awkward RN

## 2015-12-06 ENCOUNTER — Encounter (HOSPITAL_COMMUNITY): Payer: Self-pay | Admitting: Surgery

## 2015-12-06 DIAGNOSIS — K50813 Crohn's disease of both small and large intestine with fistula: Secondary | ICD-10-CM

## 2015-12-06 LAB — BASIC METABOLIC PANEL
ANION GAP: 8 (ref 5–15)
BUN: 12 mg/dL (ref 6–20)
CALCIUM: 7.5 mg/dL — AB (ref 8.9–10.3)
CO2: 24 mmol/L (ref 22–32)
CREATININE: 1.02 mg/dL (ref 0.61–1.24)
Chloride: 112 mmol/L — ABNORMAL HIGH (ref 101–111)
GLUCOSE: 112 mg/dL — AB (ref 65–99)
Potassium: 3.3 mmol/L — ABNORMAL LOW (ref 3.5–5.1)
Sodium: 144 mmol/L (ref 135–145)

## 2015-12-06 LAB — GLUCOSE, CAPILLARY
GLUCOSE-CAPILLARY: 112 mg/dL — AB (ref 65–99)
GLUCOSE-CAPILLARY: 125 mg/dL — AB (ref 65–99)
GLUCOSE-CAPILLARY: 88 mg/dL (ref 65–99)
Glucose-Capillary: 105 mg/dL — ABNORMAL HIGH (ref 65–99)
Glucose-Capillary: 87 mg/dL (ref 65–99)
Glucose-Capillary: 100 mg/dL — ABNORMAL HIGH (ref 65–99)

## 2015-12-06 LAB — PROCALCITONIN: PROCALCITONIN: 1.18 ng/mL

## 2015-12-06 MED ORDER — POTASSIUM CHLORIDE 10 MEQ/100ML IV SOLN
10.0000 meq | INTRAVENOUS | Status: AC
  Administered 2015-12-06 (×4): 10 meq via INTRAVENOUS
  Filled 2015-12-06 (×4): qty 100

## 2015-12-06 NOTE — Progress Notes (Signed)
[facility] Progress Note   [name]  XMI:[id number]  DOB: April 15, 1956  DOA: 12/01/2015 PCP: No PCP Per Patient  Brief narrative: [name] is a 60 y.o. male male with history of seizures, Crohn's disease, mild mental cognitive defect was brought to the ER after patient had multiple episodes of nausea & vomiting. CT abd and pelvis show high grade obstruction. GI tube placed in ER. Also noted to have ARF and severe lactic acidosis.    Subjective: No complaint of nausea, cough or dyspnea. Abdominal pan is being controlled with narcotics.  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 -renal function has normalized    Antibiotics: Anti-infectives    Start     Dose/Rate Route Frequency Ordered Stop   12/04/15 2000  vancomycin (VANCOCIN) 1,250 mg in sodium chloride 0.9 % 250 mL IVPB  Status:  Discontinued     1,250 mg 166.7 mL/hr over 90 Minutes Intravenous Every 12 hours 12/04/15 1615 12/05/15 0759   12/02/15 2000  vancomycin (VANCOCIN) IVPB 750 mg/150 ml premix  Status:  Discontinued     750 mg 150 mL/hr over 60 Minutes Intravenous Every 12 hours 12/02/15 0735 12/04/15 1614   12/02/15 1400  piperacillin-tazobactam (ZOSYN) IVPB 3.375 g  Status:  Discontinued     3.375 g 12.5 mL/hr over 240 Minutes  Intravenous 3 times per day 12/02/15 0459 12/05/15 0759   12/02/15 0800  vancomycin (VANCOCIN) 2,000 mg in sodium chloride 0.9 % 500 mL IVPB     2,000 mg 250 mL/hr over 120 Minutes Intravenous  Once 12/02/15 0730 12/02/15 1043   12/02/15 0500  piperacillin-tazobactam (ZOSYN) IVPB 3.375 g     3.375 g 100 mL/hr over 30 Minutes Intravenous  Once 12/02/15 0457 12/02/15 0647     Code Status:     Code Status Orders        Start     Ordered   12/02/15 0131  Full code   Continuous     12/02/15 0130    Code Status History    Date Active Date Inactive Code Status Order ID Comments User Context   03/31/2014  5:33 PM 04/02/2014  7:04 PM Full Code [id number]  Janece Canterbury, MD Inpatient    Advance Directive Documentation        Most Recent Value   Type of Advance Directive  Healthcare Power of Attorney [sister [name] is POA (in [place])]   Pre-existing out of facility DNR order (yellow form or pink MOST form)     "MOST" Form in Place?       Family Communication: [name]- cousin Disposition Plan: expect 2-3 day hospital stay at minimal DVT prophylaxis: Heparin Consultants: gen surgery, GI Procedures: none    Objective: Filed  Weights   12/05/15 0900 12/05/15 1008  Weight: 95.255 kg (210 lb) 95 kg (209 lb 7 oz)    Intake/Output Summary (Last 24 hours) at 12/06/15 0903 Last data filed at 12/06/15 0617  Gross per 24 hour  Intake      0 ml  Output   1450 ml  Net  -1450 ml     Vitals Filed Vitals:   12/05/15 1008 12/05/15 1406 12/05/15 1950 12/06/15 0427  BP: 153/58 139/55 140/51 151/56  Pulse: 102 97 106 98  Temp: 98.1 F (36.7 C) 98.6 F (37 C) 99.6 F (37.6 C) 98.4 F (36.9 C)  TempSrc: Oral Oral Oral Oral  Resp: 20 18 16 18   Height: 5' 10"  (1.778 m)     Weight: 95 kg (209 lb 7 oz)     SpO2: 91% 91% 92% 91%    Exam:  General:  Pt is alert, not in acute distress  HEENT: No icterus, No thrush, oral mucosa moist  Cardiovascular: regular rate and rhythm, S1/S2  No murmur  Respiratory: clear to auscultation bilaterally   Abdomen: Soft, + Bowel sounds, non tender, NG tube draining brown fluid, non distended, no guarding  MSK: No cyanosis or clubbing- no pedal edema   Data Reviewed: Basic Metabolic Panel:  Recent Labs Lab 12/02/15 0548 12/03/15 0540 12/04/15 0558 12/05/15 0346 12/06/15 0434  NA 141 138 140 141 144  K 4.9 3.6 3.7 3.9 3.3*  CL 105 107 111 112* 112*  CO2 24 23 22  21* 24  GLUCOSE 71 101* 110* 136* 112*  BUN 29* 20 14 13 12   CREATININE 1.67* 1.20 0.97 1.12 1.02  CALCIUM 7.7* 7.5* 7.8* 7.4* 7.5*   Liver Function Tests:  Recent Labs Lab 12/01/15 1632 12/02/15 0548  AST 39 26  ALT 21 12*  ALKPHOS 106 68  BILITOT 1.3* 1.3*  PROT 7.1 5.6*  ALBUMIN 3.9 2.9*    Recent Labs Lab 12/01/15 1632  LIPASE 26   No results for input(s): AMMONIA in the last 168 hours. CBC:  Recent Labs Lab 12/01/15 1632 12/02/15 0548 12/03/15 0540 12/04/15 0558 12/05/15 0346  WBC 8.5 7.0 7.1 6.9 6.2  NEUTROABS 7.7 5.7 6.1  --  4.5  HGB 18.8* 14.9 13.2 13.0 13.4  HCT 56.7* 46.1 40.9 40.2 39.3  MCV 109.5* 109.8* 111.4* 109.8* 106.8*  PLT 308 187 181 170 173   Cardiac Enzymes: No results for input(s): CKTOTAL, CKMB, CKMBINDEX, TROPONINI in the last 168 hours. BNP (last 3 results) No results for input(s): BNP in the last 8760 hours.  ProBNP (last 3 results) No results for input(s): PROBNP in the last 8760 hours.  CBG:  Recent Labs Lab 12/05/15 1649 12/05/15 1953 12/05/15 2344 12/06/15 0413 12/06/15 0752  GLUCAP 109* 105* 99 105* 88    Recent Results (from the past 240 hour(s))  Culture, Urine     Status: None   Collection Time: 12/02/15  5:30 AM  Result Value Ref Range Status   Specimen Description URINE, RANDOM  Final   Special Requests NONE  Final   Culture   Final    5,000 COLONIES/mL INSIGNIFICANT GROWTH Performed at [facility]    Report Status 12/03/2015 FINAL  Final  Culture, blood (Routine X  2) w Reflex to ID Panel     Status: None (Preliminary result)   Collection Time: 12/02/15  5:48 AM  Result Value Ref Range Status   Specimen Description BLOOD RIGHT HAND  Final   Special Requests IN  PEDIATRIC BOTTLE 1.5CC  Final   Culture   Final    NO GROWTH 3 DAYS Performed at [facility]    Report Status PENDING  Incomplete  Culture, blood (Routine X 2) w Reflex to ID Panel     Status: None (Preliminary result)   Collection Time: 12/02/15  5:48 AM  Result Value Ref Range Status   Specimen Description BLOOD RIGHT FOREARM  Final   Special Requests IN PEDIATRIC BOTTLE [place]  Final   Culture   Final    NO GROWTH 3 DAYS Performed at [facility]    Report Status PENDING  Incomplete     Studies: No results found.  Scheduled Meds:  Scheduled Meds: . antiseptic oral rinse  7 mL Mouth Rinse BID  . dextrose  1 ampule Intravenous Once  . heparin subcutaneous  5,000 Units Subcutaneous 3 times per day  . levETIRAcetam  750 mg Intravenous Q12H  . lip balm  1 application Topical BID  . neomycin-bacitracin-polymyxin   Topical Daily  . potassium chloride  10 mEq Intravenous Q1 Hr x 4   Continuous Infusions: . dextrose 5 % and 0.9% NaCl 150 mL/hr at 12/04/15 1532    Time spent on care of this patient: 35 min   [place], MD 12/06/2015, 9:03 AM  LOS: 5 days   [facility] Office  [phone] Pager - Text Page per [email or web address] If 7PM-7AM, please contact night-coverage [email or web address]

## 2015-12-06 NOTE — Care Management Important Message (Signed)
Important Message  Patient Details  Name: [name] MRN: [id number] Date of Birth: 1956-09-19   Medicare Important Message Given:  Yes    [name] 12/06/2015, 2:36 [place] Message  Patient Details  Name: [name] MRN: [id number] Date of Birth: [birth year]   Medicare Important Message Given:  Yes    [name] 12/06/2015, 2:36 PM

## 2015-12-06 NOTE — Progress Notes (Signed)
2 Days Post-Op  Subjective: He is up in the chair.  NG cannister is full, No BS. Ostomy without any thing but some bloody drainage.  Mostly serous, very little in bag.  He is still having pain, but I doubt he complains much at all.    Objective: Vital signs in last 24 hours: Temp:  [98.1 F (36.7 C)-99.6 F (37.6 C)] 98.4 F (36.9 C) (02/13 0427) Pulse Rate:  [97-106] 98 (02/13 0427) Resp:  [16-20] 18 (02/13 0427) BP: (139-153)/(51-58) 151/56 mmHg (02/13 0427) SpO2:  [91 %-92 %] 91 % (02/13 0427) Weight:  [95 kg (209 lb 7 oz)] 95 kg (209 lb 7 oz) (02/12 1008) Last BM Date:  (prior to surgery) 300 IV recorded Urine 830 NG 700 Afebrile yesterday, VSS Labs OK K+ 3.3 IV rate at 150 per hour.   Intake/Output from previous day: 02/12 0701 - 02/13 0700 In: 330 [I.V.:300] Out: 1530 [Urine:830; Emesis/NG output:700] Intake/Output this shift:    General appearance: alert, cooperative and no distress Resp: clear to auscultation bilaterally GI: up in chair, some pain, sites and open wound look good.  No BS, nothing but some serous stuff in the ostomy bag.    Lab Results:   Recent Labs  12/04/15 0558 12/05/15 0346  WBC 6.9 6.2  HGB 13.0 13.4  HCT 40.2 39.3  PLT 170 173    BMET  Recent Labs  12/05/15 0346 12/06/15 0434  NA 141 144  K 3.9 3.3*  CL 112* 112*  CO2 21* 24  GLUCOSE 136* 112*  BUN 13 12  CREATININE 1.12 1.02  CALCIUM 7.4* 7.5*   PT/INR No results for input(s): LABPROT, INR in the last 72 hours.   Recent Labs Lab 12/01/15 1632 12/02/15 0548  AST 39 26  ALT 21 12*  ALKPHOS 106 68  BILITOT 1.3* 1.3*  PROT 7.1 5.6*  ALBUMIN 3.9 2.9*     Lipase     Component Value Date/Time   LIPASE 26 12/01/2015 1632     Studies/Results: No results found.  Medications: . antiseptic oral rinse  7 mL Mouth Rinse BID  . dextrose  1 ampule Intravenous Once  . heparin subcutaneous  5,000 Units Subcutaneous 3 times per day  . levETIRAcetam  750 mg  Intravenous Q12H  . lip balm  1 application Topical BID  . neomycin-bacitracin-polymyxin   Topical Daily  . potassium chloride  10 mEq Intravenous Q1 Hr x 4   . 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 Lucia Gaskins Acute renal failure with decreased urine output Tachycardia DVT: SCD/heparin Antibiotics:   He had 4 days of antibiotics and is now off antibiotics.     Plan:  K+ being replaced, wet to dry dressing, mobilize and have him work on IS.  Await bowel function return.  Restart Crohn's rx after bowel function is back.       LOS: 5 days    [name] 12/06/2015

## 2015-12-06 NOTE — Progress Notes (Signed)
     [facility] Progress Note  Subjective:  POD #2,  S/P LAPAROSCOPIC ENTEROLYSIS OF ADHESIONS, HAND- ASSISTED SMALL BOWEL RESECTION, MOBILIZATION OF SPLENIC FLEXURE, LEFT COLECTOMY WITH CREATION OF end left transverse colon COLOSTOMY (HARTMAN'S PROCEDURE) - 12/04/2015  Less pain today.NG output 700 yesterday.Low grade temp.   Objective:  Vital signs in last 24 hours: Temp:  [98.1 F (36.7 C)-99.6 F (37.6 C)] 98.4 F (36.9 C) (02/13 0427) Pulse Rate:  [97-106] 98 (02/13 0427) Resp:  [16-20] 18 (02/13 0427) BP: (139-153)/(51-58) 151/56 mmHg (02/13 0427) SpO2:  [91 %-92 %] 91 % (02/13 0427) Weight:  [209 lb 7 oz (95 kg)] 209 lb 7 oz (95 kg) (02/12 1008) Last BM Date:  (prior to surgery) General: Alert, Well-developed, in NAD Heart: Regular rate and rhythm; no murmurs Pulm;lungs clear Abdomen: ostomy LUQ, small amt bloody output  Psych: Alert and cooperative. Normal mood and affect.  Intake/Output from previous day: 02/12 0701 - 02/13 0700 In: 330 [I.V.:300] Out: 1530 [Urine:830; Emesis/NG output:700] Intake/Output this shift:    Lab Results:  Recent Labs  12/04/15 0558 12/05/15 0346  WBC 6.9 6.2  HGB 13.0 13.4  HCT 40.2 39.3  PLT 170 173   BMET  Recent Labs  12/04/15 0558 12/05/15 0346 12/06/15 0434  NA 140 141 144  K 3.7 3.9 3.3*  CL 111 112* 112*  CO2 22 21* 24  GLUCOSE 110* 136* 112*  BUN 14 13 12   CREATININE 0.97 1.12 1.02  CALCIUM 7.8* 7.4* 7.5*     ASSESSMENT/PLAN:  60 yo male with hx Crohns, admitted with high grade obstruction.S/P S/P LAPAROSCOPIC ENTEROLYSIS OF ADHESIONS, HAND- ASSISTED SMALL BOWEL RESECTION, MOBILIZATION OF SPLENIC FLEXURE, LEFT COLECTOMY WITH CREATION OF end left transverse colon COLOSTOMY. Noted to have fistula between small bowel and colon--chronicity unclear.Quantiferon TB gold pending. Continue per surgical service. Once he recovers from surgery, will consider intitiation of biologics.  Principal  Problem:   SBO (small bowel obstruction) (HCC) Active Problems:   Crohn's disease ([place])   Personal history of immunosupression therapy   Seizure disorder ([place])   S/P partial colectomy for Crohn's disease   ARF (acute renal failure) ([place])   SIRS (systemic inflammatory response syndrome) ([place])     LOS: 5 days   Priti Consoli P PA-C 12/06/2015, Pager [phone] Mon-Fri 8a-5p [phone] after 5p, weekends, holidays

## 2015-12-07 ENCOUNTER — Inpatient Hospital Stay (HOSPITAL_COMMUNITY): Payer: Medicare Other

## 2015-12-07 DIAGNOSIS — J189 Pneumonia, unspecified organism: Secondary | ICD-10-CM

## 2015-12-07 LAB — CBC
HCT: 36.8 % — ABNORMAL LOW (ref 39.0–52.0)
HEMOGLOBIN: 11.9 g/dL — AB (ref 13.0–17.0)
MCH: 35.1 pg — ABNORMAL HIGH (ref 26.0–34.0)
MCHC: 32.3 g/dL (ref 30.0–36.0)
MCV: 108.6 fL — ABNORMAL HIGH (ref 78.0–100.0)
PLATELETS: 252 10*3/uL (ref 150–400)
RBC: 3.39 MIL/uL — AB (ref 4.22–5.81)
RDW: 13.8 % (ref 11.5–15.5)
WBC: 7.9 10*3/uL (ref 4.0–10.5)

## 2015-12-07 LAB — BASIC METABOLIC PANEL
ANION GAP: 11 (ref 5–15)
BUN: 13 mg/dL (ref 6–20)
CALCIUM: 7.6 mg/dL — AB (ref 8.9–10.3)
CHLORIDE: 116 mmol/L — AB (ref 101–111)
CO2: 21 mmol/L — AB (ref 22–32)
CREATININE: 1.2 mg/dL (ref 0.61–1.24)
GFR calc non Af Amer: >60 mL/min (ref >60)
Glucose, Bld: 152 mg/dL — ABNORMAL HIGH (ref 65–99)
Potassium: 3.3 mmol/L — ABNORMAL LOW (ref 3.5–5.1)
SODIUM: 148 mmol/L — AB (ref 135–145)

## 2015-12-07 LAB — URINALYSIS, ROUTINE W REFLEX MICROSCOPIC
Glucose, UA: NEGATIVE mg/dL
KETONES UR: NEGATIVE mg/dL
NITRITE: POSITIVE — AB
PROTEIN: 100 mg/dL — AB
Specific Gravity, Urine: 1.022 (ref 1.005–1.030)
pH: 5.5 (ref 5.0–8.0)

## 2015-12-07 LAB — URINE MICROSCOPIC-ADD ON

## 2015-12-07 LAB — GLUCOSE, CAPILLARY
GLUCOSE-CAPILLARY: 135 mg/dL — AB (ref 65–99)
GLUCOSE-CAPILLARY: 90 mg/dL (ref 65–99)
GLUCOSE-CAPILLARY: 117 mg/dL — AB (ref 65–99)
GLUCOSE-CAPILLARY: 107 mg/dL — AB (ref 65–99)
Glucose-Capillary: 91 mg/dL (ref 65–99)

## 2015-12-07 LAB — PROCALCITONIN: PROCALCITONIN: 0.82 ng/mL

## 2015-12-07 LAB — CULTURE, BLOOD (ROUTINE X 2)
CULTURE: NO GROWTH
CULTURE: NO GROWTH

## 2015-12-07 LAB — LACTIC ACID, PLASMA
LACTIC ACID, VENOUS: 1 mmol/L (ref 0.5–2.0)
Lactic Acid, Venous: 0.9 mmol/L (ref 0.5–2.0)

## 2015-12-07 LAB — MRSA PCR SCREENING: MRSA by PCR: NEGATIVE

## 2015-12-07 MED ORDER — PIPERACILLIN-TAZOBACTAM 3.375 G IVPB
3.3750 g | Freq: Three times a day (TID) | INTRAVENOUS | Status: DC
  Administered 2015-12-07 – 2015-12-11 (×12): 3.375 g via INTRAVENOUS
  Filled 2015-12-07 (×10): qty 50

## 2015-12-07 MED ORDER — POTASSIUM CHLORIDE 10 MEQ/100ML IV SOLN
10.0000 meq | INTRAVENOUS | Status: AC
  Administered 2015-12-07 (×4): 10 meq via INTRAVENOUS
  Filled 2015-12-07 (×4): qty 100

## 2015-12-07 MED ORDER — SODIUM CHLORIDE 0.9 % IV BOLUS (SEPSIS)
500.0000 mL | Freq: Once | INTRAVENOUS | Status: AC
  Administered 2015-12-07: 500 mL via INTRAVENOUS

## 2015-12-07 MED ORDER — DEXTROSE 5 % IV SOLN
INTRAVENOUS | Status: AC
  Administered 2015-12-07 – 2015-12-08 (×3): via INTRAVENOUS

## 2015-12-07 MED ORDER — KCL IN DEXTROSE-NACL 20-5-0.9 MEQ/L-%-% IV SOLN
INTRAVENOUS | Status: DC
  Administered 2015-12-07: 13:00:00 via INTRAVENOUS
  Filled 2015-12-07: qty 1000

## 2015-12-07 NOTE — Progress Notes (Addendum)
[facility] Progress Note   [name]  ZOX:[id number]  DOB: [birth year]  DOA: 12/01/2015 PCP: No PCP Per Patient  Brief narrative: [name] is a 60 y.o. male male with history of seizures, Crohn's disease, mild mental cognitive defect was brought to the ER after patient had multiple episodes of nausea & vomiting. CT abd and pelvis show high grade obstruction. NG tube placed in ER. Also noted to have ARF and severe lactic acidosis.  As obstruction did not resolve, he underwent Ex lap with bowel resection and colostomy. He appeared to be recovering well until last night when he pulled out his NG tube and became slightly confused. He was noted to have low grade fevers through the night.    Subjective: No complaint of nausea, cough or dyspnea. Abdominal pan is being controlled with narcotics.  Assessment/Plan: Principal Problem:   SIRS (systemic inflammatory response syndrome)- possible pneumonia?? - initially due to SBO- resolved after surgery but becoming febrile and confused last night   - continue frequent vital signs to monitor for fever recurrence - CXR shows patchy bibasilar infiltrates which are not new- he did not have a cough or symptoms of infection until today- will start Zosyn for possible HCAP and/or aspiration - possibly aspirated either prior to admission when he was vomiting or last night when NG tube came out - if MRSA PCR positive, can cover for MRSA pneumonia but my clinical suspicion for this is low -  UA shows bacteria Nitrite + and only 0-5 WBC- will send for culture - Lactic acid and procalcitonin normal - have asked for condom cath and strict I and O to ensure he has adequate urine output and is not becoming septic  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- S/p resection and colostomy - surgery assisting with managment - NPO/ NG tube -   -management per surgery  Dehydration  - on admission - improved with IV hydration  - cont IVF while  NPO- cont to follow I and O carefully   Hypernatremia - D5NS being changed to D5W today- follow Na+ in AM   Hypokalemia - replace via IV as he is still NPO    Crohn's disease / small bowel fistula - on sulfasalazine at home- will resume once able to take orals- GI following  Mild hypoglycemia - on admission - resolved    Seizure disorder - cont Keppra via IV until able to take orals    ARF (acute renal failure) ([place]) - likely due to dehydration? -renal function has normalized    Antibiotics: Anti-infectives    Start     Dose/Rate Route Frequency Ordered Stop   12/04/15 2000  vancomycin (VANCOCIN) 1,250 mg in sodium chloride 0.9 % 250 mL IVPB  Status:  Discontinued     1,250 mg 166.7 mL/hr over 90 Minutes Intravenous Every 12 hours 12/04/15 1615 12/05/15 0759   12/02/15 2000  vancomycin (VANCOCIN) IVPB 750 mg/150 ml premix  Status:  Discontinued     750 mg 150 mL/hr over 60 Minutes Intravenous Every 12 hours 12/02/15 0735 12/04/15 1614   12/02/15 1400  piperacillin-tazobactam (ZOSYN) IVPB 3.375 g  Status:  Discontinued     3.375 g 12.5 mL/hr over 240 Minutes Intravenous 3 times per day 12/02/15 0459 12/05/15 0759   12/02/15 0800  vancomycin (VANCOCIN) 2,000 mg in sodium chloride 0.9 % 500 mL IVPB     2,000 mg 250 mL/hr over 120 Minutes Intravenous  Once 12/02/15 0730 12/02/15  1043   12/02/15 0500  piperacillin-tazobactam (ZOSYN) IVPB 3.375 g     3.375 g 100 mL/hr over 30 Minutes Intravenous  Once 12/02/15 0457 12/02/15 0647     Code Status:     Code Status Orders        Start     Ordered   12/02/15 0131  Full code   Continuous     12/02/15 0130    Code Status History    Date Active Date Inactive Code Status Order ID Comments User Context   03/31/2014  5:33 PM 04/02/2014  7:04 PM Full Code [id number]  Janece Canterbury, MD Inpatient    Advance Directive Documentation        Most Recent Value   Type of Advance Directive  Healthcare Power of Attorney [sister [name] is  POA (in [place])]   Pre-existing out of facility DNR order (yellow form or pink MOST form)     "MOST" Form in Place?       Family Communication: [name]- cousin Disposition Plan: expect 2-3 day hospital stay at minimal DVT prophylaxis: Heparin Consultants: gen surgery, GI Procedures: none    Objective: Filed Weights   12/05/15 0900 12/05/15 1008  Weight: 95.255 kg (210 lb) 95 kg (209 lb 7 oz)    Intake/Output Summary (Last 24 hours) at 12/07/15 1434 Last data filed at 12/07/15 1226  Gross per 24 hour  Intake   1950 ml  Output   2200 ml  Net   -250 ml     Vitals Filed Vitals:   12/07/15 0604 12/07/15 0806 12/07/15 0934 12/07/15 1148  BP: 136/59 149/70 148/71 149/62  Pulse: 86 100 98 101  Temp: 98.6 F (37 C) 99.4 F (37.4 C) 98.7 F (37.1 C) 99.7 F (37.6 C)  TempSrc: Axillary  Oral Oral  Resp: 28 24 28 26   Height:      Weight:      SpO2: 93% 94% 90% 94%    Exam:  General:  Pt is alert, not in acute distress  HEENT: No icterus, No thrush, oral mucosa moist  Cardiovascular: regular rate and rhythm, S1/S2 No murmur  Respiratory: clear to auscultation bilaterally - has upper airway congestion  Abdomen: Soft, + Bowel sounds, non tender, NG tube draining brown fluid, non distended, no guarding  MSK: No cyanosis or clubbing- no pedal edema   Data Reviewed: Basic Metabolic Panel:  Recent Labs Lab 12/03/15 0540 12/04/15 0558 12/05/15 0346 12/06/15 0434 12/07/15 0820  NA 138 140 141 144 148*  K 3.6 3.7 3.9 3.3* 3.3*  CL 107 111 112* 112* 116*  CO2 23 22 21* 24 21*  GLUCOSE 101* 110* 136* 112* 152*  BUN 20 14 13 12 13   CREATININE 1.20 0.97 1.12 1.02 1.20  CALCIUM 7.5* 7.8* 7.4* 7.5* 7.6*   Liver Function Tests:  Recent Labs Lab 12/01/15 1632 12/02/15 0548  AST 39 26  ALT 21 12*  ALKPHOS 106 68  BILITOT 1.3* 1.3*  PROT 7.1 5.6*  ALBUMIN 3.9 2.9*    Recent Labs Lab 12/01/15 1632  LIPASE 26   No results for input(s): AMMONIA in  the last 168 hours. CBC:  Recent Labs Lab 12/01/15 1632 12/02/15 0548 12/03/15 0540 12/04/15 0558 12/05/15 0346 12/07/15 0820  WBC 8.5 7.0 7.1 6.9 6.2 7.9  NEUTROABS 7.7 5.7 6.1  --  4.5  --   HGB 18.8* 14.9 13.2 13.0 13.4 11.9*  HCT 56.7* 46.1 40.9 40.2 39.3 36.8*  MCV 109.5* 109.8* 111.4*  109.8* 106.8* 108.6*  PLT 308 187 181 170 173 252   Cardiac Enzymes: No results for input(s): CKTOTAL, CKMB, CKMBINDEX, TROPONINI in the last 168 hours. BNP (last 3 results) No results for input(s): BNP in the last 8760 hours.  ProBNP (last 3 results) No results for input(s): PROBNP in the last 8760 hours.  CBG:  Recent Labs Lab 12/06/15 2035 12/06/15 2353 12/07/15 0435 12/07/15 0731 12/07/15 1147  GLUCAP 100* 125* 91 135* 90    Recent Results (from the past 240 hour(s))  Culture, Urine     Status: None   Collection Time: 12/02/15  5:30 AM  Result Value Ref Range Status   Specimen Description URINE, RANDOM  Final   Special Requests NONE  Final   Culture   Final    5,000 COLONIES/mL INSIGNIFICANT GROWTH Performed at [facility]    Report Status 12/03/2015 FINAL  Final  Culture, blood (Routine X 2) w Reflex to ID Panel     Status: None (Preliminary result)   Collection Time: 12/02/15  5:48 AM  Result Value Ref Range Status   Specimen Description BLOOD RIGHT HAND  Final   Special Requests IN PEDIATRIC BOTTLE 1.5CC  Final   Culture   Final    NO GROWTH 4 DAYS Performed at [facility]    Report Status PENDING  Incomplete  Culture, blood (Routine X 2) w Reflex to ID Panel     Status: None (Preliminary result)   Collection Time: 12/02/15  5:48 AM  Result Value Ref Range Status   Specimen Description BLOOD RIGHT FOREARM  Final   Special Requests IN PEDIATRIC BOTTLE [place]  Final   Culture   Final    NO GROWTH 4 DAYS Performed at [facility]    Report Status PENDING  Incomplete     Studies: Dg Chest 1 View  12/07/2015  CLINICAL DATA:  Seizure  activity EXAM: CHEST 1 VIEW COMPARISON:  12/02/2015 FINDINGS: Cardiac shadow is enlarged but stable. A nasogastric catheter is seen in the stomach. Patchy bibasilar changes are again seen and stable. No new focal abnormality is noted. IMPRESSION: Patchy bibasilar changes stable from the prior exam. Electronically Signed   By: Inez Catalina M.D.   On: 12/07/2015 10:13   Dg Abd 1 View  12/07/2015  CLINICAL DATA:  Check nasogastric catheter placement EXAM: ABDOMEN - 1 VIEW COMPARISON:  12/03/2015 FINDINGS: Nasogastric catheter is noted within the distal stomach. Scattered large and small bowel gas is noted. Mild persistent small bowel dilatation is noted. IMPRESSION: Nasogastric catheter within the stomach. Persistent small bowel dilatation is noted. Electronically Signed   By: Inez Catalina M.D.   On: 12/07/2015 10:14    Scheduled Meds:  Scheduled Meds: . antiseptic oral rinse  7 mL Mouth Rinse BID  . dextrose  1 ampule Intravenous Once  . heparin subcutaneous  5,000 Units Subcutaneous 3 times per day  . levETIRAcetam  750 mg Intravenous Q12H  . lip balm  1 application Topical BID  . neomycin-bacitracin-polymyxin   Topical Daily   Continuous Infusions: . dextrose 5 % and 0.9 % NaCl with KCl 20 mEq/L 50 mL/hr at 12/07/15 1242    Time spent on care of this patient: 35 min   [place], MD 12/07/2015, 2:34 PM  LOS: 6 days   [facility] Office  [phone] Pager - Text Page per [email or web address] If 7PM-7AM, please contact night-coverage [email or web address]

## 2015-12-07 NOTE — Progress Notes (Signed)
3 Days Post-Op  Subjective: He became confused last PM.This Am, almost fell, his alarm went off and they found him trying to walk.  They got him to the ground without hitting anything.  He is still confused this AM.  Wheezing some and breathing fast, few rales.  Ostomy looks fine, nothing from the ostomy so far.  No gas in bag.  He apparently pulled out his NG last PM also and it was replaced  Objective: Vital signs in last 24 hours: Temp:  [98.6 F (37 C)-100.7 F (38.2 C)] 99.4 F (37.4 C) (02/14 0806) Pulse Rate:  [86-114] 100 (02/14 0806) Resp:  [18-28] 24 (02/14 0806) BP: (134-158)/(53-70) 149/70 mmHg (02/14 0806) SpO2:  [92 %-94 %] 94 % (02/14 0806) Last BM Date:  (prior to surgery) 1800 IV 1050 urine 1200 from NG 5 liters Positive on I/O for admit TM 100.7, VSS, BP is borderline elevated, but will watch and have turned down fluids. WBC is normal BMP is pending Intake/Output from previous day: 02/13 0701 - 02/14 0700 In: 1830 [I.V.:1800] Out: 2250 [Urine:1050; Emesis/NG output:1200] Intake/Output this shift:    General appearance: alert and confused, RR is up and he definitely different this AM. Resp: some rales both bases and some wheezing right now. GI: soft, no BS, nothing in the ostomy bag right now.  Lab Results:   Recent Labs  12/05/15 0346  WBC 6.2  HGB 13.4  HCT 39.3  PLT 173    BMET  Recent Labs  12/05/15 0346 12/06/15 0434  NA 141 144  K 3.9 3.3*  CL 112* 112*  CO2 21* 24  GLUCOSE 136* 112*  BUN 13 12  CREATININE 1.12 1.02  CALCIUM 7.4* 7.5*   PT/INR No results for input(s): LABPROT, INR in the last 72 hours.   Recent Labs Lab 12/01/15 1632 12/02/15 0548  AST 39 26  ALT 21 12*  ALKPHOS 106 68  BILITOT 1.3* 1.3*  PROT 7.1 5.6*  ALBUMIN 3.9 2.9*     Lipase     Component Value Date/Time   LIPASE 26 12/01/2015 1632     Studies/Results: No results found.  Medications: . antiseptic oral rinse  7 mL Mouth Rinse BID  .  dextrose  1 ampule Intravenous Once  . heparin subcutaneous  5,000 Units Subcutaneous 3 times per day  . levETIRAcetam  750 mg Intravenous Q12H  . lip balm  1 application Topical BID  . neomycin-bacitracin-polymyxin   Topical Daily   . 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 Lucia Gaskins Acute renal failure with decreased urine output Tachycardia New confusion this AM DVT: SCD/heparin Antibiotics: He had 4 days of antibiotics and is now off antibiotics.    Plan:  i have turned down his IV and added some K+, I ordered labs for AM, and saw he still has a BMP still pending.  I have ordered a flat plate and CXR for this AM.  Awaiting bowel function.      LOS: 6 days    Jatoria Kneeland 12/07/2015

## 2015-12-07 NOTE — Progress Notes (Signed)
Pharmacy Antibiotic Note  [name] is a 60 y.o. male admitted on 2/8 with sepsis secondary to SBO, started on empiric broad spectrum antibiotics and now s/p bowel resection on 2/11.  Pharmacy was consulted to dose vancomycin and zosyn, which were d/c'ed on 2/12.  2/14 Events: Last night pt slightly confused and noted to have low grade fevers.  Chest Xray today reports stable from prior exam.  Chest Xray on 2/9 was suspicious for pneumonia or aspiration.  Zosyn to be restarted today.   Plan: Resume Zosyn 3.375g IV q8h (infuse over 4 hours) F/U cultures & sensitivities   Height: 5' 10"  (177.8 cm) Weight:  (pt is agitated and noncompliant) IBW/kg (Calculated) : 73  Temp (24hrs), Avg:99.6 F (37.6 C), Min:98.6 F (37 C), Max:100.7 F (38.2 C)   Recent Labs Lab 12/01/15 2118 12/01/15 2314 12/02/15 0548 12/02/15 0711 12/03/15 0540 12/04/15 0558 12/05/15 0346 12/06/15 0434 12/07/15 0404 12/07/15 0820 12/07/15 1043  WBC  --   --  7.0  --  7.1 6.9 6.2  --   --  7.9  --   CREATININE  --   --  1.67*  --  1.20 0.97 1.12 1.02  --  1.20  --   LATICACIDVEN 5.64* 5.8*  --  2.7*  --   --   --   --  0.9  --  1.0    Estimated Creatinine Clearance: 74.8 mL/min (by C-G formula based on Cr of 1.2).    No Known Allergies  Antimicrobials this admission: 2/8 >>zosyn  >> 2/12, restarted 2/14 >> 2/9 >> vancomycin >>  2/12  Dose adjustments this admission: 2/11: Vancomycin 774m q12h--> 1259mq12h for improved renal fxn  Microbiology results: 2/9 blood: NGTD 2/9 urine: 5K insignificant growth (final) 2/14 blood: 2/14 urine: 2/14 MRSA PCR:  Thank you for allowing pharmacy to be a part of this patient's care.  LeLeone HavenPharmD  12/07/2015, 3:03 PM

## 2015-12-07 NOTE — Progress Notes (Signed)
PT Cancellation Note  Patient Details Name: [name] MRN: [id number] DOB: 05-Sep-1956   Cancelled Treatment:    Reason Eval/Treat Not Completed: Medical issues which prohibited therapy (getting  tests done due to fever and confusion. check back later as schedule allows.)   [name] 12/07/2015, 11:33 AM Tresa Endo PT [phone]

## 2015-12-07 NOTE — Progress Notes (Addendum)
CSW received referral that pt admitted from [facility]. Pt may need SNF.   Awaiting PT, pt has tests today due to fever and confusion, pt has NG tube. CSW to follow up at a later time as pt not yet near discharge. CSW to follow up tomorrow and complete full psychosocial assessment tomorrow.   Alison Murray, MSW, [place] Work [phone]

## 2015-12-08 LAB — GLUCOSE, CAPILLARY
GLUCOSE-CAPILLARY: 109 mg/dL — AB (ref 65–99)
GLUCOSE-CAPILLARY: 121 mg/dL — AB (ref 65–99)
GLUCOSE-CAPILLARY: 92 mg/dL (ref 65–99)
Glucose-Capillary: 106 mg/dL — ABNORMAL HIGH (ref 65–99)
Glucose-Capillary: 105 mg/dL — ABNORMAL HIGH (ref 65–99)
Glucose-Capillary: 108 mg/dL — ABNORMAL HIGH (ref 65–99)

## 2015-12-08 LAB — COMPREHENSIVE METABOLIC PANEL
ALBUMIN: 1.9 g/dL — AB (ref 3.5–5.0)
ALT: 11 U/L — AB (ref 17–63)
AST: 15 U/L (ref 15–41)
Alkaline Phosphatase: 57 U/L (ref 38–126)
Anion gap: 7 (ref 5–15)
BUN: 12 mg/dL (ref 6–20)
CHLORIDE: 114 mmol/L — AB (ref 101–111)
CO2: 25 mmol/L (ref 22–32)
Calcium: 7.7 mg/dL — ABNORMAL LOW (ref 8.9–10.3)
Creatinine, Ser: 1.09 mg/dL (ref 0.61–1.24)
GFR calc Af Amer: >60 mL/min (ref >60)
GFR calc non Af Amer: >60 mL/min (ref >60)
GLUCOSE: 127 mg/dL — AB (ref 65–99)
POTASSIUM: 3.3 mmol/L — AB (ref 3.5–5.1)
Sodium: 146 mmol/L — ABNORMAL HIGH (ref 135–145)
Total Bilirubin: 2.2 mg/dL — ABNORMAL HIGH (ref 0.3–1.2)
Total Protein: 5.2 g/dL — ABNORMAL LOW (ref 6.5–8.1)

## 2015-12-08 LAB — QUANTIFERON TB GOLD ASSAY (BLOOD)

## 2015-12-08 LAB — QUANTIFERON IN TUBE
QFT TB AG MINUS NIL VALUE: 0 IU/mL
QUANTIFERON MITOGEN VALUE: 0.21 IU/mL
QUANTIFERON NIL VALUE: 0.03 [IU]/mL
QUANTIFERON TB AG VALUE: 0.03 IU/mL
QUANTIFERON TB GOLD: UNDETERMINED

## 2015-12-08 MED ORDER — POTASSIUM CHLORIDE 10 MEQ/100ML IV SOLN
10.0000 meq | INTRAVENOUS | Status: AC
  Administered 2015-12-08 (×4): 10 meq via INTRAVENOUS
  Filled 2015-12-08 (×4): qty 100

## 2015-12-08 NOTE — Care Management Note (Signed)
Case Management Note  Patient Details  Name: DEMONT LINFORD MRN: [id number] Date of Birth: 1956-01-31  Subjective/Objective:                   60 yo admitted with SBO  Action/Plan: From [facility]  Expected Discharge Date:   (unknown)               Expected Discharge Plan:  Skilled Nursing Facility  In-House Referral:  Clinical Social Work  Discharge planning Services  CM Consult  Post Acute Care Choice:    Choice offered to:     DME Arranged:    DME Agency:     HH Arranged:    [place] Agency:     Status of Service:  Completed, signed off  Medicare Important Message Given:  Yes Date Medicare IM Given:    Medicare IM give by:    Date Additional Medicare IM Given:    Additional Medicare Important Message give by:     If discussed at [place] of Stay Meetings, dates discussed:    Additional CommentsLynnell Catalan, RN 12/08/2015, 2:07 PM [phone]

## 2015-12-08 NOTE — Consult Note (Signed)
WOC wound consult note Reason for Consult: place NPWT VAC dressing to midline wound Wound type: surgical  Measurement: 9cm x 4cm x 3cm  Wound bed:100% subcutaneous tissue, clean, moist Drainage (amount, consistency, odor) serosanguinous  Periwound: intact  Dressing procedure/placement/frequency: 1pc of black foam used to fill wound bed, draped and sealed at 137mHG.  Pt slept through dressing application.  Uncomplicated VAC dressing, will be ok for bedside nursing staff to change M/W/F.   WOC ostomy follow up Stoma type/location: LLQ, end colostomy Stomal assessment/size:  1 3/4" x 1 5/8" oval shaped, slightly budded, pink, some mucosal sloughing Peristomal assessment: intact  Treatment options for stomal/peristomal skin: added 2" skin barrier today since the wound is only slightly budded from the skin  Output liquid, brown Ostomy pouching: 1pc flat with 2" barrier ring Education provided: long visit with patient, however he was sleeping. Cousin ([name] at the bedside,explained rationale for NPWT VAC dressing, demonstrated pouch change and explained each step.   KYvone Neurequested [place] I [place] to discuss level of care needed after DC related to ostomy and wound care.   Sister reports she does not feel patient will be able to learn car of ostomy or even to empty until he is much, much improved and for that reason he will need SNF at DC.  Answered all their questions, again patient sleeping, recently medicated for pain with IV MSO4. [place] No  Contacted SW to update her on status and addition of NPWT VAC dressing.    [facility] will follow along for ostomy education as needed. Fay Bagg ALeonRN,CWOCN [phone]

## 2015-12-08 NOTE — Progress Notes (Signed)
Patient ID: [name], male   DOB: January 21, 1956, 60 y.o.   MRN: [id number]     [address] [place], [place] [address]    Phone: [phone] FAX: [phone]     Subjective: Alert to person, states he's at [facility].  ngt 864m out. Ostomy functioning.   Objective:  Vital signs:  Filed Vitals:   12/07/15 1701 12/07/15 1833 12/07/15 2339 12/08/15 0430  BP: 143/60 150/62 158/68 155/72  Pulse: 95 91 90 94  Temp: 98.2 F (36.8 C) 98.5 F (36.9 C) 99.6 F (37.6 C) 99.9 F (37.7 C)  TempSrc: Oral Oral Oral Axillary  Resp: 28 28 20 20   Height:      Weight:      SpO2: 94% 91% 93% 95%    Last BM Date: 12/08/15  Intake/Output   Yesterday:  02/14 0701 - 02/15 0700 In: 837.5 [I.V.:347.5; IV Piggyback:250] Out: 3075 [[KXFGH:8299 Emesis/NG output:800; Stool:700] This shift:   Physical Exam: General: Pt awake/alert/oriented to person.  No acute distress  Abdomen: Soft.  Nondistended.   Mildly tender at incisions only. Wound is clean, fascia is intact.  No evidence of peritonitis.  Ostomy superficial dark, but viable underneath.  Air and liquid stool.    Problem List:   Principal Problem:   SBO (small bowel obstruction) (HCC) Active Problems:   Crohn's disease ([facility]   Personal history of immunosupression therapy   Seizure disorder ([facility]   S/P partial colectomy for Crohn's disease   ARF (acute renal failure) ([facility]   SIRS (systemic inflammatory response syndrome) ([facility]   Crohn's disease of both small and large intestine with fistula ([facility]    Results:   Labs: Results for orders placed or performed during the hospital encounter of 12/01/15 (from the past 48 hour(s))  Glucose, capillary     Status: None   Collection Time: 12/06/15 11:56 AM  Result Value Ref Range   Glucose-Capillary 87 65 - 99 mg/dL  Glucose, capillary     Status: Abnormal   Collection Time: 12/06/15  4:10 PM  Result Value Ref  Range   Glucose-Capillary 112 (H) 65 - 99 mg/dL   Comment 1 Notify RN    Comment 2 Document in Chart   Glucose, capillary     Status: Abnormal   Collection Time: 12/06/15  8:35 PM  Result Value Ref Range   Glucose-Capillary 100 (H) 65 - 99 mg/dL  Glucose, capillary     Status: Abnormal   Collection Time: 12/06/15 11:53 PM  Result Value Ref Range   Glucose-Capillary 125 (H) 65 - 99 mg/dL  Lactic acid, plasma     Status: None   Collection Time: 12/07/15  4:04 AM  Result Value Ref Range   Lactic Acid, Venous 0.9 0.5 - 2.0 mmol/L  Glucose, capillary     Status: None   Collection Time: 12/07/15  4:35 AM  Result Value Ref Range   Glucose-Capillary 91 65 - 99 mg/dL  Glucose, capillary     Status: Abnormal   Collection Time: 12/07/15  7:31 AM  Result Value Ref Range   Glucose-Capillary 135 (H) 65 - 99 mg/dL   Comment 1 Notify RN    Comment 2 Document in Chart   Basic metabolic panel     Status: Abnormal   Collection Time: 12/07/15  8:20 AM  Result Value Ref Range   Sodium 148 (H) 135 - 145 mmol/L   Potassium  3.3 (L) 3.5 - 5.1 mmol/L   Chloride 116 (H) 101 - 111 mmol/L   CO2 21 (L) 22 - 32 mmol/L   Glucose, Bld 152 (H) 65 - 99 mg/dL   BUN 13 6 - 20 mg/dL   Creatinine, Ser 1.20 0.61 - 1.24 mg/dL   Calcium 7.6 (L) 8.9 - 10.3 mg/dL   GFR calc non Af Amer >60 >60 mL/min   GFR calc Af Amer >60 >60 mL/min    Comment: (NOTE) The eGFR has been calculated using the CKD EPI equation. This calculation has not been validated in all clinical situations. eGFR's persistently <60 mL/min signify possible Chronic Kidney Disease.    Anion gap 11 5 - 15  CBC     Status: Abnormal   Collection Time: 12/07/15  8:20 AM  Result Value Ref Range   WBC 7.9 4.0 - 10.5 K/uL   RBC 3.39 (L) 4.22 - 5.81 MIL/uL   Hemoglobin 11.9 (L) 13.0 - 17.0 g/dL   HCT 36.8 (L) 39.0 - 52.0 %   MCV 108.6 (H) 78.0 - 100.0 fL   MCH 35.1 (H) 26.0 - 34.0 pg   MCHC 32.3 30.0 - 36.0 g/dL   RDW 13.8 11.5 - 15.5 %    Platelets 252 150 - 400 K/uL  Lactic acid, plasma     Status: None   Collection Time: 12/07/15 10:43 AM  Result Value Ref Range   Lactic Acid, Venous 1.0 0.5 - 2.0 mmol/L  Procalcitonin - Baseline     Status: None   Collection Time: 12/07/15 10:43 AM  Result Value Ref Range   Procalcitonin 0.82 ng/mL    Comment:        Interpretation: PCT > 0.5 ng/mL and <= 2 ng/mL: Systemic infection (sepsis) is possible, but other conditions are known to elevate PCT as well. (NOTE)         ICU PCT Algorithm               Non ICU PCT Algorithm    ----------------------------     ------------------------------         PCT < 0.25 ng/mL                 PCT < 0.1 ng/mL     Stopping of antibiotics            Stopping of antibiotics       strongly encouraged.               strongly encouraged.    ----------------------------     ------------------------------       PCT level decrease by               PCT < 0.25 ng/mL       >= 80% from peak PCT       OR PCT 0.25 - 0.5 ng/mL          Stopping of antibiotics                                             encouraged.     Stopping of antibiotics           encouraged.    ----------------------------     ------------------------------       PCT level decrease by              PCT >=  0.25 ng/mL       < 80% from peak PCT        AND PCT >= 0.5 ng/mL             Continuing antibiotics                                              encouraged.       Continuing antibiotics            encouraged.    ----------------------------     ------------------------------     PCT level increase compared          PCT > 0.5 ng/mL         with peak PCT AND          PCT >= 0.5 ng/mL             Escalation of antibiotics                                          strongly encouraged.      Escalation of antibiotics        strongly encouraged.   Glucose, capillary     Status: None   Collection Time: 12/07/15 11:47 AM  Result Value Ref Range   Glucose-Capillary 90 65 - 99 mg/dL    Comment 1 Notify RN    Comment 2 Document in Chart   Urinalysis, Routine w reflex microscopic (not at [facility])     Status: Abnormal   Collection Time: 12/07/15 12:25 PM  Result Value Ref Range   Color, Urine ORANGE (A) YELLOW    Comment: BIOCHEMICALS MAY BE AFFECTED BY COLOR   APPearance CLOUDY (A) CLEAR   Specific Gravity, Urine 1.022 1.005 - 1.030   pH 5.5 5.0 - 8.0   Glucose, UA NEGATIVE NEGATIVE mg/dL   Hgb urine dipstick TRACE (A) NEGATIVE   Bilirubin Urine MODERATE (A) NEGATIVE   Ketones, ur NEGATIVE NEGATIVE mg/dL   Protein, ur 100 (A) NEGATIVE mg/dL   Nitrite POSITIVE (A) NEGATIVE   Leukocytes, UA SMALL (A) NEGATIVE  Urine microscopic-add on     Status: Abnormal   Collection Time: 12/07/15 12:25 PM  Result Value Ref Range   Squamous Epithelial / LPF 0-5 (A) NONE SEEN   WBC, UA 0-5 0 - 5 WBC/hpf   RBC / HPF 0-5 0 - 5 RBC/hpf   Bacteria, UA MANY (A) NONE SEEN   Casts GRANULAR CAST (A) NEGATIVE   Urine-Other MUCOUS PRESENT   Glucose, capillary     Status: Abnormal   Collection Time: 12/07/15  5:02 PM  Result Value Ref Range   Glucose-Capillary 117 (H) 65 - 99 mg/dL   Comment 1 Notify RN    Comment 2 Document in Chart   MRSA PCR Screening     Status: None   Collection Time: 12/07/15  6:16 PM  Result Value Ref Range   MRSA by PCR NEGATIVE NEGATIVE    Comment:        The GeneXpert MRSA Assay (FDA approved for NASAL specimens only), is one component of a comprehensive MRSA colonization surveillance program. It is not intended to diagnose MRSA infection nor to guide or monitor treatment for MRSA infections.   Glucose, capillary  Status: Abnormal   Collection Time: 12/07/15  8:42 PM  Result Value Ref Range   Glucose-Capillary 107 (H) 65 - 99 mg/dL   Comment 1 Notify RN   Glucose, capillary     Status: Abnormal   Collection Time: 12/08/15  1:26 AM  Result Value Ref Range   Glucose-Capillary 106 (H) 65 - 99 mg/dL   Comment 1 Notify RN   Comprehensive metabolic  panel     Status: Abnormal   Collection Time: 12/08/15  3:47 AM  Result Value Ref Range   Sodium 146 (H) 135 - 145 mmol/L   Potassium 3.3 (L) 3.5 - 5.1 mmol/L   Chloride 114 (H) 101 - 111 mmol/L   CO2 25 22 - 32 mmol/L   Glucose, Bld 127 (H) 65 - 99 mg/dL   BUN 12 6 - 20 mg/dL   Creatinine, Ser 1.09 0.61 - 1.24 mg/dL   Calcium 7.7 (L) 8.9 - 10.3 mg/dL   Total Protein 5.2 (L) 6.5 - 8.1 g/dL   Albumin 1.9 (L) 3.5 - 5.0 g/dL   AST 15 15 - 41 U/L   ALT 11 (L) 17 - 63 U/L   Alkaline Phosphatase 57 38 - 126 U/L   Total Bilirubin 2.2 (H) 0.3 - 1.2 mg/dL   GFR calc non Af Amer >60 >60 mL/min   GFR calc Af Amer >60 >60 mL/min    Comment: (NOTE) The eGFR has been calculated using the CKD EPI equation. This calculation has not been validated in all clinical situations. eGFR's persistently <60 mL/min signify possible Chronic Kidney Disease.    Anion gap 7 5 - 15  Glucose, capillary     Status: Abnormal   Collection Time: 12/08/15  4:37 AM  Result Value Ref Range   Glucose-Capillary 105 (H) 65 - 99 mg/dL   Comment 1 Notify RN   Glucose, capillary     Status: Abnormal   Collection Time: 12/08/15  7:38 AM  Result Value Ref Range   Glucose-Capillary 109 (H) 65 - 99 mg/dL   Comment 1 Notify RN     Imaging / Studies: Dg Chest 1 View  12/07/2015  CLINICAL DATA:  Seizure activity EXAM: CHEST 1 VIEW COMPARISON:  12/02/2015 FINDINGS: Cardiac shadow is enlarged but stable. A nasogastric catheter is seen in the stomach. Patchy bibasilar changes are again seen and stable. No new focal abnormality is noted. IMPRESSION: Patchy bibasilar changes stable from the prior exam. Electronically Signed   By: Inez Catalina M.D.   On: 12/07/2015 10:13   Dg Abd 1 View  12/07/2015  CLINICAL DATA:  Check nasogastric catheter placement EXAM: ABDOMEN - 1 VIEW COMPARISON:  12/03/2015 FINDINGS: Nasogastric catheter is noted within the distal stomach. Scattered large and small bowel gas is noted. Mild persistent small  bowel dilatation is noted. IMPRESSION: Nasogastric catheter within the stomach. Persistent small bowel dilatation is noted. Electronically Signed   By: Inez Catalina M.D.   On: 12/07/2015 10:14    Medications / Allergies:  Scheduled Meds: . antiseptic oral rinse  7 mL Mouth Rinse BID  . dextrose  1 ampule Intravenous Once  . heparin subcutaneous  5,000 Units Subcutaneous 3 times per day  . levETIRAcetam  750 mg Intravenous Q12H  . lip balm  1 application Topical BID  . neomycin-bacitracin-polymyxin   Topical Daily  . piperacillin-tazobactam (ZOSYN)  IV  3.375 g Intravenous Q8H   Continuous Infusions: . dextrose 150 mL/hr at 12/08/15 0123   PRN Meds:.acetaminophen, acetaminophen **OR**  acetaminophen, alum & mag hydroxide-simeth, bisacodyl, diphenhydrAMINE, magic mouthwash, menthol-cetylpyridinium, morphine injection, ondansetron (ZOFRAN) IV **OR** ondansetron (ZOFRAN) IV, ondansetron **OR** ondansetron (ZOFRAN) IV, ondansetron, phenol, phenol, promethazine  Antibiotics: Anti-infectives    Start     Dose/Rate Route Frequency Ordered Stop   12/07/15 1500  piperacillin-tazobactam (ZOSYN) IVPB 3.375 g     3.375 g 12.5 mL/hr over 240 Minutes Intravenous Every 8 hours 12/07/15 1455     12/04/15 2000  vancomycin (VANCOCIN) 1,250 mg in sodium chloride 0.9 % 250 mL IVPB  Status:  Discontinued     1,250 mg 166.7 mL/hr over 90 Minutes Intravenous Every 12 hours 12/04/15 1615 12/05/15 0759   12/02/15 2000  vancomycin (VANCOCIN) IVPB 750 mg/150 ml premix  Status:  Discontinued     750 mg 150 mL/hr over 60 Minutes Intravenous Every 12 hours 12/02/15 0735 12/04/15 1614   12/02/15 1400  piperacillin-tazobactam (ZOSYN) IVPB 3.375 g  Status:  Discontinued     3.375 g 12.5 mL/hr over 240 Minutes Intravenous 3 times per day 12/02/15 0459 12/05/15 0759   12/02/15 0800  vancomycin (VANCOCIN) 2,000 mg in sodium chloride 0.9 % 500 mL IVPB     2,000 mg 250 mL/hr over 120 Minutes Intravenous  Once 12/02/15  0730 12/02/15 1043   12/02/15 0500  piperacillin-tazobactam (ZOSYN) IVPB 3.375 g     3.375 g 100 mL/hr over 30 Minutes Intravenous  Once 12/02/15 0457 12/02/15 0647        Assessment/Plan Crohns disease Small bowel obstruction secondary to 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 Lucia Gaskins -stable, continue NGT today, will consider clamping trial soon -place wound VAC -WOC following Confusion-possibly from UTI, still confused, but pleasant, sitting up in chair ID-UTI, on zosyn.  BC negative to date FEN-NGT, clears soon, IVF VTE prophylaxis-SCD/heparin   Erby Pian, [facility] Pager [phone](7A-4:30P)   12/08/2015 10:13 AM

## 2015-12-08 NOTE — Progress Notes (Signed)
CSW continuing to follow.   Pt admitted from [facility], but pt will need SNF upon discharge as pt is a new colostomy and had Wound VAC placed today.   CSW contacted pt sister/POA, Roseanne Reno via telephone. CSW discussed with POA that CSW is following, but at this time pt is not yet appropriate to explore SNF options as pt has an NG tube to suction and once pt closer to having NG tube removed then CSW will initiate SNF search. Pt sister expressed understanding and agreeable to plan for SNF upon discharge.   CSW to continue to follow and update pt sister regarding when appropriate to explore SNF options.   Alison Murray, MSW, [place] [facility] [phone]

## 2015-12-08 NOTE — Progress Notes (Signed)
Nutrition Follow-up  DOCUMENTATION CODES:   Obesity unspecified  INTERVENTION:   -If diet is unable to be advanced in the next 24 hours, recommend initiation of nutrition support. Pt has been NPO for 7 days. -Diet advancement per surgery -RD to continue to monitor  NUTRITION DIAGNOSIS:   Inadequate oral intake related to inability to eat as evidenced by NPO status.  Ongoing.  GOAL:   Patient will meet greater than or equal to 90% of their needs  Not meeting, NPO.  MONITOR:   Diet advancement, Weight trends, Labs, I & O's  REASON FOR ASSESSMENT:   Malnutrition Screening Tool    ASSESSMENT:   60 y.o. male with history of seizures, Crohn's disease, mild mental cognitive defect was brought to the ER after patient had multiple episodes of nausea vomiting. As per patient patient had a normal bowel movement 24 hours ago following which patient started developing abdominal pain started off in the lower abdomen. Following which patient had multiple episodes of nausea and vomiting. Patient presented later today to the ER. CT abdomen and pelvis shows high-grade small bowel obstruction. On-call surgeon Dr. Johney Maine was consulted and patient had NG tube placed with at least 500 mL of dark fluid being suctioned. In addition patient has not making much urine since morning. Creatinine is elevated. Patient is also in the low blood pressure range with tachycardia for which patient has been given 3 L of fluid.  Patient's diet has not been advanced, he has been NPO x 8 days, last meal was 2/7 which he stated in initial assessment. Pt with NGT for suction with 800 ml drainage last documented. Pt with confusion today. Per surgery note, clears may be started soon. Will monitor for tolerance and advancement. Pt may need nutrition support if diet is unable to be advanced or tolerated.  IVF: D5 @ 150 ml/hr -> providing 612 kcal  Labs reviewed: CBGs: 105-109 Elevated Na Low K  Diet Order:  Diet NPO  time specified Except for: Sips with Meds  Skin:  Wound (see comment) (abdominal incision)  Last BM:  2/15  Height:   Ht Readings from Last 1 Encounters:  12/05/15 5' 10"  (1.778 m)    Weight:   Wt Readings from Last 1 Encounters:  12/05/15 209 lb 7 oz (95 kg)    Ideal Body Weight:  75.45 kg (kg)  BMI:  Body mass index is 30.05 kg/(m^2).  Estimated Nutritional Needs:   Kcal:  1950-2150  Protein:  90-100g  Fluid:  2 L/day  EDUCATION NEEDS:   No education needs identified at this time  Clayton Bibles, MS, RD, LDN Pager: [phone] After Hours Pager: [phone]

## 2015-12-08 NOTE — Evaluation (Signed)
Physical Therapy Evaluation Patient Details Name: [name] MRN: [id number] DOB: [birth year] Today's Date: 12/08/2015   History of Present Illness  Laparoscopic enterolysis of adhesions (45 minutes), laparoscopic-assisted small bowel resection, hand assisted left hemicolectomy with mobilization of splenic flexure, and left transverse colon end colostomy on 2/11.  Clinical Impression  Patient is pleasant and able to participate in mobility. Patient  Will benefit from PT to address problems listed in the note below . Recommend SNF.    Follow Up Recommendations SNF;Supervision/Assistance - 24 hour    Equipment Recommendations  Rolling walker with 5" wheels    Recommendations for Other Services       Precautions / Restrictions Precautions Precautions: Fall Precaution Comments: colostomy, open ab wound,mentally challenged, from ALF, NG suction      Mobility  Bed Mobility Overal bed mobility: Needs Assistance Bed Mobility: Supine to Sit     Supine to sit: Min assist     General bed mobility comments: cues for safety with abdomen incision  Transfers Overall transfer level: Needs assistance Equipment used: Rolling walker (2 wheeled) Transfers: Sit to/from [facility] Sit to Stand: Mod assist;+2 safety/equipment         General transfer comment: extra time  Ambulation/Gait Ambulation/Gait assistance: Mod assist;+2 safety/equipment Ambulation Distance (Feet): 10 Feet Assistive device: Rolling walker (2 wheeled) Gait Pattern/deviations: Step-through pattern;Step-to pattern     General Gait Details: multimodal cues for use of RW  Stairs            Wheelchair Mobility    Modified Rankin (Stroke Patients Only)       Balance                                             Pertinent Vitals/Pain Pain Assessment: Faces Faces Pain Scale: Hurts little more Pain Location: abdomen Pain Descriptors / Indicators:  Discomfort;Grimacing;Guarding Pain Intervention(s): Limited activity within patient's tolerance;Monitored during session    Home Living Family/patient expects to be discharged to:: Assisted living               Home Equipment: None      Prior Function           Comments: per family memeber, patient was able to take care of personal needs and ambulatory.     Hand Dominance        Extremity/Trunk Assessment   Upper Extremity Assessment: Generalized weakness           Lower Extremity Assessment: Generalized weakness      Cervical / Trunk Assessment: Normal  Communication   Communication: Receptive difficulties;Expressive difficulties  Cognition Arousal/Alertness: Awake/alert Behavior During Therapy: WFL for tasks assessed/performed Overall Cognitive Status: History of cognitive impairments - at baseline                      General Comments      Exercises        Assessment/Plan    PT Assessment Patient needs continued PT services  PT Diagnosis Difficulty walking;Generalized weakness;Altered mental status   PT Problem List Decreased strength;Decreased range of motion;Decreased activity tolerance;Decreased mobility;Decreased cognition;Decreased knowledge of precautions;Decreased safety awareness;Decreased knowledge of use of DME  PT Treatment Interventions DME instruction;Gait training;Functional mobility training;Therapeutic activities;Therapeutic exercise;Patient/family education   PT Goals (Current goals can be found in the Care Plan section) Acute Rehab PT Goals Patient Stated Goal:  to go back to ALF to be near his mother-per family PT Goal Formulation: With family Time For Goal Achievement: 12/08/15 Potential to Achieve Goals: Good    Frequency Min 3X/week   Barriers to discharge        Co-evaluation               End of Session Equipment Utilized During Treatment: Gait belt Activity Tolerance: Patient tolerated treatment  well Patient left: in chair;with call bell/phone within reach;with chair alarm set;with family/visitor present;with nursing/sitter in room Nurse Communication: Mobility status         Time: 7782-4235 PT Time Calculation (min) (ACUTE ONLY): 15 min   Charges:   PT Evaluation $PT Eval Low Complexity: 1 Procedure     PT G CodesClaretha Cooper 12/08/2015, 1:22 PM Tresa Endo PT [phone]

## 2015-12-08 NOTE — Progress Notes (Signed)
[facility] Progress Note   [name]  ELF:[id number]  DOB: [birth year]  DOA: 12/01/2015 PCP: No PCP Per Patient  Brief narrative: [name] is a 60 y.o. male male with history of seizures, Crohn's disease, mild mental cognitive defect was brought to the ER after patient had multiple episodes of nausea & vomiting. CT abd and pelvis show high grade obstruction. NG tube placed in ER. Also noted to have ARF and severe lactic acidosis.  As obstruction did not resolve, he underwent Ex lap with bowel resection and colostomy. He appeared to be recovering well until 2/13 when he pulled out his NG tube and became slightly confused. He was noted to have low grade fevers through the night.    Subjective: No complaint of nausea, cough or dyspnea. Abdominal pan is being controlled with narcotics.  Assessment/Plan: Principal Problem:   SIRS (systemic inflammatory response syndrome)- possible pneumonia?? - initially due to SBO- resolved after surgery - continue frequent vital signs to monitor for fever recurrence -  UA shows bacteria Nitrite + and only 0-5 WBC- will send for culture - Lactic acid and procalcitonin normal - have asked for condom cath and strict I and O to ensure he has adequate urine output and is not becoming septic - Patient with an episode on 2/13 overnight. His NG tube, since then having cough, fever, started empirically on IV Zosyn for aspiration pneumonia, continue to monitor closely.  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 -renal function has normalized    Antibiotics: Anti-infectives    Start     Dose/Rate Route Frequency Ordered Stop   12/07/15 1500  piperacillin-tazobactam (ZOSYN) IVPB 3.375 g     3.375 g 12.5 mL/hr over 240 Minutes Intravenous Every 8 hours 12/07/15 1455     12/04/15 2000  vancomycin (VANCOCIN) 1,250 mg in sodium chloride 0.9 % 250 mL IVPB  Status:  Discontinued     1,250 mg 166.7 mL/hr over 90 Minutes Intravenous Every 12 hours 12/04/15 1615 12/05/15 0759   12/02/15 2000  vancomycin (VANCOCIN) IVPB 750 mg/150 ml premix  Status:  Discontinued     750 mg 150 mL/hr over 60 Minutes Intravenous Every 12 hours 12/02/15 0735 12/04/15 1614   12/02/15 1400  piperacillin-tazobactam (ZOSYN) IVPB 3.375 g  Status:  Discontinued     3.375 g 12.5 mL/hr over 240 Minutes Intravenous 3 times per day 12/02/15 0459 12/05/15 0759   12/02/15 0800  vancomycin (VANCOCIN) 2,000 mg in sodium chloride 0.9 % 500 mL IVPB     2,000 mg 250 mL/hr over 120 Minutes Intravenous  Once 12/02/15 0730 12/02/15 1043   12/02/15 0500  piperacillin-tazobactam (ZOSYN) IVPB 3.375 g     3.375 g 100 mL/hr over 30 Minutes Intravenous  Once 12/02/15 0457 12/02/15 0647     Code Status:     Code Status Orders        Start     Ordered   12/02/15 0131  Full code   Continuous     12/02/15 0130    Code Status History    Date Active Date Inactive Code Status Order ID Comments User Context   03/31/2014  5:33 PM 04/02/2014  7:04 PM Full Code [id number]  Janece Canterbury, MD Inpatient    Advance Directive Documentation        Most Recent Value   Type of Advance Directive  Healthcare Power of Attorney [sister [name] is POA (in [place])]   Pre-existing out of facility DNR order (yellow form or pink MOST form)     "MOST"  Form in Place?       Family Communication: None at bedside Disposition Plan: expect 2-3 day hospital stay at minimal DVT prophylaxis: Heparin Consultants: gen surgery, GI Procedures: none    Objective: Filed Weights   12/05/15 0900 12/05/15 1008  Weight: 95.255 kg (210 lb) 95 kg (209 lb 7 oz)    Intake/Output Summary (Last 24 hours) at 12/08/15 1430 Last data filed at 12/08/15 1300  Gross per 24 hour  Intake  717.5 ml  Output   2475 ml  Net -1757.5 ml     Vitals Filed Vitals:   12/07/15 1833 12/07/15 2339 12/08/15 0430 12/08/15 1355  BP: 150/62 158/68 155/72 134/50  Pulse: 91 90 94 88  Temp: 98.5 F (36.9 C) 99.6 F (37.6 C) 99.9 F (37.7 C) 100.4 F (38 C)  TempSrc: Oral Oral Axillary Oral  Resp: 28 20 20 18   Height:      Weight:      SpO2: 91% 93% 95% 97%    Exam:  General:  Pt is alert, not in acute distress  HEENT: No icterus, No thrush, oral mucosa moist  Cardiovascular: regular rate and rhythm, S1/S2 No murmur  Respiratory: clear to auscultation bilaterally - has upper airway congestion  Abdomen: Soft, + Bowel sounds, non tender, NG tube draining brown fluid, non distended, no guarding  MSK: No cyanosis or clubbing- no pedal edema   Data Reviewed: Basic Metabolic Panel:  Recent Labs Lab 12/04/15 0558 12/05/15 0346 12/06/15 0434 12/07/15 0820 12/08/15 0347  NA 140 141 144 148* 146*  K 3.7 3.9 3.3* 3.3* 3.3*  CL 111 112* 112* 116* 114*  CO2 22 21* 24 21* 25  GLUCOSE 110* 136* 112* 152* 127*  BUN 14 13 12 13 12   CREATININE 0.97 1.12 1.02 1.20 1.09  CALCIUM 7.8* 7.4* 7.5* 7.6* 7.7*   Liver Function Tests:  Recent Labs Lab 12/01/15 1632 12/02/15 0548 12/08/15 0347  AST 39 26 15  ALT 21 12* 11*  ALKPHOS 106 68 57  BILITOT 1.3* 1.3* 2.2*  PROT 7.1 5.6* 5.2*  ALBUMIN 3.9 2.9* 1.9*    Recent Labs Lab 12/01/15 1632  LIPASE 26   No results for input(s): AMMONIA in the last 168 hours. CBC:  Recent Labs Lab 12/01/15 1632  12/02/15 0548 12/03/15 0540 12/04/15 0558 12/05/15 0346 12/07/15 0820  WBC 8.5 7.0 7.1 6.9 6.2 7.9  NEUTROABS 7.7 5.7 6.1  --  4.5  --   HGB 18.8* 14.9 13.2 13.0 13.4 11.9*  HCT 56.7* 46.1 40.9 40.2 39.3 36.8*  MCV 109.5* 109.8* 111.4* 109.8* 106.8* 108.6*  PLT 308 187 181 170 173 252   Cardiac Enzymes: No results for input(s):  CKTOTAL, CKMB, CKMBINDEX, TROPONINI in the last 168 hours. BNP (last 3 results) No results for input(s): BNP in the last 8760 hours.  ProBNP (last 3 results) No results for input(s): PROBNP in the last 8760 hours.  CBG:  Recent Labs Lab 12/07/15 2042 12/08/15 0126 12/08/15 0437 12/08/15 0738 12/08/15 1135  GLUCAP 107* 106* 105* 109* 121*    Recent Results (from the past 240 hour(s))  Culture, Urine     Status: None   Collection Time: 12/02/15  5:30 AM  Result Value Ref Range Status   Specimen Description URINE, RANDOM  Final   Special Requests NONE  Final   Culture   Final    5,000 COLONIES/mL INSIGNIFICANT GROWTH Performed at [facility]    Report Status 12/03/2015 FINAL  Final  Culture, blood (Routine X 2) w Reflex to ID Panel     Status: None   Collection Time: 12/02/15  5:48 AM  Result Value Ref Range Status   Specimen Description BLOOD RIGHT HAND  Final   Special Requests IN PEDIATRIC BOTTLE 1.5CC  Final   Culture   Final    NO GROWTH 5 DAYS Performed at [facility]    Report Status 12/07/2015 FINAL  Final  Culture, blood (Routine X 2) w Reflex to ID Panel     Status: None   Collection Time: 12/02/15  5:48 AM  Result Value Ref Range Status   Specimen Description BLOOD RIGHT FOREARM  Final   Special Requests IN PEDIATRIC BOTTLE [place]  Final   Culture   Final    NO GROWTH 5 DAYS Performed at [facility]    Report Status 12/07/2015 FINAL  Final  Culture, blood (Routine X 2) w Reflex to ID Panel     Status: None (Preliminary result)   Collection Time: 12/07/15 10:43 AM  Result Value Ref Range Status    Specimen Description BLOOD RIGHT HAND  Final   Special Requests BOTTLES DRAWN AEROBIC AND ANAEROBIC 8CC  Final   Culture   Final    NO GROWTH < 24 HOURS Performed at [facility]    Report Status PENDING  Incomplete  Culture, blood (Routine X 2) w Reflex to ID Panel     Status: None (Preliminary result)   Collection Time: 12/07/15 10:45 AM  Result Value Ref Range Status   Specimen Description BLOOD LEFT HAND  Final   Special Requests BOTTLES DRAWN AEROBIC AND ANAEROBIC 10CC  Final   Culture   Final    NO GROWTH < 24 HOURS Performed at [facility]    Report Status PENDING  Incomplete  Urine culture     Status: None (Preliminary result)   Collection Time: 12/07/15 12:25 PM  Result Value Ref Range Status   Specimen Description URINE, RANDOM  Final   Special Requests NONE  Final   Culture   Final    NO GROWTH < 24 HOURS Performed at [facility]    Report Status PENDING  Incomplete  MRSA PCR Screening     Status: None   Collection Time: 12/07/15  6:16 PM  Result Value Ref Range Status   MRSA by PCR NEGATIVE NEGATIVE Final    Comment:        The GeneXpert MRSA Assay (FDA approved for NASAL specimens only), is one component of a comprehensive MRSA colonization surveillance program. It is not intended to diagnose MRSA infection nor to guide or monitor treatment for MRSA infections.  Studies: Dg Chest 1 View  12/07/2015  CLINICAL DATA:  Seizure activity EXAM: CHEST 1 VIEW COMPARISON:  12/02/2015 FINDINGS: Cardiac shadow is enlarged but stable. A nasogastric catheter is seen in the stomach. Patchy bibasilar changes are again seen and stable. No new focal abnormality is noted. IMPRESSION: Patchy bibasilar changes stable from the prior exam. Electronically Signed   By: Inez Catalina M.D.   On: 12/07/2015 10:13   Dg Abd 1 View  12/07/2015  CLINICAL DATA:  Check nasogastric catheter placement EXAM: ABDOMEN - 1 VIEW COMPARISON:  12/03/2015 FINDINGS:  Nasogastric catheter is noted within the distal stomach. Scattered large and small bowel gas is noted. Mild persistent small bowel dilatation is noted. IMPRESSION: Nasogastric catheter within the stomach. Persistent small bowel dilatation is noted. Electronically Signed   By: Inez Catalina M.D.   On: 12/07/2015 10:14    Scheduled Meds:  Scheduled Meds: . antiseptic oral rinse  7 mL Mouth Rinse BID  . dextrose  1 ampule Intravenous Once  . heparin subcutaneous  5,000 Units Subcutaneous 3 times per day  . levETIRAcetam  750 mg Intravenous Q12H  . lip balm  1 application Topical BID  . neomycin-bacitracin-polymyxin   Topical Daily  . piperacillin-tazobactam (ZOSYN)  IV  3.375 g Intravenous Q8H   Continuous Infusions: . dextrose 150 mL/hr at 12/08/15 0123    Time spent on care of this patient: 69 min   ELGERGAWY, DAWOOD, MD [phone] 12/08/2015, 2:30 PM  LOS: 7 days   [facility] Office  [phone] Pager - Text Page per [email or web address] If 7PM-7AM, please contact night-coverage [email or web address]

## 2015-12-09 LAB — GLUCOSE, CAPILLARY
GLUCOSE-CAPILLARY: 124 mg/dL — AB (ref 65–99)
GLUCOSE-CAPILLARY: 109 mg/dL — AB (ref 65–99)
Glucose-Capillary: 109 mg/dL — ABNORMAL HIGH (ref 65–99)
Glucose-Capillary: 109 mg/dL — ABNORMAL HIGH (ref 65–99)
Glucose-Capillary: 124 mg/dL — ABNORMAL HIGH (ref 65–99)
Glucose-Capillary: 97 mg/dL (ref 65–99)

## 2015-12-09 LAB — BASIC METABOLIC PANEL
Anion gap: 11 (ref 5–15)
BUN: 11 mg/dL (ref 6–20)
CALCIUM: 7.3 mg/dL — AB (ref 8.9–10.3)
CO2: 23 mmol/L (ref 22–32)
Chloride: 105 mmol/L (ref 101–111)
Creatinine, Ser: 1.06 mg/dL (ref 0.61–1.24)
GFR calc Af Amer: >60 mL/min (ref >60)
GLUCOSE: 127 mg/dL — AB (ref 65–99)
Potassium: 3.2 mmol/L — ABNORMAL LOW (ref 3.5–5.1)
Sodium: 139 mmol/L (ref 135–145)

## 2015-12-09 LAB — CBC
HEMATOCRIT: 34.5 % — AB (ref 39.0–52.0)
Hemoglobin: 11.2 g/dL — ABNORMAL LOW (ref 13.0–17.0)
MCH: 34.8 pg — AB (ref 26.0–34.0)
MCHC: 32.5 g/dL (ref 30.0–36.0)
MCV: 107.1 fL — AB (ref 78.0–100.0)
PLATELETS: 283 10*3/uL (ref 150–400)
RBC: 3.22 MIL/uL — ABNORMAL LOW (ref 4.22–5.81)
RDW: 13.5 % (ref 11.5–15.5)
WBC: 8.8 10*3/uL (ref 4.0–10.5)

## 2015-12-09 LAB — MAGNESIUM: Magnesium: 1.7 mg/dL (ref 1.7–2.4)

## 2015-12-09 LAB — URINE CULTURE: CULTURE: NO GROWTH

## 2015-12-09 LAB — PROCALCITONIN: Procalcitonin: 0.4 ng/mL

## 2015-12-09 LAB — PHOSPHORUS: Phosphorus: 2.1 mg/dL — ABNORMAL LOW (ref 2.5–4.6)

## 2015-12-09 MED ORDER — MAGNESIUM SULFATE IN D5W 10-5 MG/ML-% IV SOLN
1.0000 g | Freq: Once | INTRAVENOUS | Status: AC
  Administered 2015-12-09: 1 g via INTRAVENOUS
  Filled 2015-12-09: qty 100

## 2015-12-09 MED ORDER — POTASSIUM CHLORIDE 10 MEQ/100ML IV SOLN: 10.0000 meq | INTRAVENOUS | Status: DC

## 2015-12-09 MED ORDER — DEXTROSE 5 % IV SOLN
INTRAVENOUS | Status: DC
  Administered 2015-12-09: 07:00:00 via INTRAVENOUS

## 2015-12-09 MED ORDER — DEXTROSE-NACL 5-0.45 % IV SOLN
INTRAVENOUS | Status: DC
  Administered 2015-12-09 – 2015-12-10 (×2): via INTRAVENOUS
  Administered 2015-12-10: 1000 mL via INTRAVENOUS
  Administered 2015-12-10 – 2015-12-13 (×3): via INTRAVENOUS

## 2015-12-09 MED ORDER — POTASSIUM CHLORIDE 10 MEQ/100ML IV SOLN
10.0000 meq | INTRAVENOUS | Status: AC
  Administered 2015-12-09 (×6): 10 meq via INTRAVENOUS
  Filled 2015-12-09 (×6): qty 100

## 2015-12-09 MED ORDER — POTASSIUM PHOSPHATES 15 MMOLE/5ML IV SOLN
30.0000 mmol | Freq: Once | INTRAVENOUS | Status: AC
  Administered 2015-12-09: 30 mmol via INTRAVENOUS
  Filled 2015-12-09: qty 10

## 2015-12-09 NOTE — Progress Notes (Signed)
[facility] Progress Note   [name]  BWG:[id number]  DOB: 1955-12-25  DOA: 12/01/2015 PCP: No PCP Per Patient  Brief narrative: [name] is a 60 y.o. male male with history of seizures, Crohn's disease, mild mental cognitive defect was brought to the ER after patient had multiple episodes of nausea & vomiting. CT abd and pelvis show high grade obstruction. NG tube placed in ER. Also noted to have ARF and severe lactic acidosis.  As obstruction did not resolve, he underwent Ex lap with bowel resection and colostomy. He appeared to be recovering well until 2/13 when he pulled out his NG tube and became slightly confused, developed fever, and aspiration pneumonia,   Subjective: No complaint of nausea, cough or dyspnea. Abdominal pan is being controlled with narcotics, still with significant NGT suction.  Assessment/Plan:    SIRS (systemic inflammatory response syndrome)- possible pneumonia?? - initially due to SBO- resolved after surgery -  UA shows bacteria Nitrite + and only 0-5 WBC, sputum culture with insignificant growth - Lactic acid and procalcitonin normal - Patient with an episode on 2/13 overnight. He pulled his His NG tube, since then having cough, fever, started empirically on IV Zosyn for aspiration pneumonia, continue to monitor closely.  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- S/p resection and colostomy - surgery assisting with managment - NPO/ NG tube  -management per surgery, remain with significant postoperative ileus, if no improvement will start on TPN in a.m. per surgical recommendation.  Dehydration  - on admission - improved with IV hydration  - cont IVF while NPO- cont to follow I and O carefully   Hypernatremia - Improving, will change fluids from D5W to D5 half-normal saline  Hypokalemia/hypophosphatemia - replace via IV as he is still NPO    Crohn's disease / small bowel fistula - on sulfasalazine at home- will resume once  able to take orals- GI following  Mild hypoglycemia - on admission - resolved    Seizure disorder - cont Keppra via IV until able to take orals    ARF (acute renal failure) ([place]) - likely due to dehydration? -renal function has normalized    Antibiotics: Anti-infectives    Start     Dose/Rate Route Frequency Ordered Stop   12/07/15 1500  piperacillin-tazobactam (ZOSYN) IVPB 3.375 g     3.375 g 12.5 mL/hr over 240 Minutes Intravenous Every 8 hours 12/07/15 1455     12/04/15 2000  vancomycin (VANCOCIN) 1,250 mg in sodium chloride 0.9 % 250 mL IVPB  Status:  Discontinued     1,250 mg 166.7 mL/hr over 90 Minutes Intravenous Every 12 hours 12/04/15 1615 12/05/15 0759   12/02/15 2000  vancomycin (VANCOCIN) IVPB 750 mg/150 ml premix  Status:  Discontinued     750 mg 150 mL/hr over 60 Minutes Intravenous Every 12 hours 12/02/15 0735 12/04/15 1614   12/02/15 1400  piperacillin-tazobactam (ZOSYN) IVPB 3.375 g  Status:  Discontinued     3.375 g 12.5 mL/hr over 240 Minutes Intravenous 3 times per day 12/02/15 0459 12/05/15 0759   12/02/15 0800  vancomycin (VANCOCIN) 2,000 mg in sodium chloride 0.9 % 500 mL IVPB     2,000 mg 250 mL/hr over 120 Minutes Intravenous  Once 12/02/15 0730 12/02/15 1043   12/02/15 0500  piperacillin-tazobactam (ZOSYN) IVPB 3.375 g     3.375 g 100 mL/hr over 30 Minutes Intravenous  Once 12/02/15 0457 12/02/15 0647     Code Status:  Code Status Orders        Start     Ordered   12/02/15 0131  Full code   Continuous     12/02/15 0130    Code Status History    Date Active Date Inactive Code Status Order ID Comments User Context   03/31/2014  5:33 PM 04/02/2014  7:04 PM Full Code [id number]  Janece Canterbury, MD Inpatient    Advance Directive Documentation        Most Recent Value   Type of Advance Directive  Healthcare Power of Attorney [sister [name] is POA (in [place])]   Pre-existing out of facility DNR order (yellow form or pink MOST form)     "MOST"  Form in Place?       Family Communication: None at bedside Disposition Plan: expect 2-3 day hospital stay at minimal DVT prophylaxis: Heparin Consultants: gen surgery, GI Procedures: none    Objective: Filed Weights   12/05/15 0900 12/05/15 1008 12/09/15 0525  Weight: 95.255 kg (210 lb) 95 kg (209 lb 7 oz) 97.5 kg (214 lb 15.2 oz)    Intake/Output Summary (Last 24 hours) at 12/09/15 1220 Last data filed at 12/09/15 0700  Gross per 24 hour  Intake    370 ml  Output   4000 ml  Net  -3630 ml     Vitals Filed Vitals:   12/08/15 0430 12/08/15 1355 12/08/15 2033 12/09/15 0525  BP: 155/72 134/50 148/56 152/53  Pulse: 94 88 90 83  Temp: 99.9 F (37.7 C) 100.4 F (38 C) 99.9 F (37.7 C) 98.3 F (36.8 C)  TempSrc: Axillary Oral Oral Oral  Resp: 20 18 20 20   Height:      Weight:    97.5 kg (214 lb 15.2 oz)  SpO2: 95% 97% 95% 90%    Exam:  General:  Pt is alert, not in acute distress  HEENT: No icterus, No thrush, oral mucosa moist  Cardiovascular: regular rate and rhythm, S1/S2 No murmur  Respiratory: clear to auscultation bilaterally , no wheezing  Abdomen: Soft, + Bowel sounds, non tender, NG tube draining brown fluid, colostomy +, midline small surgical wound with wound VAC,  non distended, no guarding  MSK: No cyanosis or clubbing- no pedal edema   Data Reviewed: Basic Metabolic Panel:  Recent Labs Lab 12/05/15 0346 12/06/15 0434 12/07/15 0820 12/08/15 0347 12/09/15 0355  NA 141 144 148* 146* 139  K 3.9 3.3* 3.3* 3.3* 3.2*  CL 112* 112* 116* 114* 105  CO2 21* 24 21* 25 23  GLUCOSE 136* 112* 152* 127* 127*  BUN 13 12 13 12 11   CREATININE 1.12 1.02 1.20 1.09 1.06  CALCIUM 7.4* 7.5* 7.6* 7.7* 7.3*  MG  --   --   --   --  1.7  PHOS  --   --   --   --  2.1*   Liver Function Tests:  Recent Labs Lab 12/08/15 0347  AST 15  ALT 11*  ALKPHOS 57  BILITOT 2.2*  PROT 5.2*  ALBUMIN 1.9*   No results for input(s): LIPASE, AMYLASE in the last 168  hours. No results for input(s): AMMONIA in the last 168 hours. CBC:  Recent Labs Lab 12/03/15 0540 12/04/15 0558 12/05/15 0346 12/07/15 0820 12/09/15 0355  WBC 7.1 6.9 6.2 7.9 8.8  NEUTROABS 6.1  --  4.5  --   --   HGB 13.2 13.0 13.4 11.9* 11.2*  HCT 40.9 40.2 39.3 36.8* 34.5*  MCV 111.4* 109.8* 106.8*  108.6* 107.1*  PLT 181 170 173 252 283   Cardiac Enzymes: No results for input(s): CKTOTAL, CKMB, CKMBINDEX, TROPONINI in the last 168 hours. BNP (last 3 results) No results for input(s): BNP in the last 8760 hours.  ProBNP (last 3 results) No results for input(s): PROBNP in the last 8760 hours.  CBG:  Recent Labs Lab 12/08/15 2025 12/09/15 0019 12/09/15 0528 12/09/15 0754 12/09/15 1141  GLUCAP 92 109* 124* 124* 109*    Recent Results (from the past 240 hour(s))  Culture, Urine     Status: None   Collection Time: 12/02/15  5:30 AM  Result Value Ref Range Status   Specimen Description URINE, RANDOM  Final   Special Requests NONE  Final   Culture   Final    5,000 COLONIES/mL INSIGNIFICANT GROWTH Performed at [facility]    Report Status 12/03/2015 FINAL  Final  Culture, blood (Routine X 2) w Reflex to ID Panel     Status: None   Collection Time: 12/02/15  5:48 AM  Result Value Ref Range Status   Specimen Description BLOOD RIGHT HAND  Final   Special Requests IN PEDIATRIC BOTTLE 1.5CC  Final   Culture   Final    NO GROWTH 5 DAYS Performed at [facility]    Report Status 12/07/2015 FINAL  Final  Culture, blood (Routine X 2) w Reflex to ID Panel     Status: None   Collection Time: 12/02/15  5:48 AM  Result Value Ref Range Status   Specimen Description BLOOD RIGHT FOREARM  Final   Special Requests IN PEDIATRIC BOTTLE [place]  Final   Culture   Final    NO GROWTH 5 DAYS Performed at [facility]    Report Status 12/07/2015 FINAL  Final  Culture, blood (Routine X 2) w Reflex to ID Panel     Status: None (Preliminary result)   Collection  Time: 12/07/15 10:43 AM  Result Value Ref Range Status   Specimen Description BLOOD RIGHT HAND  Final   Special Requests BOTTLES DRAWN AEROBIC AND ANAEROBIC [place]  Final   Culture   Final    NO GROWTH 2 DAYS Performed at [facility]    Report Status PENDING  Incomplete  Culture, blood (Routine X 2) w Reflex to ID Panel     Status: None (Preliminary result)   Collection Time: 12/07/15 10:45 AM  Result Value Ref Range Status   Specimen Description BLOOD LEFT HAND  Final   Special Requests BOTTLES DRAWN AEROBIC AND ANAEROBIC 10CC  Final   Culture   Final    NO GROWTH 2 DAYS Performed at [facility]    Report Status PENDING  Incomplete  Urine culture     Status: None   Collection Time: 12/07/15 12:25 PM  Result Value Ref Range Status   Specimen Description URINE, RANDOM  Final   Special Requests NONE  Final   Culture   Final    NO GROWTH 2 DAYS Performed at [facility]    Report Status 12/09/2015 FINAL  Final  MRSA PCR Screening     Status: None   Collection Time: 12/07/15  6:16 PM  Result Value Ref Range Status   MRSA by PCR NEGATIVE NEGATIVE Final    Comment:        The GeneXpert MRSA Assay (FDA approved for NASAL specimens only), is one component of a comprehensive MRSA colonization surveillance program. It is not intended to diagnose MRSA  infection nor to guide or monitor treatment for MRSA infections.      Studies: No results found.  Scheduled Meds:  Scheduled Meds: . antiseptic oral rinse  7 mL Mouth Rinse BID  . dextrose  1 ampule Intravenous Once  . heparin subcutaneous  5,000 Units Subcutaneous 3 times per day  . levETIRAcetam  750 mg Intravenous Q12H  . lip balm  1 application Topical BID  . neomycin-bacitracin-polymyxin   Topical Daily  . piperacillin-tazobactam (ZOSYN)  IV  3.375 g Intravenous Q8H  . potassium chloride  10 mEq Intravenous Q1 Hr x 6  . potassium phosphate IVPB (mmol)  30 mmol Intravenous Once   Continuous  Infusions: . dextrose 5 % and 0.45% NaCl 100 mL/hr at 12/09/15 0816    Time spent on care of this patient: 30 min   Felicie Kocher, [place] 12/09/2015, 12:20 PM  LOS: 8 days   [facility] Office  [phone] Pager - Text Page per [email or web address] If 7PM-7AM, please contact night-coverage [email or web address]

## 2015-12-09 NOTE — Progress Notes (Signed)
Progress Note: General Surgery Service   Subjective: No events overnight, no complaints from [facility]  Objective: Vital signs in last 24 hours: Temp:  [98.3 F (36.8 C)-100.4 F (38 C)] 98.3 F (36.8 C) (02/16 0525) Pulse Rate:  [83-90] 83 (02/16 0525) Resp:  [18-20] 20 (02/16 0525) BP: (134-152)/(50-56) 152/53 mmHg (02/16 0525) SpO2:  [90 %-97 %] 90 % (02/16 0525) Weight:  [97.5 kg (214 lb 15.2 oz)] 97.5 kg (214 lb 15.2 oz) (02/16 0525) Last BM Date: 12/08/15  Intake/Output from previous day: 02/15 0701 - 02/16 0700 In: 520 [IV Piggyback:400] Out: 4000 [Urine:2350; Emesis/NG output:1600; Stool:50] Intake/Output this shift:    Lungs: CTAB  Cardiovascular: RRR  Abd: soft, Nt, ND, ostomy patent and pink with minimal gas in bag. NG with bilious output, vac in place over midline incision  Extremities: no edema  Neuro: alert, comfortable  Lab Results: CBC   Recent Labs  12/07/15 0820 12/09/15 0355  WBC 7.9 8.8  HGB 11.9* 11.2*  HCT 36.8* 34.5*  PLT 252 283   BMET  Recent Labs  12/08/15 0347 12/09/15 0355  NA 146* 139  K 3.3* 3.2*  CL 114* 105  CO2 25 23  GLUCOSE 127* 127*  BUN 12 11  CREATININE 1.09 1.06  CALCIUM 7.7* 7.3*   PT/INR No results for input(s): LABPROT, INR in the last 72 hours. ABG No results for input(s): PHART, HCO3 in the last 72 hours.  Invalid input(s): PCO2, PO2  Studies/Results:  Anti-infectives: Anti-infectives    Start     Dose/Rate Route Frequency Ordered Stop   12/07/15 1500  piperacillin-tazobactam (ZOSYN) IVPB 3.375 g     3.375 g 12.5 mL/hr over 240 Minutes Intravenous Every 8 hours 12/07/15 1455     12/04/15 2000  vancomycin (VANCOCIN) 1,250 mg in sodium chloride 0.9 % 250 mL IVPB  Status:  Discontinued     1,250 mg 166.7 mL/hr over 90 Minutes Intravenous Every 12 hours 12/04/15 1615 12/05/15 0759   12/02/15 2000  vancomycin (VANCOCIN) IVPB 750 mg/150 ml premix  Status:  Discontinued     750 mg 150 mL/hr over 60  Minutes Intravenous Every 12 hours 12/02/15 0735 12/04/15 1614   12/02/15 1400  piperacillin-tazobactam (ZOSYN) IVPB 3.375 g  Status:  Discontinued     3.375 g 12.5 mL/hr over 240 Minutes Intravenous 3 times per day 12/02/15 0459 12/05/15 0759   12/02/15 0800  vancomycin (VANCOCIN) 2,000 mg in sodium chloride 0.9 % 500 mL IVPB     2,000 mg 250 mL/hr over 120 Minutes Intravenous  Once 12/02/15 0730 12/02/15 1043   12/02/15 0500  piperacillin-tazobactam (ZOSYN) IVPB 3.375 g     3.375 g 100 mL/hr over 30 Minutes Intravenous  Once 12/02/15 0457 12/02/15 0647      Medications: Scheduled Meds: . antiseptic oral rinse  7 mL Mouth Rinse BID  . dextrose  1 ampule Intravenous Once  . heparin subcutaneous  5,000 Units Subcutaneous 3 times per day  . levETIRAcetam  750 mg Intravenous Q12H  . lip balm  1 application Topical BID  . magnesium sulfate 1 - 4 g bolus IVPB  1 g Intravenous Once  . neomycin-bacitracin-polymyxin   Topical Daily  . piperacillin-tazobactam (ZOSYN)  IV  3.375 g Intravenous Q8H  . potassium chloride  10 mEq Intravenous Q1 Hr x 6  . potassium chloride  10 mEq Intravenous Q1 Hr x 4  . potassium phosphate IVPB (mmol)  30 mmol Intravenous Once   Continuous Infusions: .  dextrose 5 % and 0.45% NaCl 100 mL/hr at 12/09/15 0816   PRN Meds:.acetaminophen, acetaminophen **OR** acetaminophen, alum & mag hydroxide-simeth, bisacodyl, diphenhydrAMINE, magic mouthwash, menthol-cetylpyridinium, morphine injection, ondansetron (ZOFRAN) IV **OR** ondansetron (ZOFRAN) IV, ondansetron **OR** ondansetron (ZOFRAN) IV, ondansetron, phenol, phenol, promethazine  Assessment/Plan: Patient Active Problem List   Diagnosis Date Noted  . Crohn's disease of both small and large intestine with fistula ([place])   . SBO (small bowel obstruction) ([place]) 12/01/2015  . S/P partial colectomy for Crohn's disease 12/01/2015  . ARF (acute renal failure) ([place]) 12/01/2015  . Shock ([place]) 12/01/2015  . SIRS (systemic  inflammatory response syndrome) ([place]) 12/01/2015  . Localization-related idiopathic epilepsy and epileptic syndromes with seizures of localized onset, not intractable, without status epilepticus ([place]) 03/30/2015  . Aortic valve stenosis, mild 03/31/2014  . Aortic valve regurgitation 03/31/2014  . Pseudopolyposis of colon ([place]) 01/03/2012  . Crohn's disease ([place]) 12/19/2011  . Personal history of immunosupression therapy 12/19/2011  . Seizure disorder ([place]) 12/19/2011  . ECZEMA 08/21/2008  . CHOLELITHIASIS, ASYMPTOMATIC 01/29/2008  . VITAMIN B12 DEFICIENCY 01/17/2008   s/p Procedure(s): LAPAROSCOPY DIAGNOSTIC, LAPAROSCOPIC INTEROLYSIS OF ADHESIONS, HAND- ASSISTED SMALL BOWEL RESECTION, MOBILIZATION OF SPLENIC FLEXURE, LEFT COLECTOMY WITH CREATION OF COLOSTOMY (HARTMAN'S PROCEDURE) 12/04/2015  -continued postoperative ileus, if no improvement will pursue TPN tomorrow -continue pain control/ambulate with assisstance -continue abx for UTI   LOS: 8 days   Mickeal Skinner, MD Pg# [phone] [facility]

## 2015-12-09 NOTE — Progress Notes (Signed)
Patient ID: [name], male   DOB: 10/31/1955, 60 y.o.   MRN: [id number]    Call for problems Pt has Follow up appt with Dr Hilarie Fredrickson on 02/07/2016 at 8 :33 am

## 2015-12-10 DIAGNOSIS — R6510 Systemic inflammatory response syndrome (SIRS) of non-infectious origin without acute organ dysfunction: Secondary | ICD-10-CM

## 2015-12-10 DIAGNOSIS — G40909 Epilepsy, unspecified, not intractable, without status epilepticus: Secondary | ICD-10-CM

## 2015-12-10 LAB — GLUCOSE, CAPILLARY
GLUCOSE-CAPILLARY: 103 mg/dL — AB (ref 65–99)
GLUCOSE-CAPILLARY: 133 mg/dL — AB (ref 65–99)
Glucose-Capillary: 118 mg/dL — ABNORMAL HIGH (ref 65–99)
Glucose-Capillary: 109 mg/dL — ABNORMAL HIGH (ref 65–99)
Glucose-Capillary: 116 mg/dL — ABNORMAL HIGH (ref 65–99)
Glucose-Capillary: 117 mg/dL — ABNORMAL HIGH (ref 65–99)

## 2015-12-10 LAB — BASIC METABOLIC PANEL
Anion gap: 10 (ref 5–15)
BUN: 9 mg/dL (ref 6–20)
CHLORIDE: 105 mmol/L (ref 101–111)
CO2: 25 mmol/L (ref 22–32)
Calcium: 7.5 mg/dL — ABNORMAL LOW (ref 8.9–10.3)
Creatinine, Ser: 0.84 mg/dL (ref 0.61–1.24)
GFR calc Af Amer: >60 mL/min (ref >60)
GFR calc non Af Amer: >60 mL/min (ref >60)
GLUCOSE: 126 mg/dL — AB (ref 65–99)
POTASSIUM: 3.7 mmol/L (ref 3.5–5.1)
Sodium: 140 mmol/L (ref 135–145)

## 2015-12-10 NOTE — Progress Notes (Signed)
CSW continuing to follow.   CSW received update from RN that pt NG tube clamped and pt starting on clears.   CSW updated FL2 and initiated SNF search to [facility].   CSW contacted pt sister, [name] via telephone and updated pt sister that CSW initiated SNF search to start process of seeing which facilities will be able to meet pt needs. Pt sister appreciative of update.  CSW to follow up with pt sister re: SNF bed offers.  CSW to continue to follow.  Alison Murray, MSW, [place] [employer] [phone]

## 2015-12-10 NOTE — NC FL2 (Signed)
[place] LEVEL OF CARE SCREENING TOOL     IDENTIFICATION  Patient Name: VALGENE DELOATCH Birthdate: [birth year] Sex: male Admission Date (Current Location): 12/01/2015  [facility] and [place] Number:  Herbalist and Address:  [facility],  [place] [address] [place]      Provider Number: [id number]  Attending Physician Name and Address:  Albertine Patricia, MD  Relative Name and Phone Number:       Current Level of Care: Hospital Recommended Level of Care: [place] Prior Approval Number:    Date Approved/Denied:   PASRR Number: [id number]  Discharge Plan: SNF    Current Diagnoses: Patient Active Problem List   Diagnosis Date Noted  . Crohn's disease of both small and large intestine with fistula ([place])   . SBO (small bowel obstruction) ([place]) 12/01/2015  . S/P partial colectomy for Crohn's disease 12/01/2015  . ARF (acute renal failure) ([place]) 12/01/2015  . Shock ([place]) 12/01/2015  . SIRS (systemic inflammatory response syndrome) ([place]) 12/01/2015  . Localization-related idiopathic epilepsy and epileptic syndromes with seizures of localized onset, not intractable, without status epilepticus ([place]) 03/30/2015  . Aortic valve stenosis, mild 03/31/2014  . Aortic valve regurgitation 03/31/2014  . Pseudopolyposis of colon ([place]) 01/03/2012  . Crohn's disease ([place]) 12/19/2011  . Personal history of immunosupression therapy 12/19/2011  . Seizure disorder ([place]) 12/19/2011  . ECZEMA 08/21/2008  . CHOLELITHIASIS, ASYMPTOMATIC 01/29/2008  . VITAMIN B12 DEFICIENCY 01/17/2008    Orientation RESPIRATION BLADDER Height & Weight     Self, Time, Situation, Place  Normal External catheter Weight: 214 lb 15.2 oz (97.5 kg) Height:  5' 10"  (177.8 cm)  BEHAVIORAL SYMPTOMS/MOOD NEUROLOGICAL BOWEL NUTRITION STATUS   (n/a) Convulsions/Seizures (seizure disorder, on Keppra) Colostomy (LLQ, end colostomy, see WOC note) Diet (Diet Clear  Liquid)  AMBULATORY STATUS COMMUNICATION OF NEEDS Skin   Limited Assist Verbally Wound Vac (125 mm/Hg continuous CHANGE VAC M/W/F)                       Personal Care Assistance Level of Assistance  Bathing, Dressing, Feeding Bathing Assistance: Limited assistance Feeding assistance: Limited assistance (supervision) Dressing Assistance: Limited assistance     Functional Limitations Info  Sight Sight Info: Impaired        SPECIAL CARE FACTORS FREQUENCY  PT (By licensed PT), OT (By licensed OT)     PT Frequency: 5 x a week OT Frequency: 5 x a week            Contractures Contractures Info: Not present    Additional Factors Info  Code Status Code Status Info: FULL code status Allergies Info: No Known Allergies           Current Medications (12/10/2015):  This is the current hospital active medication list Current Facility-Administered Medications  Medication Dose Route Frequency Provider Last Rate Last Dose  . acetaminophen (TYLENOL) suppository 650 mg  650 mg Rectal Q6H PRN Michael Boston, MD      . acetaminophen (TYLENOL) tablet 650 mg  650 mg Oral Q6H PRN Rise Patience, MD   650 mg at 12/05/15 5462   Or  . acetaminophen (TYLENOL) suppository 650 mg  650 mg Rectal Q6H PRN Rise Patience, MD      . alum & mag hydroxide-simeth (MAALOX/MYLANTA) 200-200-20 MG/5ML suspension 30 mL  30 mL Oral Q6H PRN Michael Boston, MD      . antiseptic oral rinse (CPC / CETYLPYRIDINIUM  CHLORIDE 0.05%) solution 7 mL  7 mL Mouth Rinse BID Debbe Odea, MD   7 mL at 12/09/15 2207  . bisacodyl (DULCOLAX) suppository 10 mg  10 mg Rectal Q12H PRN Michael Boston, MD      . dextrose 5 %-0.45 % sodium chloride infusion   Intravenous Continuous Albertine Patricia, MD 100 mL/hr at 12/10/15 1337    . dextrose 50 % solution 50 mL  1 ampule Intravenous Once Debbe Odea, MD   50 mL at 12/02/15 0815  . diphenhydrAMINE (BENADRYL) injection 12.5-25 mg  12.5-25 mg Intravenous Q6H PRN Michael Boston, MD   12.5 mg at 12/04/15 0022  . heparin injection 5,000 Units  5,000 Units Subcutaneous 3 times per day Debbe Odea, MD   5,000 Units at 12/10/15 1342  . levETIRAcetam (KEPPRA) 750 mg in sodium chloride 0.9 % 100 mL IVPB  750 mg Intravenous Q12H Rise Patience, MD   750 mg at 12/10/15 1048  . lip balm (CARMEX) ointment 1 application  1 application Topical BID Michael Boston, MD   1 application at 95/18/84 2207  . magic mouthwash  15 mL Oral QID PRN Michael Boston, MD      . menthol-cetylpyridinium (CEPACOL) lozenge 3 mg  1 lozenge Oral PRN Michael Boston, MD      . morphine 2 MG/ML injection 1-3 mg  1-3 mg Intravenous Q1H PRN Alphonsa Overall, MD   2 mg at 12/10/15 0753  . neomycin-bacitracin-polymyxin (NEOSPORIN) ointment   Topical Daily Debbe Odea, MD      . ondansetron (ZOFRAN) injection 4 mg  4 mg Intravenous Q6H PRN Michael Boston, MD       Or  . ondansetron (ZOFRAN) 8 mg in sodium chloride 0.9 % 50 mL IVPB  8 mg Intravenous Q6H PRN Michael Boston, MD      . ondansetron [facility]) tablet 4 mg  4 mg Oral Q6H PRN Rise Patience, MD       Or  . ondansetron [facility]) injection 4 mg  4 mg Intravenous Q6H PRN Rise Patience, MD      . ondansetron [facility]) injection 4 mg  4 mg Intravenous Q6H PRN Alphonsa Overall, MD      . phenol (CHLORASEPTIC) mouth spray 1 spray  1 spray Mouth/Throat PRN Debbe Odea, MD   1 spray at 12/03/15 1721  . phenol (CHLORASEPTIC) mouth spray 2 spray  2 spray Mouth/Throat PRN Michael Boston, MD   2 spray at 12/02/15 1418  . piperacillin-tazobactam (ZOSYN) IVPB 3.375 g  3.375 g Intravenous Q8H Leann T Poindexter, RPH   3.375 g at 12/10/15 1626  . promethazine (PHENERGAN) injection 6.25-12.5 mg  6.25-12.5 mg Intravenous Q4H PRN Michael Boston, MD         Discharge Medications: Please see discharge summary for a list of discharge medications.  Relevant Imaging Results:  Relevant Lab Results:   Additional Information SSN [id number]. See [place] notes for further  information re: LLQ colostomy and Wound VAC. Pt has NG tube due to SBO that is clamped and clears started on 2/17. NG tube will be removed prior to discharge.  Arnulfo Batson A, LCSW

## 2015-12-10 NOTE — Progress Notes (Signed)
Pharmacy Antibiotic Note  [name] is a 60 y.o. male admitted on 2/8 with sepsis secondary to SBO, started on empiric broad spectrum antibiotics and now s/p bowel resection on 2/11. Pharmacy was consulted to dose vancomycin and zosyn, which were d/c'ed on 2/12.  Zosyn restarted on 2/14 due to new onset fevers and cough and suspicion for aspiration PNA.    2/17:  Cultures all negative.  SCr improved, CrCl ~100 ml/min  Plan: Continue Zosyn 3.375g IV q8h (infuse over 4 hours)   Height: 5' 10"  (177.8 cm) Weight: 214 lb 15.2 oz (97.5 kg) IBW/kg (Calculated) : 73  Temp (24hrs), Avg:98.3 F (36.8 C), Min:98.1 F (36.7 C), Max:98.5 F (36.9 C)   Recent Labs Lab 12/04/15 0558 12/05/15 0346 12/06/15 0434 12/07/15 0404 12/07/15 0820 12/07/15 1043 12/08/15 0347 12/09/15 0355 12/10/15 0336  WBC 6.9 6.2  --   --  7.9  --   --  8.8  --   CREATININE 0.97 1.12 1.02  --  1.20  --  1.09 1.06 0.84  LATICACIDVEN  --   --   --  0.9  --  1.0  --   --   --     Estimated Creatinine Clearance: 108.2 mL/min (by C-G formula based on Cr of 0.84).    No Known Allergies  Antimicrobials this admission: 2/8 >>zosyn  >> 2/12>> resume 2/14>> 2/9 >> vancomycin >>  2/12  Dose adjustments this admission: 2/11: Vancomycin 745m q12h--> 12587mq12h for improved renal fxn  Microbiology results: 2/9 Bcx x2: neg FINAL 2/9 Urine: 5K insignificant growth (final) 2/14 Bcx x2: NGTD 2/14 Urine : NGF 2/14 MRSA PCR (-)  Thank you for allowing pharmacy to be a part of this patient's care.  CoRalene BathePharmD, BCPS 12/10/2015, 8:14 AM  Pager: [phone]

## 2015-12-10 NOTE — Care Management Important Message (Signed)
Important Message  Patient Details  Name: [name] MRN: [id number] Date of Birth: [birth year]   Medicare Important Message Given:  Yes    [name] 12/10/2015, 12:40 [place] Message  Patient Details  Name: [name] MRN: [id number] Date of Birth: 18-Jul-1956   Medicare Important Message Given:  Yes    [name] 12/10/2015, 12:40 PM

## 2015-12-10 NOTE — Progress Notes (Signed)
[facility] Progress Note   [name]  QQV:[id number]  DOB: [birth year]  DOA: 12/01/2015 PCP: No PCP Per Patient  Brief narrative: [name] is a 60 y.o. male male with history of seizures, Crohn's disease, mild mental cognitive defect was brought to the ER after patient had multiple episodes of nausea & vomiting. CT abd and pelvis show high grade obstruction. NG tube placed in ER. Also noted to have ARF and severe lactic acidosis.  As obstruction did not resolve, he underwent Ex lap with bowel resection and colostomy. He appeared to be recovering well until 2/13 when he pulled out his NG tube and became slightly confused, developed fever, and aspiration pneumonia,   Subjective: No complaint of nausea, cough or dyspnea. Abdominal pan is being controlled with narcotics, still with significant NGT suction.  Assessment/Plan:    SIRS (systemic inflammatory response syndrome)- possible pneumonia?? - initially due to SBO- resolved after surgery -  UA shows bacteria Nitrite + and only 0-5 WBC, sputum culture with insignificant growth - Lactic acid and procalcitonin normal - Patient with an episode on 2/13 overnight. He pulled his His NG tube, since then having cough, fever, started empirically on IV Zosyn for aspiration pneumonia. - Afebrile over last 48 hours  Aspiration pneumonia - Treated with IV Zosyn, will discontinue 24 hours if continues to improve.  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  Dehydration  - on admission - improved with IV hydration  - cont IVF while NPO- cont to follow I and O carefully   Hypernatremia - Improving, will change fluids from D5W to D5 half-normal  saline  Hypokalemia/hypophosphatemia - replace via IV as he is still NPO    Crohn's disease / small bowel fistula - on sulfasalazine at home- will resume once able to take orals- GI following  Mild hypoglycemia - on admission - resolved    Seizure disorder - cont Keppra via IV until able to take orals    ARF (acute renal failure) ([place]) - likely due to dehydration? -renal function has normalized   hypokalemia/hypophosphatemia - Repleted  Antibiotics: Anti-infectives    Start     Dose/Rate Route Frequency Ordered Stop   12/07/15 1500  piperacillin-tazobactam (ZOSYN) IVPB 3.375 g     3.375 g 12.5 mL/hr over 240 Minutes Intravenous Every 8 hours 12/07/15 1455     12/04/15 2000  vancomycin (VANCOCIN) 1,250 mg in sodium chloride 0.9 % 250 mL IVPB  Status:  Discontinued     1,250 mg 166.7 mL/hr over 90 Minutes Intravenous Every 12 hours 12/04/15 1615 12/05/15 0759   12/02/15 2000  vancomycin (VANCOCIN) IVPB 750 mg/150 ml premix  Status:  Discontinued     750 mg 150 mL/hr over 60 Minutes Intravenous Every 12 hours 12/02/15 0735 12/04/15 1614   12/02/15 1400  piperacillin-tazobactam (ZOSYN) IVPB 3.375 g  Status:  Discontinued     3.375 g 12.5 mL/hr over 240 Minutes Intravenous 3 times per day 12/02/15 0459 12/05/15 0759   12/02/15 0800  vancomycin (VANCOCIN) 2,000 mg in sodium chloride 0.9 % 500 mL IVPB     2,000 mg 250 mL/hr over 120 Minutes Intravenous  Once 12/02/15 0730 12/02/15 1043   12/02/15 0500  piperacillin-tazobactam (ZOSYN) IVPB 3.375 g  3.375 g 100 mL/hr over 30 Minutes Intravenous  Once 12/02/15 0457 12/02/15 0647     Code Status:     Code Status Orders        Start     Ordered   12/02/15 0131  Full code   Continuous     12/02/15 0130    Code Status History    Date Active Date Inactive Code Status Order ID Comments User Context   03/31/2014  5:33 PM 04/02/2014  7:04 PM Full Code [id number]  Janece Canterbury, MD Inpatient    Advance Directive Documentation         Most Recent Value   Type of Advance Directive  Healthcare Power of Attorney [sister [name] is POA (in [place])]   Pre-existing out of facility DNR order (yellow form or pink MOST form)     "MOST" Form in Place?       Family Communication: None at bedside Disposition Plan: expect 2-3 day hospital stay at minimal DVT prophylaxis: Heparin Consultants: gen surgery, GI Procedures: none    Objective: Filed Weights   12/05/15 0900 12/05/15 1008 12/09/15 0525  Weight: 95.255 kg (210 lb) 95 kg (209 lb 7 oz) 97.5 kg (214 lb 15.2 oz)    Intake/Output Summary (Last 24 hours) at 12/10/15 1537 Last data filed at 12/10/15 1350  Gross per 24 hour  Intake 1018.33 ml  Output   1601 ml  Net -582.67 ml     Vitals Filed Vitals:   12/09/15 1503 12/09/15 2020 12/10/15 0610 12/10/15 1404  BP: 129/68 133/54 133/57 125/66  Pulse: 82 79 88 76  Temp: 98.1 F (36.7 C) 98.5 F (36.9 C) 98.2 F (36.8 C) 97.4 F (36.3 C)  TempSrc: Oral Oral Oral Oral  Resp: 20 22 20 20   Height:      Weight:      SpO2: 94% 97% 97% 95%    Exam:  General:  Pt is alert, not in acute distress  HEENT: No icterus, No thrush, oral mucosa moist  Cardiovascular: regular rate and rhythm, S1/S2 No murmur  Respiratory: clear to auscultation bilaterally , no wheezing  Abdomen: Soft, + Bowel sounds, non tender, NG tube draining brown fluid, colostomy +, midline small surgical wound with wound VAC,  non distended, no guarding  MSK: No cyanosis or clubbing- no pedal edema   Data Reviewed: Basic Metabolic Panel:  Recent Labs Lab 12/06/15 0434 12/07/15 0820 12/08/15 0347 12/09/15 0355 12/10/15 0336  NA 144 148* 146* 139 140  K 3.3* 3.3* 3.3* 3.2* 3.7  CL 112* 116* 114* 105 105  CO2 24 21* 25 23 25   GLUCOSE 112* 152* 127* 127* 126*  BUN 12 13 12 11 9   CREATININE 1.02 1.20 1.09 1.06 0.84  CALCIUM 7.5* 7.6* 7.7* 7.3* 7.5*  MG  --   --   --  1.7  --   PHOS  --   --   --  2.1*  --    Liver Function  Tests:  Recent Labs Lab 12/08/15 0347  AST 15  ALT 11*  ALKPHOS 57  BILITOT 2.2*  PROT 5.2*  ALBUMIN 1.9*   No results for input(s): LIPASE, AMYLASE in the last 168 hours. No results for input(s): AMMONIA in the last 168 hours. CBC:  Recent Labs Lab 12/04/15 0558 12/05/15 0346 12/07/15 0820 12/09/15 0355  WBC 6.9 6.2 7.9 8.8  NEUTROABS  --  4.5  --   --   HGB 13.0 13.4 11.9* 11.2*  HCT  40.2 39.3 36.8* 34.5*  MCV 109.8* 106.8* 108.6* 107.1*  PLT 170 173 252 283   Cardiac Enzymes: No results for input(s): CKTOTAL, CKMB, CKMBINDEX, TROPONINI in the last 168 hours. BNP (last 3 results) No results for input(s): BNP in the last 8760 hours.  ProBNP (last 3 results) No results for input(s): PROBNP in the last 8760 hours.  CBG:  Recent Labs Lab 12/09/15 2205 12/10/15 0202 12/10/15 0605 12/10/15 0736 12/10/15 1220  GLUCAP 97 103* 118* 133* 109*    Recent Results (from the past 240 hour(s))  Culture, Urine     Status: None   Collection Time: 12/02/15  5:30 AM  Result Value Ref Range Status   Specimen Description URINE, RANDOM  Final   Special Requests NONE  Final   Culture   Final    5,000 COLONIES/mL INSIGNIFICANT GROWTH Performed at [facility]    Report Status 12/03/2015 FINAL  Final  Culture, blood (Routine X 2) w Reflex to ID Panel     Status: None   Collection Time: 12/02/15  5:48 AM  Result Value Ref Range Status   Specimen Description BLOOD RIGHT HAND  Final   Special Requests IN PEDIATRIC BOTTLE 1.5CC  Final   Culture   Final    NO GROWTH 5 DAYS Performed at [facility]    Report Status 12/07/2015 FINAL  Final  Culture, blood (Routine X 2) w Reflex to ID Panel     Status: None   Collection Time: 12/02/15  5:48 AM  Result Value Ref Range Status   Specimen Description BLOOD RIGHT FOREARM  Final   Special Requests IN PEDIATRIC BOTTLE [place]  Final   Culture   Final    NO GROWTH 5 DAYS Performed at [facility]    Report  Status 12/07/2015 FINAL  Final  Culture, blood (Routine X 2) w Reflex to ID Panel     Status: None (Preliminary result)   Collection Time: 12/07/15 10:43 AM  Result Value Ref Range Status   Specimen Description BLOOD RIGHT HAND  Final   Special Requests BOTTLES DRAWN AEROBIC AND ANAEROBIC [place]  Final   Culture   Final    NO GROWTH 2 DAYS Performed at [facility]    Report Status PENDING  Incomplete  Culture, blood (Routine X 2) w Reflex to ID Panel     Status: None (Preliminary result)   Collection Time: 12/07/15 10:45 AM  Result Value Ref Range Status   Specimen Description BLOOD LEFT HAND  Final   Special Requests BOTTLES DRAWN AEROBIC AND ANAEROBIC 10CC  Final   Culture   Final    NO GROWTH 2 DAYS Performed at [facility]    Report Status PENDING  Incomplete  Urine culture     Status: None   Collection Time: 12/07/15 12:25 PM  Result Value Ref Range Status   Specimen Description URINE, RANDOM  Final   Special Requests NONE  Final   Culture   Final    NO GROWTH 2 DAYS Performed at [facility]    Report Status 12/09/2015 FINAL  Final  MRSA PCR Screening     Status: None   Collection Time: 12/07/15  6:16 PM  Result Value Ref Range Status   MRSA by PCR NEGATIVE NEGATIVE Final    Comment:        The GeneXpert MRSA Assay (FDA approved for NASAL specimens only), is one component of a comprehensive MRSA colonization surveillance program.  It is not intended to diagnose MRSA infection nor to guide or monitor treatment for MRSA infections.      Studies: No results found.  Scheduled Meds:  Scheduled Meds: . antiseptic oral rinse  7 mL Mouth Rinse BID  . dextrose  1 ampule Intravenous Once  . heparin subcutaneous  5,000 Units Subcutaneous 3 times per day  . levETIRAcetam  750 mg Intravenous Q12H  . lip balm  1 application Topical BID  . neomycin-bacitracin-polymyxin   Topical Daily  . piperacillin-tazobactam (ZOSYN)  IV  3.375 g Intravenous Q8H    Continuous Infusions: . dextrose 5 % and 0.45% NaCl 100 mL/hr at 12/10/15 1337    Time spent on care of this patient: 30 min   ELGERGAWY, DAWOOD, MD [phone] 12/10/2015, 3:37 PM  LOS: 9 days   [facility] Office  [phone] Pager - Text Page per [email or web address] If 7PM-7AM, please contact night-coverage [email or web address]

## 2015-12-10 NOTE — Progress Notes (Signed)
Physical Therapy Treatment Patient Details Name: [name] MRN: [id number] DOB: 1956-08-21 Today's Date: 12/10/2015    History of Present Illness Laparoscopic enterolysis of adhesions (45 minutes), laparoscopic-assisted small bowel resection, hand assisted left hemicolectomy with mobilization of splenic flexure, and left transverse colon end colostomy     PT Comments    Pt snoring loudly upon entering room, but woke up to voice and was able to participate with exercise, mobility to sit on edge of bed, stand and take some steps toward head of bed.  He tired easily and was not able to progress to trying again. He initiated return to supine and fell asleep again.  Nurse, family in room and aware. Anticipate he will benefit from continued PT treatment    Follow Up Recommendations  SNF;Supervision/Assistance - 24 hour     Equipment Recommendations  Rolling walker with 5" wheels    Recommendations for Other Services       Precautions / Restrictions Precautions Precautions: Fall Precaution Comments: colostomy, open ab wound,mentally challenged, from ALF, NG suction    Mobility  Bed Mobility                  Transfers Overall transfer level: Needs assistance Equipment used: Rolling walker (2 wheeled) Transfers: Sit to/from Stand Sit to Stand: Mod assist;+2 physical assistance         General transfer comment: needs assist by pulling up on lift pad   Ambulation/Gait Ambulation/Gait assistance: Mod assist Ambulation Distance (Feet): 2 Feet (laterall up to head of bed ) Assistive device: Rolling walker (2 wheeled) Gait Pattern/deviations: Step-to pattern Gait velocity: slow   General Gait Details: verbal and tactile cues to side step    Stairs            Wheelchair Mobility    Modified Rankin (Stroke Patients Only)       Balance Overall balance assessment: Needs assistance Sitting-balance support: Bilateral upper extremity supported Sitting  balance-Leahy Scale: Good Sitting balance - Comments: pt appeared to tire after several minutes    Standing balance support: Bilateral upper extremity supported Standing balance-Leahy Scale: Good Standing balance comment: using rolling walker                     Cognition Arousal/Alertness: Lethargic Behavior During Therapy: WFL for tasks assessed/performed (with verbal stimulation) Overall Cognitive Status: History of cognitive impairments - at baseline                      Exercises Other Exercises Other Exercises: AAROM to UEs and LEs x 5 reps with encouragement for patient participation to keep him awake     General Comments General comments (skin integrity, edema, etc.): pt with ng tube, condom cath, abdominal wound vac and colostomy Has mitts on hands as he has been pulling on tubes. cousin and husband in room       Pertinent Vitals/Pain Pain Assessment: Faces Pain Score: 0-No pain    Home Living                      Prior Function            PT Goals (current goals can now be found in the care plan section)      Frequency  Min 3X/week    PT Plan Current plan remains appropriate    Co-evaluation             End of Session  Activity Tolerance: Patient limited by lethargy Patient left: in bed;with bed alarm set;with family/visitor present (pt immedicately back to snoring upon lying back down )     Time: 1434-1500 PT Time Calculation (min) (ACUTE ONLY): 26 min  Charges:  $Therapeutic Exercise: 8-22 mins $Therapeutic Activity: 8-22 mins                    G Codes:     Tacy Chavis K. Owens Shark, PT  12/10/2015, 3:51 PM

## 2015-12-10 NOTE — Progress Notes (Signed)
Nutrition Follow-up  DOCUMENTATION CODES:   Obesity unspecified  INTERVENTION:  -RD continue to monitor for needs -Diet advancement per MD  NUTRITION DIAGNOSIS:   Inadequate oral intake related to poor appetite, lethargy/confusion as evidenced by per patient/family report.  New diagnosis  GOAL:   Patient will meet greater than or equal to 90% of their needs  Not yet meeting  MONITOR:   Diet advancement, Weight trends, Labs, I & O's  REASON FOR ASSESSMENT:   Malnutrition Screening Tool    ASSESSMENT:   60 y.o. male with history of seizures, Crohn's disease, mild mental cognitive defect was brought to the ER after patient had multiple episodes of nausea vomiting. As per patient patient had a normal bowel movement 24 hours ago following which patient started developing abdominal pain started off in the lower abdomen. Following which patient had multiple episodes of nausea and vomiting. Patient presented later today to the ER. CT abdomen and pelvis shows high-grade small bowel obstruction. On-call surgeon Dr. Johney Maine was consulted and patient had NG tube placed with at least 500 mL of dark fluid being suctioned. In addition patient has not making much urine since morning. Creatinine is elevated. Patient is also in the low blood pressure range with tachycardia for which patient has been given 3 L of fluid.  Went to visit pt at bedside; nurse and MD were having trouble waking him up. Pt was advanced to CLD today. He had one tray sitting in his room during time of visit, did not look like it had been touched. No PO intake not in chart at this time.  He is still having some abdominal pain. NGT did not produce any suction today, has been clamped at this time.  Continue to follow for PO intake.  Labs: CBGs 103-133, Phos 2.1, K & Mg WNL Medications: D5 drip,   Diet Order:  Diet clear liquid Room service appropriate?: Yes; Fluid consistency:: Thin  Skin:  Wound (see comment)  (abdominal incision)  Last BM:  2/15  Height:   Ht Readings from Last 1 Encounters:  12/05/15 5' 10"  (1.778 m)    Weight:   Wt Readings from Last 1 Encounters:  12/09/15 214 lb 15.2 oz (97.5 kg)    Ideal Body Weight:  75.45 kg (kg)  BMI:  Body mass index is 30.84 kg/(m^2).  Estimated Nutritional Needs:   Kcal:  1950-2150  Protein:  90-100g  Fluid:  2 L/day  EDUCATION NEEDS:   No education needs identified at this time  [name]. Jonathan Arrey, MS, RD LDN After Hours/Weekend Pager [phone]

## 2015-12-10 NOTE — Progress Notes (Signed)
Patient ID: [name], male   DOB: October 18, 1956, 60 y.o.   MRN: [id number]     [address] [place], [place] [address]    Phone: [phone] FAX: [phone]     Subjective: Afebrile.  VSS.  VAC malfunctioning.  I placed a wet to dry dressing No NGT output recorded overnight.   Objective:  Vital signs:  Filed Vitals:   12/09/15 0525 12/09/15 1503 12/09/15 2020 12/10/15 0610  BP: 152/53 129/68 133/54 133/57  Pulse: 83 82 79 88  Temp: 98.3 F (36.8 C) 98.1 F (36.7 C) 98.5 F (36.9 C) 98.2 F (36.8 C)  TempSrc: Oral Oral Oral Oral  Resp: _0 Height:      Weight: 97.5 kg (214 lb 15.2 oz)     SpO2: 90% 94% 97% 97%    Last BM Date: 12/08/15  Intake/Output   Yesterday:  02/16 0701 - 02/17 0700 In: -  Out: 800 [Urine:800] This shift:  Total I/O In: 240 [Other:120; NG/GT:120] Out: -    Physical Exam: General: Pt awake/alert/oriented to person. No acute distress  Abdomen: Soft. Nondistended. Mildly tender at incisions only. Wound is clean, fascia is intact. No evidence of peritonitis. Ostomy superficial dark, but viable underneath. Air and liquid stool.    Problem List:   Principal Problem:   SBO (small bowel obstruction) (H[facility]) Active Problems:   Crohn's disease ([place])   Personal history of immunosupression therapy   Seizure disorder ([place])   S/P partial colectomy for Crohn's disease   ARF (acute renal failure) ([place])   SIRS (systemic inflammatory response syndrome) ([place])   Crohn's disease of both small and large intestine with fistula ([place])    Results:   Labs: Results for orders placed or performed during the hospital encounter of 12/01/15 (from the past 48 hour(s))  Glucose, capillary     Status: Abnormal   Collection Time: 12/08/15 11:35 AM  Result Value Ref Range   Glucose-Capillary 121 (H) 65 - 99 mg/dL   Comment 1 Notify RN   Glucose, capillary     Status:  Abnormal   Collection Time: 12/08/15  4:40 PM  Result Value Ref Range   Glucose-Capillary 108 (H) 65 - 99 mg/dL   Comment 1 Notify RN   Glucose, capillary     Status: None   Collection Time: 12/08/15  8:25 PM  Result Value Ref Range   Glucose-Capillary 92 65 - 99 mg/dL   Comment 1 Notify RN   Glucose, capillary     Status: Abnormal   Collection Time: 12/09/15 12:19 AM  Result Value Ref Range   Glucose-Capillary 109 (H) 65 - 99 mg/dL   Comment 1 Notify RN   Procalcitonin     Status: None   Collection Time: 12/09/15  3:55 AM  Result Value Ref Range   Procalcitonin 0.40 ng/mL    Comment:        Interpretation: PCT (Procalcitonin) <= 0.5 ng/mL: Systemic infection (sepsis) is not likely. Local bacterial infection is possible. (NOTE)         ICU PCT Algorithm               Non ICU PCT Algorithm    ----------------------------     ------------------------------         PCT < 0.25 ng/mL                 PCT <  0.1 ng/mL     Stopping of antibiotics            Stopping of antibiotics       strongly encouraged.               strongly encouraged.    ----------------------------     ------------------------------       PCT level decrease by               PCT < 0.25 ng/mL       >= 80% from peak PCT       OR PCT 0.25 - 0.5 ng/mL          Stopping of antibiotics                                             encouraged.     Stopping of antibiotics           encouraged.    ----------------------------     ------------------------------       PCT level decrease by              PCT >= 0.25 ng/mL       < 80% from peak PCT        AND PCT >= 0.5 ng/mL            Continuin g antibiotics                                              encouraged.       Continuing antibiotics            encouraged.    ----------------------------     ------------------------------     PCT level increase compared          PCT > 0.5 ng/mL         with peak PCT AND          PCT >= 0.5 ng/mL             Escalation of  antibiotics                                          strongly encouraged.      Escalation of antibiotics        strongly encouraged.   CBC     Status: Abnormal   Collection Time: 12/09/15  3:55 AM  Result Value Ref Range   WBC 8.8 4.0 - 10.5 K/uL   RBC 3.22 (L) 4.22 - 5.81 MIL/uL   Hemoglobin 11.2 (L) 13.0 - 17.0 g/dL   HCT 34.5 (L) 39.0 - 52.0 %   MCV 107.1 (H) 78.0 - 100.0 fL   MCH 34.8 (H) 26.0 - 34.0 pg   MCHC 32.5 30.0 - 36.0 g/dL   RDW 13.5 11.5 - 15.5 %   Platelets 283 150 - 400 K/uL  Basic metabolic panel     Status: Abnormal   Collection Time: 12/09/15  3:55 AM  Result Value Ref Range   Sodium 139 135 - 145 mmol/L    Comment: DELTA CHECK NOTED REPEATED TO VERIFY    Potassium 3.2 (L) 3.5 -  5.1 mmol/L   Chloride 105 101 - 111 mmol/L   CO2 23 22 - 32 mmol/L   Glucose, Bld 127 (H) 65 - 99 mg/dL   BUN 11 6 - 20 mg/dL   Creatinine, Ser 1.06 0.61 - 1.24 mg/dL   Calcium 7.3 (L) 8.9 - 10.3 mg/dL   GFR calc non Af Amer >60 >60 mL/min   GFR calc Af Amer >60 >60 mL/min    Comment: (NOTE) The eGFR has been calculated using the CKD EPI equation. This calculation has not been validated in all clinical situations. eGFR's persistently <60 mL/min signify possible Chronic Kidney Disease.    Anion gap 11 5 - 15  Magnesium     Status: None   Collection Time: 12/09/15  3:55 AM  Result Value Ref Range   Magnesium 1.7 1.7 - 2.4 mg/dL  Phosphorus     Status: Abnormal   Collection Time: 12/09/15  3:55 AM  Result Value Ref Range   Phosphorus 2.1 (L) 2.5 - 4.6 mg/dL  Glucose, capillary     Status: Abnormal   Collection Time: 12/09/15  5:28 AM  Result Value Ref Range   Glucose-Capillary 124 (H) 65 - 99 mg/dL   Comment 1 Notify RN   Glucose, capillary     Status: Abnormal   Collection Time: 12/09/15  7:54 AM  Result Value Ref Range   Glucose-Capillary 124 (H) 65 - 99 mg/dL   Comment 1 Notify RN   Glucose, capillary     Status: Abnormal   Collection Time: 12/09/15 11:41 AM   Result Value Ref Range   Glucose-Capillary 109 (H) 65 - 99 mg/dL   Comment 1 Notify RN   Glucose, capillary     Status: Abnormal   Collection Time: 12/09/15  5:38 PM  Result Value Ref Range   Glucose-Capillary 109 (H) 65 - 99 mg/dL   Comment 1 Notify RN   Glucose, capillary     Status: None   Collection Time: 12/09/15 10:05 PM  Result Value Ref Range   Glucose-Capillary 97 65 - 99 mg/dL   Comment 1 Notify RN   Glucose, capillary     Status: Abnormal   Collection Time: 12/10/15  2:02 AM  Result Value Ref Range   Glucose-Capillary 103 (H) 65 - 99 mg/dL   Comment 1 Notify RN   Basic metabolic panel     Status: Abnormal   Collection Time: 12/10/15  3:36 AM  Result Value Ref Range   Sodium 140 135 - 145 mmol/L   Potassium 3.7 3.5 - 5.1 mmol/L   Chloride 105 101 - 111 mmol/L   CO2 25 22 - 32 mmol/L   Glucose, Bld 126 (H) 65 - 99 mg/dL   BUN 9 6 - 20 mg/dL   Creatinine, Ser 0.84 0.61 - 1.24 mg/dL   Calcium 7.5 (L) 8.9 - 10.3 mg/dL   GFR calc non Af Amer >60 >60 mL/min   GFR calc Af Amer >60 >60 mL/min    Comment: (NOTE) The eGFR has been calculated using the CKD EPI equation. This calculation has not been validated in all clinical situations. eGFR's persistently <60 mL/min signify possible Chronic Kidney Disease.    Anion gap 10 5 - 15  Glucose, capillary     Status: Abnormal   Collection Time: 12/10/15  6:05 AM  Result Value Ref Range   Glucose-Capillary 118 (H) 65 - 99 mg/dL   Comment 1 Notify RN   Glucose, capillary     Status:  Abnormal   Collection Time: 12/10/15  7:36 AM  Result Value Ref Range   Glucose-Capillary 133 (H) 65 - 99 mg/dL    Imaging / Studies: No results found.  Medications / Allergies:  Scheduled Meds: . antiseptic oral rinse  7 mL Mouth Rinse BID  . dextrose  1 ampule Intravenous Once  . heparin subcutaneous  5,000 Units Subcutaneous 3 times per day  . levETIRAcetam  750 mg Intravenous Q12H  . lip balm  1 application Topical BID  .  neomycin-bacitracin-polymyxin   Topical Daily  . piperacillin-tazobactam (ZOSYN)  IV  3.375 g Intravenous Q8H   Continuous Infusions: . dextrose 5 % and 0.45% NaCl 100 mL/hr at 12/10/15 0300   PRN Meds:.acetaminophen, acetaminophen **OR** acetaminophen, alum & mag hydroxide-simeth, bisacodyl, diphenhydrAMINE, magic mouthwash, menthol-cetylpyridinium, morphine injection, ondansetron (ZOFRAN) IV **OR** ondansetron (ZOFRAN) IV, ondansetron **OR** ondansetron (ZOFRAN) IV, ondansetron, phenol, phenol, promethazine  Antibiotics: Anti-infectives    Start     Dose/Rate Route Frequency Ordered Stop   12/07/15 1500  piperacillin-tazobactam (ZOSYN) IVPB 3.375 g     3.375 g 12.5 mL/hr over 240 Minutes Intravenous Every 8 hours 12/07/15 1455     12/04/15 2000  vancomycin (VANCOCIN) 1,250 mg in sodium chloride 0.9 % 250 mL IVPB  Status:  Discontinued     1,250 mg 166.7 mL/hr over 90 Minutes Intravenous Every 12 hours 12/04/15 1615 12/05/15 0759   12/02/15 2000  vancomycin (VANCOCIN) IVPB 750 mg/150 ml premix  Status:  Discontinued     750 mg 150 mL/hr over 60 Minutes Intravenous Every 12 hours 12/02/15 0735 12/04/15 1614   12/02/15 1400  piperacillin-tazobactam (ZOSYN) IVPB 3.375 g  Status:  Discontinued     3.375 g 12.5 mL/hr over 240 Minutes Intravenous 3 times per day 12/02/15 0459 12/05/15 0759   12/02/15 0800  vancomycin (VANCOCIN) 2,000 mg in sodium chloride 0.9 % 500 mL IVPB     2,000 mg 250 mL/hr over 120 Minutes Intravenous  Once 12/02/15 0730 12/02/15 1043   12/02/15 0500  piperacillin-tazobactam (ZOSYN) IVPB 3.375 g     3.375 g 100 mL/hr over 30 Minutes Intravenous  Once 12/02/15 0457 12/02/15 0647       Assessment/Plan Crohns disease Small bowel obstruction secondary to 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 Lucia Gaskins -clamp NGT and start clears -replace wound vac,  Changes M-W-F -WOC following -mobilize Confusion-pleasantly confused ID-UTI, on zosyn. BC negative to date FEN-clears, IVF VTE prophylaxis-SCD/heparin   Erby Pian, [employer] [facility] Pager [phone](7A-4:30P)   12/10/2015 8:38 AM

## 2015-12-10 NOTE — Consult Note (Addendum)
WOC ostomy follow up Stoma type/location: LLQ colostomy Stomal assessment/size: 1 and 1/2 x 1 and 5/8 oval, budded, red, moist, mucosal sloughing Peristomal assessment: intact, clear Treatment options for stomal/peristomal skin: skin barrier ring Output brown stool, liquid Ostomy pouching: 1pc.flat flexible pouching system with skin barrier ring Education provided: Patients cousin and his wife are present for the pouch and dressing change. Additional teaching provided to them, patient slept through procedure. Enrolled patient in [facility] Start Discharge program: No  WOC wound follow up.  NPWT device leaking since last night. Wound type: Surgical Measurement: 8cm x 4cm x 2.5cm Wound bed:red, moist Drainage (amount, consistency, odor) serous Periwound:intact Dressing procedure/placement/frequency: NPWT dressing removed and wound cleansed with NS, gently patted dry.  Defect filled with 1 piece of black foam, topped with drape, attached to 159mHg continuous negative pressure and an immediate seal is achieved.  Patient tolerated procedure well. WHamdennursing team will not follow, but will remain available to this patient, the nursing and medical teams.  Please re-consult if needed. Thanks, LMaudie Flakes MSN, RN, [place] CArther Abbott Pager# [phone]

## 2015-12-11 DIAGNOSIS — J690 Pneumonitis due to inhalation of food and vomit: Secondary | ICD-10-CM

## 2015-12-11 LAB — CBC
HEMATOCRIT: 36.8 % — AB (ref 39.0–52.0)
Hemoglobin: 12.2 g/dL — ABNORMAL LOW (ref 13.0–17.0)
MCH: 35.3 pg — ABNORMAL HIGH (ref 26.0–34.0)
MCHC: 33.2 g/dL (ref 30.0–36.0)
MCV: 106.4 fL — AB (ref 78.0–100.0)
PLATELETS: 431 10*3/uL — AB (ref 150–400)
RBC: 3.46 MIL/uL — ABNORMAL LOW (ref 4.22–5.81)
RDW: 13.5 % (ref 11.5–15.5)
WBC: 11.6 10*3/uL — AB (ref 4.0–10.5)

## 2015-12-11 LAB — PHOSPHORUS: Phosphorus: 2.5 mg/dL (ref 2.5–4.6)

## 2015-12-11 LAB — BASIC METABOLIC PANEL WITH GFR
Anion gap: 9 (ref 5–15)
BUN: 8 mg/dL (ref 6–20)
CO2: 25 mmol/L (ref 22–32)
Calcium: 7.7 mg/dL — ABNORMAL LOW (ref 8.9–10.3)
Chloride: 106 mmol/L (ref 101–111)
Creatinine, Ser: 0.99 mg/dL (ref 0.61–1.24)
GFR calc Af Amer: >60 mL/min
GFR calc non Af Amer: >60 mL/min
Glucose, Bld: 131 mg/dL — ABNORMAL HIGH (ref 65–99)
Potassium: 3.6 mmol/L (ref 3.5–5.1)
Sodium: 140 mmol/L (ref 135–145)

## 2015-12-11 LAB — GLUCOSE, CAPILLARY
GLUCOSE-CAPILLARY: 118 mg/dL — AB (ref 65–99)
Glucose-Capillary: 107 mg/dL — ABNORMAL HIGH (ref 65–99)
Glucose-Capillary: 116 mg/dL — ABNORMAL HIGH (ref 65–99)
Glucose-Capillary: 117 mg/dL — ABNORMAL HIGH (ref 65–99)
Glucose-Capillary: 120 mg/dL — ABNORMAL HIGH (ref 65–99)
Glucose-Capillary: 131 mg/dL — ABNORMAL HIGH (ref 65–99)

## 2015-12-11 LAB — PROCALCITONIN: PROCALCITONIN: 0.38 ng/mL

## 2015-12-11 MED ORDER — BOOST / RESOURCE BREEZE PO LIQD
1.0000 | Freq: Two times a day (BID) | ORAL | Status: DC
  Administered 2015-12-11 (×2): 1 via ORAL

## 2015-12-11 MED ORDER — ENSURE ENLIVE PO LIQD
237.0000 mL | Freq: Two times a day (BID) | ORAL | Status: DC
  Administered 2015-12-12: 237 mL via ORAL

## 2015-12-11 NOTE — Progress Notes (Signed)
[facility] Progress Note   Jonathan GIANNOTTI  PXT:[id number]  DOB: August 09, 1956  DOA: 12/01/2015 PCP: No PCP Per Patient  Brief narrative: [name] is a 60 y.o. male male with history of seizures, Crohn's disease, mild mental cognitive defect was brought to the ER after patient had multiple episodes of nausea & vomiting. CT abd and pelvis show high grade obstruction. NG tube placed in ER. Also noted to have ARF and severe lactic acidosis.  obstruction did not resolve, he underwent Ex lap with bowel resection and colostomy. He appeared to be recovering well until 2/13 when he pulled out his NG tube and became slightly confused, developed fever, and aspiration pneumonia,   Subjective: No complaint of nausea, cough or dyspnea. Abdominal pan is controlled, tolerating clear liquid with NG tube clamped..  Assessment/Plan:  SIRS (systemic inflammatory response syndrome)- possible pneumonia?? - initially due to SBO- resolved after surgery - UA shows bacteria Nitrite + and only 0-5 WBC, sputum culture with insignificant growth - Lactic acid and procalcitonin normal - Patient with an episode on 2/13 overnight. He pulled his His NG tube, developed cough and fever thereafter - Afebrile  Aspiration pneumonia - Treated with IV Zosyn, significantly improving, will discontinue IV antibiotics today  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- S/p resection and colostomy -management per surgery, hospital course complicated by prolonged  postoperative ileus, appears to be improving, tolerating clamped NG tube, tolerating clear liquid diet, NG tube discontinued by surgery today.  Dehydration  - on admission - improved with IV hydration  - cont IVF   Hypernatremia - Resolved  Hypokalemia/hypophosphatemia - Repleted    Crohn's disease / small bowel fistula - on sulfasalazine at home- will resume once able to take orals- GI following  Mild hypoglycemia - on admission -  resolved    Seizure disorder - cont Keppra via IV until able to take orals    ARF (acute renal failure) ([place]) - likely due to dehydration? -renal function has normalized  Antibiotics: Anti-infectives    Start     Dose/Rate Route Frequency Ordered Stop   12/07/15 1500  piperacillin-tazobactam (ZOSYN) IVPB 3.375 g     3.375 g 12.5 mL/hr over 240 Minutes Intravenous Every 8 hours 12/07/15 1455     12/04/15 2000  vancomycin (VANCOCIN) 1,250 mg in sodium chloride 0.9 % 250 mL IVPB  Status:  Discontinued     1,250 mg 166.7 mL/hr over 90 Minutes Intravenous Every 12 hours 12/04/15 1615 12/05/15 0759   12/02/15 2000  vancomycin (VANCOCIN) IVPB 750 mg/150 ml premix  Status:  Discontinued     750 mg 150 mL/hr over 60 Minutes Intravenous Every 12 hours 12/02/15 0735 12/04/15 1614   12/02/15 1400  piperacillin-tazobactam (ZOSYN) IVPB 3.375 g  Status:  Discontinued     3.375 g 12.5 mL/hr over 240 Minutes Intravenous 3 times per day 12/02/15 0459 12/05/15 0759   12/02/15 0800  vancomycin (VANCOCIN) 2,000 mg in sodium chloride 0.9 % 500 mL IVPB     2,000 mg 250 mL/hr over 120 Minutes Intravenous  Once 12/02/15 0730 12/02/15 1043   12/02/15 0500  piperacillin-tazobactam (ZOSYN) IVPB 3.375 g     3.375 g 100 mL/hr over 30 Minutes Intravenous  Once 12/02/15 0457 12/02/15 0647     Code Status:     Code Status Orders        Start     Ordered   12/02/15 0131  Full code   Continuous  12/02/15 0130    Code Status History    Date Active Date Inactive Code Status Order ID Comments User Context   03/31/2014  5:33 PM 04/02/2014  7:04 PM Full Code [id number]  Janece Canterbury, MD Inpatient    Advance Directive Documentation        Most Recent Value   Type of Advance Directive  Healthcare Power of Attorney [sister [name] is POA (in [place])]   Pre-existing out of facility DNR order (yellow form or pink MOST form)     "MOST" Form in Place?       Family Communication: None at bedside Disposition  Plan: expect 2-3 day hospital stay at minimal DVT prophylaxis: Heparin Consultants: gen surgery, GI Procedures: none    Objective: Filed Weights   12/05/15 1008 12/09/15 0525 12/11/15 0413  Weight: 95 kg (209 lb 7 oz) 97.5 kg (214 lb 15.2 oz) 100.6 kg (221 lb 12.5 oz)    Intake/Output Summary (Last 24 hours) at 12/11/15 1326 Last data filed at 12/11/15 0701  Gross per 24 hour  Intake 2159.17 ml  Output   1125 ml  Net 1034.17 ml     Vitals Filed Vitals:   12/10/15 1404 12/10/15 2150 12/10/15 2248 12/11/15 0413  BP: 125/66 138/67  137/60  Pulse: 76 89 88 88  Temp: 97.4 F (36.3 C) 98.6 F (37 C)  98.5 F (36.9 C)  TempSrc: Oral Oral  Oral  Resp: 20 24  26   Height:      Weight:    100.6 kg (221 lb 12.5 oz)  SpO2: 95% 94%  93%    Exam:  General:  Pt is alert, not in acute distress  HEENT: No icterus, No thrush, oral mucosa moist  Cardiovascular: regular rate and rhythm, S1/S2 No murmur  Respiratory: clear to auscultation bilaterally , no wheezing  Abdomen: Soft, + Bowel sounds, non tender, NG tube draining brown fluid, colostomy +, midline small surgical wound with wound VAC,  non distended, no guarding  MSK: No cyanosis or clubbing- no pedal edema   Data Reviewed: Basic Metabolic Panel:  Recent Labs Lab 12/07/15 0820 12/08/15 0347 12/09/15 0355 12/10/15 0336 12/11/15 0412  NA 148* 146* 139 140 140  K 3.3* 3.3* 3.2* 3.7 3.6  CL 116* 114* 105 105 106  CO2 21* 25 23 25 25   GLUCOSE 152* 127* 127* 126* 131*  BUN 13 12 11 9 8   CREATININE 1.20 1.09 1.06 0.84 0.99  CALCIUM 7.6* 7.7* 7.3* 7.5* 7.7*  MG  --   --  1.7  --   --   PHOS  --   --  2.1*  --  2.5   Liver Function Tests:  Recent Labs Lab 12/08/15 0347  AST 15  ALT 11*  ALKPHOS 57  BILITOT 2.2*  PROT 5.2*  ALBUMIN 1.9*   No results for input(s): LIPASE, AMYLASE in the last 168 hours. No results for input(s): AMMONIA in the last 168 hours. CBC:  Recent Labs Lab 12/05/15 0346  12/07/15 0820 12/09/15 0355 12/11/15 0412  WBC 6.2 7.9 8.8 11.6*  NEUTROABS 4.5  --   --   --   HGB 13.4 11.9* 11.2* 12.2*  HCT 39.3 36.8* 34.5* 36.8*  MCV 106.8* 108.6* 107.1* 106.4*  PLT 173 252 283 431*   Cardiac Enzymes: No results for input(s): CKTOTAL, CKMB, CKMBINDEX, TROPONINI in the last 168 hours. BNP (last 3 results) No results for input(s): BNP in the last 8760 hours.  ProBNP (last 3 results)  No results for input(s): PROBNP in the last 8760 hours.  CBG:  Recent Labs Lab 12/10/15 1954 12/11/15 0025 12/11/15 0410 12/11/15 0745 12/11/15 1214  GLUCAP 117* 107* 120* 117* 118*    Recent Results (from the past 240 hour(s))  Culture, Urine     Status: None   Collection Time: 12/02/15  5:30 AM  Result Value Ref Range Status   Specimen Description URINE, RANDOM  Final   Special Requests NONE  Final   Culture   Final    5,000 COLONIES/mL INSIGNIFICANT GROWTH Performed at [facility]    Report Status 12/03/2015 FINAL  Final  Culture, blood (Routine X 2) w Reflex to ID Panel     Status: None   Collection Time: 12/02/15  5:48 AM  Result Value Ref Range Status   Specimen Description BLOOD RIGHT HAND  Final   Special Requests IN PEDIATRIC BOTTLE 1.5CC  Final   Culture   Final    NO GROWTH 5 DAYS Performed at [facility]    Report Status 12/07/2015 FINAL  Final  Culture, blood (Routine X 2) w Reflex to ID Panel     Status: None   Collection Time: 12/02/15  5:48 AM  Result Value Ref Range Status   Specimen Description BLOOD RIGHT FOREARM  Final   Special Requests IN PEDIATRIC BOTTLE [place]  Final   Culture   Final    NO GROWTH 5 DAYS Performed at [facility]    Report Status 12/07/2015 FINAL  Final  Culture, blood (Routine X 2) w Reflex to ID Panel     Status: None (Preliminary result)   Collection Time: 12/07/15 10:43 AM  Result Value Ref Range Status   Specimen Description BLOOD RIGHT HAND  Final   Special Requests BOTTLES DRAWN  AEROBIC AND ANAEROBIC [place]  Final   Culture   Final    NO GROWTH 3 DAYS Performed at [facility]    Report Status PENDING  Incomplete  Culture, blood (Routine X 2) w Reflex to ID Panel     Status: None (Preliminary result)   Collection Time: 12/07/15 10:45 AM  Result Value Ref Range Status   Specimen Description BLOOD LEFT HAND  Final   Special Requests BOTTLES DRAWN AEROBIC AND ANAEROBIC 10CC  Final   Culture   Final    NO GROWTH 3 DAYS Performed at [facility]    Report Status PENDING  Incomplete  Urine culture     Status: None   Collection Time: 12/07/15 12:25 PM  Result Value Ref Range Status   Specimen Description URINE, RANDOM  Final   Special Requests NONE  Final   Culture   Final    NO GROWTH 2 DAYS Performed at [facility]    Report Status 12/09/2015 FINAL  Final  MRSA PCR Screening     Status: None   Collection Time: 12/07/15  6:16 PM  Result Value Ref Range Status   MRSA by PCR NEGATIVE NEGATIVE Final    Comment:        The GeneXpert MRSA Assay (FDA approved for NASAL specimens only), is one component of a comprehensive MRSA colonization surveillance program. It is not intended to diagnose MRSA infection nor to guide or monitor treatment for MRSA infections.      Studies: No results found.  Scheduled Meds:  Scheduled Meds: . antiseptic oral rinse  7 mL Mouth Rinse BID  . dextrose  1 ampule Intravenous Once  .  feeding supplement  1 Container Oral BID BM  . feeding supplement (ENSURE ENLIVE)  237 mL Oral BID BM  . heparin subcutaneous  5,000 Units Subcutaneous 3 times per day  . levETIRAcetam  750 mg Intravenous Q12H  . lip balm  1 application Topical BID  . neomycin-bacitracin-polymyxin   Topical Daily  . piperacillin-tazobactam (ZOSYN)  IV  3.375 g Intravenous Q8H   Continuous Infusions: . dextrose 5 % and 0.45% NaCl 1,000 mL (12/10/15 2258)    Time spent on care of this patient: 30 min   Diantha Paxson,  MD [phone] 12/11/2015, 1:26 PM  LOS: 10 days   [facility] Office  [phone] Pager - Text Page per [email or web address] If 7PM-7AM, please contact night-coverage [email or web address]

## 2015-12-11 NOTE — Progress Notes (Signed)
7 Days Post-Op  Subjective: No events; states pain about 4. 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 Drank some liquids yesterday and this am. Pt denies n/v  Objective: Vital signs in last 24 hours: Temp:  [97.4 F (36.3 C)-98.6 F (37 C)] 98.5 F (36.9 C) (02/18 0413) Pulse Rate:  [76-89] 88 (02/18 0413) Resp:  [20-26] 26 (02/18 0413) BP: (125-138)/(60-67) 137/60 mmHg (02/18 0413) SpO2:  [93 %-95 %] 93 % (02/18 0413) Weight:  [100.6 kg (221 lb 12.5 oz)] 100.6 kg (221 lb 12.5 oz) (02/18 0413) Last BM Date:  (only liquid in ostomy bag unsure of last bm)  Intake/Output from previous day: 02/17 0701 - 02/18 0700 In: 2422.5 [I.V.:1635; NG/GT:240; IV Piggyback:307.5] Out: 1326 [Urine:1125; Emesis/NG output:200; Stool:1] Intake/Output this shift: Total I/O In: 755 [I.V.:755] Out: -   Asleep, easily arousable cta b/l Soft, full, ostomy viable with liquid stool in bag. Wound vac intact No edema  Lab Results:   Recent Labs  12/09/15 0355 12/11/15 0412  WBC 8.8 11.6*  HGB 11.2* 12.2*  HCT 34.5* 36.8*  PLT 283 431*   BMET  Recent Labs  12/10/15 0336 12/11/15 0412  NA 140 140  K 3.7 3.6  CL 105 106  CO2 25 25  GLUCOSE 126* 131*  BUN 9 8  CREATININE 0.84 0.99  CALCIUM 7.5* 7.7*   PT/INR No results for input(s): LABPROT, INR in the last 72 hours. ABG No results for input(s): PHART, HCO3 in the last 72 hours.  Invalid input(s): PCO2, PO2  Studies/Results: No results found.  Anti-infectives: Anti-infectives    Start     Dose/Rate Route Frequency Ordered Stop   12/07/15 1500  piperacillin-tazobactam (ZOSYN) IVPB 3.375 g     3.375 g 12.5 mL/hr over 240 Minutes Intravenous Every 8 hours 12/07/15 1455     12/04/15 2000  vancomycin (VANCOCIN) 1,250 mg in sodium chloride 0.9 % 250 mL IVPB  Status:  Discontinued     1,250 mg 166.7 mL/hr over 90 Minutes Intravenous Every 12 hours 12/04/15 1615 12/05/15 0759   12/02/15 2000  vancomycin (VANCOCIN) IVPB 750 mg/150  ml premix  Status:  Discontinued     750 mg 150 mL/hr over 60 Minutes Intravenous Every 12 hours 12/02/15 0735 12/04/15 1614   12/02/15 1400  piperacillin-tazobactam (ZOSYN) IVPB 3.375 g  Status:  Discontinued     3.375 g 12.5 mL/hr over 240 Minutes Intravenous 3 times per day 12/02/15 0459 12/05/15 0759   12/02/15 0800  vancomycin (VANCOCIN) 2,000 mg in sodium chloride 0.9 % 500 mL IVPB     2,000 mg 250 mL/hr over 120 Minutes Intravenous  Once 12/02/15 0730 12/02/15 1043   12/02/15 0500  piperacillin-tazobactam (ZOSYN) IVPB 3.375 g     3.375 g 100 mL/hr over 30 Minutes Intravenous  Once 12/02/15 0457 12/02/15 7846      Assessment/Plan: Crohns disease Small bowel obstruction secondary to 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 Lucia Gaskins -appears to have tolerated NG clamp. Dc NG today, cont clears, will add boost - wound vac, Changes M-W-F -WOC following -mobilize Confusion-pleasantly confused ID-UTI, on zosyn. BC negative to date FEN-clears, IVF VTE prophylaxis-SCD/heparin  Leighton Ruff. Redmond Pulling, MD, FACS General, Bariatric, & Minimally Invasive Surgery [facility]   LOS: 10 days  [name] 12/11/2015

## 2015-12-12 LAB — GLUCOSE, CAPILLARY
GLUCOSE-CAPILLARY: 106 mg/dL — AB (ref 65–99)
GLUCOSE-CAPILLARY: 106 mg/dL — AB (ref 65–99)
GLUCOSE-CAPILLARY: 107 mg/dL — AB (ref 65–99)
GLUCOSE-CAPILLARY: 109 mg/dL — AB (ref 65–99)
Glucose-Capillary: 126 mg/dL — ABNORMAL HIGH (ref 65–99)
Glucose-Capillary: 108 mg/dL — ABNORMAL HIGH (ref 65–99)

## 2015-12-12 LAB — CULTURE, BLOOD (ROUTINE X 2)
CULTURE: NO GROWTH
CULTURE: NO GROWTH

## 2015-12-12 MED ORDER — LEVETIRACETAM 750 MG PO TABS
750.0000 mg | ORAL_TABLET | Freq: Two times a day (BID) | ORAL | Status: DC
  Administered 2015-12-12: 750 mg via ORAL
  Filled 2015-12-12 (×3): qty 1

## 2015-12-12 MED ORDER — PROMETHAZINE HCL 25 MG/ML IJ SOLN: 6.2500 mg | INTRAMUSCULAR | Status: DC | PRN

## 2015-12-12 MED ORDER — OXYCODONE HCL 5 MG PO TABS: 5.0000 mg | ORAL_TABLET | ORAL | Status: DC | PRN

## 2015-12-12 NOTE — Progress Notes (Addendum)
[facility] Progress Note   [name]  WUJ:[id number]  DOB: July 08, 1956  DOA: 12/01/2015 PCP: No PCP Per Patient  Brief narrative:  [name] is a 60 y.o. male male with history of seizures, Crohn's disease, mild mental cognitive defect was brought to the ER after patient had multiple episodes of nausea & vomiting. CT abd and pelvis show high grade obstruction. NG tube placed in ER. Also noted to have ARF and severe lactic acidosis.  obstruction did not resolve, he underwent Ex lap with bowel resection and colostomy. He appeared to be recovering well until 2/13 when he pulled out his NG tube and became slightly confused, developed fever, and aspiration pneumonia,   Subjective: No complaint of nausea, cough or dyspnea. Abdominal pan is controlled, tolerating clear liquid.  Assessment/Plan:  SIRS (systemic inflammatory response syndrome)- possible pneumonia?? - initially due to SBO- resolved after surgery - UA shows bacteria Nitrite + and only 0-5 WBC, sputum culture with insignificant growth - Lactic acid and procalcitonin normal - Patient with an episode on 2/13 overnight. He pulled his His NG tube, developed cough and fever thereafter,for which he was treated for aspiration pneumonia. - Afebrile  Aspiration pneumonia - Treated with IV Zosyn, significantly improving, inished antibiotics on 2/18  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- S/p resection and colostomy -management per surgery, hospital course complicated by prolonged  postoperative ileus, appears to be improving, tolerating clamped NG tube, NG tube discontinued by surgery 2/18,advanced to full liquid diet today.  Dehydration  - on admission - improved with IV hydration  - cont IVF   Hypernatremia - Resolved  Hypokalemia/hypophosphatemia - Repleted    Crohn's disease / small bowel fistula - on sulfasalazine at home- will discussed with GI if appropriate to resume.  Mild hypoglycemia - on  admission - resolved    Seizure disorder - cont with Keppra.    ARF (acute renal failure) ([facility]) - likely due to dehydration, - resolved  Antibiotics: Anti-infectives    Start     Dose/Rate Route Frequency Ordered Stop   12/07/15 1500  piperacillin-tazobactam (ZOSYN) IVPB 3.375 g  Status:  Discontinued     3.375 g 12.5 mL/hr over 240 Minutes Intravenous Every 8 hours 12/07/15 1455 12/11/15 1333   12/04/15 2000  vancomycin (VANCOCIN) 1,250 mg in sodium chloride 0.9 % 250 mL IVPB  Status:  Discontinued     1,250 mg 166.7 mL/hr over 90 Minutes Intravenous Every 12 hours 12/04/15 1615 12/05/15 0759   12/02/15 2000  vancomycin (VANCOCIN) IVPB 750 mg/150 ml premix  Status:  Discontinued     750 mg 150 mL/hr over 60 Minutes Intravenous Every 12 hours 12/02/15 0735 12/04/15 1614   12/02/15 1400  piperacillin-tazobactam (ZOSYN) IVPB 3.375 g  Status:  Discontinued     3.375 g 12.5 mL/hr over 240 Minutes Intravenous 3 times per day 12/02/15 0459 12/05/15 0759   12/02/15 0800  vancomycin (VANCOCIN) 2,000 mg in sodium chloride 0.9 % 500 mL IVPB     2,000 mg 250 mL/hr over 120 Minutes Intravenous  Once 12/02/15 0730 12/02/15 1043   12/02/15 0500  piperacillin-tazobactam (ZOSYN) IVPB 3.375 g     3.375 g 100 mL/hr over 30 Minutes Intravenous  Once 12/02/15 0457 12/02/15 0647     Code Status:     Code Status Orders        Start     Ordered   12/02/15 0131  Full code   Continuous  12/02/15 0130    Code Status History    Date Active Date Inactive Code Status Order ID Comments User Context   03/31/2014  5:33 PM 04/02/2014  7:04 PM Full Code [id number]  Janece Canterbury, MD Inpatient    Advance Directive Documentation        Most Recent Value   Type of Advance Directive  Healthcare Power of Attorney [sister [name] is POA (in [place])]   Pre-existing out of facility DNR order (yellow form or pink MOST form)     "MOST" Form in Place?       Family Communication: scars with mother and multiple  family members at bedside 2/18 Disposition Plan: expect 2-3 day hospital stay at minimal DVT prophylaxis: Heparin Consultants: gen surgery, GI Procedures: none    Objective: Filed Weights   12/05/15 1008 12/09/15 0525 12/11/15 0413  Weight: 95 kg (209 lb 7 oz) 97.5 kg (214 lb 15.2 oz) 100.6 kg (221 lb 12.5 oz)    Intake/Output Summary (Last 24 hours) at 12/12/15 1536 Last data filed at 12/12/15 0438  Gross per 24 hour  Intake      0 ml  Output    870 ml  Net   -870 ml     Vitals Filed Vitals:   12/11/15 0413 12/11/15 1341 12/11/15 1959 12/12/15 0435  BP: 137/60 128/62 117/49 129/55  Pulse: 88 82 92 83  Temp: 98.5 F (36.9 C) 98.4 F (36.9 C) 100 F (37.8 C) 98.3 F (36.8 C)  TempSrc: Oral Oral Axillary Oral  Resp: 26 20 16 20   Height:      Weight: 100.6 kg (221 lb 12.5 oz)     SpO2: 93% 96% 95% 93%    Exam:  General:  Pt is alert, not in acute distress  HEENT: No icterus, No thrush, oral mucosa moist  Cardiovascular: regular rate and rhythm, S1/S2 No murmur  Respiratory: clear to auscultation bilaterally , no wheezing  Abdomen: Soft, + Bowel sounds, non tender, NG tube draining brown fluid, colostomy +, midline small surgical wound with wound VAC,  non distended, no guarding  MSK: No cyanosis or clubbing- no pedal edema   Data Reviewed: Basic Metabolic Panel:  Recent Labs Lab 12/07/15 0820 12/08/15 0347 12/09/15 0355 12/10/15 0336 12/11/15 0412  NA 148* 146* 139 140 140  K 3.3* 3.3* 3.2* 3.7 3.6  CL 116* 114* 105 105 106  CO2 21* 25 23 25 25   GLUCOSE 152* 127* 127* 126* 131*  BUN 13 12 11 9 8   CREATININE 1.20 1.09 1.06 0.84 0.99  CALCIUM 7.6* 7.7* 7.3* 7.5* 7.7*  MG  --   --  1.7  --   --   PHOS  --   --  2.1*  --  2.5   Liver Function Tests:  Recent Labs Lab 12/08/15 0347  AST 15  ALT 11*  ALKPHOS 57  BILITOT 2.2*  PROT 5.2*  ALBUMIN 1.9*   No results for input(s): LIPASE, AMYLASE in the last 168 hours. No results for input(s):  AMMONIA in the last 168 hours. CBC:  Recent Labs Lab 12/07/15 0820 12/09/15 0355 12/11/15 0412  WBC 7.9 8.8 11.6*  HGB 11.9* 11.2* 12.2*  HCT 36.8* 34.5* 36.8*  MCV 108.6* 107.1* 106.4*  PLT 252 283 431*   Cardiac Enzymes: No results for input(s): CKTOTAL, CKMB, CKMBINDEX, TROPONINI in the last 168 hours. BNP (last 3 results) No results for input(s): BNP in the last 8760 hours.  ProBNP (last 3 results) No  results for input(s): PROBNP in the last 8760 hours.  CBG:  Recent Labs Lab 12/11/15 1956 12/12/15 0020 12/12/15 0410 12/12/15 0752 12/12/15 1212  GLUCAP 116* 106* 106* 109* 126*    Recent Results (from the past 240 hour(s))  Culture, blood (Routine X 2) w Reflex to ID Panel     Status: None (Preliminary result)   Collection Time: 12/07/15 10:43 AM  Result Value Ref Range Status   Specimen Description BLOOD RIGHT HAND  Final   Special Requests BOTTLES DRAWN AEROBIC AND ANAEROBIC 8CC  Final   Culture   Final    NO GROWTH 4 DAYS Performed at [facility]    Report Status PENDING  Incomplete  Culture, blood (Routine X 2) w Reflex to ID Panel     Status: None (Preliminary result)   Collection Time: 12/07/15 10:45 AM  Result Value Ref Range Status   Specimen Description BLOOD LEFT HAND  Final   Special Requests BOTTLES DRAWN AEROBIC AND ANAEROBIC 10CC  Final   Culture   Final    NO GROWTH 4 DAYS Performed at [facility]    Report Status PENDING  Incomplete  Urine culture     Status: None   Collection Time: 12/07/15 12:25 PM  Result Value Ref Range Status   Specimen Description URINE, RANDOM  Final   Special Requests NONE  Final   Culture   Final    NO GROWTH 2 DAYS Performed at [facility]    Report Status 12/09/2015 FINAL  Final  MRSA PCR Screening     Status: None   Collection Time: 12/07/15  6:16 PM  Result Value Ref Range Status   MRSA by PCR NEGATIVE NEGATIVE Final    Comment:        The GeneXpert MRSA Assay (FDA approved  for NASAL specimens only), is one component of a comprehensive MRSA colonization surveillance program. It is not intended to diagnose MRSA infection nor to guide or monitor treatment for MRSA infections.      Studies: No results found.  Scheduled Meds:  Scheduled Meds: . antiseptic oral rinse  7 mL Mouth Rinse BID  . dextrose  1 ampule Intravenous Once  . feeding supplement  1 Container Oral BID BM  . feeding supplement (ENSURE ENLIVE)  237 mL Oral BID BM  . heparin subcutaneous  5,000 Units Subcutaneous 3 times per day  . levETIRAcetam  750 mg Intravenous Q12H  . lip balm  1 application Topical BID  . neomycin-bacitracin-polymyxin   Topical Daily   Continuous Infusions: . dextrose 5 % and 0.45% NaCl 50 mL/hr at 12/12/15 0831    Time spent on care of this patient: 53 min   Travis Mastel, MD [phone] 12/12/2015, 3:36 PM  LOS: 11 days   [facility] Office  [phone] Pager - Text Page per [email or web address] If 7PM-7AM, please contact night-coverage [email or web address]

## 2015-12-12 NOTE — Progress Notes (Signed)
8 Days Post-Op  Subjective: No c/o other than dry mouth. Denies n/v. States he has no pain  Objective: Vital signs in last 24 hours: Temp:  [98.3 F (36.8 C)-100 F (37.8 C)] 98.3 F (36.8 C) (02/19 0435) Pulse Rate:  [82-92] 83 (02/19 0435) Resp:  [16-20] 20 (02/19 0435) BP: (117-129)/(49-62) 129/55 mmHg (02/19 0435) SpO2:  [93 %-96 %] 93 % (02/19 0435) Last BM Date: 12/11/15  Intake/Output from previous day: 02/18 0701 - 02/19 0700 In: 755 [I.V.:755] Out: 1570 [Urine:1200; Stool:370] Intake/Output this shift:    Alert, nad Soft, full, nt, wound vac intact. Ostomy viable with stool in bag  Lab Results:   Recent Labs  12/11/15 0412  WBC 11.6*  HGB 12.2*  HCT 36.8*  PLT 431*   BMET  Recent Labs  12/10/15 0336 12/11/15 0412  NA 140 140  K 3.7 3.6  CL 105 106  CO2 25 25  GLUCOSE 126* 131*  BUN 9 8  CREATININE 0.84 0.99  CALCIUM 7.5* 7.7*   PT/INR No results for input(s): LABPROT, INR in the last 72 hours. ABG No results for input(s): PHART, HCO3 in the last 72 hours.  Invalid input(s): PCO2, PO2  Studies/Results: No results found.  Anti-infectives: Anti-infectives    Start     Dose/Rate Route Frequency Ordered Stop   12/07/15 1500  piperacillin-tazobactam (ZOSYN) IVPB 3.375 g  Status:  Discontinued     3.375 g 12.5 mL/hr over 240 Minutes Intravenous Every 8 hours 12/07/15 1455 12/11/15 1333   12/04/15 2000  vancomycin (VANCOCIN) 1,250 mg in sodium chloride 0.9 % 250 mL IVPB  Status:  Discontinued     1,250 mg 166.7 mL/hr over 90 Minutes Intravenous Every 12 hours 12/04/15 1615 12/05/15 0759   12/02/15 2000  vancomycin (VANCOCIN) IVPB 750 mg/150 ml premix  Status:  Discontinued     750 mg 150 mL/hr over 60 Minutes Intravenous Every 12 hours 12/02/15 0735 12/04/15 1614   12/02/15 1400  piperacillin-tazobactam (ZOSYN) IVPB 3.375 g  Status:  Discontinued     3.375 g 12.5 mL/hr over 240 Minutes Intravenous 3 times per day 12/02/15 0459 12/05/15 0759    12/02/15 0800  vancomycin (VANCOCIN) 2,000 mg in sodium chloride 0.9 % 500 mL IVPB     2,000 mg 250 mL/hr over 120 Minutes Intravenous  Once 12/02/15 0730 12/02/15 1043   12/02/15 0500  piperacillin-tazobactam (ZOSYN) IVPB 3.375 g     3.375 g 100 mL/hr over 30 Minutes Intravenous  Once 12/02/15 0457 12/02/15 4287      Assessment/Plan: Crohns disease Small bowel obstruction secondary to 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 Lucia Gaskins -adv diet to fulls. Unclear how much eating/drinking. Start calorie counts. - wound vac, Changes M-W-F -WOC following -mobilize Confusion-pleasantly confused ID-zosyn. abx per primary service. BC negative to date FEN-full liquid diet, shakes; decreased mIVF VTE prophylaxis-SCD/heparin  Leighton Ruff. Redmond Pulling, MD, FACS General, Bariatric, & Minimally Invasive Surgery [facility], [place]   LOS: 11 days    Gayland Curry 12/12/2015

## 2015-12-12 NOTE — Progress Notes (Signed)
Calorie Count Note  48 hour calorie count ordered per surgery. RD to follow-up with day 1 results 2/20.  Please hang calorie count envelope on the patient's door. Document percent consumed for each item on the patient's meal tray ticket and keep in envelope. Also document percent of any supplement or snack pt consumes and keep documentation in envelope for RD to review.   Clayton Bibles, MS, RD, LDN Pager: [phone] After Hours Pager: [phone]

## 2015-12-13 LAB — GLUCOSE, CAPILLARY
GLUCOSE-CAPILLARY: 94 mg/dL (ref 65–99)
GLUCOSE-CAPILLARY: 104 mg/dL — AB (ref 65–99)
GLUCOSE-CAPILLARY: 97 mg/dL (ref 65–99)
GLUCOSE-CAPILLARY: 99 mg/dL (ref 65–99)
Glucose-Capillary: 105 mg/dL — ABNORMAL HIGH (ref 65–99)
Glucose-Capillary: 98 mg/dL (ref 65–99)

## 2015-12-13 MED ORDER — LEVETIRACETAM 500 MG/5ML IV SOLN
750.0000 mg | Freq: Two times a day (BID) | INTRAVENOUS | Status: DC
  Administered 2015-12-13: 750 mg via INTRAVENOUS
  Filled 2015-12-13 (×3): qty 7.5

## 2015-12-13 NOTE — Clinical Social Work Placement (Signed)
   CLINICAL SOCIAL WORK PLACEMENT  NOTE  Date:  12/13/2015  Patient Details  Name: [name] MRN: [id number] Date of Birth: Aug 02, 1956  Clinical Social Work is seeking post-discharge placement for this patient at the   level of care (*CSW will initial, date and re-position this form in  chart as items are completed):  Yes   Patient/family provided with [facility] Work Department's list of facilities offering this level of care within the geographic area requested by the patient (or if unable, by the patient's family).  Yes   Patient/family informed of their freedom to choose among providers that offer the needed level of care, that participate in Medicare, Medicaid or managed care program needed by the patient, have an available bed and are willing to accept the patient.  Yes   Patient/family informed of [facility]'s ownership interest in [facility] and [facility], as well as of the fact that they are under no obligation to receive care at these facilities.  PASRR submitted to EDS on 12/10/15     PASRR number received on 12/10/15     Existing PASRR number confirmed on       FL2 transmitted to all facilities in geographic area requested by pt/family on 12/10/15     FL2 transmitted to all facilities within larger geographic area on       Patient informed that his/her managed care company has contracts with or will negotiate with certain facilities, including the following:            Patient/family informed of bed offers received.  Patient chooses bed at       Physician recommends and patient chooses bed at      Patient to be transferred to   on  .  Patient to be transferred to facility by       Patient family notified on   of transfer.  Name of family member notified:        PHYSICIAN Please sign FL2     Additional Comment:    _______________________________________________ Ladell Pier, LCSW 12/13/2015, 4:20 PM

## 2015-12-13 NOTE — Progress Notes (Signed)
Patient ID: [name], male   DOB: 01-04-1956, 60 y.o.   MRN: [id number]     [place]      [address] [place], [place] [address]    Phone: [phone] FAX: [phone]     Subjective: Awake.  No pain.  No n/v.  Tolerating fulls, awaiting tray.  On calorie count.  Objective:  Vital signs:  Filed Vitals:   12/12/15 0435 12/12/15 1545 12/12/15 1944 12/13/15 0410  BP: 129/55 150/57 134/59 127/60  Pulse: 83 99 90 87  Temp: 98.3 F (36.8 C) 98.3 F (36.8 C) 99.7 F (37.6 C) 98.3 F (36.8 C)  TempSrc: Oral Oral Oral Oral  Resp: 20 18 18 20   Height:      Weight:      SpO2: 93% 95% 95% 93%    Last BM Date: 12/12/15  Intake/Output   Yesterday:  02/19 0701 - 02/20 0700 In: -  Out: 2100 [Urine:1500; Stool:600] This shift:     Physical Exam: General: Pt awake/alert/oriented to person. No acute distress  Abdomen: Soft. Nondistended.non tender. Midline wound-vac in place. Ostomy superficial dark, but viable underneath. stool in bag.    Problem List:   Principal Problem:   SBO (small bowel obstruction) (HCC) Active Problems:   Crohn's disease ([place])   Personal history of immunosupression therapy   Seizure disorder ([place])   S/P partial colectomy for Crohn's disease   ARF (acute renal failure) ([place])   SIRS (systemic inflammatory response syndrome) ([place])   Crohn's disease of both small and large intestine with fistula ([place])    Results:   Labs: Results for orders placed or performed during the hospital encounter of 12/01/15 (from the past 48 hour(s))  Glucose, capillary     Status: Abnormal   Collection Time: 12/11/15 12:14 PM  Result Value Ref Range   Glucose-Capillary 118 (H) 65 - 99 mg/dL  Glucose, capillary     Status: Abnormal   Collection Time: 12/11/15  4:21 PM  Result Value Ref Range   Glucose-Capillary 131 (H) 65 - 99 mg/dL  Glucose, capillary     Status: Abnormal   Collection Time: 12/11/15   7:56 PM  Result Value Ref Range   Glucose-Capillary 116 (H) 65 - 99 mg/dL   Comment 1 Notify RN   Glucose, capillary     Status: Abnormal   Collection Time: 12/12/15 12:20 AM  Result Value Ref Range   Glucose-Capillary 106 (H) 65 - 99 mg/dL   Comment 1 Notify RN   Glucose, capillary     Status: Abnormal   Collection Time: 12/12/15  4:10 AM  Result Value Ref Range   Glucose-Capillary 106 (H) 65 - 99 mg/dL   Comment 1 Notify RN   Glucose, capillary     Status: Abnormal   Collection Time: 12/12/15  7:52 AM  Result Value Ref Range   Glucose-Capillary 109 (H) 65 - 99 mg/dL   Comment 1 Document in Chart   Glucose, capillary     Status: Abnormal   Collection Time: 12/12/15 12:12 PM  Result Value Ref Range   Glucose-Capillary 126 (H) 65 - 99 mg/dL  Glucose, capillary     Status: Abnormal   Collection Time: 12/12/15  4:20 PM  Result Value Ref Range   Glucose-Capillary 107 (H) 65 - 99 mg/dL  Glucose, capillary     Status: Abnormal   Collection Time: 12/12/15  7:42 PM  Result Value Ref Range   Glucose-Capillary  108 (H) 65 - 99 mg/dL  Glucose, capillary     Status: Abnormal   Collection Time: 12/13/15 12:13 AM  Result Value Ref Range   Glucose-Capillary 105 (H) 65 - 99 mg/dL   Comment 1 Notify RN   Glucose, capillary     Status: None   Collection Time: 12/13/15  4:12 AM  Result Value Ref Range   Glucose-Capillary 94 65 - 99 mg/dL    Imaging / Studies: No results found.  Medications / Allergies:  Scheduled Meds: . antiseptic oral rinse  7 mL Mouth Rinse BID  . dextrose  1 ampule Intravenous Once  . feeding supplement  1 Container Oral BID BM  . feeding supplement (ENSURE ENLIVE)  237 mL Oral BID BM  . heparin subcutaneous  5,000 Units Subcutaneous 3 times per day  . levETIRAcetam  750 mg Oral BID  . lip balm  1 application Topical BID  . neomycin-bacitracin-polymyxin   Topical Daily   Continuous Infusions: . dextrose 5 % and 0.45% NaCl 50 mL/hr at 12/12/15 0831   PRN  Meds:.acetaminophen, acetaminophen **OR** acetaminophen, alum & mag hydroxide-simeth, bisacodyl, diphenhydrAMINE, magic mouthwash, menthol-cetylpyridinium, morphine injection, ondansetron (ZOFRAN) IV **OR** ondansetron (ZOFRAN) IV, ondansetron **OR** ondansetron (ZOFRAN) IV, oxyCODONE, phenol, promethazine  Antibiotics: Anti-infectives    Start     Dose/Rate Route Frequency Ordered Stop   12/07/15 1500  piperacillin-tazobactam (ZOSYN) IVPB 3.375 g  Status:  Discontinued     3.375 g 12.5 mL/hr over 240 Minutes Intravenous Every 8 hours 12/07/15 1455 12/11/15 1333   12/04/15 2000  vancomycin (VANCOCIN) 1,250 mg in sodium chloride 0.9 % 250 mL IVPB  Status:  Discontinued     1,250 mg 166.7 mL/hr over 90 Minutes Intravenous Every 12 hours 12/04/15 1615 12/05/15 0759   12/02/15 2000  vancomycin (VANCOCIN) IVPB 750 mg/150 ml premix  Status:  Discontinued     750 mg 150 mL/hr over 60 Minutes Intravenous Every 12 hours 12/02/15 0735 12/04/15 1614   12/02/15 1400  piperacillin-tazobactam (ZOSYN) IVPB 3.375 g  Status:  Discontinued     3.375 g 12.5 mL/hr over 240 Minutes Intravenous 3 times per day 12/02/15 0459 12/05/15 0759   12/02/15 0800  vancomycin (VANCOCIN) 2,000 mg in sodium chloride 0.9 % 500 mL IVPB     2,000 mg 250 mL/hr over 120 Minutes Intravenous  Once 12/02/15 0730 12/02/15 1043   12/02/15 0500  piperacillin-tazobactam (ZOSYN) IVPB 3.375 g     3.375 g 100 mL/hr over 30 Minutes Intravenous  Once 12/02/15 0457 12/02/15 8115        Assessment/Plan: Crohns disease Small bowel obstruction secondary to 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 Lucia Gaskins -stable, advance diet, calorie count to ensure PO intake is adequate.   - wound vac, Changes M-W-F -WOC following -mobilize FEN-soft diet VTE prophylaxis-SCD/heparin   Erby Pian, [employer] [facility] Pager [phone](7A-4:30P)   12/13/2015 8:44 AM

## 2015-12-13 NOTE — Progress Notes (Addendum)
Patient Demographics  [name], is a 60 y.o. male, DOB - 26-Nov-1955, BWG:[id number]  Admit date - 12/01/2015   Admitting Physician Rise Patience, MD  Outpatient Primary MD for the patient is No PCP Per Patient  LOS - 12   Chief Complaint  Patient presents with  . Abdominal Pain       Admission HPI/Brief narrative:  60 y.o. male male with history of seizures, Crohn's disease, mild mental cognitive defect was brought to the ER after patient had multiple episodes of nausea & vomiting. CT abd and pelvis show high grade obstruction. NG tube placed in ER. Also noted to have ARF and severe lactic acidosis.  As obstruction did not resolve, he underwent Ex lap with bowel resection and colostomy. He appeared to be recovering well until 2/13 when he pulled out his NG tube and became slightly confused. With fever and hypoxia, treated with IV Zosyn for aspiration pneumonia, currently resolved, continues to improve, diet advanced gradually.  Subjective:   Coralie Common today has, No headache, No chest pain, No abdominal pain - No Nausea, no vomiting, tolerating full liquid diet . Assessment & Plan    Principal Problem:   SBO (small bowel obstruction) (HCC) Active Problems:   Crohn's disease ([place])   Personal history of immunosupression therapy   Seizure disorder ([place])   S/P partial colectomy for Crohn's disease   ARF (acute renal failure) ([place])   SIRS (systemic inflammatory response syndrome) (HCC)   Crohn's disease of both small and large intestine with fistula ([place])  SIRS - initially due to SBO, which resolved after surgery - Urinalysis with no white blood cells, but significant for nitrite, urine culture with no significant growth - Lactic acid and procalcitonin normal - Patient with an episode on 2/13 overnight. His NG tube, since then having cough, fever, started empirically on IV Zosyn  for aspiration pneumonia.  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 Lucia Gaskins - Initially nothing by mouth hospice hospital course complicated by prolonged postoperative ileus, significantly improved, NG tube discontinued. - Advance diet as tolerated, currently on soft diet - Continue with calorie count per surgery recommendation - Continue with wound VAC, change Monday/Wednesday/Friday, continue with colostomy care -management per surgery  Aspiration pneumonia - Treated with IV Zosyn, significantly improving, finished antibiotics on 2/18  Dehydration - on admission - improved with IV hydration  - cont IVF while NPO- cont to follow I and O carefully   Hypernatremia - Resolved  Hypokalemia - Repleted   Crohn's disease / small bowel fistula - on sulfasalazine at home, discussed with GI, at this point won't resume her home medication given inflamed bowel part is resected, continue to follow with GI as an outpatient for further recommendation.  Mild hypoglycemia - on admission - resolved   Seizure disorder - Continue with Keppra   ARF (acute renal failure) ([place]) - likely due to dehydration? -renal function has normalized  Code Status: Full  Family Communication: Discussed with mother at bedside 2/18  Disposition  Plan: SNF when cleared by surgery   Procedures   S/p Laparoscopic enterolysis of adhesions , small bowel resection,and  left hemicolectomy with mobilization of splenic flexure, and left transverse colon end colostomy with Hartmann pouch, 12/04/15, Dr. Lucia Gaskins   Consults   Gen. Surgery GI   Medications  Scheduled Meds: . antiseptic oral rinse  7 mL Mouth Rinse BID  .  dextrose  1 ampule Intravenous Once  . feeding supplement  1 Container Oral BID BM  . feeding supplement (ENSURE ENLIVE)  237 mL Oral BID BM  . heparin subcutaneous  5,000 Units Subcutaneous 3 times per day  . levETIRAcetam  750 mg Oral BID  . lip balm  1 application Topical BID  . neomycin-bacitracin-polymyxin   Topical Daily   Continuous Infusions: . dextrose 5 % and 0.45% NaCl 50 mL/hr at 12/12/15 0831   PRN Meds:.acetaminophen, acetaminophen **OR** acetaminophen, alum & mag hydroxide-simeth, bisacodyl, diphenhydrAMINE, magic mouthwash, menthol-cetylpyridinium, morphine injection, ondansetron (ZOFRAN) IV **OR** ondansetron (ZOFRAN) IV, ondansetron **OR** ondansetron (ZOFRAN) IV, oxyCODONE, phenol, promethazine  DVT Prophylaxis  Heparin  Lab Results  Component Value Date   PLT 431* 12/11/2015    Antibiotics    Anti-infectives    Start     Dose/Rate Route Frequency Ordered Stop   12/07/15 1500  piperacillin-tazobactam (ZOSYN) IVPB 3.375 g  Status:  Discontinued     3.375 g 12.5 mL/hr over 240 Minutes Intravenous Every 8 hours 12/07/15 1455 12/11/15 1333   12/04/15 2000  vancomycin (VANCOCIN) 1,250 mg in sodium chloride 0.9 % 250 mL IVPB  Status:  Discontinued     1,250 mg 166.7 mL/hr over 90 Minutes Intravenous Every 12 hours 12/04/15 1615 12/05/15 0759   12/02/15 2000  vancomycin (VANCOCIN) IVPB 750 mg/150 ml premix  Status:  Discontinued     750 mg 150 mL/hr over 60 Minutes Intravenous Every 12 hours 12/02/15 0735 12/04/15 1614   12/02/15 1400  piperacillin-tazobactam (ZOSYN) IVPB 3.375 g  Status:  Discontinued     3.375 g 12.5 mL/hr over 240 Minutes Intravenous 3 times per day 12/02/15 0459 12/05/15 0759   12/02/15 0800  vancomycin (VANCOCIN) 2,000 mg in sodium chloride 0.9 % 500 mL IVPB     2,000 mg 250 mL/hr over 120 Minutes Intravenous  Once 12/02/15 0730 12/02/15 1043   12/02/15 0500  piperacillin-tazobactam (ZOSYN) IVPB 3.375 g     3.375 g 100 mL/hr over 30 Minutes  Intravenous  Once 12/02/15 0457 12/02/15 0647          Objective:   Filed Vitals:   12/12/15 1545 12/12/15 1944 12/13/15 0410 12/13/15 1229  BP: 150/57 134/59 127/60 132/64  Pulse: 99 90 87 90  Temp: 98.3 F (36.8 C) 99.7 F (37.6 C) 98.3 F (36.8 C) 98.7 F (37.1 C)  TempSrc: Oral Oral Oral Oral  Resp: 18 18 20 20   Height:      Weight:      SpO2: 95% 95% 93% 94%    Wt Readings from Last 3 Encounters:  12/11/15 100.6 kg (221 lb 12.5 oz)  09/29/15 92.987 kg (205 lb)  03/30/15 95.709 kg (211 lb)     Intake/Output Summary (Last 24 hours) at 12/13/15 1258 Last data filed at 12/13/15 1100  Gross per 24 hour  Intake    100 ml  Output   2050 ml  Net  -1950 ml     Physical Exam  General: Pt is alert, not in acute distress  HEENT: No icterus, No thrush, oral  mucosa moist  Cardiovascular: regular rate and rhythm, S1/S2 No murmur  Respiratory: clear to auscultation bilaterally , no wheezing  Abdomen: Soft, + Bowel sounds, non tender, NG tube draining brown fluid, colostomy +, midline small surgical wound with wound VAC, non distended, no guarding  MSK: No cyanosis or clubbing- no pedal edema    Data Review   Micro Results Recent Results (from the past 240 hour(s))  Culture, blood (Routine X 2) w Reflex to ID Panel     Status: None   Collection Time: 12/07/15 10:43 AM  Result Value Ref Range Status   Specimen Description BLOOD RIGHT HAND  Final   Special Requests BOTTLES DRAWN AEROBIC AND ANAEROBIC 8CC  Final   Culture   Final    NO GROWTH 5 DAYS Performed at [facility]    Report Status 12/12/2015 FINAL  Final  Culture, blood (Routine X 2) w Reflex to ID Panel     Status: None   Collection Time: 12/07/15 10:45 AM  Result Value Ref Range Status   Specimen Description BLOOD LEFT HAND  Final   Special Requests BOTTLES DRAWN AEROBIC AND ANAEROBIC 10CC  Final   Culture   Final    NO GROWTH 5 DAYS Performed at [facility]    Report  Status 12/12/2015 FINAL  Final  Urine culture     Status: None   Collection Time: 12/07/15 12:25 PM  Result Value Ref Range Status   Specimen Description URINE, RANDOM  Final   Special Requests NONE  Final   Culture   Final    NO GROWTH 2 DAYS Performed at [facility]    Report Status 12/09/2015 FINAL  Final  MRSA PCR Screening     Status: None   Collection Time: 12/07/15  6:16 PM  Result Value Ref Range Status   MRSA by PCR NEGATIVE NEGATIVE Final    Comment:        The GeneXpert MRSA Assay (FDA approved for NASAL specimens only), is one component of a comprehensive MRSA colonization surveillance program. It is not intended to diagnose MRSA infection nor to guide or monitor treatment for MRSA infections.     Radiology Reports Ct Abdomen Pelvis Wo Contrast  12/01/2015  CLINICAL DATA:  Upper abdominal discomfort made worse by eating. EXAM: CT ABDOMEN AND PELVIS WITHOUT CONTRAST TECHNIQUE: Multidetector CT imaging of the abdomen and pelvis was performed following the standard protocol without IV contrast. COMPARISON:  Multiple priors, most recent 01/05/2015. FINDINGS: Lower chest: BILATERAL lower lobe opacities, likely aspiration secondary to nausea and vomiting. Hepatobiliary: No mass visualized on this un-enhanced exam. Gallstones. Pancreas: No mass or inflammatory process identified on this un-enhanced exam. Spleen: Within normal limits in size. Adrenals/Urinary Tract: No evidence of urolithiasis or hydronephrosis. No definite mass visualized on this un-enhanced exam. Stomach/Bowel: Markedly distended stomach, and small bowel. Partial/RIGHT hemicolectomy. Severe distal small bowel obstruction. Transition zone LEFT mid abdomen, likely secondary to adhesions. Free fluid in the pelvis without signs of pneumatosis or perforation. Vascular/Lymphatic: No pathologically enlarged lymph nodes. No evidence of abdominal aortic aneurysm. Reproductive: No mass or other significant  abnormality. Normal prostate. Musculoskeletal:  No suspicious bone lesions identified. IMPRESSION: High-grade small bowel obstruction, likely due to adhesions. No pneumoperitoneum. Small amount of free fluid pelvis. Previous partial colectomy. BILATERAL lower lobe opacities, likely aspiration for nausea and vomiting. Gallstones. Electronically Signed   By: Staci Righter M.D.   On: 12/01/2015 18:51   Dg Chest 1 View  12/07/2015  CLINICAL DATA:  Seizure activity EXAM: CHEST 1 VIEW COMPARISON:  12/02/2015 FINDINGS: Cardiac shadow is enlarged but stable. A nasogastric catheter is seen in the stomach. Patchy bibasilar changes are again seen and stable. No new focal abnormality is noted. IMPRESSION: Patchy bibasilar changes stable from the prior exam. Electronically Signed   By: Inez Catalina M.D.   On: 12/07/2015 10:13   Dg Abd 1 View  12/07/2015  CLINICAL DATA:  Check nasogastric catheter placement EXAM: ABDOMEN - 1 VIEW COMPARISON:  12/03/2015 FINDINGS: Nasogastric catheter is noted within the distal stomach. Scattered large and small bowel gas is noted. Mild persistent small bowel dilatation is noted. IMPRESSION: Nasogastric catheter within the stomach. Persistent small bowel dilatation is noted. Electronically Signed   By: Inez Catalina M.D.   On: 12/07/2015 10:14   Dg Chest Port 1 View  12/02/2015  CLINICAL DATA:  Followup of small bowel obstruction, sepsis. EXAM: PORTABLE CHEST 1 VIEW COMPARISON:  Chest CT 03/31/2014.  Abdominal CT of 12/01/2015. FINDINGS: nasogastric tube extends beyond the inferior aspect of the film. Midline trachea. Cardiomegaly accentuated by AP portable technique. Probable small left pleural effusion. No pneumothorax. Patchy bibasilar airspace disease. Low lung volumes with resultant pulmonary interstitial prominence. IMPRESSION: Bibasilar airspace disease, suspicious for pneumonia or aspiration. Similar to the prior CT. Cardiomegaly and mildly low lung volumes. Electronically Signed    By: Abigail Miyamoto M.D.   On: 12/02/2015 10:35   Dg Abd 2 Views  12/03/2015  CLINICAL DATA:  Followup small-bowel obstruction EXAM: ABDOMEN - 2 VIEW COMPARISON:  12/02/2015 FINDINGS: The small bowel dilatation is very similar to the prior study with small bowel loops measuring up to 6 cm. Proximal to mid small bowel is distended with decompressed distal small bowel and decompressed colon. There is air seen in to the distal colon. An NG tube projects over the anticipated position of the proximal duodenum. A tiny volume of contrast is seen within the stomach. The dilated loops show air-fluid levels. There is no evidence of pneumoperitoneum. IMPRESSION: Incomplete but relatively high-grade small-bowel obstruction very similar to prior study. Electronically Signed   By: Skipper Cliche M.D.   On: 12/03/2015 08:37   Dg Abd 2 Views  12/02/2015  CLINICAL DATA:  Small bowel obstruction. EXAM: ABDOMEN - 2 VIEW COMPARISON:  None. FINDINGS: Nasogastric tube is in the stomach which is decompressed. Significant small bowel obstruction remains. The exact site of obstruction is not observed on these radiographs. There is no free air. IMPRESSION: Small bowel obstruction persists. Electronically Signed   By: Staci Righter M.D.   On: 12/02/2015 22:25   Dg Abd Portable 1v-small Bowel Obstruction Protocol-initial, 8 Hr Delay  12/02/2015  CLINICAL DATA:  24 hour small bowel protocol film, patient states abdomen still distended, suction on. EXAM: PORTABLE ABDOMEN - 1 VIEW COMPARISON:  12/01/2015 FINDINGS: Nasogastric tube is in place, tip overlying the level of the distal stomach. There has been interval decompression of the stomach. Small amount of contrast is identified the gastric fundus. There is persistent significant dilatation of numerous small bowel loops. Gas is identified in nondilated large bowel loops. No evidence for free intraperitoneal air on this supine views provided. Bowel sutures are identified in the right upper  quadrant the abdomen. Degenerative changes are seen in the spine and hips. IMPRESSION: 1. Persistent high-grade small bowel obstruction. 2. Interval decompression of the stomach by nasogastric tube. Electronically Signed   By: Nolon Nations M.D.   On: 12/02/2015 09:49   Dg  Abd Portable 1v-small Bowel Protocol-position Verification  12/01/2015  CLINICAL DATA:  Small bowel protocol, evaluate NGT. EXAM: PORTABLE ABDOMEN - 1 VIEW COMPARISON:  12/01/2015. FINDINGS: A gastric tube has been inserted. The last side hole is near the gastroesophageal junction. The tip of the tube lies within the stomach. The tube could be advanced several cm. There is marked gaseous distention of the stomach and small bowel. IMPRESSION: Nasogastric tube tip lies within the stomach but could be advanced several cm. Electronically Signed   By: Staci Righter M.D.   On: 12/01/2015 21:52     CBC  Recent Labs Lab 12/07/15 0820 12/09/15 0355 12/11/15 0412  WBC 7.9 8.8 11.6*  HGB 11.9* 11.2* 12.2*  HCT 36.8* 34.5* 36.8*  PLT 252 283 431*  MCV 108.6* 107.1* 106.4*  MCH 35.1* 34.8* 35.3*  MCHC 32.3 32.5 33.2  RDW 13.8 13.5 13.5    Chemistries   Recent Labs Lab 12/07/15 0820 12/08/15 0347 12/09/15 0355 12/10/15 0336 12/11/15 0412  NA 148* 146* 139 140 140  K 3.3* 3.3* 3.2* 3.7 3.6  CL 116* 114* 105 105 106  CO2 21* 25 23 25 25   GLUCOSE 152* 127* 127* 126* 131*  BUN 13 12 11 9 8   CREATININE 1.20 1.09 1.06 0.84 0.99  CALCIUM 7.6* 7.7* 7.3* 7.5* 7.7*  MG  --   --  1.7  --   --   AST  --  15  --   --   --   ALT  --  11*  --   --   --   ALKPHOS  --  57  --   --   --   BILITOT  --  2.2*  --   --   --    ------------------------------------------------------------------------------------------------------------------ estimated creatinine clearance is 93.1 mL/min (by C-G formula based on Cr of  0.99). ------------------------------------------------------------------------------------------------------------------ No results for input(s): HGBA1C in the last 72 hours. ------------------------------------------------------------------------------------------------------------------ No results for input(s): CHOL, HDL, LDLCALC, TRIG, CHOLHDL, LDLDIRECT in the last 72 hours. ------------------------------------------------------------------------------------------------------------------ No results for input(s): TSH, T4TOTAL, T3FREE, THYROIDAB in the last 72 hours.  Invalid input(s): FREET3 ------------------------------------------------------------------------------------------------------------------ No results for input(s): VITAMINB12, FOLATE, FERRITIN, TIBC, IRON, RETICCTPCT in the last 72 hours.  Coagulation profile No results for input(s): INR, PROTIME in the last 168 hours.  No results for input(s): DDIMER in the last 72 hours.  Cardiac Enzymes No results for input(s): CKMB, TROPONINI, MYOGLOBIN in the last 168 hours.  Invalid input(s): CK ------------------------------------------------------------------------------------------------------------------ Invalid input(s): POCBNP     Time Spent in minutes   25 Minutes   Ryley Bachtel M.D on 12/13/2015 at 12:58 PM  Between 7am to 7pm - Pager - [phone]  After 7pm go to [email or web address] - password [facility]  [facility]   Office  [phone]

## 2015-12-13 NOTE — Consult Note (Signed)
WOC wound follow up Wound type:surgical Measurement: 9cm x 3.5cm x 2cm Wound TKC:XWNP red, moist Drainage (amount, consistency, odor) scant serous Periwound:with erythema extending to 0.5cm from sponge resting on skin Dressing procedure/placement/frequency:foam cut to fit wound, drape applied and attached to continuous negative pressure of 179mHg.  An immediate seal is achieved and procedure tolerated well.   WOC ostomy follow up Stoma type/location: LLQ colostomy Stomal assessment/size: 1 and 1/2 x 1 and 5/8 inch oval. Budded, mucosal sloughing Peristomal assessment: intact, clear Treatment options for stomal/peristomal skin: skin barrier ring Output moderate amount of thin brown effluent Ostomy pouching: 1pc.flat, flexible pouch with skin barrier ring Education provided: None today [facility] in [facility]: No.  Cousin relays tentative discharge plan to SNF, perhaps mid-week.  [facility]ursing team will follow, and will remain available to this patient, the nursing, surgical and medical teams.  Please re-consult if needed. Thanks, LMaudie Flakes MSN, RN, [place] CArther Abbott Pager# [phone]

## 2015-12-13 NOTE — Progress Notes (Signed)
CSW continuing to follow.   CSW spoke with pt sister, [name] via telephone and provided SNF bed offers.   Pt sister plans to research options to make decision re: SNF. CSW clarified pt sisters questions and concerns.  CSW to follow up with pt sister regarding decision for SNF.  CSW to continue to follow to provide support and assist with pt discharge planning needs.   Alison Murray, MSW, [place] [employer] [phone]

## 2015-12-13 NOTE — Progress Notes (Signed)
Calorie Count Note  48 hour calorie count ordered.  Diet:  2/19:Full liquid diet 2/20: Soft diet  Supplements: Ensure Enlive po BID, each supplement provides 350 kcal and 20 grams of protein Boost Breeze po BID, each supplement provides 250 kcal and 9 grams of protein  Day 1 2/19-2/20: Breakfast (2/20): 47 kcal, 3g protein Lunch: 0% Dinner: 40 kcal, 0g protein Supplements: 175 kcal, 10g protein  Estimated Nutritional Needs:   Kcal: 1950-2150  Protein: 90-100g  Fluid: 2 L/day  Total intake: 262 kcal (13% of minimum estimated needs)  13 protein (14% of minimum estimated needs)  Nutrition Dx: Inadequate oral intake related to poor appetite, lethargy/confusion as evidenced by per patient/family report.  Goal: Patient will meet greater than or equal to 90% of their needs  Intervention:  -Continue Calorie Count. Day 2 results 2/21. -Continue Ensure Enlive po BID, each supplement provides 350 kcal and 20 grams of protein -Continue Boost Breeze po BID, each supplement provides 250 kcal and 9 grams of protein -RD to continue to monitor  Clayton Bibles, MS, RD, LDN Pager: [phone] After Hours Pager: [phone]

## 2015-12-14 DIAGNOSIS — R1310 Dysphagia, unspecified: Secondary | ICD-10-CM

## 2015-12-14 DIAGNOSIS — N179 Acute kidney failure, unspecified: Secondary | ICD-10-CM

## 2015-12-14 LAB — GLUCOSE, CAPILLARY
GLUCOSE-CAPILLARY: 105 mg/dL — AB (ref 65–99)
GLUCOSE-CAPILLARY: 108 mg/dL — AB (ref 65–99)
Glucose-Capillary: 130 mg/dL — ABNORMAL HIGH (ref 65–99)
Glucose-Capillary: 94 mg/dL (ref 65–99)

## 2015-12-14 MED ORDER — OXYCODONE HCL 5 MG PO TABS: 5.0000 mg | ORAL_TABLET | Freq: Four times a day (QID) | ORAL | Status: DC | PRN

## 2015-12-14 MED ORDER — BOOST / RESOURCE BREEZE PO LIQD: 1.0000 | Freq: Three times a day (TID) | ORAL | Status: DC

## 2015-12-14 MED ORDER — MAGIC MOUTHWASH: 15.0000 mL | Freq: Four times a day (QID) | ORAL | Status: DC | PRN

## 2015-12-14 MED ORDER — MORPHINE SULFATE (PF) 2 MG/ML IV SOLN: 1.0000 mg | INTRAVENOUS | Status: DC | PRN

## 2015-12-14 MED ORDER — ONDANSETRON HCL 4 MG PO TABS: 4.0000 mg | ORAL_TABLET | Freq: Four times a day (QID) | ORAL | Status: DC | PRN

## 2015-12-14 MED ORDER — ACETAMINOPHEN 325 MG PO TABS: 650.0000 mg | ORAL_TABLET | Freq: Four times a day (QID) | ORAL | Status: DC | PRN

## 2015-12-14 MED ORDER — ENSURE ENLIVE PO LIQD: 237.0000 mL | Freq: Three times a day (TID) | ORAL | Status: DC

## 2015-12-14 NOTE — Discharge Instructions (Signed)
ABDOMINAL SURGERY: POST OP INSTRUCTIONS  1. DIET: Follow a light bland diet the first 24 hours after arrival home, such as soup, liquids, crackers, etc.  Be sure to include lots of fluids daily.  Avoid fast food or heavy meals as your are more likely to get nauseated.  Eat a low fat the next few days after surgery.   2. Take your usually prescribed home medications unless otherwise directed. 3. PAIN CONTROL: a. Pain is best controlled by a usual combination of three different methods TOGETHER: i. Ice/Heat ii. Over the counter pain medication iii. Prescription pain medication b. Most patients will experience some swelling and bruising around the incisions.  Ice packs or heating pads (30-60 minutes up to 6 times a day) will help. Use ice for the first few days to help decrease swelling and bruising, then switch to heat to help relax tight/sore spots and speed recovery.  Some people prefer to use ice alone, heat alone, alternating between ice & heat.  Experiment to what works for you.  Swelling and bruising can take several weeks to resolve.   c. It is helpful to take an over-the-counter pain medication regularly for the first few weeks.  Choose one of the following that works best for you: i. Naproxen (Aleve, etc)  Two 239m tabs twice a day ii. Ibuprofen (Advil, etc) Three 208mtabs four times a day (every meal & bedtime) iii. Acetaminophen (Tylenol, etc) 500-65083mour times a day (every meal & bedtime) d. A  prescription for pain medication (such as oxycodone, hydrocodone, etc) should be given to you upon discharge.  Take your pain medication as prescribed.  i. If you are having problems/concerns with the prescription medicine (does not control pain, nausea, vomiting, rash, itching, etc), please call us [phone] see if we need to switch you to a different pain medicine that will work better for you and/or control your side effect better. ii. If you need a refill on your pain medication,  please contact your pharmacy.  They will contact our office to request authorization. Prescriptions will not be filled after 5 pm or on week-ends. 4. Avoid getting constipated.  Between the surgery and the pain medications, it is common to experience some constipation.  Increasing fluid intake and taking a fiber supplement (such as Metamucil, Citrucel, FiberCon, MiraLax, etc) 1-2 times a day regularly will usually help prevent this problem from occurring.  A mild laxative (prune juice, Milk of Magnesia, MiraLax, etc) should be taken according to package directions if there are no bowel movements after 48 hours.   5. Watch out for diarrhea.  If you have many loose bowel movements, simplify your diet to bland foods & liquids for a few days.  Stop any stool softeners and decrease your fiber supplement.  Switching to mild anti-diarrheal medications (Kayopectate, Pepto Bismol) can help.  If this worsens or does not improve, please call us.[place]. Wash / shower every day.  You may shower over the incision / wound.  Avoid baths until the skin is fully healed.  Continue to shower over incision(s) after the dressing is off. 7. Remove your waterproof bandages 5 days after surgery.  You may leave the incision open to air.  You may replace a dressing/Band-Aid to cover the incision for comfort if you wish. 8. ACTIVITIES as tolerated:   a. You may resume regular (light) daily activities beginning the next day--such as daily self-care, walking, climbing stairs--gradually increasing activities as tolerated.  If you can  walk 30 minutes without difficulty, it is safe to try more intense activity such as jogging, treadmill, bicycling, low-impact aerobics, swimming, etc. b. Save the most intensive and strenuous activity for last such as sit-ups, heavy lifting, contact sports, etc  Refrain from any heavy lifting or straining until you are off narcotics for pain control.   c. DO NOT PUSH THROUGH PAIN.  Let pain be your guide: If it  hurts to do something, don't do it.  Pain is your body warning you to avoid that activity for another week until the pain goes down. d. You may drive when you are no longer taking prescription pain medication, you can comfortably wear a seatbelt, and you can safely maneuver your car and apply brakes. e. Dennis Bast may have sexual intercourse when it is comfortable.  9. FOLLOW UP in our office a. Please call [facility] at [phone] to set up an appointment to see your surgeon in the office for a follow-up appointment approximately 1-2 weeks after your surgery. b. Make sure that you call for this appointment the day you arrive home to insure a convenient appointment time. 10. IF YOU HAVE DISABILITY OR FAMILY LEAVE FORMS, BRING THEM TO THE OFFICE FOR PROCESSING.  DO NOT GIVE THEM TO YOUR DOCTOR.   WHEN TO CALL us [phone]: 1. Poor pain control 2. Reactions / problems with new medications (rash/itching, nausea, etc)  3. Fever over 101.5 F (38.5 C) 4. Inability to urinate 5. Nausea and/or vomiting 6. Worsening swelling or bruising 7. Continued bleeding from incision. 8. Increased pain, redness, or drainage from the incision  The clinic staff is available to answer your questions during regular business hours (8:30am-5pm).  Please dont hesitate to call and ask to speak to one of our nurses for clinical concerns.   A surgeon from [facility] is always on call at the hospitals   If you have a medical emergency, go to the nearest emergency room or call 911.    [facility], [place], [place], [place],   [address] ? MAIN: [phone] ? TOLL FREE: [phone] ? FAX [phone] [email or web address]

## 2015-12-14 NOTE — Care Management Important Message (Signed)
Important Message  Patient Details  Name: [name] MRN: [id number] Date of Birth: [birth year]   Medicare Important Message Given:  Yes    [name] 12/14/2015, 12:50 [place] Message  Patient Details  Name: [name] MRN: [id number] Date of Birth: 07/18/1956   Medicare Important Message Given:  Yes    [name] 12/14/2015, 12:50 PM

## 2015-12-14 NOTE — NC FL2 (Signed)
[place] LEVEL OF CARE SCREENING TOOL     IDENTIFICATION  Patient Name: [name] Birthdate: 04-Mar-1956 Sex: male Admission Date (Current Location): 12/01/2015  [facility] and [place] Number:  Herbalist and Address:  [facility],  [place] [address] [place]      Provider Number: [id number]  Attending Physician Name and Address:  Albertine Patricia, MD  Relative Name and Phone Number:       Current Level of Care: Hospital Recommended Level of Care: [place] Prior Approval Number:    Date Approved/Denied:   PASRR Number: [id number]  Discharge Plan: SNF    Current Diagnoses: Patient Active Problem List   Diagnosis Date Noted  . Crohn's disease of both small and large intestine with fistula ([place])   . SBO (small bowel obstruction) ([place]) 12/01/2015  . S/P partial colectomy for Crohn's disease 12/01/2015  . ARF (acute renal failure) ([place]) 12/01/2015  . Shock ([place]) 12/01/2015  . SIRS (systemic inflammatory response syndrome) ([place]) 12/01/2015  . Localization-related idiopathic epilepsy and epileptic syndromes with seizures of localized onset, not intractable, without status epilepticus ([place]) 03/30/2015  . Aortic valve stenosis, mild 03/31/2014  . Aortic valve regurgitation 03/31/2014  . Pseudopolyposis of colon ([place]) 01/03/2012  . Crohn's disease ([place]) 12/19/2011  . Personal history of immunosupression therapy 12/19/2011  . Seizure disorder ([place]) 12/19/2011  . ECZEMA 08/21/2008  . CHOLELITHIASIS, ASYMPTOMATIC 01/29/2008  . VITAMIN B12 DEFICIENCY 01/17/2008    Orientation RESPIRATION BLADDER Height & Weight     Self, Time, Situation, Place  Normal Incontinent Weight: 210 lb 15.7 oz (95.7 kg) Height:  5' 10"  (177.8 cm)  BEHAVIORAL SYMPTOMS/MOOD NEUROLOGICAL BOWEL NUTRITION STATUS   (n/a) Convulsions/Seizures (seizure disorder, on keppra) Colostomy (LLQ colostomy, see additional comments for care) Diet  (Dysphagia 2 with thin liquid diet , with feeding assistance and aspiration precautions.)  AMBULATORY STATUS COMMUNICATION OF NEEDS Skin   Extensive Assist Verbally Surgical wounds (wet-to-dry dressing on a daily basis )                       Personal Care Assistance Level of Assistance  Bathing, Dressing, Feeding Bathing Assistance: Limited assistance Feeding assistance: Limited assistance (feeding assistance and aspiration precautions.) Dressing Assistance: Limited assistance     Functional Limitations Info  Sight Sight Info: Impaired        SPECIAL CARE FACTORS FREQUENCY  Speech therapy     PT Frequency: 5 x a week OT Frequency: 5 x a week     Speech Therapy Frequency: 1 x a  week      Contractures Contractures Info: Not present    Additional Factors Info  Code Status Code Status Info: FULL code status Allergies Info: No Known Allergies           Current Medications (12/14/2015):  This is the current hospital active medication list Current Facility-Administered Medications  Medication Dose Route Frequency Provider Last Rate Last Dose  . acetaminophen (TYLENOL) suppository 650 mg  650 mg Rectal Q6H PRN Michael Boston, MD      . acetaminophen (TYLENOL) tablet 650 mg  650 mg Oral Q6H PRN Rise Patience, MD   650 mg at 12/05/15 7414   Or  . acetaminophen (TYLENOL) suppository 650 mg  650 mg Rectal Q6H PRN Rise Patience, MD      . alum & mag hydroxide-simeth (MAALOX/MYLANTA) 200-200-20 MG/5ML suspension 30 mL  30 mL Oral Q6H PRN  Michael Boston, MD      . antiseptic oral rinse (CPC / CETYLPYRIDINIUM CHLORIDE 0.05%) solution 7 mL  7 mL Mouth Rinse BID Debbe Odea, MD   7 mL at 12/13/15 2215  . bisacodyl (DULCOLAX) suppository 10 mg  10 mg Rectal Q12H PRN Michael Boston, MD      . dextrose 5 %-0.45 % sodium chloride infusion   Intravenous Continuous Greer Pickerel, MD 50 mL/hr at 12/13/15 1650    . dextrose 50 % solution 50 mL  1 ampule Intravenous Once Debbe Odea, MD   50 mL at 12/02/15 0815  . diphenhydrAMINE (BENADRYL) injection 12.5-25 mg  12.5-25 mg Intravenous Q6H PRN Michael Boston, MD   25 mg at 12/12/15 2339  . feeding supplement (BOOST / RESOURCE BREEZE) liquid 1 Container  1 Container Oral BID BM Greer Pickerel, MD   1 Container at 12/11/15 1400  . feeding supplement (ENSURE ENLIVE) (ENSURE ENLIVE) liquid 237 mL  237 mL Oral BID BM Silver Huguenin Elgergawy, MD   237 mL at 12/12/15 1436  . heparin injection 5,000 Units  5,000 Units Subcutaneous 3 times per day Debbe Odea, MD   5,000 Units at 12/14/15 0610  . levETIRAcetam (KEPPRA) 750 mg in sodium chloride 0.9 % 100 mL IVPB  750 mg Intravenous Q12H Gardiner Barefoot, NP   750 mg at 12/13/15 2215  . lip balm (CARMEX) ointment 1 application  1 application Topical BID Michael Boston, MD   1 application at 63/89/37 2215  . magic mouthwash  15 mL Oral QID PRN Michael Boston, MD      . menthol-cetylpyridinium (CEPACOL) lozenge 3 mg  1 lozenge Oral PRN Michael Boston, MD      . morphine 2 MG/ML injection 1-3 mg  1-3 mg Intravenous Q1H PRN Emina Riebock, NP      . neomycin-bacitracin-polymyxin (NEOSPORIN) ointment   Topical Daily Debbe Odea, MD      . ondansetron (ZOFRAN) injection 4 mg  4 mg Intravenous Q6H PRN Michael Boston, MD       Or  . ondansetron (ZOFRAN) 8 mg in sodium chloride 0.9 % 50 mL IVPB  8 mg Intravenous Q6H PRN Michael Boston, MD      . ondansetron (ZOFRAN) tablet 4 mg  4 mg Oral Q6H PRN Rise Patience, MD       Or  . ondansetron [facility]) injection 4 mg  4 mg Intravenous Q6H PRN Rise Patience, MD      . oxyCODONE (Oxy IR/ROXICODONE) immediate release tablet 5-10 mg  5-10 mg Oral Q4H PRN Greer Pickerel, MD      . phenol [facility]) mouth spray 2 spray  2 spray Mouth/Throat PRN Michael Boston, MD   2 spray at 12/02/15 1418  . promethazine (PHENERGAN) injection 6.25 mg  6.25 mg Intravenous Q4H PRN Albertine Patricia, MD         Discharge Medications: Please see discharge summary for  a list of discharge medications.  Relevant Imaging Results:  Relevant Lab Results:   Additional Information SSN [id number]. LLQ colostomy-Empty pouch when 1/3 to  full of stool/flatus, Clean bottom 2-inches of fecal pouch prior to resealing,Assist patient in emptying pouch, Change pouch twice weekly and PRN. Write date of pouch application on pouch.,Have spare pouch at bedside at all times, Use Ostomy Teaching Booklet (provided by [facility] nurse) for teaching/reinforcing patient and caregiver education regarding ostomy. Order Supplies: [facility] # [id number] ostomy pouch/ Kellie Simmering # [phone] 2" barrier ring.  Jackilyn Umphlett A, LCSW

## 2015-12-14 NOTE — Progress Notes (Signed)
Physical Therapy Treatment Patient Details Name: [name] MRN: [id number] DOB: [birth year] Today's Date: 12/14/2015    History of Present Illness Laparoscopic enterolysis of adhesions (45 minutes), laparoscopic-assisted small bowel resection, hand assisted left hemicolectomy with mobilization of splenic flexure, and left transverse colon end colostomy     PT Comments    Pt transferred to recliner with some impulsivity noted. Pt reports he is nervous and refused ambulation, but feel he could have ambulated short distance in room. Con't to recommend SNF.  Follow Up Recommendations  SNF;Supervision/Assistance - 24 hour     Equipment Recommendations  Rolling walker with 5" wheels    Recommendations for Other Services       Precautions / Restrictions Precautions Precautions: Fall Precaution Comments: colostomy, wound vac,mentally challenged, from ALF, NG suction Restrictions Weight Bearing Restrictions: No    Mobility  Bed Mobility Overal bed mobility: Needs Assistance Bed Mobility: Rolling;Sidelying to Sit Rolling: Min assist Sidelying to sit: Min assist       General bed mobility comments: cue for proper body mechanics  Transfers Overall transfer level: Needs assistance Equipment used: Rolling walker (2 wheeled) Transfers: Sit to/from [facility] Sit to Stand: Mod assist;+2 physical assistance Stand pivot transfers: Mod assist;+2 physical assistance       General transfer comment: tremors noted and pt anxious about transfer.  Cues for stand to sit and not to plop.  Slightly impulsive.  Ambulation/Gait             General Gait Details: Pt refused, but felt he could have ambulated short distance in room.   Stairs            Wheelchair Mobility    Modified Rankin (Stroke Patients Only)       Balance   Sitting-balance support: Feet supported Sitting balance-Leahy Scale: Good       Standing balance-Leahy Scale:  Poor Standing balance comment: requires UE support and tremors noted                    Cognition Arousal/Alertness: Awake/alert Behavior During Therapy: WFL for tasks assessed/performed Overall Cognitive Status: History of cognitive impairments - at baseline                      Exercises      General Comments General comments (skin integrity, edema, etc.): no family present today. Pt impulsive at times and appears anxious about working with PT, but agreeable, but not agreeable to ambulation, although think he could have ambulated short distance in the room.      Pertinent Vitals/Pain Pain Assessment: No/denies pain    Home Living                      Prior Function            PT Goals (current goals can now be found in the care plan section) Acute Rehab PT Goals Patient Stated Goal: to go back to ALF to be near his mother-per family Time For Goal Achievement: 12/21/15 Potential to Achieve Goals: Good Progress towards PT goals: Progressing toward goals    Frequency  Min 3X/week    PT Plan Current plan remains appropriate    Co-evaluation             End of Session Equipment Utilized During Treatment: Gait belt Activity Tolerance: Patient limited by lethargy Patient left: in chair;with call bell/phone within reach;with chair alarm set  Time: 7255-0016 PT Time Calculation (min) (ACUTE ONLY): 12 min  Charges:  $Therapeutic Activity: 8-22 mins                    G Codes:      [place] LUBECK 12/14/2015, 10:46 AM

## 2015-12-14 NOTE — Progress Notes (Addendum)
Speech Language Pathology Treatment: Dysphagia  Patient Details Name: [name] MRN: [id number] DOB: 05-10-1956 Today's Date: 12/14/2015 Time: 6063-0160 SLP Time Calculation (min) (ACUTE ONLY): 9 min  Assessment / Plan / Recommendation Clinical Impression  [name],pt's cousin, arrived and inquired to this SLP regarding pt's dysphagia and plan.  SLP reviewed with [name] suspicion of possible chronic dysphagia that pt managed prior to admission c/b his chronic throat clearing.  Dysphagia exacerbation suspected due to deconditioning.  In addition, reviewed aspiration mitigation strategies- advised SLP could not prevent aspiration - education provided verbally and in writing.    Pt states he became full quickly pointing to distal esophagus after few bites of jello and soup yesterday prompting him to stop intake- causing SLP to suspect esophageal component.  During clinical swallow evaluation, pt reported sensation of pharyngeal - cervical esophageal residuals- not distal esophagus.  Regardless, he says he can sense when residuals clear allowing him to consume more.     Educated pt and [name] to suspected aspiration and encouraged him to consume Ensure throughout the day for nutrition, assuring he clears adequately.  Items of increased viscosity appear harder to clear for pt.   Pt inquired to source of dysphagia, suspect deconditioning exacerbating deficits. Hopeful for continued improvement with swallowing as pt becomes more conditioned and receives adequate nutrition.  Encouraged pt to use incentive spirometer for maximal lung aeration.    Follow up at [facility] for dysphagia management recommended.  Pt and [name] agree to plan.  Thanks.    HPI HPI: 60 yo adm to [facility] with nausea vomiting.  Pt found to have SBO, is s/p surgical repair - has wound vac.   Pt with h/o seizures, cognitive deficits, ARF, Asp pna, dehydration.  RN note pt coughing with intake and requested SLP swallow evaluation.         SLP Plan  Continue with current plan of care     Recommendations  Diet recommendations:  (NPO vs po with accepted risks and mitigations) Medication Administration: Other (Comment) (crushed with applesauce) Compensations: Small sips/bites;Slow rate;Other (Comment);Multiple dry swallows after each bite/sip (cough and expectorate prn)             Oral Care Recommendations: Oral care BID Follow up Recommendations: Skilled Nursing facility Plan: Continue with current plan of care     [place], [place], [place] [facility] SLP [phone]

## 2015-12-14 NOTE — Progress Notes (Signed)
Patient ID: [name], male   DOB: [birth year], 60 y.o.   MRN: [id number]     [address] [place], [place] [address]    Phone: [phone] FAX: [phone]     Subjective: No n/v.  Tolerating POs.  Appetite is okay, likes ensure.   Objective:  Vital signs:  Filed Vitals:   12/13/15 0410 12/13/15 1229 12/13/15 2109 12/14/15 0539  BP: 127/60 132/64 127/61 132/65  Pulse: 87 90 86 94  Temp: 98.3 F (36.8 C) 98.7 F (37.1 C) 98.2 F (36.8 C) 98 F (36.7 C)  TempSrc: Oral Oral Oral Oral  Resp: 20 20 20 20   Height:      Weight:    95.7 kg (210 lb 15.7 oz)  SpO2: 93% 94% 95% 97%    Last BM Date: 12/13/15  Intake/Output   Yesterday:  02/20 0701 - 02/21 0700 In: 100 [P.O.:100] Out: 1800 [Urine:1500; Stool:300] This shift:     Physical Exam: General: Pt awake/alert/oriented to person. No acute distress  Abdomen: Soft. Nondistended.non tender. Midline wound-vac in place. Ostomy superficial dark, but viable underneath. stool in bag.   Problem List:   Principal Problem:   SBO (small bowel obstruction) (HCC) Active Problems:   Crohn's disease ([place])   Personal history of immunosupression therapy   Seizure disorder ([place])   S/P partial colectomy for Crohn's disease   ARF (acute renal failure) ([place])   SIRS (systemic inflammatory response syndrome) ([place])   Crohn's disease of both small and large intestine with fistula ([place])    Results:   Labs: Results for orders placed or performed during the hospital encounter of 12/01/15 (from the past 48 hour(s))  Glucose, capillary     Status: Abnormal   Collection Time: 12/12/15 12:12 PM  Result Value Ref Range   Glucose-Capillary 126 (H) 65 - 99 mg/dL  Glucose, capillary     Status: Abnormal   Collection Time: 12/12/15  4:20 PM  Result Value Ref Range   Glucose-Capillary 107 (H) 65 - 99 mg/dL  Glucose, capillary     Status: Abnormal   Collection  Time: 12/12/15  7:42 PM  Result Value Ref Range   Glucose-Capillary 108 (H) 65 - 99 mg/dL  Glucose, capillary     Status: Abnormal   Collection Time: 12/13/15 12:13 AM  Result Value Ref Range   Glucose-Capillary 105 (H) 65 - 99 mg/dL   Comment 1 Notify RN   Glucose, capillary     Status: None   Collection Time: 12/13/15  4:12 AM  Result Value Ref Range   Glucose-Capillary 94 65 - 99 mg/dL  Glucose, capillary     Status: Abnormal   Collection Time: 12/13/15  7:38 AM  Result Value Ref Range   Glucose-Capillary 104 (H) 65 - 99 mg/dL  Glucose, capillary     Status: None   Collection Time: 12/13/15 12:22 PM  Result Value Ref Range   Glucose-Capillary 97 65 - 99 mg/dL  Glucose, capillary     Status: None   Collection Time: 12/13/15  5:06 PM  Result Value Ref Range   Glucose-Capillary 99 65 - 99 mg/dL   Comment 1 Notify RN   Glucose, capillary     Status: None   Collection Time: 12/13/15  8:08 PM  Result Value Ref Range   Glucose-Capillary 98 65 - 99 mg/dL   Comment 1 Notify RN   Glucose, capillary  Status: None   Collection Time: 12/14/15 12:04 AM  Result Value Ref Range   Glucose-Capillary 94 65 - 99 mg/dL  Glucose, capillary     Status: Abnormal   Collection Time: 12/14/15  4:06 AM  Result Value Ref Range   Glucose-Capillary 105 (H) 65 - 99 mg/dL   Comment 1 Notify RN   Glucose, capillary     Status: Abnormal   Collection Time: 12/14/15  7:29 AM  Result Value Ref Range   Glucose-Capillary 130 (H) 65 - 99 mg/dL   Comment 1 Notify RN     Imaging / Studies: No results found.  Medications / Allergies:  Scheduled Meds: . antiseptic oral rinse  7 mL Mouth Rinse BID  . dextrose  1 ampule Intravenous Once  . feeding supplement  1 Container Oral BID BM  . feeding supplement (ENSURE ENLIVE)  237 mL Oral BID BM  . heparin subcutaneous  5,000 Units Subcutaneous 3 times per day  . levETIRAcetam  750 mg Intravenous Q12H  . lip balm  1 application Topical BID  .  neomycin-bacitracin-polymyxin   Topical Daily   Continuous Infusions: . dextrose 5 % and 0.45% NaCl 50 mL/hr at 12/13/15 1650   PRN Meds:.acetaminophen, acetaminophen **OR** acetaminophen, alum & mag hydroxide-simeth, bisacodyl, diphenhydrAMINE, magic mouthwash, menthol-cetylpyridinium, morphine injection, ondansetron (ZOFRAN) IV **OR** ondansetron (ZOFRAN) IV, ondansetron **OR** ondansetron (ZOFRAN) IV, oxyCODONE, phenol, promethazine  Antibiotics: Anti-infectives    Start     Dose/Rate Route Frequency Ordered Stop   12/07/15 1500  piperacillin-tazobactam (ZOSYN) IVPB 3.375 g  Status:  Discontinued     3.375 g 12.5 mL/hr over 240 Minutes Intravenous Every 8 hours 12/07/15 1455 12/11/15 1333   12/04/15 2000  vancomycin (VANCOCIN) 1,250 mg in sodium chloride 0.9 % 250 mL IVPB  Status:  Discontinued     1,250 mg 166.7 mL/hr over 90 Minutes Intravenous Every 12 hours 12/04/15 1615 12/05/15 0759   12/02/15 2000  vancomycin (VANCOCIN) IVPB 750 mg/150 ml premix  Status:  Discontinued     750 mg 150 mL/hr over 60 Minutes Intravenous Every 12 hours 12/02/15 0735 12/04/15 1614   12/02/15 1400  piperacillin-tazobactam (ZOSYN) IVPB 3.375 g  Status:  Discontinued     3.375 g 12.5 mL/hr over 240 Minutes Intravenous 3 times per day 12/02/15 0459 12/05/15 0759   12/02/15 0800  vancomycin (VANCOCIN) 2,000 mg in sodium chloride 0.9 % 500 mL IVPB     2,000 mg 250 mL/hr over 120 Minutes Intravenous  Once 12/02/15 0730 12/02/15 1043   12/02/15 0500  piperacillin-tazobactam (ZOSYN) IVPB 3.375 g     3.375 g 100 mL/hr over 30 Minutes Intravenous  Once 12/02/15 0457 12/02/15 1007        Assessment/Plan: Crohns disease Small bowel obstruction secondary to small bowel(entero)-left colonic fistula to the mid left colon  S/p Laparoscopic enterolysis of adhesions (45 minutes), laparoscopic-assisted small bowel resection, hand assisted left hemicolectomy with mobilization of splenic flexure, and left  transverse colon end colostomy with Renato Shin, 12/04/15, Dr. Lucia Gaskins -tolerating POs, pain controlled, ostomy functioning.  - wound vac, Changes M-W-F -mobilize FEN-soft diet VTE prophylaxis-SCD/heparin Dispo-stable for SNF.  Follow up arranged   Erby Pian, ANP-BC [facility] Pager [phone](7A-4:30P)   12/14/2015 8:35 AM

## 2015-12-14 NOTE — Progress Notes (Signed)
Calorie Count Note  ** Spoke with SLP, per evaluation pt with severe dysphagia and severe aspiration risk. MD states patient is not a candidate for PEG and surgery is stating pt is not a surgical candidate d/t recent hemicolectomy. Pt is at risk for developing malnutrition as he is unable to take in adequate POs. Nutrition support may provide more benefits. Less aspiration risk with post-pyloric tube.  Recommended diet for facility per SLP, dysphagia 2 with thin liquids with mitigation. Follow-up recommended at SNF.  48 hour calorie count ordered. Day 2 results below  Diet: Soft diet  Supplements: Ensure Enlive po BID, each supplement provides 350 kcal and 20 grams of protein Boost Breeze po BID, each supplement provides 250 kcal and 9 grams of protein  Day 2, 2/21: Breakfast : 0% Lunch: 40 kcal, 0g protein  Dinner: 0% Supplements: 175 kcal, 10g protein  Estimated Nutritional Needs:   Kcal: 1950-2150  Protein: 90-100g  Fluid: 2 L/day  Total intake: 215 kcal (11% of minimum estimated needs)  10 protein (11% of minimum estimated needs)  Nutrition Dx: Inadequate oral intake related to poor appetite, lethargy/confusion as evidenced by per patient/family report.  Goal: Patient will meet greater than or equal to 90% of their needs  Intervention:  -Continue Ensure Enlive po BID, each supplement provides 350 kcal and 20 grams of protein -D/c Boost Breeze -RD to continue to monitor  Clayton Bibles, MS, RD, LDN Pager: [phone] After Hours Pager: [phone]

## 2015-12-14 NOTE — NC FL2 (Deleted)
[place] LEVEL OF CARE SCREENING TOOL     IDENTIFICATION  Patient Name: GURINDER TORAL Birthdate: [birth year] Sex: male Admission Date (Current Location): 12/01/2015  [facility] and [place] Number:  Herbalist and Address:  [facility],  [place] [address] [place]      Provider Number: [id number]  Attending Physician Name and Address:  Albertine Patricia, MD  Relative Name and Phone Number:       Current Level of Care: Hospital Recommended Level of Care: [place] Prior Approval Number:    Date Approved/Denied:   PASRR Number: [id number]  Discharge Plan: SNF    Current Diagnoses: Patient Active Problem List   Diagnosis Date Noted  . 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 SBO (small bowel obstruction) ([place]) 12/01/2015  . S/P partial colectomy for Crohn's disease 12/01/2015  . ARF (acute renal failure) ([place]) 12/01/2015  . Shock ([place]) 12/01/2015  . SIRS (systemic inflammatory response syndrome) ([place]) 12/01/2015  . Localization-related idiopathic epilepsy and epileptic syndromes with seizures of localized onset, not intractable, without status epilepticus ([place]) 03/30/2015  . Aortic valve stenosis, mild 03/31/2014  . Aortic valve regurgitation 03/31/2014  . Pseudopolyposis of colon ([place]) 01/03/2012  . Crohn's disease ([place]) 12/19/2011  . Personal history of immunosupression therapy 12/19/2011  . Seizure disorder ([place]) 12/19/2011  . ECZEMA 08/21/2008  . CHOLELITHIASIS, ASYMPTOMATIC 01/29/2008  . VITAMIN B12 DEFICIENCY 01/17/2008    Orientation RESPIRATION BLADDER Height & Weight     Self, Time, Situation, Place  Normal Incontinent Weight: 210 lb 15.7 oz (95.7 kg) Height:  5' 10"  (177.8 cm)  BEHAVIORAL SYMPTOMS/MOOD NEUROLOGICAL BOWEL NUTRITION STATUS   (n/a) Convulsions/Seizures (seizure disorder, on keppra) Colostomy (LLQ colostomy, see additional comments for care) Diet  (Dysphagia 2 with thin liquid diet , with feeding assistance and aspiration precautions.)  AMBULATORY STATUS COMMUNICATION OF NEEDS Skin   Extensive Assist Verbally Surgical wounds (wet-to-dry dressing on a daily basis )                       Personal Care Assistance Level of Assistance  Bathing, Dressing, Feeding Bathing Assistance: Limited assistance Feeding assistance: Limited assistance (feeding assistance and aspiration precautions.) Dressing Assistance: Limited assistance     Functional Limitations Info  Sight Sight Info: Impaired        SPECIAL CARE FACTORS FREQUENCY  PT (By licensed PT), OT (By licensed OT)     PT Frequency: 5 x a week OT Frequency: 5 x a week            Contractures Contractures Info: Not present    Additional Factors Info  Code Status Code Status Info: FULL code status Allergies Info: No Known Allergies           Current Medications (12/14/2015):  This is the current hospital active medication list Current Facility-Administered Medications  Medication Dose Route Frequency Provider Last Rate Last Dose  . acetaminophen (TYLENOL) suppository 650 mg  650 mg Rectal Q6H PRN Michael Boston, MD      . acetaminophen (TYLENOL) tablet 650 mg  650 mg Oral Q6H PRN Rise Patience, MD   650 mg at 12/05/15 5320   Or  . acetaminophen (TYLENOL) suppository 650 mg  650 mg Rectal Q6H PRN Rise Patience, MD      . alum & mag hydroxide-simeth (MAALOX/MYLANTA) 200-200-20 MG/5ML suspension 30 mL  30 mL Oral Q6H PRN  Michael Boston, MD      . antiseptic oral rinse (CPC / CETYLPYRIDINIUM CHLORIDE 0.05%) solution 7 mL  7 mL Mouth Rinse BID Debbe Odea, MD   7 mL at 12/13/15 2215  . bisacodyl (DULCOLAX) suppository 10 mg  10 mg Rectal Q12H PRN Michael Boston, MD      . dextrose 5 %-0.45 % sodium chloride infusion   Intravenous Continuous Greer Pickerel, MD 50 mL/hr at 12/13/15 1650    . dextrose 50 % solution 50 mL  1 ampule Intravenous Once Debbe Odea, MD    50 mL at 12/02/15 0815  . diphenhydrAMINE (BENADRYL) injection 12.5-25 mg  12.5-25 mg Intravenous Q6H PRN Michael Boston, MD   25 mg at 12/12/15 2339  . feeding supplement (BOOST / RESOURCE BREEZE) liquid 1 Container  1 Container Oral BID BM Greer Pickerel, MD   1 Container at 12/11/15 1400  . feeding supplement (ENSURE ENLIVE) (ENSURE ENLIVE) liquid 237 mL  237 mL Oral BID BM Silver Huguenin Elgergawy, MD   237 mL at 12/12/15 1436  . heparin injection 5,000 Units  5,000 Units Subcutaneous 3 times per day Debbe Odea, MD   5,000 Units at 12/14/15 0610  . levETIRAcetam (KEPPRA) 750 mg in sodium chloride 0.9 % 100 mL IVPB  750 mg Intravenous Q12H Gardiner Barefoot, NP   750 mg at 12/13/15 2215  . lip balm (CARMEX) ointment 1 application  1 application Topical BID Michael Boston, MD   1 application at 34/74/25 2215  . magic mouthwash  15 mL Oral QID PRN Michael Boston, MD      . menthol-cetylpyridinium (CEPACOL) lozenge 3 mg  1 lozenge Oral PRN Michael Boston, MD      . morphine 2 MG/ML injection 1-3 mg  1-3 mg Intravenous Q1H PRN Emina Riebock, NP      . neomycin-bacitracin-polymyxin (NEOSPORIN) ointment   Topical Daily Debbe Odea, MD      . ondansetron (ZOFRAN) injection 4 mg  4 mg Intravenous Q6H PRN Michael Boston, MD       Or  . ondansetron (ZOFRAN) 8 mg in sodium chloride 0.9 % 50 mL IVPB  8 mg Intravenous Q6H PRN Michael Boston, MD      . ondansetron (ZOFRAN) tablet 4 mg  4 mg Oral Q6H PRN Rise Patience, MD       Or  . ondansetron [facility]) injection 4 mg  4 mg Intravenous Q6H PRN Rise Patience, MD      . oxyCODONE (Oxy IR/ROXICODONE) immediate release tablet 5-10 mg  5-10 mg Oral Q4H PRN Greer Pickerel, MD      . phenol [facility]) mouth spray 2 spray  2 spray Mouth/Throat PRN Michael Boston, MD   2 spray at 12/02/15 1418  . promethazine (PHENERGAN) injection 6.25 mg  6.25 mg Intravenous Q4H PRN Albertine Patricia, MD         Discharge Medications: Please see discharge summary for a list of  discharge medications.  Relevant Imaging Results:  Relevant Lab Results:   Additional Information SSN [id number]. LLQ colostomy-Empty pouch when 1/3 to  full of stool/flatus, Clean bottom 2-inches of fecal pouch prior to resealing,Assist patient in emptying pouch, Change pouch twice weekly and PRN. Write date of pouch application on pouch.,Have spare pouch at bedside at all times, Use Ostomy Teaching Booklet (provided by [facility] nurse) for teaching/reinforcing patient and caregiver education regarding ostomy. Order Supplies: [facility] # [id number] ostomy pouch/ Kellie Simmering # [id number] 2" barrier ring.  Sally-Ann Cutbirth A, LCSW
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
## 2015-12-14 NOTE — Discharge Summary (Signed)
[name], is a 60 y.o. male  DOB 06-27-1956  MRN [id number].  Admission date:  12/01/2015  Admitting Physician  Rise Patience, MD  Discharge Date:  12/14/2015   Primary MD  No PCP Per Patient  Recommendations for primary care physician for things to follow:  - Patient to be seen by is in a physician in 3 days. - This check CBC, BMP in 3 days - Patient on dysphagia 2 diet with thin liquid, need to be seen and followed by speech language pathology service at facility within 3 days regarding further diet recommendations. - Encourage nutritional supplement intake - Patient will need intertriginous consult at facility   Admission Diagnosis  Small bowel obstruction ([place]) [K56.69] Encounter for imaging study to confirm nasogastric (NG) tube placement [Z01.89] Abdominal pain [R10.9]   Discharge Diagnosis  Small bowel obstruction ([place]) [K56.69] Encounter for imaging study to confirm nasogastric (NG) tube placement [Z01.89] Abdominal pain [R10.9]    Principal Problem:   SBO (small bowel obstruction) ([place]) Active Problems:   Crohn's disease ([place])   Personal history of immunosupression therapy   Seizure disorder ([place])   S/P partial colectomy for Crohn's disease   ARF (acute renal failure) ([place])   SIRS (systemic inflammatory response syndrome) ([place])   Crohn's disease of both small and large intestine with fistula ([place])      Past Medical History  Diagnosis Date  . Other B-complex deficiencies   . Iron deficiency anemia, unspecified   . Abscess of anal and rectal regions   . Crohn's disease ([place])   . Mental disability   . Heart murmur   . History of blood transfusion ~ 1992    "related to my Crohn's"  . History of stomach ulcers   . Epilepsy ([place])     "petit mal; only at home" (6/9//2015)  . Syncope and collapse 03/31/2014    "while driving"  . Hyperplastic colon polyp   . REGIONAL  ENTERITIS, LARGE INTESTINE 10/05/2000    Qualifier: Diagnosis of  By: Jerral Ralph    . Syncope 03/31/2014  . 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 Hemicolectomy  1985    ileocectomy   . Colonoscopy  2013, 2016  . Umbilical hernia repair  ~ 1983  . Tonsillectomy  ~ 1967  . Hernia repair    . Colon surgery      Anastomosis 45cm from anal verge c/w deswecending colon anastomosis  . Pilonidal cyst excision  1980's  . Laparoscopy N/A 12/04/2015    Procedure: LAPAROSCOPY DIAGNOSTIC, LAPAROSCOPIC INTEROLYSIS OF ADHESIONS, HAND- ASSISTED SMALL BOWEL RESECTION, MOBILIZATION OF SPLENIC FLEXURE, LEFT COLECTOMY WITH CREATION OF COLOSTOMY (HARTMAN'S PROCEDURE);  Surgeon: Alphonsa Overall, MD;  Location: [facility];  Service: General;  Laterality: N/A;       History of present illness and  Hospital Course:     Kindly see H&P for history of present illness and  admission details, please review complete Labs, Consult reports and Test reports for all details in brief  HPI  from the history and physical done on the day of admission  HPI: [name] is a 60 y.o. male with history of seizures, Crohn's disease, mild mental cognitive defect was brought to the ER after patient had multiple episodes of nausea vomiting. As per patient patient had a normal bowel movement 24 hours ago following which patient started developing abdominal pain started off in the lower abdomen. Following which patient had multiple episodes of nausea and vomiting. Patient presented later today to the ER. CT abdomen and pelvis shows high-grade small bowel obstruction. On-call surgeon Dr. Johney [name] was consulted and patient had NG tube placed with at least 500 mL of dark fluid being suctioned. In addition patient has not making much urine since morning. Creatinine is elevated. Patient is also in the low blood pressure range with  tachycardia for which patient has been given 3 L of fluid. Patient is being admitted for small bowel obstruction with SIRS. Patient denies any chest pain shortness of breath fever chills. UA is pending.   Hospital Course  60 y.o. male male with history of seizures, Crohn's disease, mild mental cognitive defect was brought to the ER after patient had multiple episodes of nausea & vomiting. CT abd and pelvis show high grade obstruction. NG tube placed in ER. Also noted to have ARF and severe lactic acidosis.  As obstruction did not resolve, he underwent Ex lap with bowel resection and colostomy. He appeared to be recovering well until 2/13 when he pulled out his NG tube and became slightly confused. With fever and hypoxia, treated with IV Zosyn for aspiration pneumonia, currently resolved, continues to improve, diet advanced gradually, advanced to soft diet, patient noticed to have dysphagia.  SIRS - initially due to SBO, which resolved after surgery - Urinalysis with no white blood cells, but significant for nitrite, urine culture with no significant growth - Lactic acid and procalcitonin normal - Patient with an episode on 2/13 overnight. His NG tube, since then having cough, fever, started empirically on IV Zosyn for aspiration pneumonia.  Small bowel obstruction - Small bowel obstruction secondary to small bowel(entero)-left colonic fistula to the mid left colon  - S/p Laparoscopic enterolysis of adhesions , small bowel resection,and left hemicolectomy with mobilization of splenic flexure, and left transverse colon end colostomy with [name], 12/04/15, Dr. Lucia [name] - Initially nothing by mouth hospice hospital course complicated by prolonged postoperative ileus, significantly improved, NG tube discontinued. -Diet was advanced, tolerating dysphagia 2. - Initially on wound VAC, and be discontinued on discharge, continue with wet-to-dry dressing on a daily basis as discussed with  surgery.  Dysphagia - Patient notices with coughing episode during meals, SLP evaluation appreciated, patient is risk for aspiration, discussed options of PEG with [facility], at this point they are more inclined with for conservative approach including dysphagia 2 with thin liquid, if significant aspiration, they will consider PEG tube.  Aspiration pneumonia - Treated with IV Zosyn, significantly improving, finished antibiotics on 2/18  Dehydration - on admission - improved with IV hydration   Hypernatremia - Resolved  Hypokalemia - Repleted   Crohn's disease / small bowel fistula - on sulfasalazine at home, discussed with GI, at this point won't resume her home medication given inflamed bowel part is resected, continue to follow with GI as an outpatient for further recommendation.  Mild hypoglycemia - on admission - resolved   Seizure disorder -  Continue with Keppra   ARF (acute renal failure) ([place]) - likely due to dehydration? -renal function has normalized   Discharge Condition: STABLE   Follow UP  Follow-up Information    Follow up with Jerene Bears, MD On 02/07/2016.   Specialty:  Gastroenterology   Why:  at 8:45 am   Contact information:   [address] [place] [address] [phone]       Follow up with Shann Medal, MD On 12/30/2015.   Specialty:  General Surgery   Why:  appointment time: 915AM   Contact information:   [address] [place] [address] [phone]         Discharge Instructions  and  Discharge Medications     Discharge Instructions    Diet - low sodium heart healthy    Complete by:  As directed      Discharge instructions    Complete by:  As directed   Follow with SNF physician in 3 days. - Agent to be seen by speech language pathology.  Get CBC, CMP,    Activity: As tolerated with Full fall precautions use walker/cane & assistance as needed   Disposition Home **   Diet: Dysphagia 2 with thin  liquid diet , with feeding assistance and aspiration precautions.  For Heart failure patients - Check your Weight same time everyday, if you gain over 2 pounds, or you develop in leg swelling, experience more shortness of breath or chest pain, call your Primary MD immediately. Follow Cardiac Low Salt Diet and 1.5 lit/day fluid restriction.   On your next visit with your primary care physician please Get Medicines reviewed and adjusted.   Please request your Prim.MD to go over all Hospital Tests and Procedure/Radiological results at the follow up, please get all Hospital records sent to your Prim MD by signing hospital release before you go home.   If you experience worsening of your admission symptoms, develop shortness of breath, life threatening emergency, suicidal or homicidal thoughts you must seek medical attention immediately by calling 911 or calling your MD immediately  if symptoms less severe.  You Must read complete instructions/literature along with all the possible adverse reactions/side effects for all the Medicines you take and that have been prescribed to you. Take any new Medicines after you have completely understood and accpet all the possible adverse reactions/side effects.   Do not drive, operating heavy machinery, perform activities at heights, swimming or participation in water activities or provide baby sitting services if your were admitted for syncope or siezures until you have seen by Primary MD or a Neurologist and advised to do so again.  Do not drive when taking Pain medications.    Do not take more than prescribed Pain, Sleep and Anxiety Medications  Special Instructions: If you have smoked or chewed Tobacco  in the last 2 yrs please stop smoking, stop any regular Alcohol  and or any Recreational drug use.  Wear Seat belts while driving.   Please note  You were cared for by a hospitalist during your hospital stay. If you have any questions about your  discharge medications or the care you received while you were in the hospital after you are discharged, you can call the unit and asked to speak with the hospitalist on call if the hospitalist that took care of you is not available. Once you are discharged, your primary care physician will handle any further medical issues. Please note that NO REFILLS  for any discharge medications will be authorized once you are discharged, as it is imperative that you return to your primary care physician (or establish a relationship with a primary care physician if you do not have one) for your aftercare needs so that they can reassess your need for medications and monitor your lab values.     Discharge wound care:    Complete by:  As directed   Change dressing wet-to-dry daily on abdominal wound - Continue with colostomy care     Increase activity slowly    Complete by:  As directed             Medication List    STOP taking these medications        loratadine 10 MG tablet  Commonly known as:  CLARITIN     mercaptopurine 50 MG tablet  Commonly known as:  PURINETHOL     sulfaSALAzine 500 MG tablet  Commonly known as:  AZULFIDINE      TAKE these medications        acetaminophen 325 MG tablet  Commonly known as:  TYLENOL  Take 2 tablets (650 mg total) by mouth every 6 (six) hours as needed for mild pain (or Fever >/= 101).     feeding supplement Liqd  Take 1 Container by mouth 3 (three) times daily with meals.     feeding supplement (ENSURE ENLIVE) Liqd  Take 237 mLs by mouth 3 (three) times daily between meals.     folic acid 1 MG tablet  Commonly known as:  FOLVITE  Take 1 mg by mouth daily.     levETIRAcetam 500 MG tablet  Commonly known as:  KEPPRA  Take 1 tablet (500 mg total) by mouth 2 (two) times daily.     magic mouthwash Soln  Take 15 mLs by mouth 4 (four) times daily as needed for mouth pain (sore throat).     ondansetron 4 MG tablet  Commonly known as:  ZOFRAN  Take 1  tablet (4 mg total) by mouth every 6 (six) hours as needed for nausea.     oxyCODONE 5 MG immediate release tablet  Commonly known as:  Oxy IR/ROXICODONE  Take 1-2 tablets (5-10 mg total) by mouth every 6 (six) hours as needed for severe pain.     primidone 250 MG tablet  Commonly known as:  MYSOLINE  Take 2 tabs twice a day          Diet and Activity recommendation: See Discharge Instructions above   Consults obtained -  Gen. Surgery GI   Major procedures and Radiology Reports - PLEASE review detailed and final reports for all details, in brief -   S/p Laparoscopic enterolysis of adhesions , small bowel resection,and left hemicolectomy with mobilization of splenic flexure, and left transverse colon end colostomy with Hartmann pouch, 12/04/15, Dr. Lucia [name]  Ct Abdomen Pelvis Wo Contrast  12/01/2015  CLINICAL DATA:  Upper abdominal discomfort made worse by eating. EXAM: CT ABDOMEN AND PELVIS WITHOUT CONTRAST TECHNIQUE: Multidetector CT imaging of the abdomen and pelvis was performed following the standard protocol without IV contrast. COMPARISON:  Multiple priors, most recent 01/05/2015. FINDINGS: Lower chest: BILATERAL lower lobe opacities, likely aspiration secondary to nausea and vomiting. Hepatobiliary: No mass visualized on this un-enhanced exam. Gallstones. Pancreas: No mass or inflammatory process identified on this un-enhanced exam. Spleen: Within normal limits in size. Adrenals/Urinary Tract: No evidence of urolithiasis or hydronephrosis. No definite mass visualized on this un-enhanced exam. Stomach/Bowel: Markedly distended  stomach, and small bowel. Partial/RIGHT hemicolectomy. Severe distal small bowel obstruction. Transition zone LEFT mid abdomen, likely secondary to adhesions. Free fluid in the pelvis without signs of pneumatosis or perforation. Vascular/Lymphatic: No pathologically enlarged lymph nodes. No evidence of abdominal aortic aneurysm. Reproductive: No mass or other  significant abnormality. Normal prostate. Musculoskeletal:  No suspicious bone lesions identified. IMPRESSION: High-grade small bowel obstruction, likely due to adhesions. No pneumoperitoneum. Small amount of free fluid pelvis. Previous partial colectomy. BILATERAL lower lobe opacities, likely aspiration for nausea and vomiting. Gallstones. Electronically Signed   By: Staci Righter M.D.   On: 12/01/2015 18:51   Dg Chest 1 View  12/07/2015  CLINICAL DATA:  Seizure activity EXAM: CHEST 1 VIEW COMPARISON:  12/02/2015 FINDINGS: Cardiac shadow is enlarged but stable. A nasogastric catheter is seen in the stomach. Patchy bibasilar changes are again seen and stable. No new focal abnormality is noted. IMPRESSION: Patchy bibasilar changes stable from the prior exam. Electronically Signed   By: Inez Catalina M.D.   On: 12/07/2015 10:13   Dg Abd 1 View  12/07/2015  CLINICAL DATA:  Check nasogastric catheter placement EXAM: ABDOMEN - 1 VIEW COMPARISON:  12/03/2015 FINDINGS: Nasogastric catheter is noted within the distal stomach. Scattered large and small bowel gas is noted. Mild persistent small bowel dilatation is noted. IMPRESSION: Nasogastric catheter within the stomach. Persistent small bowel dilatation is noted. Electronically Signed   By: Inez Catalina M.D.   On: 12/07/2015 10:14   Dg Chest Port 1 View  12/02/2015  CLINICAL DATA:  Followup of small bowel obstruction, sepsis. EXAM: PORTABLE CHEST 1 VIEW COMPARISON:  Chest CT 03/31/2014.  Abdominal CT of 12/01/2015. FINDINGS: nasogastric tube extends beyond the inferior aspect of the film. Midline trachea. Cardiomegaly accentuated by AP portable technique. Probable small left pleural effusion. No pneumothorax. Patchy bibasilar airspace disease. Low lung volumes with resultant pulmonary interstitial prominence. IMPRESSION: Bibasilar airspace disease, suspicious for pneumonia or aspiration. Similar to the prior CT. Cardiomegaly and mildly low lung volumes.  Electronically Signed   By: Abigail Miyamoto M.D.   On: 12/02/2015 10:35   Dg Abd 2 Views  12/03/2015  CLINICAL DATA:  Followup small-bowel obstruction EXAM: ABDOMEN - 2 VIEW COMPARISON:  12/02/2015 FINDINGS: The small bowel dilatation is very similar to the prior study with small bowel loops measuring up to 6 cm. Proximal to mid small bowel is distended with decompressed distal small bowel and decompressed colon. There is air seen in to the distal colon. An NG tube projects over the anticipated position of the proximal duodenum. A tiny volume of contrast is seen within the stomach. The dilated loops show air-fluid levels. There is no evidence of pneumoperitoneum. IMPRESSION: Incomplete but relatively high-grade small-bowel obstruction very similar to prior study. Electronically Signed   By: Skipper Cliche M.D.   On: 12/03/2015 08:37   Dg Abd 2 Views  12/02/2015  CLINICAL DATA:  Small bowel obstruction. EXAM: ABDOMEN - 2 VIEW COMPARISON:  None. FINDINGS: Nasogastric tube is in the stomach which is decompressed. Significant small bowel obstruction remains. The exact site of obstruction is not observed on these radiographs. There is no free air. IMPRESSION: Small bowel obstruction persists. Electronically Signed   By: Staci Righter M.D.   On: 12/02/2015 22:25   Dg Abd Portable 1v-small Bowel Obstruction Protocol-initial, 8 Hr Delay  12/02/2015  CLINICAL DATA:  24 hour small bowel protocol film, patient states abdomen still distended, suction on. EXAM: PORTABLE ABDOMEN - 1 VIEW COMPARISON:  12/01/2015  FINDINGS: Nasogastric tube is in place, tip overlying the level of the distal stomach. There has been interval decompression of the stomach. Small amount of contrast is identified the gastric fundus. There is persistent significant dilatation of numerous small bowel loops. Gas is identified in nondilated large bowel loops. No evidence for free intraperitoneal air on this supine views provided. Bowel sutures are  identified in the right upper quadrant the abdomen. Degenerative changes are seen in the spine and hips. IMPRESSION: 1. Persistent high-grade small bowel obstruction. 2. Interval decompression of the stomach by nasogastric tube. Electronically Signed   By: Nolon Nations M.D.   On: 12/02/2015 09:49   Dg Abd Portable 1v-small Bowel Protocol-position Verification  12/01/2015  CLINICAL DATA:  Small bowel protocol, evaluate NGT. EXAM: PORTABLE ABDOMEN - 1 VIEW COMPARISON:  12/01/2015. FINDINGS: A gastric tube has been inserted. The last side hole is near the gastroesophageal junction. The tip of the tube lies within the stomach. The tube could be advanced several cm. There is marked gaseous distention of the stomach and small bowel. IMPRESSION: Nasogastric tube tip lies within the stomach but could be advanced several cm. Electronically Signed   By: Staci Righter M.D.   On: 12/01/2015 21:52    Micro Results    Recent Results (from the past 240 hour(s))  Culture, blood (Routine X 2) w Reflex to ID Panel     Status: None   Collection Time: 12/07/15 10:43 AM  Result Value Ref Range Status   Specimen Description BLOOD RIGHT HAND  Final   Special Requests BOTTLES DRAWN AEROBIC AND ANAEROBIC 8CC  Final   Culture   Final    NO GROWTH 5 DAYS Performed at [facility]    Report Status 12/12/2015 FINAL  Final  Culture, blood (Routine X 2) w Reflex to ID Panel     Status: None   Collection Time: 12/07/15 10:45 AM  Result Value Ref Range Status   Specimen Description BLOOD LEFT HAND  Final   Special Requests BOTTLES DRAWN AEROBIC AND ANAEROBIC 10CC  Final   Culture   Final    NO GROWTH 5 DAYS Performed at [facility]    Report Status 12/12/2015 FINAL  Final  Urine culture     Status: None   Collection Time: 12/07/15 12:25 PM  Result Value Ref Range Status   Specimen Description URINE, RANDOM  Final   Special Requests NONE  Final   Culture   Final    NO GROWTH 2 DAYS Performed at  [facility]    Report Status 12/09/2015 FINAL  Final  MRSA PCR Screening     Status: None   Collection Time: 12/07/15  6:16 PM  Result Value Ref Range Status   MRSA by PCR NEGATIVE NEGATIVE Final    Comment:        The GeneXpert MRSA Assay (FDA approved for NASAL specimens only), is one component of a comprehensive MRSA colonization surveillance program. It is not intended to diagnose MRSA infection nor to guide or monitor treatment for MRSA infections.        Today   Subjective:   [name] today has no headache,no chest or abdominal pain, tolerating diet  Objective:   Blood pressure 132/65, pulse 94, temperature 98 F (36.7 C), temperature source Oral, resp. rate 20, height 5' 10"  (1.778 m), weight 95.7 kg (210 lb 15.7 oz), SpO2 97 %.   Intake/Output Summary (Last 24 hours) at 12/14/15 1212 Last data filed  at 12/14/15 0645  Gross per 24 hour  Intake      0 ml  Output   1750 ml  Net  -1750 ml    Exam  General: Pt is alert, not in acute distress  HEENT: No icterus, No thrush, oral mucosa moist  Cardiovascular: regular rate and rhythm, S1/S2 No murmur  Respiratory: clear to auscultation bilaterally , no wheezing  Abdomen: Soft, + Bowel sounds, non tender, NG tube draining brown fluid, colostomy +, midline small surgical wound with wound VAC, non distended, no guarding  MSK: No cyanosis or clubbing- no pedal edema  Data Review   CBC w Diff: Lab Results  Component Value Date   WBC 11.6* 12/11/2015   HGB 12.2* 12/11/2015   HCT 36.8* 12/11/2015   PLT 431* 12/11/2015   LYMPHOPCT 6 12/05/2015   MONOPCT 22 12/05/2015   EOSPCT 0 12/05/2015   BASOPCT 1 12/05/2015    CMP: Lab Results  Component Value Date   NA 140 12/11/2015   K 3.6 12/11/2015   CL 106 12/11/2015   CO2 25 12/11/2015   BUN 8 12/11/2015   CREATININE 0.99 12/11/2015   CREATININE 0.87 10/28/2014   PROT 5.2* 12/08/2015   ALBUMIN 1.9* 12/08/2015   BILITOT 2.2* 12/08/2015    ALKPHOS 57 12/08/2015   AST 15 12/08/2015   ALT 11* 12/08/2015  .   Total Time in preparing paper work, data evaluation and todays exam - 35 minutes  Liane Tribbey M.D on 12/14/2015 at 12:12 PM  [facility]   Office  [phone]

## 2015-12-14 NOTE — Clinical Social Work Placement (Signed)
   CLINICAL SOCIAL WORK PLACEMENT  NOTE  Date:  12/14/2015  Patient Details  Name: ERVEY FALLIN MRN: [id number] Date of Birth: 1956/05/26  Clinical Social Work is seeking post-discharge placement for this patient at the   level of care (*CSW will initial, date and re-position this form in  chart as items are completed):  Yes   Patient/family provided with [place] Work Department's list of facilities offering this level of care within the geographic area requested by the patient (or if unable, by the patient's family).  Yes   Patient/family informed of their freedom to choose among providers that offer the needed level of care, that participate in Medicare, Medicaid or managed care program needed by the patient, have an available bed and are willing to accept the patient.  Yes   Patient/family informed of [facility] ownership interest in [facility] and [facility], as well as of the fact that they are under no obligation to receive care at these facilities.  PASRR submitted to EDS on 12/10/15     PASRR number received on 12/10/15     Existing PASRR number confirmed on       FL2 transmitted to all facilities in geographic area requested by pt/family on 12/10/15     FL2 transmitted to all facilities within larger geographic area on       Patient informed that his/her managed care company has contracts with or will negotiate with certain facilities, including the following:        Yes   Patient/family informed of bed offers received.  Patient chooses bed at [place], [place]     Physician recommends and patient chooses bed at      Patient to be transferred to [facility] on 12/14/15.  Patient to be transferred to facility by ambulance [facility])     Patient family notified on 12/14/15 of transfer.  Name of family member notified:  pt notified at bedside and pt sister, [name] notified via telephone     PHYSICIAN Please sign FL2      Additional Comment:    _______________________________________________ Ladell Pier, LCSW 12/14/2015, 3:00 PM

## 2015-12-14 NOTE — Progress Notes (Signed)
CSW continuing to follow.   CSW followed up with pt sister, [name] via telephone re: additional SNF bed offers and decision for SNF.   Pt chooses bed at [facility].   Per MD, pt wound vac can be discontinued and pt can discharge with plan for wet to dry dressing changes.   CSW contacted [facility] to notify of acceptance of bed offer and confirmed facility could accept pt today.   CSW updated pt FL2 and sent [facility] updated FL2 and discharge information via epic hub.   CSW facilitated pt discharge needs including contacting facility, discussing with pt at bedside and pt sister, [name] via telephone, providing RN phone number to call report, and arranging ambulance transport for pt to [facility].   No further social work needs identified at this time.  CSW signing off.   [name], MSW, [place] [facility] [phone]

## 2016-01-07 ENCOUNTER — Other Ambulatory Visit: Payer: Self-pay | Admitting: *Deleted

## 2016-01-07 MED ORDER — SULFASALAZINE 500 MG PO TABS: 500.0000 mg | ORAL_TABLET | Freq: Every day | ORAL | Status: DC

## 2016-01-15 ENCOUNTER — Emergency Department (HOSPITAL_COMMUNITY): Payer: Medicare Other

## 2016-01-15 ENCOUNTER — Encounter (HOSPITAL_COMMUNITY): Payer: Self-pay | Admitting: Emergency Medicine

## 2016-01-15 ENCOUNTER — Observation Stay (HOSPITAL_COMMUNITY)
Admission: EM | Admit: 2016-01-15 | Discharge: 2016-01-18 | Disposition: A | Discharge status: Skilled Nursing Facility | Payer: Medicare Other | Attending: Internal Medicine | Admitting: Internal Medicine

## 2016-01-15 DIAGNOSIS — G40909 Epilepsy, unspecified, not intractable, without status epilepticus: Secondary | ICD-10-CM

## 2016-01-15 DIAGNOSIS — S01112A Laceration without foreign body of left eyelid and periocular area, initial encounter: Secondary | ICD-10-CM | POA: Diagnosis not present

## 2016-01-15 DIAGNOSIS — W19XXXA Unspecified fall, initial encounter: Secondary | ICD-10-CM | POA: Diagnosis not present

## 2016-01-15 DIAGNOSIS — R262 Difficulty in walking, not elsewhere classified: Secondary | ICD-10-CM | POA: Diagnosis not present

## 2016-01-15 DIAGNOSIS — E860 Dehydration: Secondary | ICD-10-CM | POA: Diagnosis present

## 2016-01-15 DIAGNOSIS — R55 Syncope and collapse: Secondary | ICD-10-CM | POA: Diagnosis not present

## 2016-01-15 DIAGNOSIS — R631 Polydipsia: Secondary | ICD-10-CM | POA: Insufficient documentation

## 2016-01-15 DIAGNOSIS — A419 Sepsis, unspecified organism: Secondary | ICD-10-CM | POA: Diagnosis present

## 2016-01-15 DIAGNOSIS — R197 Diarrhea, unspecified: Secondary | ICD-10-CM

## 2016-01-15 DIAGNOSIS — I959 Hypotension, unspecified: Secondary | ICD-10-CM | POA: Insufficient documentation

## 2016-01-15 DIAGNOSIS — G40009 Localization-related (focal) (partial) idiopathic epilepsy and epileptic syndromes with seizures of localized onset, not intractable, without status epilepticus: Secondary | ICD-10-CM | POA: Diagnosis not present

## 2016-01-15 DIAGNOSIS — R05 Cough: Secondary | ICD-10-CM

## 2016-01-15 DIAGNOSIS — K5090 Crohn's disease, unspecified, without complications: Secondary | ICD-10-CM | POA: Diagnosis not present

## 2016-01-15 DIAGNOSIS — R112 Nausea with vomiting, unspecified: Secondary | ICD-10-CM

## 2016-01-15 DIAGNOSIS — K529 Noninfective gastroenteritis and colitis, unspecified: Secondary | ICD-10-CM | POA: Diagnosis present

## 2016-01-15 DIAGNOSIS — K50813 Crohn's disease of both small and large intestine with fistula: Secondary | ICD-10-CM | POA: Diagnosis present

## 2016-01-15 DIAGNOSIS — N179 Acute kidney failure, unspecified: Secondary | ICD-10-CM | POA: Diagnosis not present

## 2016-01-15 DIAGNOSIS — E875 Hyperkalemia: Secondary | ICD-10-CM

## 2016-01-15 DIAGNOSIS — R059 Cough, unspecified: Secondary | ICD-10-CM

## 2016-01-15 LAB — COMPREHENSIVE METABOLIC PANEL
ALBUMIN: 2.8 g/dL — AB (ref 3.5–5.0)
ALK PHOS: 93 U/L (ref 38–126)
ALT: 16 U/L — ABNORMAL LOW (ref 17–63)
AST: 46 U/L — AB (ref 15–41)
Anion gap: 10 (ref 5–15)
BILIRUBIN TOTAL: 1.3 mg/dL — AB (ref 0.3–1.2)
BUN: 16 mg/dL (ref 6–20)
CALCIUM: 8.5 mg/dL — AB (ref 8.9–10.3)
CO2: 20 mmol/L — ABNORMAL LOW (ref 22–32)
CREATININE: 2.14 mg/dL — AB (ref 0.61–1.24)
Chloride: 106 mmol/L (ref 101–111)
GFR calc Af Amer: 37 mL/min — ABNORMAL LOW (ref >60)
GFR, EST NON AFRICAN AMERICAN: 32 mL/min — AB (ref >60)
GLUCOSE: 119 mg/dL — AB (ref 65–99)
POTASSIUM: 6.8 mmol/L — AB (ref 3.5–5.1)
Sodium: 136 mmol/L (ref 135–145)
TOTAL PROTEIN: 7 g/dL (ref 6.5–8.1)

## 2016-01-15 LAB — CBC WITH DIFFERENTIAL/PLATELET
BASOS ABS: 0 10*3/uL (ref 0.0–0.1)
Basophils Relative: 0 %
Eosinophils Absolute: 0 10*3/uL (ref 0.0–0.7)
Eosinophils Relative: 0 %
HEMATOCRIT: 40.4 % (ref 39.0–52.0)
HEMOGLOBIN: 13.5 g/dL (ref 13.0–17.0)
LYMPHS PCT: 2 %
Lymphs Abs: 0.3 10*3/uL — ABNORMAL LOW (ref 0.7–4.0)
MCH: 34 pg (ref 26.0–34.0)
MCHC: 33.4 g/dL (ref 30.0–36.0)
MCV: 101.8 fL — AB (ref 78.0–100.0)
MONO ABS: 0.9 10*3/uL (ref 0.1–1.0)
MONOS PCT: 8 %
NEUTROS ABS: 10.6 10*3/uL — AB (ref 1.7–7.7)
NEUTROS PCT: 90 %
Platelets: 245 10*3/uL (ref 150–400)
RBC: 3.97 MIL/uL — ABNORMAL LOW (ref 4.22–5.81)
RDW: 13.3 % (ref 11.5–15.5)
WBC: 11.7 10*3/uL — ABNORMAL HIGH (ref 4.0–10.5)

## 2016-01-15 LAB — I-STAT CHEM 8, ED
BUN: 18 mg/dL (ref 6–20)
CHLORIDE: 113 mmol/L — AB (ref 101–111)
CREATININE: 1.6 mg/dL — AB (ref 0.61–1.24)
Calcium, Ion: 0.94 mmol/L — ABNORMAL LOW (ref 1.13–1.30)
GLUCOSE: 83 mg/dL (ref 65–99)
HEMATOCRIT: 40 % (ref 39.0–52.0)
Hemoglobin: 13.6 g/dL (ref 13.0–17.0)
POTASSIUM: 5.2 mmol/L — AB (ref 3.5–5.1)
Sodium: 139 mmol/L (ref 135–145)
TCO2: 15 mmol/L (ref 0–100)

## 2016-01-15 LAB — LIPASE, BLOOD: Lipase: 49 U/L (ref 11–51)

## 2016-01-15 LAB — I-STAT TROPONIN, ED: TROPONIN I, POC: 0.02 ng/mL (ref 0.00–0.08)

## 2016-01-15 MED ORDER — SODIUM CHLORIDE 0.9 % IV BOLUS (SEPSIS)
500.0000 mL | Freq: Once | INTRAVENOUS | Status: AC
  Administered 2016-01-15: 500 mL via INTRAVENOUS

## 2016-01-15 MED ORDER — SODIUM CHLORIDE 0.9 % IV BOLUS (SEPSIS)
1000.0000 mL | Freq: Once | INTRAVENOUS | Status: AC
  Administered 2016-01-15: 1000 mL via INTRAVENOUS

## 2016-01-15 MED ORDER — ONDANSETRON HCL 4 MG/2ML IJ SOLN
4.0000 mg | Freq: Once | INTRAMUSCULAR | Status: AC
  Administered 2016-01-15: 4 mg via INTRAVENOUS
  Filled 2016-01-15: qty 2

## 2016-01-15 MED ORDER — LIDOCAINE-EPINEPHRINE (PF) 2 %-1:200000 IJ SOLN
10.0000 mL | Freq: Once | INTRAMUSCULAR | Status: AC
  Administered 2016-01-15: 10 mL
  Filled 2016-01-15: qty 20

## 2016-01-15 NOTE — ED Notes (Signed)
Pt brought to ED by [facility] from [facility] after having a syncope episode, pt was getting up with nursing staff when he pass out through his left side, pt having small laceration on his left eye and tender to lower back, numbness to his lower extremity.  Pt states he is been having n/v/d for the past 3 days. VS BP 92/56, HR 110, R 23, 97 % RA, GBG 174.

## 2016-01-15 NOTE — ED Notes (Signed)
Phone call received from a family member that states she is PA of the pt asking for information over the phone, this RN told family member that pt is stable on the ED but we can release any information over the phone, family requesting to talk to the pt states that the pt is mentally impair and he can tell me to give information to her over the phone, family oriented that we can't release any information over the phone and if the pt is mentally unpair he can't give [place] permission to give information over the phone, that she will need to come to the hospital to get information or talk directly to him and get information from him. Raquel Sarna, CN notified.

## 2016-01-15 NOTE — ED Provider Notes (Signed)
CSN: [id number]     Arrival date & time 01/15/16  1940 History   First MD Initiated Contact with Patient 01/15/16 1944     Chief Complaint  Patient presents with  . Loss of Consciousness  . Nausea  . Emesis     (Consider location/radiation/quality/duration/timing/severity/associated sxs/prior Treatment) The history is provided by the patient and medical records. No language interpreter was used.     [name] is a 60 y.o. male  with a hx of anemia, crohn's disease (colostomy), epilep presents to the Emergency Department complaining of acute, witnessed syncope. Patient stood from a sitting position to change his pants when he came lightheaded and passed out.  This was witnessed. Patient hit the left side of his head on the ground causing a laceration.  Per patient, he has had multiple episodes of nausea vomiting and diarrhea over the last 3 days.  Patient arrives via EMS fully immobilized as he complained of midline low back pain after the fall and numbness in his bilateral lower extremities. Patient reports the sensory deficit is baseline for him.  He denies fevers, chills, chest pain, shortest of breath, abdominal pain, dysuria, hematuria.   Past Medical History  Diagnosis Date  . Other B-complex deficiencies   . Iron deficiency anemia, unspecified   . Abscess of anal and rectal regions   . Crohn's disease ([place])   . Mental disability   . Heart murmur   . History of blood transfusion ~ 1992    "related to my Crohn's"  . History of stomach ulcers   . Epilepsy ([place])     "petit mal; only at home" (6/9//2015)  . Syncope and collapse 03/31/2014    "while driving"  . Hyperplastic colon polyp   . REGIONAL ENTERITIS, LARGE INTESTINE 10/05/2000    Qualifier: Diagnosis of  By: Jerral Ralph    . Syncope 03/31/2014  . FISTULA, INTESTINE 01/29/2008    Qualifier: Diagnosis of  By: Jerral Ralph     Past Surgical History  Procedure Laterality Date  . Hemicolectomy  1985     ileocectomy   . Colonoscopy  2013, 2016  . Umbilical hernia repair  ~ 1983  . Tonsillectomy  ~ 1967  . Hernia repair    . Colon surgery      Anastomosis 45cm from anal verge c/w deswecending colon anastomosis  . Pilonidal cyst excision  1980's  . Laparoscopy N/A 12/04/2015    Procedure: LAPAROSCOPY DIAGNOSTIC, LAPAROSCOPIC INTEROLYSIS OF ADHESIONS, HAND- ASSISTED SMALL BOWEL RESECTION, MOBILIZATION OF SPLENIC FLEXURE, LEFT COLECTOMY WITH CREATION OF COLOSTOMY (HARTMAN'S PROCEDURE);  Surgeon: Alphonsa Overall, MD;  Location: [facility] ORS;  Service: General;  Laterality: N/A;   Family History  Problem Relation Age of Onset  . Skin cancer Father   . Seizures Neg Hx   . Crohn's disease Neg Hx    Social History  Substance Use Topics  . Smoking status: Never Smoker   . Smokeless tobacco: Never Used  . Alcohol Use: No    Review of Systems  Constitutional: Negative for fever, diaphoresis, appetite change, fatigue and unexpected weight change.  HENT: Negative for mouth sores.   Eyes: Negative for visual disturbance.  Respiratory: Negative for cough, chest tightness, shortness of breath and wheezing.   Cardiovascular: Negative for chest pain.  Gastrointestinal: Positive for nausea, vomiting and diarrhea. Negative for abdominal pain and constipation.  Endocrine: Negative for polydipsia, polyphagia and polyuria.  Genitourinary: Negative for dysuria, urgency, frequency and hematuria.  Musculoskeletal: Negative for back  pain and neck stiffness.  Skin: Positive for wound. Negative for rash.  Allergic/Immunologic: Negative for immunocompromised state.  Neurological: Positive for syncope. Negative for light-headedness and headaches.  Hematological: Does not bruise/bleed easily.  Psychiatric/Behavioral: Negative for sleep disturbance. The patient is not nervous/anxious.       Allergies  Review of patient's allergies indicates no known allergies.  Home Medications   Prior to Admission  medications   Medication Sig Start Date End Date Taking? Authorizing Provider  acetaminophen (TYLENOL) 325 MG tablet Take 2 tablets (650 mg total) by mouth every 6 (six) hours as needed for mild pain (or Fever >/= 101). 12/14/15  Yes Albertine Patricia, MD  folic acid (FOLVITE) 1 MG tablet Take 1 mg by mouth daily.   Yes Historical Provider, MD  levETIRAcetam (KEPPRA) 500 MG tablet Take 1 tablet (500 mg total) by mouth 2 (two) times daily. 09/29/15  Yes Cameron Sprang, MD  magic mouthwash SOLN Take 15 mLs by mouth 4 (four) times daily as needed for mouth pain (sore throat). 12/14/15  Yes Albertine Patricia, MD  Nutritional Supplements (NUTRA SHAKE PO) Take 1 Container by mouth 2 (two) times daily.   Yes Historical Provider, MD  ondansetron (ZOFRAN) 4 MG tablet Take 1 tablet (4 mg total) by mouth every 6 (six) hours as needed for nausea. 12/14/15  Yes Albertine Patricia, MD  primidone (MYSOLINE) 250 MG tablet Take 2 tabs twice a day Patient taking differently: Take 500 mg by mouth 2 (two) times daily. Take 2 tabs twice a day 09/29/15  Yes Cameron Sprang, MD  feeding supplement (BOOST / RESOURCE BREEZE) LIQD Take 1 Container by mouth 3 (three) times daily with meals. 12/14/15   Silver Huguenin Elgergawy, MD  feeding supplement, ENSURE ENLIVE, (ENSURE ENLIVE) LIQD Take 237 mLs by mouth 3 (three) times daily between meals. 12/14/15   Silver Huguenin Elgergawy, MD  oxyCODONE (OXY IR/ROXICODONE) 5 MG immediate release tablet Take 1-2 tablets (5-10 mg total) by mouth every 6 (six) hours as needed for severe pain. 12/14/15   Silver Huguenin Elgergawy, MD  sulfaSALAzine (AZULFIDINE) 500 MG tablet Take 1 tablet (500 mg total) by mouth daily. 01/07/16   Jerene Bears, MD   BP 110/59 mmHg  Pulse 103  Temp(Src) 98.3 F (36.8 C) (Oral)  Resp 23  Ht 5' 10"  (1.778 m)  Wt 95.255 kg  BMI 30.13 kg/m2  SpO2 99% Physical Exam  Constitutional: He is oriented to person, place, and time. He appears well-developed and well-nourished. No distress.   HENT:  Head: Normocephalic.  Right Ear: External ear normal.  Left Ear: External ear normal.  Nose: Nose normal.  Mouth/Throat: Uvula is midline and oropharynx is clear and moist. Mucous membranes are dry.  4cm laceration over the left eyebrow  Eyes: Conjunctivae and EOM are normal. Pupils are equal, round, and reactive to light.  Neck: No spinous process tenderness and no muscular tenderness present. No rigidity. Normal range of motion present.  C-collar in place No midline cervical tenderness No crepitus, deformity or step-offs No paraspinal tenderness  Cardiovascular: Regular rhythm, normal heart sounds and intact distal pulses.  Tachycardia present.   Pulses:      Radial pulses are 2+ on the right side, and 2+ on the left side.       Dorsalis pedis pulses are 2+ on the right side, and 2+ on the left side.       Posterior tibial pulses are 2+ on the right side,  and 2+ on the left side.  Pulmonary/Chest: Effort normal and breath sounds normal. No accessory muscle usage. No respiratory distress. He has no decreased breath sounds. He has no wheezes. He has no rhonchi. He has no rales. He exhibits no tenderness and no bony tenderness.  No contusion or ecchymosis  No flail segment, crepitus or deformity Equal chest expansion  Abdominal: Soft. Normal appearance and bowel sounds are normal. There is no tenderness. There is no rigidity, no guarding and no CVA tenderness.  No contusion or ecchymosis Abd soft and nontender Colostomy in place and leaking green stool  Musculoskeletal: Normal range of motion.       Thoracic back: He exhibits normal range of motion.       Lumbar back: He exhibits normal range of motion.  Patient fully immobilized on long spine board No tenderness to palpation of the spinous processes of the T-spine or L-spine No crepitus, deformity or step-offs No tenderness to palpation of the paraspinous muscles of the L-spine Full range of motion of the bilateral hips  knees and ankles without pain  Lymphadenopathy:    He has no cervical adenopathy.  Neurological: He is alert and oriented to person, place, and time. He has normal reflexes. No cranial nerve deficit. GCS eye subscore is 4. GCS verbal subscore is 5. GCS motor subscore is 6.  Reflex Scores:      Bicep reflexes are 2+ on the right side and 2+ on the left side.      Brachioradialis reflexes are 2+ on the right side and 2+ on the left side.      Patellar reflexes are 2+ on the right side and 2+ on the left side.      Achilles reflexes are 2+ on the right side and 2+ on the left side. Speech is clear and goal oriented, follows commands Normal 5/5 strength in upper and lower extremities bilaterally including dorsiflexion and plantar flexion, strong and equal grip strength Sensation normal to light and sharp touch Moves extremities without ataxia, coordination intact Gait testing deferred No Clonus  Skin: Skin is warm and dry. No rash noted. He is not diaphoretic. No erythema.  No rash Abrasion to the left knee  Psychiatric: He has a normal mood and affect.  Nursing note and vitals reviewed.   ED Course  .Marland KitchenLaceration Repair Date/Time: 01/15/2016 11:49 PM Performed by: Abigail Butts Authorized by: Abigail Butts Consent: Verbal consent obtained. Risks and benefits: risks, benefits and alternatives were discussed Consent given by: patient Patient understanding: patient states understanding of the procedure being performed Patient consent: the patient's understanding of the procedure matches consent given Procedure consent: procedure consent matches procedure scheduled Relevant documents: relevant documents present and verified Site marked: the operative site was marked Required items: required blood products, implants, devices, and special equipment available Patient identity confirmed: verbally with patient and arm band Time out: Immediately prior to procedure a "time out" was  called to verify the correct patient, procedure, equipment, support staff and site/side marked as required. Body area: head/neck Location details: scalp Laceration length: 4 (y shaped) cm Foreign bodies: no foreign bodies Tendon involvement: none Nerve involvement: none Vascular damage: no Anesthesia: local infiltration Local anesthetic: lidocaine 2% with epinephrine Anesthetic total: 4 ml Patient sedated: no Preparation: Patient was prepped and draped in the usual sterile fashion. Irrigation solution: saline Irrigation method: syringe Amount of cleaning: extensive Wound skin closure material used: 5-0 chromic gut. Number of sutures: 16 Technique: running Approximation: close Approximation difficulty:  complex Dressing: 4x4 sterile gauze Patient tolerance: Patient tolerated the procedure well with no immediate complications   (including critical care time) Labs Review Labs Reviewed  CBC WITH DIFFERENTIAL/PLATELET - Abnormal; Notable for the following:    WBC 11.7 (*)    RBC 3.97 (*)    MCV 101.8 (*)    Neutro Abs 10.6 (*)    Lymphs Abs 0.3 (*)    All other components within normal limits  COMPREHENSIVE METABOLIC PANEL - Abnormal; Notable for the following:    Potassium 6.8 (*)    CO2 20 (*)    Glucose, Bld 119 (*)    Creatinine, Ser 2.14 (*)    Calcium 8.5 (*)    Albumin 2.8 (*)    AST 46 (*)    ALT 16 (*)    Total Bilirubin 1.3 (*)    GFR calc non Af Amer 32 (*)    GFR calc Af Amer 37 (*)    All other components within normal limits  I-STAT CHEM 8, ED - Abnormal; Notable for the following:    Potassium 5.2 (*)    Chloride 113 (*)    Creatinine, Ser 1.60 (*)    Calcium, Ion 0.94 (*)    All other components within normal limits  LIPASE, BLOOD  I-STAT TROPOININ, ED    Imaging Review Ct Head Wo Contrast  01/15/2016  CLINICAL DATA:  Syncope, fall, small laceration over left eye EXAM: CT HEAD WITHOUT CONTRAST CT CERVICAL SPINE WITHOUT CONTRAST TECHNIQUE:  Multidetector CT imaging of the head and cervical spine was performed following the standard protocol without intravenous contrast. Multiplanar CT image reconstructions of the cervical spine were also generated. COMPARISON:  CT head/ cervical spine dated 04/10/2014 FINDINGS: CT HEAD FINDINGS No evidence of parenchymal hemorrhage or extra-axial fluid collection. No mass lesion, mass effect, or midline shift. No CT evidence of acute infarction. Mild subcortical white matter and periventricular small vessel ischemic changes. Mild cortical atrophy.  No ventriculomegaly. The visualized paranasal sinuses are essentially clear. Minimal partial opacification the left mastoid air cells (series 4/image 9). Mild soft tissue swelling/hematoma overlying the left frontal bone (series 3/image 20). No evidence of calvarial fracture. CT CERVICAL SPINE FINDINGS Normal cervical lordosis. No evidence of fracture or dislocation. Vertebra body heights are maintained. Dens appears intact. No prevertebral soft tissue swelling. Moderate degenerative changes with stable bulky anterior osteophytosis. Visualized thyroid is notable for stable multinodular goiter, including a dominant 1.8 cm right thyroid nodule (series 4/image 92), unchanged and previously biopsied. Visualized lung apices are clear. IMPRESSION: Mild soft tissue swelling/hematoma overlying the left frontal bone. No evidence of calvarial fracture. No evidence of acute intracranial abnormality. Mild atrophy with small vessel ischemic changes. No evidence of traumatic injury to the cervical spine. Moderate degenerative changes with bulky anterior osteophytosis. Electronically Signed   By: Julian Hy M.D.   On: 01/15/2016 21:50   Ct Cervical Spine Wo Contrast  01/15/2016  CLINICAL DATA:  Syncope, fall, small laceration over left eye EXAM: CT HEAD WITHOUT CONTRAST CT CERVICAL SPINE WITHOUT CONTRAST TECHNIQUE: Multidetector CT imaging of the head and cervical spine was  performed following the standard protocol without intravenous contrast. Multiplanar CT image reconstructions of the cervical spine were also generated. COMPARISON:  CT head/ cervical spine dated 04/10/2014 FINDINGS: CT HEAD FINDINGS No evidence of parenchymal hemorrhage or extra-axial fluid collection. No mass lesion, mass effect, or midline shift. No CT evidence of acute infarction. Mild subcortical white matter and periventricular small vessel ischemic changes.  Mild cortical atrophy.  No ventriculomegaly. The visualized paranasal sinuses are essentially clear. Minimal partial opacification the left mastoid air cells (series 4/image 9). Mild soft tissue swelling/hematoma overlying the left frontal bone (series 3/image 20). No evidence of calvarial fracture. CT CERVICAL SPINE FINDINGS Normal cervical lordosis. No evidence of fracture or dislocation. Vertebra body heights are maintained. Dens appears intact. No prevertebral soft tissue swelling. Moderate degenerative changes with stable bulky anterior osteophytosis. Visualized thyroid is notable for stable multinodular goiter, including a dominant 1.8 cm right thyroid nodule (series 4/image 92), unchanged and previously biopsied. Visualized lung apices are clear. IMPRESSION: Mild soft tissue swelling/hematoma overlying the left frontal bone. No evidence of calvarial fracture. No evidence of acute intracranial abnormality. Mild atrophy with small vessel ischemic changes. No evidence of traumatic injury to the cervical spine. Moderate degenerative changes with bulky anterior osteophytosis. Electronically Signed   By: Julian Hy M.D.   On: 01/15/2016 21:50   I have personally reviewed and evaluated these images and lab results as part of my medical decision-making.   EKG Interpretation   Date/Time:  Saturday January 15 2016 20:00:06 EDT Ventricular Rate:  103 PR Interval:  244 QRS Duration: 83 QT Interval:  244 QTC Calculation: 319 R Axis:   66 Text  Interpretation:  Sinus tachycardia Prolonged PR interval Right atrial  enlargement Confirmed by Alvino Chapel  MD, Ovid Curd [phone]) on 01/15/2016  9:43:27 PM      MDM   Final diagnoses:  Nausea vomiting and diarrhea  Dehydration  Hyperkalemia  AKI (acute kidney injury) ([place])    [name] presents with syncope, likely orthostatic. Patient appears dehydrated with dry mucous membranes, tachycardia.  Labs with leukocytosis of 11.7. Elevated serum creatinine at 2.14 without history of CK D. Previous creatinine 0.9. Initial potassium 6.8 however hemolyzed. Patient 1 L fluid with no orthostatic blood pressures but increase in heart rate.  Orthostatic VS for the past 24 hrs:  BP- Lying Pulse- Lying BP- Sitting Pulse- Sitting BP- Standing at 0 minutes Pulse- Standing at 0 minutes  01/15/16 2240 96/48 mmHg 98 95/54 mmHg 105 105/57 mmHg 114    11:50 PM Repeat potassium on chem 8 shows potassium of 5.2.  Wound repaired without consultation. Due to patient's dehydration and new AKI will admit.   The patient was discussed with and seen by Dr. Alvino Chapel who agrees with the treatment plan.   12:17 AM Discussed with Dr. Roel Cluck who will admit.    Jarrett Soho Clarissa Laird, PA-C 01/16/16 0017  Davonna Belling, MD 01/16/16 1500

## 2016-01-16 ENCOUNTER — Encounter (HOSPITAL_COMMUNITY): Payer: Self-pay | Admitting: Internal Medicine

## 2016-01-16 ENCOUNTER — Encounter (HOSPITAL_COMMUNITY): Payer: Medicare Other

## 2016-01-16 DIAGNOSIS — K50019 Crohn's disease of small intestine with unspecified complications: Secondary | ICD-10-CM | POA: Diagnosis not present

## 2016-01-16 DIAGNOSIS — E860 Dehydration: Secondary | ICD-10-CM | POA: Diagnosis not present

## 2016-01-16 DIAGNOSIS — R55 Syncope and collapse: Secondary | ICD-10-CM | POA: Diagnosis present

## 2016-01-16 DIAGNOSIS — K529 Noninfective gastroenteritis and colitis, unspecified: Secondary | ICD-10-CM | POA: Diagnosis not present

## 2016-01-16 DIAGNOSIS — N179 Acute kidney failure, unspecified: Secondary | ICD-10-CM | POA: Diagnosis not present

## 2016-01-16 DIAGNOSIS — R6510 Systemic inflammatory response syndrome (SIRS) of non-infectious origin without acute organ dysfunction: Secondary | ICD-10-CM

## 2016-01-16 DIAGNOSIS — G40009 Localization-related (focal) (partial) idiopathic epilepsy and epileptic syndromes with seizures of localized onset, not intractable, without status epilepticus: Secondary | ICD-10-CM

## 2016-01-16 LAB — PHOSPHORUS: Phosphorus: 2.9 mg/dL (ref 2.5–4.6)

## 2016-01-16 LAB — GASTROINTESTINAL PANEL BY PCR, STOOL (REPLACES STOOL CULTURE)
ADENOVIRUS F40/41: NOT DETECTED
Astrovirus: NOT DETECTED
CRYPTOSPORIDIUM: NOT DETECTED
CYCLOSPORA CAYETANENSIS: NOT DETECTED
Campylobacter species: NOT DETECTED
E. COLI O157: NOT DETECTED
Entamoeba histolytica: NOT DETECTED
Enteroaggregative E coli (EAEC): NOT DETECTED
Enteropathogenic E coli (EPEC): NOT DETECTED
Enterotoxigenic E coli (ETEC): NOT DETECTED
Giardia lamblia: NOT DETECTED
Norovirus GI/GII: DETECTED — AB
Plesimonas shigelloides: NOT DETECTED
ROTAVIRUS A: NOT DETECTED
SAPOVIRUS (I, II, IV, AND V): NOT DETECTED
SHIGA LIKE TOXIN PRODUCING E COLI (STEC): NOT DETECTED
Salmonella species: NOT DETECTED
Shigella/Enteroinvasive E coli (EIEC): NOT DETECTED
VIBRIO SPECIES: NOT DETECTED
Vibrio cholerae: NOT DETECTED
YERSINIA ENTEROCOLITICA: NOT DETECTED

## 2016-01-16 LAB — COMPREHENSIVE METABOLIC PANEL
ALBUMIN: 2.5 g/dL — AB (ref 3.5–5.0)
ALT: 16 U/L — ABNORMAL LOW (ref 17–63)
AST: 33 U/L (ref 15–41)
Alkaline Phosphatase: 85 U/L (ref 38–126)
Anion gap: 8 (ref 5–15)
BUN: 15 mg/dL (ref 6–20)
CHLORIDE: 112 mmol/L — AB (ref 101–111)
CO2: 21 mmol/L — ABNORMAL LOW (ref 22–32)
Calcium: 8.1 mg/dL — ABNORMAL LOW (ref 8.9–10.3)
Creatinine, Ser: 1.6 mg/dL — ABNORMAL HIGH (ref 0.61–1.24)
GFR calc Af Amer: 52 mL/min — ABNORMAL LOW (ref >60)
GFR, EST NON AFRICAN AMERICAN: 45 mL/min — AB (ref >60)
Glucose, Bld: 91 mg/dL (ref 65–99)
POTASSIUM: 4.7 mmol/L (ref 3.5–5.1)
Sodium: 141 mmol/L (ref 135–145)
Total Bilirubin: 0.5 mg/dL (ref 0.3–1.2)
Total Protein: 6.5 g/dL (ref 6.5–8.1)

## 2016-01-16 LAB — CBC
HEMATOCRIT: 34.7 % — AB (ref 39.0–52.0)
Hemoglobin: 11.5 g/dL — ABNORMAL LOW (ref 13.0–17.0)
MCH: 33.5 pg (ref 26.0–34.0)
MCHC: 33.1 g/dL (ref 30.0–36.0)
MCV: 101.2 fL — AB (ref 78.0–100.0)
Platelets: 240 10*3/uL (ref 150–400)
RBC: 3.43 MIL/uL — ABNORMAL LOW (ref 4.22–5.81)
RDW: 13.4 % (ref 11.5–15.5)
WBC: 7.3 10*3/uL (ref 4.0–10.5)

## 2016-01-16 LAB — TSH: TSH: 0.672 u[IU]/mL (ref 0.350–4.500)

## 2016-01-16 LAB — TROPONIN I: Troponin I: 0.03 ng/mL (ref <0.031)

## 2016-01-16 LAB — MAGNESIUM: Magnesium: 1.4 mg/dL — ABNORMAL LOW (ref 1.7–2.4)

## 2016-01-16 LAB — C DIFFICILE QUICK SCREEN W PCR REFLEX
C DIFFICILE (CDIFF) INTERP: NEGATIVE
C Diff antigen: NEGATIVE
C Diff toxin: NEGATIVE

## 2016-01-16 MED ORDER — SODIUM CHLORIDE 0.9 % IV SOLN
INTRAVENOUS | Status: DC
  Administered 2016-01-16: 04:00:00 via INTRAVENOUS

## 2016-01-16 MED ORDER — ONDANSETRON HCL 4 MG/2ML IJ SOLN: 4.0000 mg | Freq: Four times a day (QID) | INTRAMUSCULAR | Status: DC | PRN

## 2016-01-16 MED ORDER — ENOXAPARIN SODIUM 40 MG/0.4ML ~~LOC~~ SOLN
40.0000 mg | SUBCUTANEOUS | Status: DC
  Administered 2016-01-16 – 2016-01-18 (×3): 40 mg via SUBCUTANEOUS
  Filled 2016-01-16 (×3): qty 0.4

## 2016-01-16 MED ORDER — FOLIC ACID 1 MG PO TABS
1.0000 mg | ORAL_TABLET | Freq: Every day | ORAL | Status: DC
  Administered 2016-01-16 – 2016-01-18 (×3): 1 mg via ORAL
  Filled 2016-01-16 (×3): qty 1

## 2016-01-16 MED ORDER — BOOST / RESOURCE BREEZE PO LIQD
1.0000 | Freq: Three times a day (TID) | ORAL | Status: DC
  Administered 2016-01-16 – 2016-01-18 (×7): 1 via ORAL

## 2016-01-16 MED ORDER — ACETAMINOPHEN 325 MG PO TABS: 650.0000 mg | ORAL_TABLET | Freq: Four times a day (QID) | ORAL | Status: DC | PRN

## 2016-01-16 MED ORDER — ONDANSETRON HCL 4 MG PO TABS: 4.0000 mg | ORAL_TABLET | Freq: Four times a day (QID) | ORAL | Status: DC | PRN

## 2016-01-16 MED ORDER — ACETAMINOPHEN 650 MG RE SUPP: 650.0000 mg | Freq: Four times a day (QID) | RECTAL | Status: DC | PRN

## 2016-01-16 MED ORDER — PRIMIDONE 250 MG PO TABS
500.0000 mg | ORAL_TABLET | Freq: Two times a day (BID) | ORAL | Status: DC
  Administered 2016-01-16 – 2016-01-18 (×5): 500 mg via ORAL
  Filled 2016-01-16 (×7): qty 2

## 2016-01-16 MED ORDER — HYDROCODONE-ACETAMINOPHEN 5-325 MG PO TABS: 1.0000 | ORAL_TABLET | ORAL | Status: DC | PRN

## 2016-01-16 MED ORDER — SODIUM CHLORIDE 0.9% FLUSH
3.0000 mL | Freq: Two times a day (BID) | INTRAVENOUS | Status: DC
  Administered 2016-01-17 – 2016-01-18 (×2): 3 mL via INTRAVENOUS

## 2016-01-16 MED ORDER — LEVETIRACETAM 500 MG PO TABS
500.0000 mg | ORAL_TABLET | Freq: Two times a day (BID) | ORAL | Status: DC
  Administered 2016-01-16 – 2016-01-18 (×5): 500 mg via ORAL
  Filled 2016-01-16 (×5): qty 1

## 2016-01-16 MED ORDER — SODIUM CHLORIDE 0.9 % IV SOLN
INTRAVENOUS | Status: DC
  Administered 2016-01-16 – 2016-01-17 (×2): via INTRAVENOUS

## 2016-01-16 MED ORDER — MAGNESIUM SULFATE 2 GM/50ML IV SOLN
2.0000 g | Freq: Once | INTRAVENOUS | Status: AC
  Administered 2016-01-16: 2 g via INTRAVENOUS
  Filled 2016-01-16: qty 50

## 2016-01-16 NOTE — H&P (Addendum)
PCP:  No PCP Per Patient  Neurology Elliot Gurney Pyrtle  Referring provider Jarrett Soho   Chief Complaint:  Nausea Diarrhea   HPI: [name] is a 60 y.o. 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   Presented with nausea vomiting diarrhea for the past 3 days and today episode of syncope while patient was trying to put on his pants and stood up from sitting position. This was a witnessed fall patient fell down hitting his head with laceration above left eye needing stitches. He has been feeling lightheaded when he stands up when EMS arrived blood pressure 92/56 heart rate 110 satting 97% on room air CBG 174. Patient does not endorse any chest pain or shortness of breath no fever. Of note patient has been admitted 1 months ago with small bowel obstruction and Crohn's exacerbation during that admission he was on antibiotics, He reports no sick contacts Patient currently is in [place] ER: Potassium 5.2 (initial potassium was 6.8 but sample was hemolyzed) creatinine was noted to be elevated to 2.14 after IV fluid rate administration was down to 1.6. Troponin within normal limits White blood cell count 11.7 hemoglobin 13.5 platelets 245 CT head showing no evidence of acute infarction soft tissue swelling hematoma over left frontal bone CT neck showing normal cervical lordosis no evidence of fracture dislocation  Regarding pertinent past history: History of seizure disorder with partial seizures. Patient has bilateral lower extremity numbness is chronic. 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 Lucia Gaskins discharged to nursing home facility with dysphagia 2 diet patient has history of dysphagia and had been having cough during meals. In the past had a discussion regarding PEG but decided to approach [place] diet changes. Regarding Crohn's disease patient was on sole thousand originally but this has been discontinued ever since his surgery given inflamed bowel has been resected the plan was for patient to follow-up with GI  Hospitalist was called for admission for gastroenteritis resulting in dehydration  Review of Systems:    Pertinent positives include: abdominal pain, nausea, vomiting, diarrhea,   Constitutional:  No weight loss, night sweats, Fevers, chills, fatigue, weight loss  HEENT:  No headaches, Difficulty swallowing,Tooth/dental problems,Sore throat,  No sneezing, itching, ear ache, nasal congestion, post nasal drip,  Cardio-vascular:  No chest pain, Orthopnea, PND, anasarca, dizziness, palpitations.no Bilateral lower extremity swelling  GI:  No heartburn, indigestion, change in bowel habits, loss of appetite, melena, blood in stool, hematemesis Resp:  no shortness of breath at rest. No dyspnea on exertion, No excess mucus, no productive cough, No non-productive cough, No coughing up of blood.No change in color of mucus.No wheezing. Skin:  no rash or lesions. No jaundice GU:  no dysuria, change in color of urine, no urgency or frequency. No straining to urinate.  No flank pain.  Musculoskeletal:  No joint pain or no joint swelling. No decreased range of motion. No back pain.  Psych:  No change in mood or  affect. No depression or anxiety. No memory loss.  Neuro: no localizing neurological complaints, no tingling, no weakness, no  double vision, no gait abnormality, no slurred speech, no confusion  Otherwise ROS are negative except for above, 10 systems were reviewed  Past Medical History: Past Medical History  Diagnosis Date  . Other B-complex deficiencies   . Iron deficiency anemia, unspecified   . Abscess of anal and rectal regions   . Crohn's disease ([place])   . Mental disability   . Heart murmur   . History of blood transfusion ~ 1992    "related to my Crohn's"  . History of stomach ulcers   . Epilepsy ([place])     "petit mal; only at home" (6/9//2015)  . Syncope and collapse 03/31/2014    "while driving"  . Hyperplastic colon polyp   . REGIONAL ENTERITIS, LARGE INTESTINE 10/05/2000    Qualifier: Diagnosis of  By: Jerral Ralph    . Syncope 03/31/2014  . FISTULA, INTESTINE 01/29/2008    Qualifier: Diagnosis of  By: Jerral Ralph     Past Surgical History  Procedure Laterality Date  . Hemicolectomy  1985    ileocectomy   . Colonoscopy  2013, 2016  . Umbilical hernia repair  ~ 1983  . Tonsillectomy  ~ 1967  . Hernia repair    . Colon surgery      Anastomosis 45cm from anal verge c/w deswecending colon anastomosis  . Pilonidal cyst excision  1980's  . Laparoscopy N/A 12/04/2015    Procedure: LAPAROSCOPY DIAGNOSTIC, LAPAROSCOPIC INTEROLYSIS OF ADHESIONS, HAND- ASSISTED SMALL BOWEL RESECTION, MOBILIZATION OF SPLENIC FLEXURE, LEFT COLECTOMY WITH CREATION OF COLOSTOMY (HARTMAN'S PROCEDURE);  Surgeon: Alphonsa Overall, MD;  Location: [facility];  Service: General;  Laterality: N/A;     Medications: Prior to Admission medications   Medication Sig Start Date End Date Taking? Authorizing Provider  acetaminophen (TYLENOL) 325 MG tablet Take 2 tablets (650 mg total) by mouth every 6 (six) hours as needed for mild pain (or Fever >/= 101). 12/14/15  Yes Albertine Patricia, MD  folic acid (FOLVITE) 1 MG tablet Take 1 mg by mouth daily.   Yes Historical Provider, MD  levETIRAcetam (KEPPRA) 500 MG tablet Take 1  tablet (500 mg total) by mouth 2 (two) times daily. 09/29/15  Yes Cameron Sprang, MD  magic mouthwash SOLN Take 15 mLs by mouth 4 (four) times daily as needed for mouth pain (sore throat). 12/14/15  Yes Albertine Patricia, MD  Nutritional Supplements (NUTRA SHAKE PO) Take 1 Container by mouth 2 (two) times daily.   Yes Historical Provider, MD  ondansetron (ZOFRAN) 4 MG tablet Take 1 tablet (4 mg total) by mouth every 6 (six) hours as needed for nausea. 12/14/15  Yes Albertine Patricia, MD  primidone (MYSOLINE) 250 MG tablet Take 2 tabs twice a day Patient taking differently: Take 500 mg by mouth 2 (two) times daily. Take 2 tabs twice a day 09/29/15  Yes Cameron Sprang, MD  feeding supplement (BOOST / RESOURCE BREEZE) LIQD Take 1 Container by mouth 3 (three) times daily with meals. 12/14/15   Silver Huguenin Elgergawy, MD  feeding supplement, ENSURE ENLIVE, (ENSURE ENLIVE) LIQD Take 237 mLs by mouth 3 (three) times daily between meals. 12/14/15   Silver Huguenin Elgergawy, MD  oxyCODONE (OXY IR/ROXICODONE) 5 MG immediate release tablet Take 1-2 tablets (5-10 mg total) by mouth every 6 (six) hours as needed for severe pain. 12/14/15   Albertine Patricia, MD  sulfaSALAzine (  AZULFIDINE) 500 MG tablet Take 1 tablet (500 mg total) by mouth daily. 01/07/16   Jerene Bears, MD    Allergies:  No Known Allergies  Social History:  Ambulatory  independently     From facility [facility] center   reports that he has never smoked. He has never used smokeless tobacco. He reports that he does not drink alcohol or use illicit drugs.     Family History: family history includes Skin cancer in his father. There is no history of Seizures or Crohn's disease.    Physical Exam: Patient Vitals for the past 24 hrs:  BP Temp Temp src Pulse Resp SpO2 Height Weight  01/15/16 2201 110/59 mmHg - - 103 23 99 % - -  01/15/16 2200 110/59 mmHg - - 103 24 98 % - -  01/15/16 2030 (!) 94/52 mmHg - - 103 19 100 % - -  01/15/16 2000  (!) 96/49 mmHg - - 103 21 97 % - -  01/15/16 1958 105/56 mmHg 98.3 F (36.8 C) Oral 105 24 98 % 5' 10"  (1.778 m) 95.255 kg (210 lb)    1. General:  in No Acute distress 2. Psychological: Alert and  Oriented 3. Head/ENT:     Dry Mucous Membranes                          Head traumatic left side of the face with laceration, neck supple                          Poor Dentition 4. SKIN:  decreased Skin turgor,  Skin clean Dry and intact no rash 5. Heart: Rapid but Regular rate and rhythm no  Murmur, Rub or gallop 6. Lungs:  Clear to auscultation bilaterally, no wheezes or crackles   7. Abdomen: Soft, non-tender, Non distended, colostomy present 8. Lower extremities: no clubbing, cyanosis, or edema 9. Neurologically Grossly intact, moving all 4 extremities equally 10. MSK: Normal range of motion  body mass index is 30.13 kg/(m^2).   Labs on Admission:   Results for orders placed or performed during the hospital encounter of 01/15/16 (from the past 24 hour(s))  CBC with Differential     Status: Abnormal   Collection Time: 01/15/16  8:20 PM  Result Value Ref Range   WBC 11.7 (H) 4.0 - 10.5 K/uL   RBC 3.97 (L) 4.22 - 5.81 MIL/uL   Hemoglobin 13.5 13.0 - 17.0 g/dL   HCT 40.4 39.0 - 52.0 %   MCV 101.8 (H) 78.0 - 100.0 fL   MCH 34.0 26.0 - 34.0 pg   MCHC 33.4 30.0 - 36.0 g/dL   RDW 13.3 11.5 - 15.5 %   Platelets 245 150 - 400 K/uL   Neutrophils Relative % 90 %   Neutro Abs 10.6 (H) 1.7 - 7.7 K/uL   Lymphocytes Relative 2 %   Lymphs Abs 0.3 (L) 0.7 - 4.0 K/uL   Monocytes Relative 8 %   Monocytes Absolute 0.9 0.1 - 1.0 K/uL   Eosinophils Relative 0 %   Eosinophils Absolute 0.0 0.0 - 0.7 K/uL   Basophils Relative 0 %   Basophils Absolute 0.0 0.0 - 0.1 K/uL  Comprehensive metabolic panel     Status: Abnormal   Collection Time: 01/15/16  8:20 PM  Result Value Ref Range   Sodium 136 135 - 145 mmol/L   Potassium 6.8 (HH) 3.5 - 5.1 mmol/L  Chloride 106 101 - 111 mmol/L   CO2 20 (L)  22 - 32 mmol/L   Glucose, Bld 119 (H) 65 - 99 mg/dL   BUN 16 6 - 20 mg/dL   Creatinine, Ser 2.14 (H) 0.61 - 1.24 mg/dL   Calcium 8.5 (L) 8.9 - 10.3 mg/dL   Total Protein 7.0 6.5 - 8.1 g/dL   Albumin 2.8 (L) 3.5 - 5.0 g/dL   AST 46 (H) 15 - 41 U/L   ALT 16 (L) 17 - 63 U/L   Alkaline Phosphatase 93 38 - 126 U/L   Total Bilirubin 1.3 (H) 0.3 - 1.2 mg/dL   GFR calc non Af Amer 32 (L) >60 mL/min   GFR calc Af Amer 37 (L) >60 mL/min   Anion gap 10 5 - 15  Lipase, blood     Status: None   Collection Time: 01/15/16  8:20 PM  Result Value Ref Range   Lipase 49 11 - 51 U/L  I-stat troponin, ED     Status: None   Collection Time: 01/15/16 11:22 PM  Result Value Ref Range   Troponin i, poc 0.02 0.00 - 0.08 ng/mL   Comment 3          I-stat chem 8, ed     Status: Abnormal   Collection Time: 01/15/16 11:24 PM  Result Value Ref Range   Sodium 139 135 - 145 mmol/L   Potassium 5.2 (H) 3.5 - 5.1 mmol/L   Chloride 113 (H) 101 - 111 mmol/L   BUN 18 6 - 20 mg/dL   Creatinine, Ser 1.60 (H) 0.61 - 1.24 mg/dL   Glucose, Bld 83 65 - 99 mg/dL   Calcium, Ion 0.94 (L) 1.13 - 1.30 mmol/L   TCO2 15 0 - 100 mmol/L   Hemoglobin 13.6 13.0 - 17.0 g/dL   HCT 40.0 39.0 - 52.0 %    UA Not obtained  Lab Results  Component Value Date   HGBA1C 5.1 03/31/2014    Estimated Creatinine Clearance: 56.2 mL/min (by C-G formula based on Cr of 1.6).  BNP (last 3 results) No results for input(s): PROBNP in the last 8760 hours.  Other results:  I have pearsonaly reviewed this: ECG REPORT  Rate:103  Rhythm:   sinus tachycardia ST&T Change: No ischemic changes QTC 319  Filed Weights   01/15/16 1958  Weight: 95.255 kg (210 lb)     Cultures:    Component Value Date/Time   SDES URINE, RANDOM 12/07/2015 1225   SPECREQUEST NONE 12/07/2015 1225   CULT  12/07/2015 1225    NO GROWTH 2 DAYS Performed at [facility] 12/09/2015 FINAL 12/07/2015 1225     Radiological Exams on  Admission: Ct Head Wo Contrast  01/15/2016  CLINICAL DATA:  Syncope, fall, small laceration over left eye EXAM: CT HEAD WITHOUT CONTRAST CT CERVICAL SPINE WITHOUT CONTRAST TECHNIQUE: Multidetector CT imaging of the head and cervical spine was performed following the standard protocol without intravenous contrast. Multiplanar CT image reconstructions of the cervical spine were also generated. COMPARISON:  CT head/ cervical spine dated 04/10/2014 FINDINGS: CT HEAD FINDINGS No evidence of parenchymal hemorrhage or extra-axial fluid collection. No mass lesion, mass effect, or midline shift. No CT evidence of acute infarction. Mild subcortical white matter and periventricular small vessel ischemic changes. Mild cortical atrophy.  No ventriculomegaly. The visualized paranasal sinuses are essentially clear. Minimal partial opacification the left mastoid air cells (series 4/image 9). Mild soft tissue swelling/hematoma overlying the  left frontal bone (series 3/image 20). No evidence of calvarial fracture. CT CERVICAL SPINE FINDINGS Normal cervical lordosis. No evidence of fracture or dislocation. Vertebra body heights are maintained. Dens appears intact. No prevertebral soft tissue swelling. Moderate degenerative changes with stable bulky anterior osteophytosis. Visualized thyroid is notable for stable multinodular goiter, including a dominant 1.8 cm right thyroid nodule (series 4/image 92), unchanged and previously biopsied. Visualized lung apices are clear. IMPRESSION: Mild soft tissue swelling/hematoma overlying the left frontal bone. No evidence of calvarial fracture. No evidence of acute intracranial abnormality. Mild atrophy with small vessel ischemic changes. No evidence of traumatic injury to the cervical spine. Moderate degenerative changes with bulky anterior osteophytosis. Electronically Signed   By: Julian Hy M.D.   On: 01/15/2016 21:50   Ct Cervical Spine Wo Contrast  01/15/2016  CLINICAL DATA:   Syncope, fall, small laceration over left eye EXAM: CT HEAD WITHOUT CONTRAST CT CERVICAL SPINE WITHOUT CONTRAST TECHNIQUE: Multidetector CT imaging of the head and cervical spine was performed following the standard protocol without intravenous contrast. Multiplanar CT image reconstructions of the cervical spine were also generated. COMPARISON:  CT head/ cervical spine dated 04/10/2014 FINDINGS: CT HEAD FINDINGS No evidence of parenchymal hemorrhage or extra-axial fluid collection. No mass lesion, mass effect, or midline shift. No CT evidence of acute infarction. Mild subcortical white matter and periventricular small vessel ischemic changes. Mild cortical atrophy.  No ventriculomegaly. The visualized paranasal sinuses are essentially clear. Minimal partial opacification the left mastoid air cells (series 4/image 9). Mild soft tissue swelling/hematoma overlying the left frontal bone (series 3/image 20). No evidence of calvarial fracture. CT CERVICAL SPINE FINDINGS Normal cervical lordosis. No evidence of fracture or dislocation. Vertebra body heights are maintained. Dens appears intact. No prevertebral soft tissue swelling. Moderate degenerative changes with stable bulky anterior osteophytosis. Visualized thyroid is notable for stable multinodular goiter, including a dominant 1.8 cm right thyroid nodule (series 4/image 92), unchanged and previously biopsied. Visualized lung apices are clear. IMPRESSION: Mild soft tissue swelling/hematoma overlying the left frontal bone. No evidence of calvarial fracture. No evidence of acute intracranial abnormality. Mild atrophy with small vessel ischemic changes. No evidence of traumatic injury to the cervical spine. Moderate degenerative changes with bulky anterior osteophytosis. Electronically Signed   By: Julian Hy M.D.   On: 01/15/2016 21:50    Chart has been reviewed  Family not at  Bedside   Assessment/Plan  60 year old gentleman history of Crohn's disease  status post recent resection presents of nausea vomiting diarrhea salting dehydration and what appears to be orthostatic hypotension resulting in syncope and head injury  Present on Admission:  . ARF (acute renal failure) ([place]) a setting of dehydration will administer IV fluids check urine electrolytes  . Syncope likely secondary to dehydration and orthostatic hypotension will rehydrate check orthostatics in the morning to document improvement will have PT OT evaluate prior to discharge. Given advanced age obtain echogram and carotid Dopplers  . Localization-related idiopathic epilepsy and epileptic syndromes with seizures of localized onset, not intractable, without status epilepticus ([place]) - continue keppra, currently stable . Crohn's disease of both small and large intestine with fistula ([place]) status post recent resection of inflamed intestine.  [name] SIRS (systemic inflammatory response syndrome) ([facility]) given elevated white blood cell count tachycardia worrisome for possible SiRS no evidence of severe sepsis rehydrate and monitor early afebrile    . Gastroenteritis obtain stool cultures and stool PCR  . Dehydration administer IV fluids and full creatinine   Prophylaxis:  Lovenox   CODE STATUS:  FULL CODE  as per patient   Disposition:   Back to current facility when stable                          Other plan as per orders.  I have spent a total of 56 min on this admission   Aubriee Szeto 01/16/2016, 12:58 AM    [facility]  Pager [phone]   after 2 AM please page floor coverage PA If 7AM-7PM, please contact the day team taking care of the patient  [email or web address]  Password [email or web address]

## 2016-01-16 NOTE — Progress Notes (Signed)
CRITICAL VALUE ALERT  Critical value received: GIP by PCR, stool  Date of notification:  01/16/2016  Time of notification: 5:06 PM  Critical value read back:Yes.    Nurse who received alert:  Dickey Gave  MD notified (1st page):  Dr. Conley Canal  Time of first page: %:08  MD notified (2nd page):  Time of second page:  Responding MD:  Dr. Conley Canal  Time MD responded:  5:10 pm

## 2016-01-16 NOTE — ED Notes (Signed)
Report attempted 

## 2016-01-16 NOTE — Progress Notes (Addendum)
Patient admitted after midnight. Chart reviewed. Patient examined. Syncope secondary to volume depletion as evidenced by history and markedly elevated creatinine from baseline. Carotid Doppler and echo would be low yield. Will discontinue. Creatinine down to 1.6. Will continue IV fluids until the creatinine back to baseline which is below 1. Vomiting has resolved. Still with watery voluminous output from colostomy. Abdominal wound dressing soaked and soiled with stool. Someone placed a washcloth beside the colostomy bag which is soiled and soaked. Notified nursing staff to change dressing more frequently. Have consulted wound ostomy nurse. Physical therapy evaluation pending. Had been on dysphagia diet. Will get speech therapy to follow-up, see if he can be advanced. Stool for C. difficile negative. GI pathogen panel pending.  Doree Barthel, MD [facility] [email or web address] password [email or web address]

## 2016-01-16 NOTE — Evaluation (Signed)
Physical Therapy Evaluation Patient Details Name: [name] MRN: [id number] DOB: Mar 13, 1956 Today's Date: 01/16/2016   History of Present Illness  Patient is a 60 yo male admitted 01/15/16 following syncopal episode with fall and laceration above Lt eye.  Patient with N/V and diarrhea x 3 days, dehydration, hypotension, ARF.   PMH:  Crohn's disease, colostomy, midline wound, mental disability, epilepsy, syncope/collapse, BLE numbness  Clinical Impression  Patient presents with problems listed below.  Will benefit from acute PT to maximize functional mobility prior to discharge.  Session limited today due to lightheadedness/dizziness.  Recommend HHPT for continued therapy when patient returns to ALF.    Follow Up Recommendations Home health PT;Supervision/Assistance - 24 hour (At ALF)    Equipment Recommendations  Other (comment) (TBD - may need RW)    Recommendations for Other Services       Precautions / Restrictions Precautions Precautions: Fall Precaution Comments: Colostomy - leaking Restrictions Weight Bearing Restrictions: No      Mobility  Bed Mobility Overal bed mobility: Needs Assistance Bed Mobility: Rolling;Sit to Supine;Sidelying to Sit Rolling: Min assist Sidelying to sit: Min assist   Sit to supine: Min assist   General bed mobility comments: Verbal cues to move to EOB.  Min assist to raise trunk to upright sitting position.  Once upright, patient became dizzy, requiring assist for balance.  Sat x 60 seconds with dizziness ongoing.  Patient reports he needs to lay down.  Returned to supine with min assist.  Colostomy bag leaking.  Patient able to initiate rolling, but required min assist to complete roll onto side to be cleaned.  Transfers                 General transfer comment: Declined/unable to tolerate  Ambulation/Gait                Stairs            Wheelchair Mobility    Modified Rankin (Stroke Patients Only)        Balance Overall balance assessment: Needs assistance Sitting-balance support: Bilateral upper extremity supported;Feet supported Sitting balance-Leahy Scale: Poor Sitting balance - Comments: Required assist for balance primarily due to dizziness.                                     Pertinent Vitals/Pain Pain Assessment: No/denies pain    Home Living Family/patient expects to be discharged to:: Assisted living               Home Equipment: None      Prior Function Level of Independence: Needs assistance   Gait / Transfers Assistance Needed: Per chart, patient ambulatory with no assistive device.  ADL's / Homemaking Assistance Needed: Per patient, facility provides meals and assists him with ADL's. Called patient's contacts to verify above [name] and [place]).  They asked for PT recommendations - informed them PT was recommending HHPT.       Hand Dominance        Extremity/Trunk Assessment   Upper Extremity Assessment: Overall WFL for tasks assessed           Lower Extremity Assessment: Generalized weakness         Communication   Communication: No difficulties  Cognition Arousal/Alertness: Awake/alert Behavior During Therapy: Anxious;Flat affect Overall Cognitive Status: History of cognitive impairments - at baseline  General Comments      Exercises        Assessment/Plan    PT Assessment Patient needs continued PT services  PT Diagnosis Difficulty walking;Generalized weakness;Altered mental status (Dizziness/lightheadedness)   PT Problem List Decreased strength;Decreased activity tolerance;Decreased balance;Decreased mobility;Decreased cognition;Decreased knowledge of use of DME  PT Treatment Interventions DME instruction;Gait training;Functional mobility training;Therapeutic activities;Cognitive remediation;Patient/family education;Therapeutic exercise;Balance training   PT Goals (Current goals can be  found in the Care Plan section) Acute Rehab PT Goals Patient Stated Goal: None stated PT Goal Formulation: With patient Time For Goal Achievement: 01/23/16 Potential to Achieve Goals: Good    Frequency Min 3X/week   Barriers to discharge        Co-evaluation               End of Session   Activity Tolerance: Patient limited by fatigue (Limited due to dizziness) Patient left: in bed;with call bell/phone within reach Nurse Communication: Mobility status (Colostomy leaking.)    Functional Assessment Tool Used: Clinical judgement Functional Limitation: Mobility: Walking and moving around Mobility: Walking and Moving Around Current Status (R0076): At least 20 percent but less than 40 percent impaired, limited or restricted Mobility: Walking and Moving Around Goal Status [phone]): At least 1 percent but less than 20 percent impaired, limited or restricted    Time: 3545-6256 PT Time Calculation (min) (ACUTE ONLY): 20 min   Charges:   PT Evaluation $PT Eval Moderate Complexity: 1 Procedure     PT G Codes:   PT G-Codes **NOT FOR INPATIENT CLASS** Functional Assessment Tool Used: Clinical judgement Functional Limitation: Mobility: Walking and moving around Mobility: Walking and Moving Around Current Status (L8937): At least 20 percent but less than 40 percent impaired, limited or restricted Mobility: Walking and Moving Around Goal Status [phone]): At least 1 percent but less than 20 percent impaired, limited or restricted    Despina Pole 01/16/2016, 1:16 PM Carita Pian. Sanjuana Kava, [place] Pager [phone]

## 2016-01-17 ENCOUNTER — Observation Stay (HOSPITAL_COMMUNITY): Payer: Medicare Other

## 2016-01-17 DIAGNOSIS — R55 Syncope and collapse: Secondary | ICD-10-CM | POA: Diagnosis not present

## 2016-01-17 DIAGNOSIS — K50813 Crohn's disease of both small and large intestine with fistula: Secondary | ICD-10-CM

## 2016-01-17 DIAGNOSIS — K50019 Crohn's disease of small intestine with unspecified complications: Secondary | ICD-10-CM | POA: Diagnosis not present

## 2016-01-17 DIAGNOSIS — E860 Dehydration: Secondary | ICD-10-CM

## 2016-01-17 DIAGNOSIS — K529 Noninfective gastroenteritis and colitis, unspecified: Secondary | ICD-10-CM | POA: Diagnosis not present

## 2016-01-17 DIAGNOSIS — N179 Acute kidney failure, unspecified: Secondary | ICD-10-CM

## 2016-01-17 LAB — BASIC METABOLIC PANEL
Anion gap: 7 (ref 5–15)
BUN: 14 mg/dL (ref 6–20)
CALCIUM: 8.5 mg/dL — AB (ref 8.9–10.3)
CO2: 17 mmol/L — AB (ref 22–32)
CREATININE: 1.21 mg/dL (ref 0.61–1.24)
Chloride: 115 mmol/L — ABNORMAL HIGH (ref 101–111)
GLUCOSE: 94 mg/dL (ref 65–99)
Potassium: 4.5 mmol/L (ref 3.5–5.1)
Sodium: 139 mmol/L (ref 135–145)

## 2016-01-17 MED ORDER — MAGNESIUM OXIDE 400 (241.3 MG) MG PO TABS
200.0000 mg | ORAL_TABLET | Freq: Two times a day (BID) | ORAL | Status: DC
  Administered 2016-01-17 – 2016-01-18 (×3): 200 mg via ORAL
  Filled 2016-01-17 (×2): qty 1

## 2016-01-17 NOTE — Progress Notes (Signed)
[facility] PROGRESS NOTE  [name] MLY:[id number] DOB: 01/02/1956 DOA: 01/15/2016 PCP: No PCP Per Patient  Assessment/Plan: 60 y/o male with PMH of Mental Disability, Seizures, Crohn's Disease s/p (h/o left colonic fistula, small bowel resection as well as left hemicolectomy with Hartmann pouch placement in 12/04/2015) presented with syncope. Admitted with dehydration, syncope, AKI  Syncope. Probable due to dehydration. Pt was dehydrated, mild hypotensive on admission. Neuro exam is non focal. CT head: No evidence of acute intracranial abnormality -we will cont IV hydration. Also check echo due to heart murmur. Also check UA  AKI dehydration on admission. Cr 2.1. renal function is improving with IVF. Replace Mg   Cough. obtain eCXR  Crohn's Disease s/p (h/o left colonic fistula, small bowel resection as well as left hemicolectomy with Hartmann pouch placement in 12/04/2015) -no vomiting, monitor colostomy output.     Code Status: full Family Communication: d/w patient (indicate person spoken with, relationship, and if by phone, the number) Disposition Plan:  24-48 hrs    Consultants:  none  Procedures:  Pend echo  Antibiotics:   none (indicate start date, and stop date if known)  HPI/Subjective: Alert, no distress, some cough   Objective: Filed Vitals:   01/16/16 2003 01/17/16 0817  BP: 98/54 134/48  Pulse: 87 83  Temp: 97.7 F (36.5 C) 98.1 F (36.7 C)  Resp: 16     Intake/Output Summary (Last 24 hours) at 01/17/16 0942 Last data filed at 01/17/16 0300  Gross per 24 hour  Intake   1780 ml  Output   1375 ml  Net    405 ml   Filed Weights   01/15/16 1958 01/16/16 0417  Weight: 95.255 kg (210 lb) 86.637 kg (191 lb)    Exam:   General:  Comfortable   Cardiovascular: C1,E7 systolic mr  Respiratory: no wheezing   Abdomen: soft, ostomy +  Musculoskeletal: no leg edema    Data Reviewed: Basic Metabolic Panel:  Recent Labs Lab  01/15/16 2020 01/15/16 2324 01/16/16 0615 01/17/16 0440  NA 136 139 141 139  K 6.8* 5.2* 4.7 4.5  CL 106 113* 112* 115*  CO2 20*  --  21* 17*  GLUCOSE 119* 83 91 94  BUN 16 18 15 14   CREATININE 2.14* 1.60* 1.60* 1.21  CALCIUM 8.5*  --  8.1* 8.5*  MG  --   --  1.4*  --   PHOS  --   --  2.9  --    Liver Function Tests:  Recent Labs Lab 01/15/16 2020 01/16/16 0615  AST 46* 33  ALT 16* 16*  ALKPHOS 93 85  BILITOT 1.3* 0.5  PROT 7.0 6.5  ALBUMIN 2.8* 2.5*    Recent Labs Lab 01/15/16 2020  LIPASE 49   No results for input(s): AMMONIA in the last 168 hours. CBC:  Recent Labs Lab 01/15/16 2020 01/15/16 2324 01/16/16 0615  WBC 11.7*  --  7.3  NEUTROABS 10.6*  --   --   HGB 13.5 13.6 11.5*  HCT 40.4 40.0 34.7*  MCV 101.8*  --  101.2*  PLT 245  --  240   Cardiac Enzymes:  Recent Labs Lab 01/16/16 0615  TROPONINI 0.03   BNP (last 3 results) No results for input(s): BNP in the last 8760 hours.  ProBNP (last 3 results) No results for input(s): PROBNP in the last 8760 hours.  CBG: No results for input(s): GLUCAP in the last 168 hours.  Recent Results (from the past 240 hour(s))  Gastrointestinal Panel by PCR , Stool     Status: Abnormal   Collection Time: 01/16/16  2:51 AM  Result Value Ref Range Status   Campylobacter species NOT DETECTED NOT DETECTED Final   Plesimonas shigelloides NOT DETECTED NOT DETECTED Final   Salmonella species NOT DETECTED NOT DETECTED Final   Yersinia enterocolitica NOT DETECTED NOT DETECTED Final   Vibrio species NOT DETECTED NOT DETECTED Final   Vibrio cholerae NOT DETECTED NOT DETECTED Final   Enteroaggregative E coli (EAEC) NOT DETECTED NOT DETECTED Final   Enteropathogenic E coli (EPEC) NOT DETECTED NOT DETECTED Final   Enterotoxigenic E coli (ETEC) NOT DETECTED NOT DETECTED Final   Shiga like toxin producing E coli (STEC) NOT DETECTED NOT DETECTED Final   E. coli O157 NOT DETECTED NOT DETECTED Final    Shigella/Enteroinvasive E coli (EIEC) NOT DETECTED NOT DETECTED Final   Cryptosporidium NOT DETECTED NOT DETECTED Final   Cyclospora cayetanensis NOT DETECTED NOT DETECTED Final   Entamoeba histolytica NOT DETECTED NOT DETECTED Final   Giardia lamblia NOT DETECTED NOT DETECTED Final   Adenovirus F40/41 NOT DETECTED NOT DETECTED Final   Astrovirus NOT DETECTED NOT DETECTED Final   Norovirus GI/GII DETECTED (A) NOT DETECTED Final    Comment: CRITICAL RESULT CALLED TO, READ BACK BY AND VERIFIED WITH: CHRISTINE VILLANUEVA,RN 01/16/2016 1702 BY J RAZZAKSUAREZ,MT    Rotavirus A NOT DETECTED NOT DETECTED Final   Sapovirus (I, II, IV, and V) NOT DETECTED NOT DETECTED Final  C difficile quick scan w PCR reflex     Status: None   Collection Time: 01/16/16  2:51 AM  Result Value Ref Range Status   C Diff antigen NEGATIVE NEGATIVE Final   C Diff toxin NEGATIVE NEGATIVE Final   C Diff interpretation Negative for toxigenic C. difficile  Final     Studies: Ct Head Wo Contrast  01/15/2016  CLINICAL DATA:  Syncope, fall, small laceration over left eye EXAM: CT HEAD WITHOUT CONTRAST CT CERVICAL SPINE WITHOUT CONTRAST TECHNIQUE: Multidetector CT imaging of the head and cervical spine was performed following the standard protocol without intravenous contrast. Multiplanar CT image reconstructions of the cervical spine were also generated. COMPARISON:  CT head/ cervical spine dated 04/10/2014 FINDINGS: CT HEAD FINDINGS No evidence of parenchymal hemorrhage or extra-axial fluid collection. No mass lesion, mass effect, or midline shift. No CT evidence of acute infarction. Mild subcortical white matter and periventricular small vessel ischemic changes. Mild cortical atrophy.  No ventriculomegaly. The visualized paranasal sinuses are essentially clear. Minimal partial opacification the left mastoid air cells (series 4/image 9). Mild soft tissue swelling/hematoma overlying the left frontal bone (series 3/image 20). No  evidence of calvarial fracture. CT CERVICAL SPINE FINDINGS Normal cervical lordosis. No evidence of fracture or dislocation. Vertebra body heights are maintained. Dens appears intact. No prevertebral soft tissue swelling. Moderate degenerative changes with stable bulky anterior osteophytosis. Visualized thyroid is notable for stable multinodular goiter, including a dominant 1.8 cm right thyroid nodule (series 4/image 92), unchanged and previously biopsied. Visualized lung apices are clear. IMPRESSION: Mild soft tissue swelling/hematoma overlying the left frontal bone. No evidence of calvarial fracture. No evidence of acute intracranial abnormality. Mild atrophy with small vessel ischemic changes. No evidence of traumatic injury to the cervical spine. Moderate degenerative changes with bulky anterior osteophytosis. Electronically Signed   By: Julian Hy M.D.   On: 01/15/2016 21:50   Ct Cervical Spine Wo Contrast  01/15/2016  CLINICAL DATA:  Syncope, fall, small laceration over left eye EXAM:  CT HEAD WITHOUT CONTRAST CT CERVICAL SPINE WITHOUT CONTRAST TECHNIQUE: Multidetector CT imaging of the head and cervical spine was performed following the standard protocol without intravenous contrast. Multiplanar CT image reconstructions of the cervical spine were also generated. COMPARISON:  CT head/ cervical spine dated 04/10/2014 FINDINGS: CT HEAD FINDINGS No evidence of parenchymal hemorrhage or extra-axial fluid collection. No mass lesion, mass effect, or midline shift. No CT evidence of acute infarction. Mild subcortical white matter and periventricular small vessel ischemic changes. Mild cortical atrophy.  No ventriculomegaly. The visualized paranasal sinuses are essentially clear. Minimal partial opacification the left mastoid air cells (series 4/image 9). Mild soft tissue swelling/hematoma overlying the left frontal bone (series 3/image 20). No evidence of calvarial fracture. CT CERVICAL SPINE FINDINGS Normal  cervical lordosis. No evidence of fracture or dislocation. Vertebra body heights are maintained. Dens appears intact. No prevertebral soft tissue swelling. Moderate degenerative changes with stable bulky anterior osteophytosis. Visualized thyroid is notable for stable multinodular goiter, including a dominant 1.8 cm right thyroid nodule (series 4/image 92), unchanged and previously biopsied. Visualized lung apices are clear. IMPRESSION: Mild soft tissue swelling/hematoma overlying the left frontal bone. No evidence of calvarial fracture. No evidence of acute intracranial abnormality. Mild atrophy with small vessel ischemic changes. No evidence of traumatic injury to the cervical spine. Moderate degenerative changes with bulky anterior osteophytosis. Electronically Signed   By: Julian Hy M.D.   On: 01/15/2016 21:50    Scheduled Meds: . enoxaparin (LOVENOX) injection  40 mg Subcutaneous Q24H  . feeding supplement  1 Container Oral TID WC  . folic acid  1 mg Oral Daily  . levETIRAcetam  500 mg Oral BID  . primidone  500 mg Oral BID  . sodium chloride flush  3 mL Intravenous Q12H   Continuous Infusions: . sodium chloride 100 mL/hr at 01/17/16 0205    Active Problems:   Crohn's disease ([place])   Seizure disorder (HCC)   Localization-related idiopathic epilepsy and epileptic syndromes with seizures of localized onset, not intractable, without status epilepticus ([place])   ARF (acute renal failure) ([place])   SIRS (systemic inflammatory response syndrome) ([place])   Crohn's disease of both small and large intestine with fistula ([place])   Syncope   Gastroenteritis   Dehydration    Time spent: >35 minutes     Kinnie Feil  [facility] Pager [phone]. If 7PM-7AM, please contact night-coverage at [email or web address], password [facility] 01/17/2016, 9:42 AM

## 2016-01-17 NOTE — Evaluation (Signed)
Occupational Therapy Evaluation Patient Details Name: [name] MRN: [id number] DOB: [birth year] Today's Date: 01/17/2016    History of Present Illness Patient is a 60 yo male admitted 01/15/16 following syncopal episode with fall and laceration above Lt eye.  Patient with N/V and diarrhea x 3 days, dehydration, hypotension, ARF.   PMH:  Crohn's disease, colostomy, midline wound, mental disability, epilepsy, syncope/collapse, BLE numbness   Clinical Impression   PT admitted with syncope with fall. Pt currently with functional limitiations due to the deficits listed below (see OT problem list). POA sister wants patient to return to SNF [facility] after discussion on phone with OT. Pt currently with balance deficits. Pt at baseline ambulates without DME. Pt currently relies on RW and demonstrates safety deficits with RW. Pt will benefit from skilled OT to increase their independence and safety with adls and balance to allow discharge SNF [facility].     Follow Up Recommendations  SNF;Supervision/Assistance - 24 hour    Equipment Recommendations  3 in 1 bedside comode;Hospital bed    Recommendations for Other Services       Precautions / Restrictions Precautions Precautions: Fall Precaution Comments: colostomy Restrictions Weight Bearing Restrictions: No      Mobility Bed Mobility Overal bed mobility: Needs Assistance Bed Mobility: Rolling;Sidelying to Sit Rolling: Supervision Sidelying to sit: Supervision       General bed mobility comments: supervision for safety and cues  Transfers Overall transfer level: Needs assistance Equipment used: Rolling walker (2 wheeled) Transfers: Sit to/from Stand Sit to Stand: Min guard         General transfer comment: cues for hand placement    Balance Overall balance assessment: Needs assistance Sitting-balance support: No upper extremity supported Sitting balance-Leahy Scale: Fair     Standing balance support: Bilateral upper  extremity supported Standing balance-Leahy Scale: Poor Standing balance comment: using rw for support                            ADL Overall ADL's : Needs assistance/impaired   Eating/Feeding Details (indicate cue type and reason): eating restrictions noted from SLP  Grooming: Wash/dry face;Oral care;Standing                               Functional mobility during ADLs: Moderate assistance;Rolling walker General ADL Comments: pt walking into objects in roomw ith RW. pt with lack of awareness to safety with RW. Pt required (A) with BADL at sink level and no correction to errors made. pt now with swallowing precautions and attempting to drink from a cup with thin liquids on counter surface. pt cued by therapist that this liquid was not the right type of drink to safely drink     Vision Additional Comments: edema over L eye from fall   Perception     Praxis      Pertinent Vitals/Pain Pain Assessment: No/denies pain     Hand Dominance Right   Extremity/Trunk Assessment Upper Extremity Assessment Upper Extremity Assessment: Overall WFL for tasks assessed   Lower Extremity Assessment Lower Extremity Assessment: Overall WFL for tasks assessed   Cervical / Trunk Assessment Cervical / Trunk Assessment: Normal   Communication Communication Communication: No difficulties   Cognition Arousal/Alertness: Awake/alert Behavior During Therapy: WFL for tasks assessed/performed Overall Cognitive Status: History of cognitive impairments - at baseline  General Comments       Exercises       Shoulder Instructions      Home Living Family/patient expects to be discharged to:: Assisted living                             Home Equipment: None          Prior Functioning/Environment Level of Independence: Needs assistance    ADL's / Homemaking Assistance Needed: ALF- provides meals and helps him - unable to  verbalize how the facility (A)    Comments: spoke with sister on the phone. Sister reports leaving [facility] to [facility] for 2.5 weeks. [facility] then [facility] because he is unable to manage ostomy bag without RN care. pt at ALF was able to transfer independently and complete basic ADLS independently    OT Diagnosis: Generalized weakness;Cognitive deficits   OT Problem List: Decreased strength;Decreased activity tolerance;Impaired balance (sitting and/or standing);Decreased safety awareness;Decreased cognition;Decreased knowledge of use of DME or AE;Decreased knowledge of precautions   OT Treatment/Interventions: Self-care/ADL training;Therapeutic exercise;DME and/or AE instruction;Therapeutic activities;Cognitive remediation/compensation;Balance training;Patient/family education;Visual/perceptual remediation/compensation    OT Goals(Current goals can be found in the care plan section) Acute Rehab OT Goals Patient Stated Goal: not stated OT Goal Formulation: With patient/family Time For Goal Achievement: 01/31/16 Potential to Achieve Goals: Good  OT Frequency: Min 2X/week   Barriers to D/C: Decreased caregiver support  spoke with sister via phone and agrees to SNF placement. Sister requesting return to [facility] SNF at this time.       Co-evaluation              End of Session Equipment Utilized During Treatment: Gait belt;Rolling walker Nurse Communication: Mobility status;Precautions  Activity Tolerance: Patient tolerated treatment well Patient left: in chair;with call bell/phone within reach;with chair alarm set   Time: 2620-3559 OT Time Calculation (min): 30 min Charges:  OT General Charges $OT Visit: 1 Procedure OT Evaluation $OT Eval Moderate Complexity: 1 Procedure OT Treatments $Self Care/Home Management : 8-22 mins G-Codes: OT G-codes **NOT FOR INPATIENT CLASS** Functional Assessment Tool Used: clinical judgement Functional Limitation: Self  care Self Care Current Status (R4163): At least 20 percent but less than 40 percent impaired, limited or restricted Self Care Goal Status (A4536): At least 20 percent but less than 40 percent impaired, limited or restricted Self Care Discharge Status [phone]): At least 20 percent but less than 40 percent impaired, limited or restricted  Peri Maris 01/17/2016, 3:06 PM    Jeri Modena   OTR/L Pager: [phone] Office: [phone] .

## 2016-01-17 NOTE — Care Management Note (Addendum)
Case Management Note  Patient Details  Name: [name] MRN: [id number] Date of Birth: [birth year]  Subjective/Objective:         Mental Disability, Seizures, Crohn's Disease, syncope          Action/Plan: Discharge Planning:   NCM spoke to pt and states to contact his sister, [name], [phone]. Contacted sister and reviewed [place] letter. Requested an email copy of letter to read over, [email or web address]. Emailed a blank copy of MOON to pt's sister. Unit RN/NCM signed letter was given via phone.   Sister states pt was at [facility] for rehab but met his goals about two weeks ago. She prefers he goes back to ALF. He was transferred to [facility], ALF, contact is [name] [phone]. States she does not know if [facility] can manage him post dc. Requested NCM follow up with [facility]. PT/OT are recommending SNF post dc for additional rehab. CSW referral for possible SNF/ALF placement. Pt will need RW if dc back to [facility] ALF. Contacted [name] at ALF, states they will be able to manage pt at ALF and provide assistance with ADL's. They use [facility] for HH at the facility. CSW referral for ALF. Contacted sister, [name] with update after speaking to Leon will continue to follow to assist with arranging [facility] at ALF.     Expected Discharge Date:                  Expected Discharge Plan:  Skilled Nursing Facility  In-House Referral:  Clinical Social Work  Discharge planning Services  CM Consult  Post Acute Care Choice:  NA Choice offered to:  NA   Status of Service:  In process, will continue to follow  Medicare Important Message Given:    Date Medicare IM Given:    Medicare IM give by:    Date Additional Medicare IM Given:    Additional Medicare Important Message give by:     If discussed at [place] of Stay Meetings, dates discussed:    Additional Comments:  Erenest Rasher, RN 01/17/2016, 4:47 PM

## 2016-01-17 NOTE — Progress Notes (Addendum)
Physical Therapy Treatment Patient Details Name: [name] MRN: [id number] DOB: Apr 26, 1956 Today's Date: 01/17/2016    History of Present Illness Patient is a 60 yo male admitted 01/15/16 following syncopal episode with fall and laceration above Lt eye.  Patient with N/V and diarrhea x 3 days, dehydration, hypotension, ARF.   PMH:  Crohn's disease, colostomy, midline wound, mental disability, epilepsy, syncope/collapse, BLE numbness    PT Comments    Patient able to progress to ambulation during PT session using rw and min guard assistance. Cues provided for safety regarding use of rw. Pt denied any dizziness throughout session. OT reporting that they talked with family and the patient will need to be independent with his basic ADLs at previous ALF and ambulate to the dining hall independently. With this in mind, D/C recommendations have been modified to SNF prior to returning to ALF. PT to continue to follow and progress mobility as tolerated and reinforce safety.    Follow Up Recommendations  SNF; Supervision/Assistance - 24 hour     Equipment Recommendations  Rolling walker with 5" wheels    Recommendations for Other Services       Precautions / Restrictions Precautions Precautions: Fall Precaution Comments: colostomy Restrictions Weight Bearing Restrictions: No    Mobility  Bed Mobility Overal bed mobility: Needs Assistance Bed Mobility: Rolling;Sidelying to Sit Rolling: Supervision Sidelying to sit: Supervision       General bed mobility comments: supervision for safety and cues  Transfers Overall transfer level: Needs assistance Equipment used: Rolling walker (2 wheeled) Transfers: Sit to/from Stand Sit to Stand: Min guard         General transfer comment: cues for hand placement  Ambulation/Gait Ambulation/Gait assistance: Min guard Ambulation Distance (Feet): 30 Feet   Gait Pattern/deviations: Step-through pattern;Decreased step length -  right;Decreased step length - left Gait velocity: decreased   General Gait Details: slight flexed trunk, cues for posture and position in rw.  Pt declining to ambulate any further.    Stairs            Wheelchair Mobility    Modified Rankin (Stroke Patients Only)       Balance Overall balance assessment: Needs assistance Sitting-balance support: No upper extremity supported Sitting balance-Leahy Scale: Fair     Standing balance support: Bilateral upper extremity supported Standing balance-Leahy Scale: Poor Standing balance comment: using rw for support                    Cognition Arousal/Alertness: Awake/alert Behavior During Therapy: WFL for tasks assessed/performed Overall Cognitive Status: History of cognitive impairments - at baseline                      Exercises      General Comments       Pertinent Vitals/Pain Pain Assessment: No/denies pain    Home Living              Home Equipment: None      Prior Function Level of Independence: Needs assistance       PT Goals (current goals can now be found in the care plan section) Acute Rehab PT Goals Patient Stated Goal: not stated PT Goal Formulation: With patient Time For Goal Achievement: 01/23/16 Potential to Achieve Goals: Good Progress towards PT goals: Progressing toward goals    Frequency  Min 3X/week    PT Plan Discharge plan needs to be updated    Co-evaluation  End of Session Equipment Utilized During Treatment: Gait belt Activity Tolerance: Patient limited by fatigue Patient left: in chair;with call bell/phone within reach;with chair alarm set     Time: 9584-4171 PT Time Calculation (min) (ACUTE ONLY): 20 min  Charges:  $Gait Training: 8-22 mins                    G Codes:      Cassell Clement, PT, CSCS Pager [phone] Office [phone]  01/17/2016, 3:09 PM

## 2016-01-17 NOTE — Evaluation (Signed)
Clinical/Bedside Swallow Evaluation Patient Details  Name: [name] MRN: [id number] Date of Birth: Mar 24, 1956  Today's Date: 01/17/2016 Time: SLP Start Time (ACUTE ONLY): 0806 SLP Stop Time (ACUTE ONLY): 0835 SLP Time Calculation (min) (ACUTE ONLY): 29 min  Past Medical History:  Past Medical History  Diagnosis Date  . Other B-complex deficiencies   . Iron deficiency anemia, unspecified   . Abscess of anal and rectal regions   . Crohn's disease ([place])   . Mental disability   . Heart murmur   . History of blood transfusion ~ 1992    "related to my Crohn's"  . History of stomach ulcers   . Epilepsy ([place])     "petit mal; only at home" (6/9//2015)  . Syncope and collapse 03/31/2014    "while driving"  . Hyperplastic colon polyp   . REGIONAL ENTERITIS, LARGE INTESTINE 10/05/2000    Qualifier: Diagnosis of  By: Jerral Ralph    . Syncope 03/31/2014  . 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 Hemicolectomy  1985    ileocectomy   . Colonoscopy  2013, 2016  . Umbilical hernia repair  ~ 1983  . Tonsillectomy  ~ 1967  . Hernia repair    . Colon surgery      Anastomosis 45cm from anal verge c/w deswecending colon anastomosis  . Pilonidal cyst excision  1980's  . Laparoscopy N/A 12/04/2015    Procedure: LAPAROSCOPY DIAGNOSTIC, LAPAROSCOPIC INTEROLYSIS OF ADHESIONS, HAND- ASSISTED SMALL BOWEL RESECTION, MOBILIZATION OF SPLENIC FLEXURE, LEFT COLECTOMY WITH CREATION OF COLOSTOMY (HARTMAN'S PROCEDURE);  Surgeon: Alphonsa Overall, MD;  Location: [facility];  Service: General;  Laterality: N/A;   HPI:  60 y.o. male with h/o mental disability, heart murmur, Crohn's disease, stomach ulcers, epilepsy, syncope and collapse as well as intestinal fistula, who presented with nausea, emesis and diarrhea for past 3 days with episode of syncope after standing up. Fall was  witnessed and pt hit head with laceration about L eye needing stitches. CT Head 3/25 mild soft tissue swelling/hematoma overlying L frontal bone. No evidence of calvarial fracture, acute intracranial abnormality or traumatic injury to cervical spine. Previous BSE 12/14/2015 recommended NPO status or thin liquids (with known risks) during last admission (SLP suspected esophageal dysphagia). MD discussed PEG wit pt, however pt accepted aspiration with PO intake.   Assessment / Plan / Recommendation Clinical Impression  Pt exhibited multiple swallows and immediate throat clearing/coughing as well as suspected delayed swallow and decreased hyolaryngeal excursion during intake of thin liquids via cup, indicative of possible penetration/aspiration. During intake of puree and nectar thick liquids, delayed coughing observed 1x out of 8 trials. Per last SLP note in February, suspect pt is chronic aspirator with suspected esophageal dysphagia and therefore at high aspiration risk. Pt has not had an objective swallow assessment. Educated re: instrumental study next date and diet recommendation of Dysphagia 2 (fine chop) and nectar thick liquids with meds crushed in puree. SLP will perform MBS next date to r/o penetration/aspiration and recommend least restrictive diet.    Aspiration Risk  Severe aspiration risk    Diet Recommendation Dysphagia 2 (Fine chop);Nectar-thick liquid   Liquid Administration via: Cup;No straw Medication Administration: Crushed with puree Supervision: Patient able to self feed;Staff to assist with self feeding;Full supervision/cueing for compensatory strategies Compensations: Minimize environmental distractions;Small sips/bites;Slow rate;Clear throat intermittently;Hard cough after swallow Postural Changes: Seated  upright at 90 degrees    Other  Recommendations Oral Care Recommendations: Oral care BID Other Recommendations: Order thickener from pharmacy;Prohibited food (jello, ice  cream, thin soups);Remove water pitcher   Follow up Recommendations   (TBD)    Frequency and Duration            Prognosis Prognosis for Safe Diet Advancement: Fair Barriers to Reach Goals: Cognitive deficits;Severity of deficits      Swallow Study   General HPI: 60 y.o. male with h/o mental disability, heart murmur, Crohn's disease, stomach ulcers, epilepsy, syncope and collapse as well as intestinal fistula, who presented with nausea, emesis and diarrhea for past 3 days with episode of syncope after standing up. Fall was witnessed and pt hit head with laceration about L eye needing stitches. CT Head 3/25 mild soft tissue swelling/hematoma overlying L frontal bone. No evidence of calvarial fracture, acute intracranial abnormality or traumatic injury to cervical spine. Previous BSE 12/14/2015 recommended NPO status or thin liquids (with known risks) during last admission (SLP suspected esophageal dysphagia). MD discussed PEG wit pt, however pt accepted aspiration with PO intake. Type of Study: Bedside Swallow Evaluation Previous Swallow Assessment: see HPI Diet Prior to this Study: Thin liquids;Dysphagia 2 (chopped) Temperature Spikes Noted: No Respiratory Status: Room air History of Recent Intubation: No Behavior/Cognition: Cooperative;Alert;Pleasant mood;Requires cueing Oral Cavity Assessment: Within Functional Limits Oral Care Completed by SLP: No Oral Cavity - Dentition: Adequate natural dentition Vision: Functional for self-feeding Self-Feeding Abilities: Able to feed self;Needs set up Patient Positioning: Upright in bed Baseline Vocal Quality: Normal Volitional Cough: Strong Volitional Swallow: Able to elicit    Oral/Motor/Sensory Function Overall Oral Motor/Sensory Function: Generalized oral weakness   Ice Chips Ice chips: Not tested   Thin Liquid Thin Liquid: Impaired Presentation: Cup;Self Fed Oral Phase Impairments:  (none) Oral Phase Functional Implications:   (none) Pharyngeal  Phase Impairments: Suspected delayed Swallow;Decreased hyoid-laryngeal movement;Multiple swallows;Throat Clearing - Immediate;Cough - Immediate    Nectar Thick Nectar Thick Liquid: Impaired Presentation: Cup;Self Fed Oral Phase Impairments:  (none) Oral phase functional implications:  (none) Pharyngeal Phase Impairments: Cough - Delayed   Honey Thick Honey Thick Liquid: Not tested   Puree Puree: Impaired Presentation: Self Fed;Spoon Oral Phase Impairments:  (none) Oral Phase Functional Implications:  (none) Pharyngeal Phase Impairments: Cough - Delayed   Solid   GO   Solid: Not tested        Titus Mould 01/17/2016,9:30 AM  Titus Mould, Student-SLP

## 2016-01-17 NOTE — Consult Note (Addendum)
Patient known to this [place] nurse from previous admissions, he is a high functioning autistic patient. Was living in assisted living prior to his surgery, with transition to SNF after.  He is not able to care for ostomy independently.   WOC wound consult note Reason for Consult: midline wound Wound type: surgical  Measurement: 7.5cm x 2.0cm x 1.5cm  Wound bed:100% pink, moist, 100% granulation tissue Drainage (amount, consistency, odor) minimal, serosanguinous  Periwound: intact, some concern for fungal overgrowth, will treat skin peristomal with next pouch change.  Dressing procedure/placement/frequency: Normal saline dressing daily for moist wound healing.   WOC ostomy consult note Stoma type/location: RUQ, end colostomy Stomal assessment/size: oval shaped, visualized through pouch.  Patient known to this [place] nurse from his previous admission Peristomal assessment: not complted today Treatment options for stomal/peristomal skin: NA Output liquid green, high volumes per staff.  Ostomy pouching: 1pc.urinary pouch in place in order to be able to hook to BSB bag  discussed with bedside nurse.  If stool thickens will need to transition back to fecal pouch.   WOC will follow along with you for support with ostomy care and wound care as needed [place], [place]

## 2016-01-17 NOTE — Care Management Obs Status (Addendum)
[place] NOTIFICATION   Patient Details  Name: [name] MRN: [id number] Date of Birth: [birth year]   Medicare Observation Status Notification Given:  Yes MOON given to pt's POA, [name] via phone, original left in pt's room, copy to [place] for scanning into medical records   Erenest Rasher, RN 01/17/2016, 2:16 PM

## 2016-01-18 ENCOUNTER — Observation Stay (HOSPITAL_COMMUNITY): Payer: Medicare Other

## 2016-01-18 ENCOUNTER — Observation Stay (HOSPITAL_BASED_OUTPATIENT_CLINIC_OR_DEPARTMENT_OTHER): Payer: Medicare Other

## 2016-01-18 DIAGNOSIS — K529 Noninfective gastroenteritis and colitis, unspecified: Secondary | ICD-10-CM | POA: Diagnosis not present

## 2016-01-18 DIAGNOSIS — K50019 Crohn's disease of small intestine with unspecified complications: Secondary | ICD-10-CM | POA: Diagnosis not present

## 2016-01-18 DIAGNOSIS — R55 Syncope and collapse: Secondary | ICD-10-CM | POA: Diagnosis not present

## 2016-01-18 DIAGNOSIS — N179 Acute kidney failure, unspecified: Secondary | ICD-10-CM | POA: Diagnosis not present

## 2016-01-18 LAB — ECHOCARDIOGRAM COMPLETE
HEIGHTINCHES: 70 in
WEIGHTICAEL: 3089.97 [oz_av]

## 2016-01-18 MED ORDER — OXYCODONE HCL 5 MG PO TABS: 5.0000 mg | ORAL_TABLET | Freq: Four times a day (QID) | ORAL | Status: DC | PRN

## 2016-01-18 MED ORDER — PRIMIDONE 250 MG PO TABS
500.0000 mg | ORAL_TABLET | Freq: Two times a day (BID) | ORAL | Status: DC
Start: 2016-01-18 — End: 2016-11-24

## 2016-01-18 NOTE — Progress Notes (Signed)
CSW contacted [place] who confirmed patient is from [facility]. Per [name], patient is to return back to this facility upon discharge. CSW informed [name] of PT recommendation for SNF placement. However, [name] informed CSW that she has already spoke to the facility regarding the level of care the patient will need. Per [name], facility is willing to take him back.   CSW contacted facility to confirm. Per Tammy, patient can return to facility on today. FL2 and Discharge Summary sent to facility. CSW to arrange transportation for the patient to arrive to [facility] on today. CSW will arrange transport via [facility]. No further needs requested at this time. CSW to sign off.   Please consult if further CSW needs arise.   Lucius Conn, [place] Worker [facility] Ph: [phone]

## 2016-01-18 NOTE — Progress Notes (Signed)
   01/17/16 0751  SLP G-Codes **NOT FOR INPATIENT CLASS**  Functional Assessment Tool Used clinical judgement  Functional Limitations Swallowing  Swallow Current Status (A3353) CJ  Swallow Goal Status (R1740) CJ  SLP Evaluations  $ SLP Speech Visit 1 Procedure  SLP Evaluations  $BSS Swallow 1 Procedure

## 2016-01-18 NOTE — Progress Notes (Signed)
  Echocardiogram 2D Echocardiogram has been performed.  Jennette Dubin 01/18/2016, 9:38 AM

## 2016-01-18 NOTE — Progress Notes (Addendum)
Received call from pt's sister, POA, [name], States pt was recently approved for [facility] Medicaid ID # [id number]. Information called to [facility] Admission office. Will update info on pt's chart. He has [employer] Medicare and Medicaid.  Jonnie Finner RN CCM Case Mgmt phone [phone]

## 2016-01-18 NOTE — NC FL2 (Signed)
[place] LEVEL OF CARE SCREENING TOOL     IDENTIFICATION  Patient Name: [name] Birthdate: [birth year] Sex: male Admission Date (Current Location): 01/15/2016  [facility] and [place] Number:  Herbalist and Address:  [facility], [place] [address], [place], [place] [address]      Provider Number: [id number]  Attending Physician Name and Address:  Kinnie Feil, MD  Relative Name and Phone Number:       Current Level of Care: Hospital Recommended Level of Care: [place] Prior Approval Number:    Date Approved/Denied:   PASRR Number:    Discharge Plan: Other (Comment) [facility])    Current Diagnoses: Patient Active Problem List   Diagnosis Date Noted  . Syncope 01/16/2016  . Gastroenteritis 01/16/2016  . Dehydration 01/16/2016  . Crohn's disease of both small and large intestine with fistula ([place])   . SBO (small bowel obstruction) ([place]) 12/01/2015  . S/P partial colectomy for Crohn's disease 12/01/2015  . ARF (acute renal failure) ([place]) 12/01/2015  . Shock ([place]) 12/01/2015  . SIRS (systemic inflammatory response syndrome) ([place]) 12/01/2015  . Localization-related idiopathic epilepsy and epileptic syndromes with seizures of localized onset, not intractable, without status epilepticus ([place]) 03/30/2015  . Aortic valve stenosis, mild 03/31/2014  . Aortic valve regurgitation 03/31/2014  . Pseudopolyposis of colon ([place]) 01/03/2012  . Crohn's disease ([place]) 12/19/2011  . Personal history of immunosupression therapy 12/19/2011  . Seizure disorder ([place]) 12/19/2011  . ECZEMA 08/21/2008  . CHOLELITHIASIS, ASYMPTOMATIC 01/29/2008  . VITAMIN B12 DEFICIENCY 01/17/2008    Orientation RESPIRATION BLADDER Height & Weight     Self, Time, Situation, Place  Normal Incontinent Weight: 193 lb 2 oz (87.6 kg) (bedscale) Height:  5' 10"  (177.8 cm)  BEHAVIORAL SYMPTOMS/MOOD NEUROLOGICAL BOWEL  NUTRITION STATUS      Colostomy Diet  AMBULATORY STATUS COMMUNICATION OF NEEDS Skin   Limited Assist Verbally Surgical wounds                       Personal Care Assistance Level of Assistance  Bathing, Feeding, Dressing Bathing Assistance: Limited assistance Feeding assistance: Independent Dressing Assistance: Limited assistance     Functional Limitations Info  Sight, Hearing, Speech Sight Info: Adequate Hearing Info: Impaired Speech Info: Adequate    SPECIAL CARE FACTORS FREQUENCY  PT (By licensed PT), OT (By licensed OT)                    Contractures      Additional Factors Info  Code Status Code Status Info: FULL             Current Medications (01/18/2016):  This is the current hospital active medication list Current Facility-Administered Medications  Medication Dose Route Frequency Provider Last Rate Last Dose  . 0.9 %  sodium chloride infusion   Intravenous Continuous Kinnie Feil, MD 75 mL/hr at 01/17/16 [id number]    . acetaminophen (TYLENOL) tablet 650 mg  650 mg Oral Q6H PRN Toy Baker, MD       Or  . acetaminophen (TYLENOL) suppository 650 mg  650 mg Rectal Q6H PRN Toy Baker, MD      . enoxaparin (LOVENOX) injection 40 mg  40 mg Subcutaneous Q24H Toy Baker, MD   40 mg at 01/18/16 0933  . feeding supplement (BOOST / RESOURCE BREEZE) liquid 1 Container  1 Container Oral TID WC Delfina Redwood, MD   1  Container at 01/18/16 0700  . folic acid (FOLVITE) tablet 1 mg  1 mg Oral Daily Delfina Redwood, MD   1 mg at 01/18/16 0933  . HYDROcodone-acetaminophen (NORCO/VICODIN) 5-325 MG per tablet 1-2 tablet  1-2 tablet Oral Q4H PRN Toy Baker, MD      . levETIRAcetam (KEPPRA) tablet 500 mg  500 mg Oral BID Toy Baker, MD   500 mg at 01/18/16 0932  . magnesium oxide (MAG-OX) tablet 200 mg  200 mg Oral BID Kinnie Feil, MD   200 mg at 01/18/16 0932  . ondansetron (ZOFRAN) tablet 4 mg  4 mg Oral Q6H PRN  Toy Baker, MD       Or  . ondansetron (ZOFRAN) injection 4 mg  4 mg Intravenous Q6H PRN Toy Baker, MD      . primidone (MYSOLINE) tablet 500 mg  500 mg Oral BID Toy Baker, MD   500 mg at 01/18/16 0932  . sodium chloride flush (NS) 0.9 % injection 3 mL  3 mL Intravenous Q12H Toy Baker, MD   3 mL at 01/18/16 0024     Discharge Medications: Please see discharge summary for a list of discharge medications.  Relevant Imaging Results:  Relevant Lab Results:   Additional Information SSN [id number].   Raymondo Band, LCSW

## 2016-01-18 NOTE — Discharge Summary (Addendum)
Physician Discharge Summary  [name] PFX:[id number] DOB: [birth year] DOA: 01/15/2016  PCP: No PCP Per Patient  Admit date: 01/15/2016 Discharge date: 01/18/2016  Time spent: >35 minutes  Recommendations for Outpatient Follow-up:  PCP in 3-7 days as needed  Discharge Diagnoses:  Active Problems:   Crohn's disease ([place])   Seizure disorder ([place])   Localization-related idiopathic epilepsy and epileptic syndromes with seizures of localized onset, not intractable, without status epilepticus ([place])   ARF (acute renal failure) ([facility])   SIRS (systemic inflammatory response syndrome) ([facility])   Crohn's disease of both small and large intestine with fistula ([place])   Syncope   Gastroenteritis   Dehydration   Discharge Condition: stable   Diet recommendation: regular   Filed Weights   01/15/16 1958 01/16/16 0417 01/18/16 0723  Weight: 95.255 kg (210 lb) 86.637 kg (191 lb) 87.6 kg (193 lb 2 oz)    History of present illness:  60 y/o male with PMH of Mental Disability, Seizures, Crohn's Disease s/p (h/o left colonic fistula, small bowel resection as well as left hemicolectomy with Hartmann pouch placement in 12/04/2015) presented with syncope. Admitted with dehydration, syncope, AKI. Found to have viral gastroenteritis  -"Presented with nausea vomiting diarrhea for the past 3 days and today episode of syncope while patient was trying to put on his pants and stood up from sitting position. This was a witnessed fall patient fell down hitting his head with laceration above left eye needing stitches. He has been feeling lightheaded when he stands up when EMS arrived blood pressure 92/56 heart rate 110 satting 97% on room air CBG 174. Patient does not endorse any chest pain or shortness of breath no fever"   Hospital Course:   Syncope. Probable due to dehydration (due to viral gastroenteritis). Pt was dehydrated, mild hypotensive on admission. Neuro exam is non focal. CT head: No evidence of acute  intracranial abnormality -Patient improved with supportive care and IV hydration. No new symptoms   AKI dehydration on admission. Cr 2.1. renal function has improved with IVF.    Crohn's Disease s/p (h/o left colonic fistula, small bowel resection as well as left hemicolectomy with Hartmann pouch placement in 12/04/2015) -Pt found to have viral gastroenteritis. (norovirus). He improved with supportive care, IVF. no further vomiting or abdominal pains   Procedures:  none (i.e. Studies not automatically included, echos, thoracentesis, etc; not x-rays)  Consultations:  none  Discharge Exam: Filed Vitals:   01/18/16 0013 01/18/16 0723  BP: 100/47 107/47  Pulse: 78 76  Temp: 98.4 F (36.9 C) 97.5 F (36.4 C)  Resp: 16 18    General: alert, no distress  Cardiovascular: s1,s2 rrr Respiratory: CTA BL   Discharge Instructions  Discharge Instructions    Diet - low sodium heart healthy    Complete by:  As directed      Discharge instructions    Complete by:  As directed   Please follow up with primary care doctor in 3-7 days as needed     Increase activity slowly    Complete by:  As directed             Medication List    STOP taking these medications        oxyCODONE 5 MG immediate release tablet  Commonly known as:  Oxy IR/ROXICODONE     sulfaSALAzine 500 MG tablet  Commonly known as:  AZULFIDINE      TAKE these medications        acetaminophen 325  MG tablet  Commonly known as:  TYLENOL  Take 2 tablets (650 mg total) by mouth every 6 (six) hours as needed for mild pain (or Fever >/= 101).     NUTRA SHAKE PO  Take 1 Container by mouth 2 (two) times daily.     feeding supplement (ENSURE ENLIVE) Liqd  Take 237 mLs by mouth 3 (three) times daily between meals.     folic acid 1 MG tablet  Commonly known as:  FOLVITE  Take 1 mg by mouth daily.     levETIRAcetam 500 MG tablet  Commonly known as:  KEPPRA  Take 1 tablet (500 mg total) by mouth 2 (two) times  daily.     magic mouthwash Soln  Take 15 mLs by mouth 4 (four) times daily as needed for mouth pain (sore throat).     ondansetron 4 MG tablet  Commonly known as:  ZOFRAN  Take 1 tablet (4 mg total) by mouth every 6 (six) hours as needed for nausea.     primidone 250 MG tablet  Commonly known as:  MYSOLINE  Take 2 tablets (500 mg total) by mouth 2 (two) times daily.       No Known Allergies    The results of significant diagnostics from this hospitalization (including imaging, microbiology, ancillary and laboratory) are listed below for reference.    Significant Diagnostic Studies: Dg Chest 2 View  01/17/2016  CLINICAL DATA:  Patient with cough. EXAM: CHEST  2 VIEW COMPARISON:  Chest radiograph 12/07/2015. FINDINGS: Stable enlarged cardiac and mediastinal contours. Minimal left basilar airspace opacity. No pleural effusion or pneumothorax. Thoracic spine degenerative changes. IMPRESSION: Minimal left basilar atelectasis.  No acute cardiopulmonary process. Electronically Signed   By: Lovey Newcomer M.D.   On: 01/17/2016 11:13   Ct Head Wo Contrast  01/15/2016  CLINICAL DATA:  Syncope, fall, small laceration over left eye EXAM: CT HEAD WITHOUT CONTRAST CT CERVICAL SPINE WITHOUT CONTRAST TECHNIQUE: Multidetector CT imaging of the head and cervical spine was performed following the standard protocol without intravenous contrast. Multiplanar CT image reconstructions of the cervical spine were also generated. COMPARISON:  CT head/ cervical spine dated 04/10/2014 FINDINGS: CT HEAD FINDINGS No evidence of parenchymal hemorrhage or extra-axial fluid collection. No mass lesion, mass effect, or midline shift. No CT evidence of acute infarction. Mild subcortical white matter and periventricular small vessel ischemic changes. Mild cortical atrophy.  No ventriculomegaly. The visualized paranasal sinuses are essentially clear. Minimal partial opacification the left mastoid air cells (series 4/image 9). Mild  soft tissue swelling/hematoma overlying the left frontal bone (series 3/image 20). No evidence of calvarial fracture. CT CERVICAL SPINE FINDINGS Normal cervical lordosis. No evidence of fracture or dislocation. Vertebra body heights are maintained. Dens appears intact. No prevertebral soft tissue swelling. Moderate degenerative changes with stable bulky anterior osteophytosis. Visualized thyroid is notable for stable multinodular goiter, including a dominant 1.8 cm right thyroid nodule (series 4/image 92), unchanged and previously biopsied. Visualized lung apices are clear. IMPRESSION: Mild soft tissue swelling/hematoma overlying the left frontal bone. No evidence of calvarial fracture. No evidence of acute intracranial abnormality. Mild atrophy with small vessel ischemic changes. No evidence of traumatic injury to the cervical spine. Moderate degenerative changes with bulky anterior osteophytosis. Electronically Signed   By: Julian Hy M.D.   On: 01/15/2016 21:50   Ct Cervical Spine Wo Contrast  01/15/2016  CLINICAL DATA:  Syncope, fall, small laceration over left eye EXAM: CT HEAD WITHOUT CONTRAST CT CERVICAL SPINE WITHOUT  CONTRAST TECHNIQUE: Multidetector CT imaging of the head and cervical spine was performed following the standard protocol without intravenous contrast. Multiplanar CT image reconstructions of the cervical spine were also generated. COMPARISON:  CT head/ cervical spine dated 04/10/2014 FINDINGS: CT HEAD FINDINGS No evidence of parenchymal hemorrhage or extra-axial fluid collection. No mass lesion, mass effect, or midline shift. No CT evidence of acute infarction. Mild subcortical white matter and periventricular small vessel ischemic changes. Mild cortical atrophy.  No ventriculomegaly. The visualized paranasal sinuses are essentially clear. Minimal partial opacification the left mastoid air cells (series 4/image 9). Mild soft tissue swelling/hematoma overlying the left frontal bone  (series 3/image 20). No evidence of calvarial fracture. CT CERVICAL SPINE FINDINGS Normal cervical lordosis. No evidence of fracture or dislocation. Vertebra body heights are maintained. Dens appears intact. No prevertebral soft tissue swelling. Moderate degenerative changes with stable bulky anterior osteophytosis. Visualized thyroid is notable for stable multinodular goiter, including a dominant 1.8 cm right thyroid nodule (series 4/image 92), unchanged and previously biopsied. Visualized lung apices are clear. IMPRESSION: Mild soft tissue swelling/hematoma overlying the left frontal bone. No evidence of calvarial fracture. No evidence of acute intracranial abnormality. Mild atrophy with small vessel ischemic changes. No evidence of traumatic injury to the cervical spine. Moderate degenerative changes with bulky anterior osteophytosis. Electronically Signed   By: Julian Hy M.D.   On: 01/15/2016 21:50    Microbiology: Recent Results (from the past 240 hour(s))  Gastrointestinal Panel by PCR , Stool     Status: Abnormal   Collection Time: 01/16/16  2:51 AM  Result Value Ref Range Status   Campylobacter species NOT DETECTED NOT DETECTED Final   Plesimonas shigelloides NOT DETECTED NOT DETECTED Final   Salmonella species NOT DETECTED NOT DETECTED Final   Yersinia enterocolitica NOT DETECTED NOT DETECTED Final   Vibrio species NOT DETECTED NOT DETECTED Final   Vibrio cholerae NOT DETECTED NOT DETECTED Final   Enteroaggregative E coli (EAEC) NOT DETECTED NOT DETECTED Final   Enteropathogenic E coli (EPEC) NOT DETECTED NOT DETECTED Final   Enterotoxigenic E coli (ETEC) NOT DETECTED NOT DETECTED Final   Shiga like toxin producing E coli (STEC) NOT DETECTED NOT DETECTED Final   E. coli O157 NOT DETECTED NOT DETECTED Final   Shigella/Enteroinvasive E coli (EIEC) NOT DETECTED NOT DETECTED Final   Cryptosporidium NOT DETECTED NOT DETECTED Final   Cyclospora cayetanensis NOT DETECTED NOT DETECTED  Final   Entamoeba histolytica NOT DETECTED NOT DETECTED Final   Giardia lamblia NOT DETECTED NOT DETECTED Final   Adenovirus F40/41 NOT DETECTED NOT DETECTED Final   Astrovirus NOT DETECTED NOT DETECTED Final   Norovirus GI/GII DETECTED (A) NOT DETECTED Final    Comment: CRITICAL RESULT CALLED TO, READ BACK BY AND VERIFIED WITH: CHRISTINE VILLANUEVA,RN 01/16/2016 1702 BY J RAZZAKSUAREZ,MT    Rotavirus A NOT DETECTED NOT DETECTED Final   Sapovirus (I, II, IV, and V) NOT DETECTED NOT DETECTED Final  C difficile quick scan w PCR reflex     Status: None   Collection Time: 01/16/16  2:51 AM  Result Value Ref Range Status   C Diff antigen NEGATIVE NEGATIVE Final   C Diff toxin NEGATIVE NEGATIVE Final   C Diff interpretation Negative for toxigenic C. difficile  Final     Labs: Basic Metabolic Panel:  Recent Labs Lab 01/15/16 2020 01/15/16 2324 01/16/16 0615 01/17/16 0440  NA 136 139 141 139  K 6.8* 5.2* 4.7 4.5  CL 106 113* 112* 115*  CO2  20*  --  21* 17*  GLUCOSE 119* 83 91 94  BUN 16 18 15 14   CREATININE 2.14* 1.60* 1.60* 1.21  CALCIUM 8.5*  --  8.1* 8.5*  MG  --   --  1.4*  --   PHOS  --   --  2.9  --    Liver Function Tests:  Recent Labs Lab 01/15/16 2020 01/16/16 0615  AST 46* 33  ALT 16* 16*  ALKPHOS 93 85  BILITOT 1.3* 0.5  PROT 7.0 6.5  ALBUMIN 2.8* 2.5*    Recent Labs Lab 01/15/16 2020  LIPASE 49   No results for input(s): AMMONIA in the last 168 hours. CBC:  Recent Labs Lab 01/15/16 2020 01/15/16 2324 01/16/16 0615  WBC 11.7*  --  7.3  NEUTROABS 10.6*  --   --   HGB 13.5 13.6 11.5*  HCT 40.4 40.0 34.7*  MCV 101.8*  --  101.2*  PLT 245  --  240   Cardiac Enzymes:  Recent Labs Lab 01/16/16 0615  TROPONINI 0.03   BNP: BNP (last 3 results) No results for input(s): BNP in the last 8760 hours.  ProBNP (last 3 results) No results for input(s): PROBNP in the last 8760 hours.  CBG: No results for input(s): GLUCAP in the last 168  hours.     SignedKinnie Feil  [facility] 01/18/2016, 8:23 AM

## 2016-01-18 NOTE — Progress Notes (Signed)
MBSS complete. Full report located under chart review in imaging section. Daequan Kozma, MA CCC-SLP [phone]  

## 2016-01-27 ENCOUNTER — Encounter: Payer: Self-pay | Admitting: *Deleted

## 2016-01-30 ENCOUNTER — Other Ambulatory Visit: Payer: Self-pay | Admitting: Internal Medicine

## 2016-02-07 ENCOUNTER — Other Ambulatory Visit (INDEPENDENT_AMBULATORY_CARE_PROVIDER_SITE_OTHER): Payer: Medicare Other

## 2016-02-07 ENCOUNTER — Encounter: Payer: Self-pay | Admitting: Internal Medicine

## 2016-02-07 ENCOUNTER — Ambulatory Visit (INDEPENDENT_AMBULATORY_CARE_PROVIDER_SITE_OTHER): Payer: Medicare Other | Admitting: Internal Medicine

## 2016-02-07 VITALS — BP 98/56 | HR 69 | Ht 70.0 in | Wt 188.0 lb

## 2016-02-07 DIAGNOSIS — K50813 Crohn's disease of both small and large intestine with fistula: Secondary | ICD-10-CM

## 2016-02-07 DIAGNOSIS — E538 Deficiency of other specified B group vitamins: Secondary | ICD-10-CM | POA: Diagnosis not present

## 2016-02-07 LAB — COMPREHENSIVE METABOLIC PANEL
ALBUMIN: 3.3 g/dL — AB (ref 3.5–5.2)
ALK PHOS: 111 U/L (ref 39–117)
ALT: 9 U/L (ref 0–53)
AST: 12 U/L (ref 0–37)
BILIRUBIN TOTAL: 0.3 mg/dL (ref 0.2–1.2)
BUN: 11 mg/dL (ref 6–23)
CO2: 27 mEq/L (ref 19–32)
CREATININE: 0.95 mg/dL (ref 0.40–1.50)
Calcium: 9.3 mg/dL (ref 8.4–10.5)
Chloride: 105 mEq/L (ref 96–112)
GFR: 85.46 mL/min (ref >60.00)
GLUCOSE: 83 mg/dL (ref 70–99)
Potassium: 5 mEq/L (ref 3.5–5.1)
SODIUM: 139 meq/L (ref 135–145)
TOTAL PROTEIN: 7.5 g/dL (ref 6.0–8.3)

## 2016-02-07 LAB — CBC WITH DIFFERENTIAL/PLATELET
BASOS ABS: 0 10*3/uL (ref 0.0–0.1)
Basophils Relative: 0.3 % (ref 0.0–3.0)
EOS ABS: 0 10*3/uL (ref 0.0–0.7)
EOS PCT: 0 % (ref 0.0–5.0)
HCT: 35.1 % — ABNORMAL LOW (ref 39.0–52.0)
HEMOGLOBIN: 11.8 g/dL — AB (ref 13.0–17.0)
LYMPHS ABS: 0.7 10*3/uL (ref 0.7–4.0)
Lymphocytes Relative: 13.2 % (ref 12.0–46.0)
MCHC: 33.7 g/dL (ref 30.0–36.0)
MCV: 96.2 fl (ref 78.0–100.0)
MONO ABS: 0.6 10*3/uL (ref 0.1–1.0)
Monocytes Relative: 11.4 % (ref 3.0–12.0)
NEUTROS PCT: 75.1 % (ref 43.0–77.0)
Neutro Abs: 3.9 10*3/uL (ref 1.4–7.7)
Platelets: 354 10*3/uL (ref 150.0–400.0)
RBC: 3.65 Mil/uL — AB (ref 4.22–5.81)
RDW: 14.3 % (ref 11.5–15.5)
WBC: 5.2 10*3/uL (ref 4.0–10.5)

## 2016-02-07 LAB — HEPATITIS B SURFACE ANTIGEN: HEP B S AG: NEGATIVE

## 2016-02-07 LAB — VITAMIN B12: VITAMIN B 12: 229 pg/mL (ref 211–911)

## 2016-02-07 LAB — HEPATITIS C ANTIBODY: HCV AB: NEGATIVE

## 2016-02-07 LAB — HEPATITIS B SURFACE ANTIBODY,QUALITATIVE: Hep B S Ab: NEGATIVE

## 2016-02-07 LAB — HEPATITIS B CORE ANTIBODY, TOTAL: HEP B C TOTAL AB: NONREACTIVE

## 2016-02-07 NOTE — Patient Instructions (Addendum)
Please discontinue mercaptopurine and folvite.  Your physician has requested that you go to the basement for the following lab work before leaving today: TB Gold, hepatitis B and C serologies, CBC, CMP  Once released by Dr Lucia Gaskins, we may start you on Humira.  CC:Dr [employer]

## 2016-02-07 NOTE — Addendum Note (Signed)
Addended by: Larina Bras on: 02/07/2016 01:56 PM   Modules accepted: Orders

## 2016-02-07 NOTE — Progress Notes (Signed)
Subjective:    Patient ID: [name], male    DOB: 1956-06-02, 60 y.o.   MRN: [id number]  HPI [name] is a 60 yo male with PMH of Ileocolonic Crohn's disease diagnosed in 1985, status post right hemicolectomy with known Fistulizing and stricturing disease who is here for follow-up. He is here today with his aunt and her husband. He was recently hospitalized in February with small bowel obstruction. He underwent surgery with Dr. Lucia Gaskins after CT scan on 12/01/2015 showed high-grade small bowel obstruction. On 12/04/2015 he underwent laparoscopic lysis of adhesions, laparoscopic small bowel resection, left hemicolectomy with left transverse colon end colostomy and Hartmann pouch. He was found to have a small bowel obstruction secondary to a small bowel to left colon fistula from Crohn's disease. Pathology was benign. recovery has been rather uneventful though he has had a wound which is healing by secondary intention in the midline. He follows with Dr. Lucia Gaskins for this. His colostomy has been working well without bleeding or pain. He has passed nothing per rectum since surgery. He does wish for re-anastomosis. He has been off of 6-MP and sulfasalazine. He denies abdominal pain. Denies nausea or vomiting. Denies blood or melena per ostomy. His next follow-up is with Dr. Lucia Gaskins this Friday.  Review of Systems As per HPI, otherwise negative  Current Medications, Allergies, Past Medical History, Past Surgical History, Family History and Social History were reviewed in [employer] record.     Objective:   Physical Exam BP 98/56 mmHg  Pulse 69  Ht 5' 10"  (1.778 m)  Wt 188 lb (85.276 kg)  BMI 26.98 kg/m2 Constitutional: Well-developed and well-nourished. No distress. HEENT: Normocephalic and atraumatic. Oropharynx is clear and moist. No oropharyngeal exudate. Conjunctivae are normal.  No scleral icterus. Neck: Neck supple. Trachea midline. Cardiovascular: Normal  rate, regular rhythm and intact distal pulses.  Pulmonary/chest: Effort normal and breath sounds normal. No wheezing, rales or rhonchi. Abdominal: Soft, nontender, ostomy left midabdomen draining light brown stool, wound midline packed with gauze without surrounding fluctuance, positive bowel sounds. Extremities: no clubbing, cyanosis, positive for trace pretibial edema Neurological: Alert and oriented to person place and time. Skin: Skin is warm and dry. No rashes noted. Psychiatric: Normal mood and affect. Behavior is normal.  CBC    Component Value Date/Time   WBC 7.3 01/16/2016 0615   RBC 3.43* 01/16/2016 0615   HGB 11.5* 01/16/2016 0615   HCT 34.7* 01/16/2016 0615   PLT 240 01/16/2016 0615   MCV 101.2* 01/16/2016 0615   MCH 33.5 01/16/2016 0615   MCHC 33.1 01/16/2016 0615   RDW 13.4 01/16/2016 0615   LYMPHSABS 0.3* 01/15/2016 2020   MONOABS 0.9 01/15/2016 2020   EOSABS 0.0 01/15/2016 2020   BASOSABS 0.0 01/15/2016 2020    CMP     Component Value Date/Time   NA 139 01/17/2016 0440   K 4.5 01/17/2016 0440   CL 115* 01/17/2016 0440   CO2 17* 01/17/2016 0440   GLUCOSE 94 01/17/2016 0440   BUN 14 01/17/2016 0440   CREATININE 1.21 01/17/2016 0440   CREATININE 0.87 10/28/2014 1533   CALCIUM 8.5* 01/17/2016 0440   PROT 6.5 01/16/2016 0615   ALBUMIN 2.5* 01/16/2016 0615   AST 33 01/16/2016 0615   ALT 16* 01/16/2016 0615   ALKPHOS 85 01/16/2016 0615   BILITOT 0.5 01/16/2016 0615   GFRNONAA >60 01/17/2016 0440   GFRAA >60 01/17/2016 0440        Assessment & Plan:  60 yo male with PMH of Ileocolonic Crohn's disease diagnosed in 1985, status post right hemicolectomy with known Fistulizing and stricturing disease who is here for follow-up.   1. Crohn's ileocolitis with fistula causing SBO -- He has had a complication of long-standing Crohn's disease despite long-term 6-MP and sulfasalazine. We had a very long discussion today regarding management of Crohn's disease,  specifically with biologic therapy. I feel that he is very appropriate for biologic therapy given his Crohn's disease and fistula formation. We discussed how patients with Fistulizing Crohn's disease do better on biologic been not. I do not think sulfasalazine has any role in his IBD going forward. We discussed Remicade, Humira, Entyvio, and Stellara. He is biologic nave. I feel that he is most appropriate for anti-TNF therapy, probably with Humira over Remicade due to administration ease. Will need clearance from Dr. Lucia Gaskins before starting biologic therapy. -- We also discussed the risks in great detail including the risk of infection (including reactivation of latent TB and underlying viral hepatitis), hepatotoxicity, rash, leukopenia, pancreatitis, nausea, malignancy (specifically lymphoma), demyelinating disease, and even heart failure. --Check CBC, CMP, hepatitis B and C serology and quantiferon gold today --Await follow-up with Dr. Lucia Gaskins on timing of biologic initiation --Stop folate as this was given in the setting of sulfasalazine which we are not restarting  2. B12 deficiency -- history of needs IM B12 once monthly  Follow-up in 3 months, sooner if necessary   45 minutes spent with the patient today. Greater than 50% was spent in counseling and coordination of care with the patient

## 2016-02-08 ENCOUNTER — Telehealth: Payer: Self-pay | Admitting: *Deleted

## 2016-02-08 MED ORDER — CYANOCOBALAMIN 1000 MCG/ML IJ SOLN: 1000.0000 ug | INTRAMUSCULAR | Status: DC

## 2016-02-08 NOTE — Telephone Encounter (Signed)
I have spoken to patient's POA, Cathy regarding b12 results. She states patient lives at [facility]. I have spoken to [facility] at [facility] and they will give injections monthly with a faxed order. Orders faxed to [phone].

## 2016-02-08 NOTE — Telephone Encounter (Signed)
-----   Message from Jerene Bears, MD sent at 02/07/2016  5:51 PM EDT ----- Needs to resume B12 injections 1000 mcg per month

## 2016-02-09 ENCOUNTER — Other Ambulatory Visit: Payer: Self-pay

## 2016-02-09 DIAGNOSIS — K50919 Crohn's disease, unspecified, with unspecified complications: Secondary | ICD-10-CM

## 2016-02-09 LAB — QUANTIFERON TB GOLD ASSAY (BLOOD)
Interferon Gamma Release Assay: NEGATIVE
MITOGEN-NIL SO: 0.96 [IU]/mL
Quantiferon Nil Value: 0.09 IU/mL
Quantiferon Tb Ag Minus Nil Value: 0 IU/mL

## 2016-02-22 ENCOUNTER — Telehealth: Payer: Self-pay | Admitting: Neurology

## 2016-02-22 NOTE — Telephone Encounter (Signed)
PT's care giver [name] called in regards to his medication and she thinks he needs to be seen/Dawn CB#[phone] or [phone]

## 2016-02-23 NOTE — Telephone Encounter (Signed)
[name] returned my call. States that patient recently had surgery for a blockage, patient was losing weight and the blockage was discovered. He now has a colostomy bag. States that he is now gaining weight, but states since after the surgery he has bouts of dizziness when getting up, states he did actually pass out once while standing up. I did tell her that I didn't think it was his Keppra as he's been on the same dose with no changes for quite a while. I did advise her to talk with either his surgeon or discuss this with his pcp at this time this doesn't seem like a neurological issue. She verbalized good understanding and states that she will discuss this with patients surgeon first he has an upcoming f/u visit within the next week. I also advised for her to tell patient to make sure he is getting up slowly, to have him sit for a few minutes when getting up from a lying position before standing up.

## 2016-02-23 NOTE — Telephone Encounter (Signed)
Left msg for patient caregiver to give me a call back.

## 2016-02-25 ENCOUNTER — Telehealth: Payer: Self-pay | Admitting: *Deleted

## 2016-02-25 ENCOUNTER — Ambulatory Visit (INDEPENDENT_AMBULATORY_CARE_PROVIDER_SITE_OTHER): Payer: Medicare Other | Admitting: Internal Medicine

## 2016-02-25 DIAGNOSIS — Z23 Encounter for immunization: Secondary | ICD-10-CM

## 2016-02-25 NOTE — Telephone Encounter (Signed)
Dr Vena Rua original note under "labs" on 02/07/16 indicated to give patient twinrix series. However, patient had a positive hepatitis A total antibody on 12/02/15 and negative hepatitis B antibody. I have spoken to Dr Hilarie Fredrickson and he has given verbal order to give Hepatitis B vaccination only instead of Twinrix. I have changed the order in the system and patient is advised he will only be getting the hepatitis B series because he does not need the hepatitis A injection. He verbalizes understanding.

## 2016-03-16 ENCOUNTER — Telehealth: Payer: Self-pay | Admitting: Internal Medicine

## 2016-03-16 NOTE — Telephone Encounter (Signed)
Caregiver was concerned because pt given lasix and potassium. States pt K was low when in hospital. Discussed with her that this is quite normal when lasix is given to order potassium also. Should not cause a problem with his Crohn's.

## 2016-03-23 ENCOUNTER — Encounter: Payer: Self-pay | Admitting: Family Medicine

## 2016-03-28 ENCOUNTER — Ambulatory Visit (INDEPENDENT_AMBULATORY_CARE_PROVIDER_SITE_OTHER): Payer: Medicare Other | Admitting: Internal Medicine

## 2016-03-28 DIAGNOSIS — K50013 Crohn's disease of small intestine with fistula: Secondary | ICD-10-CM

## 2016-03-28 DIAGNOSIS — Z23 Encounter for immunization: Secondary | ICD-10-CM

## 2016-04-18 ENCOUNTER — Other Ambulatory Visit: Payer: Self-pay | Admitting: Neurology

## 2016-04-18 NOTE — Telephone Encounter (Signed)
He continues on Keppra 554m BID and Primidone 503mBID with no further seizures or "petit mals." He would like to stay on same medication regimen, he is happy in his new living situation and not driving. He will follow-up in 1 year or earlier if needed, and knows to call our office for any problems.

## 2016-06-23 ENCOUNTER — Telehealth: Payer: Self-pay | Admitting: Internal Medicine

## 2016-06-23 NOTE — Telephone Encounter (Signed)
Left a message to call back. Per Dr Hilarie Fredrickson the patient will need a colonoscopy per rectum and per ostomy to rule out active IBD prior to ostomy take down by Dr Lucia Gaskins.  Tentatively scheduled the pre-visit for 07/05/16 at 4pm Colonoscopy 07/14/16 at 4pm.

## 2016-06-23 NOTE — Telephone Encounter (Signed)
Spoke with the relative. Appointments moved.  Consent will have to be done over the phone. His guardian lives in [place]. . The patient lives in a group home. The care taker will help the patient with the prep.

## 2016-06-28 ENCOUNTER — Other Ambulatory Visit: Payer: Self-pay | Admitting: General Practice

## 2016-06-28 ENCOUNTER — Other Ambulatory Visit: Payer: Self-pay | Admitting: Surgery

## 2016-06-28 DIAGNOSIS — K50919 Crohn's disease, unspecified, with unspecified complications: Secondary | ICD-10-CM

## 2016-06-28 DIAGNOSIS — L988 Other specified disorders of the skin and subcutaneous tissue: Secondary | ICD-10-CM

## 2016-06-30 ENCOUNTER — Telehealth: Payer: Self-pay | Admitting: Neurology

## 2016-06-30 NOTE — Telephone Encounter (Signed)
As long as he takes it the night prior, and soon after the procedure, taking it twice a day still, that is fine. thanks

## 2016-06-30 NOTE — Telephone Encounter (Signed)
Patient is calling back to check on status of the message please call them back today

## 2016-06-30 NOTE — Telephone Encounter (Signed)
Friend Shappard 01/11/1956. His cousin [name] would like to speak with you regarding his medication for his Seizures. He is having an x ray on Monday morning and they told him not to take the medicine until after. She wanted to make sure that was ok. Her # is [phone]. Thank you

## 2016-06-30 NOTE — Telephone Encounter (Signed)
Pts cousin [name] notified, verbalized understanding

## 2016-06-30 NOTE — Telephone Encounter (Signed)
Pt is scheduled for a colonoscopy Monday pt was advise to discontinue Keppra until after procedure. Pt wants to make sure this is okay with you.

## 2016-07-03 ENCOUNTER — Ambulatory Visit
Admission: RE | Admit: 2016-07-03 | Discharge: 2016-07-03 | Disposition: A | Discharge status: Home / Self Care | Payer: Medicare Other | Source: Ambulatory Visit | Attending: Surgery | Admitting: Surgery

## 2016-07-03 DIAGNOSIS — K50919 Crohn's disease, unspecified, with unspecified complications: Secondary | ICD-10-CM

## 2016-07-03 DIAGNOSIS — L988 Other specified disorders of the skin and subcutaneous tissue: Secondary | ICD-10-CM

## 2016-07-09 IMAGING — CT CT HEAD W/O CM
1 series · 15 of 30 positions shown, 19 images · non-contrast
Comparison: CT head/ cervical spine dated 04/10/2014

CLINICAL DATA: Syncope, fall, small laceration over left eye

EXAM:
CT HEAD WITHOUT CONTRAST
CT CERVICAL SPINE WITHOUT CONTRAST
TECHNIQUE: Multidetector CT imaging of the head and cervical spine was
performed following the standard protocol without intravenous
contrast. Multiplanar CT image reconstructions of the cervical spine
were also generated.

[Series 3: head 5.0 h30s · axial · 0.44mm/px · z∈[-134,+26]mm · 15 of 36 slices shown, 19 images]
[im 2/36  brain]
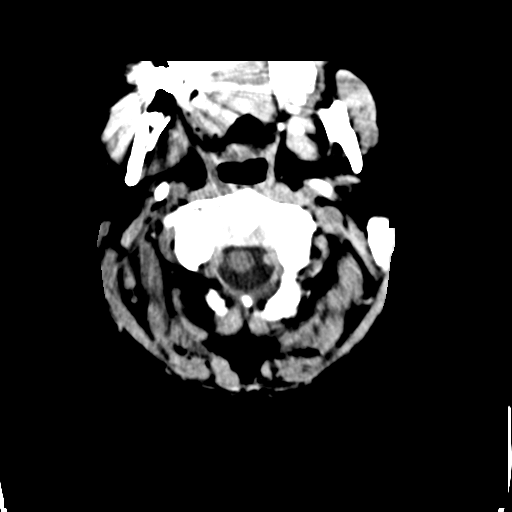
[im 2/36  bone]
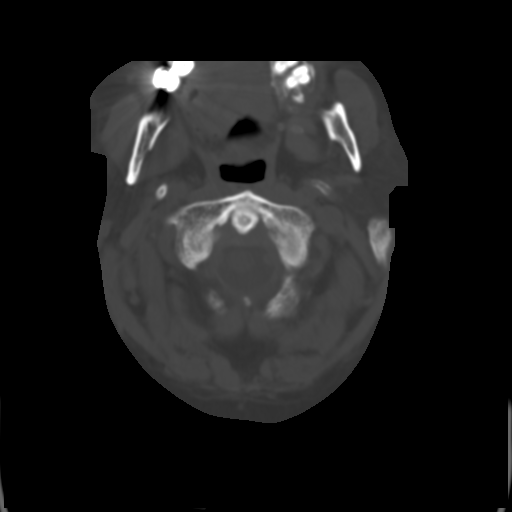
[im 4/36  brain]
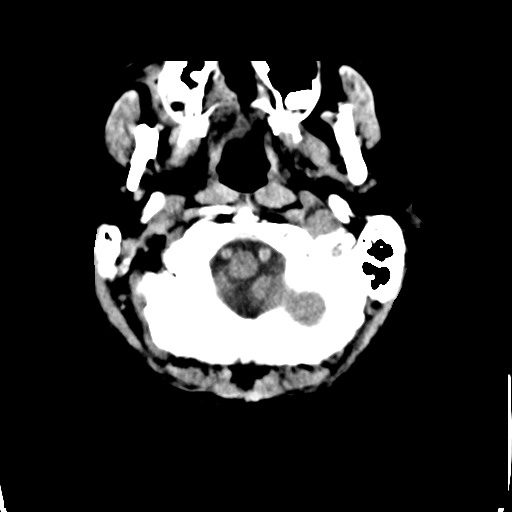
[im 7/36  brain]
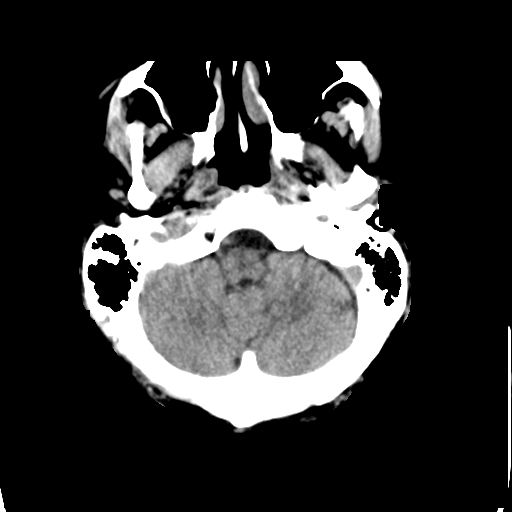
[im 9/36  brain]
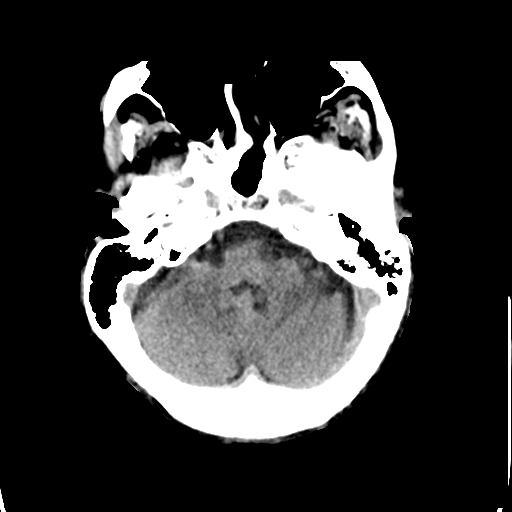
[im 11/36  brain]
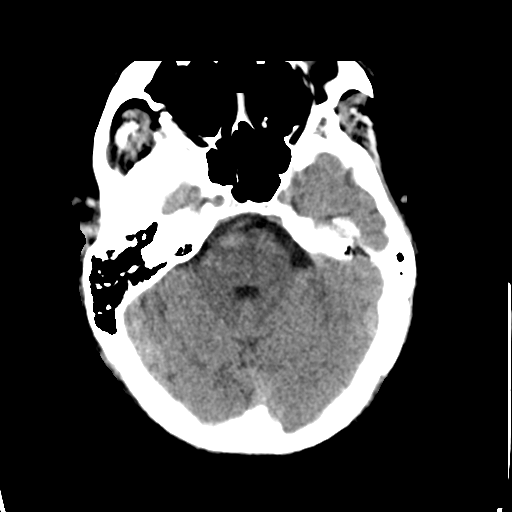
[im 11/36  bone]
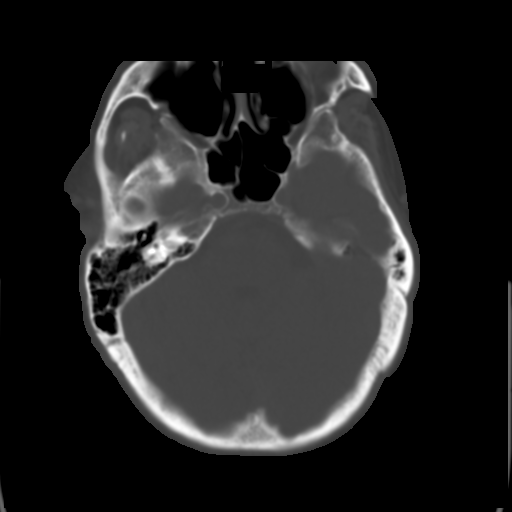
[im 14/36  brain]
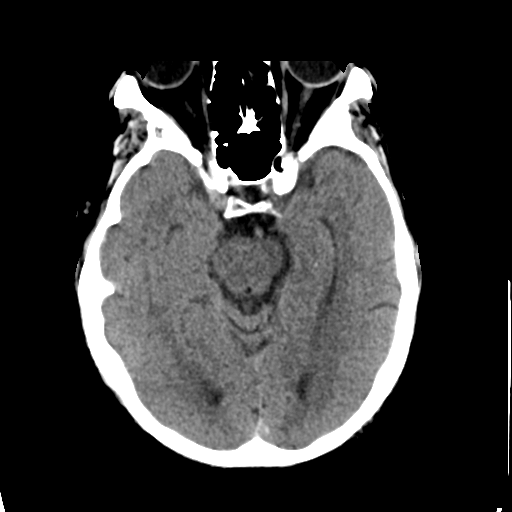
[im 16/36  brain]
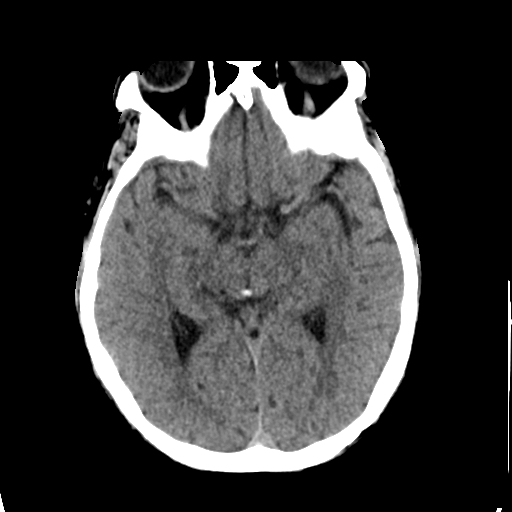
[im 19/36  brain]
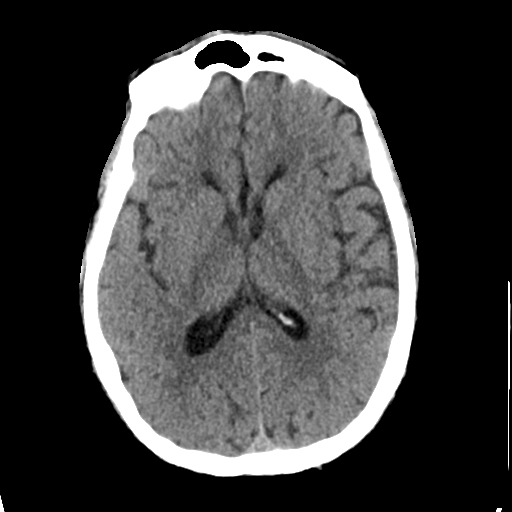
[im 20/36  brain]
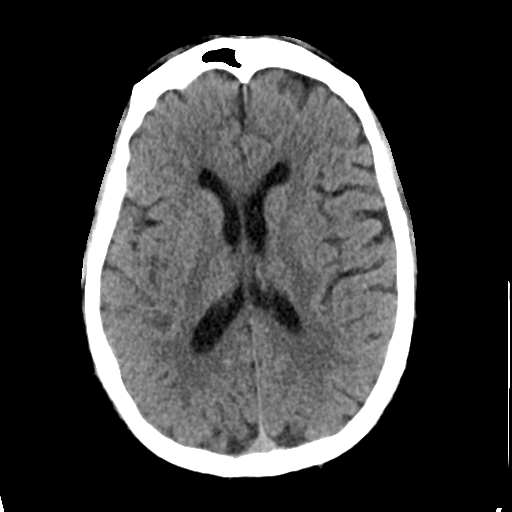
[im 20/36  bone]
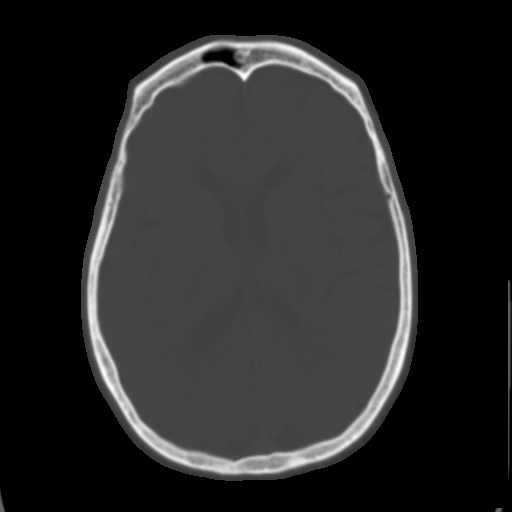
[im 22/36  brain]
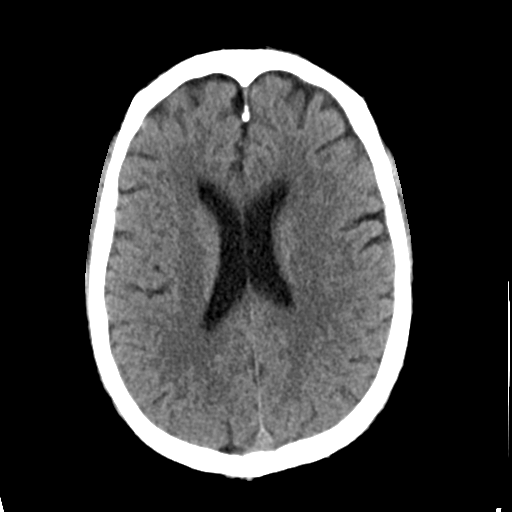
[im 25/36  brain]
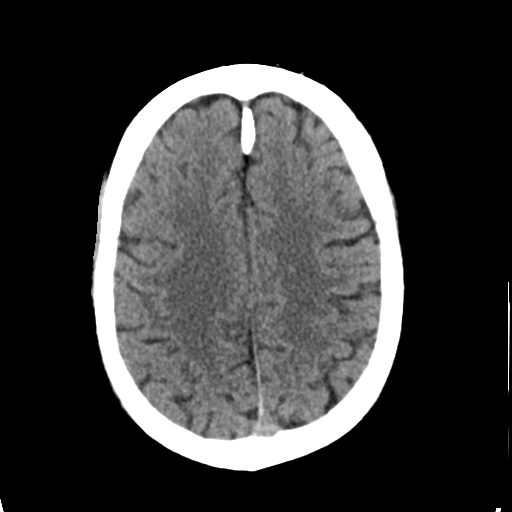
[im 27/36  brain]
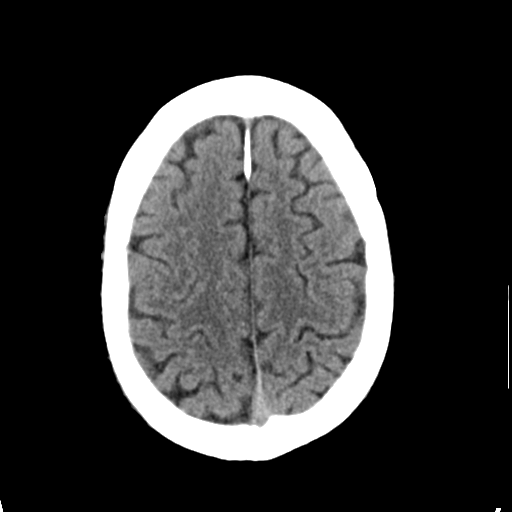
[im 29/36  brain]
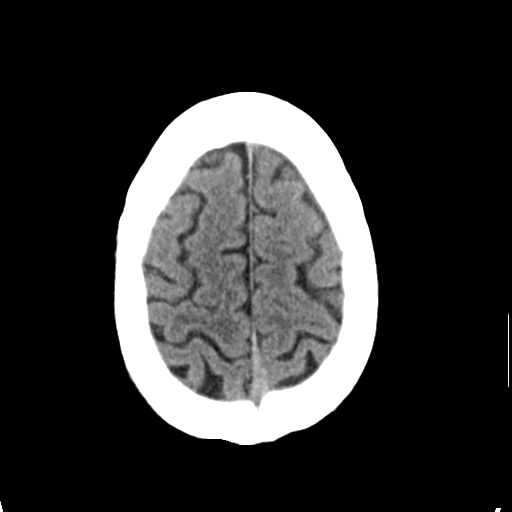
[im 29/36  bone]
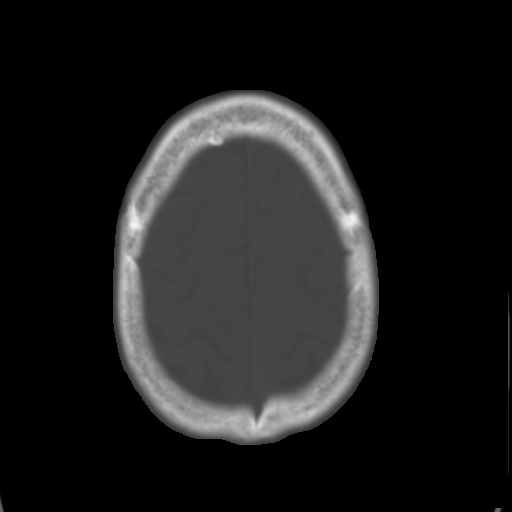
[im 32/36  brain]
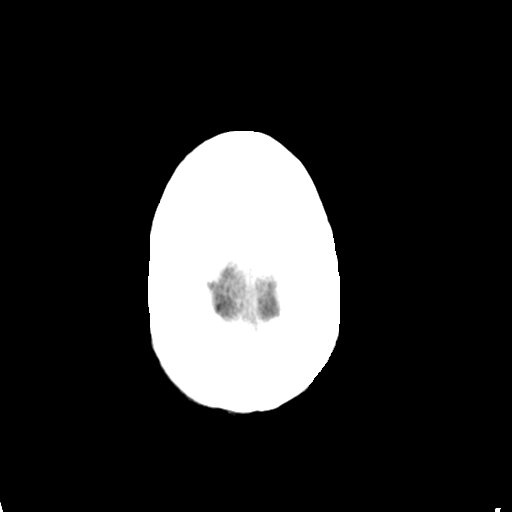
[im 34/36  brain]
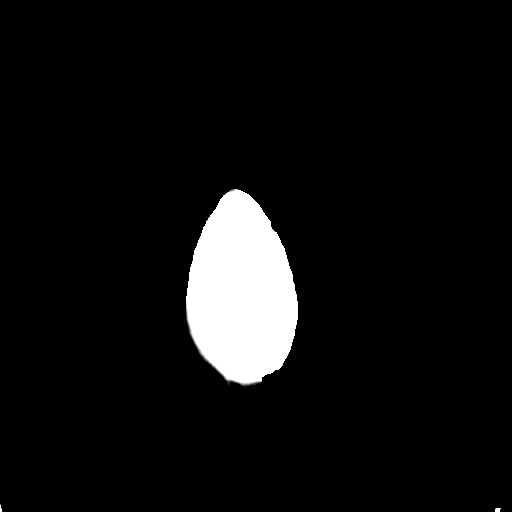

[15 of 30 positions shown; findings below may reference images not displayed]

FINDINGS: CT HEAD FINDINGS

No evidence of parenchymal hemorrhage or extra-axial fluid
collection. No mass lesion, mass effect, or midline shift.

No CT evidence of acute infarction.

Mild subcortical white matter and periventricular small vessel
ischemic changes.

Mild cortical atrophy.  No ventriculomegaly.

The visualized paranasal sinuses are essentially clear. Minimal
partial opacification the left mastoid air cells (series 4/image 9).

Mild soft tissue swelling/hematoma overlying the left frontal bone
(series 3/image 20).

No evidence of calvarial fracture.

CT CERVICAL SPINE FINDINGS

Normal cervical lordosis.

No evidence of fracture or dislocation. Vertebra body heights are
maintained. Dens appears intact.

No prevertebral soft tissue swelling.

Moderate degenerative changes with stable bulky anterior
osteophytosis.

Visualized thyroid is notable for stable multinodular goiter,
including a dominant 1.8 cm right thyroid nodule (series 4/image
92), unchanged and previously biopsied.

Visualized lung apices are clear.
IMPRESSION: Mild soft tissue swelling/hematoma overlying the left frontal bone.
No evidence of calvarial fracture.

No evidence of acute intracranial abnormality. Mild atrophy with
small vessel ischemic changes.

No evidence of traumatic injury to the cervical spine. Moderate
degenerative changes with bulky anterior osteophytosis.

## 2016-07-09 IMAGING — CT CT CERVICAL SPINE W/O CM
3 of 4 series · 13 of 33 positions shown, 16 images · non-contrast
Comparison: CT head/ cervical spine dated 04/10/2014

CLINICAL DATA: Syncope, fall, small laceration over left eye

EXAM:
CT HEAD WITHOUT CONTRAST
CT CERVICAL SPINE WITHOUT CONTRAST
TECHNIQUE: Multidetector CT imaging of the head and cervical spine was
performed following the standard protocol without intravenous
contrast. Multiplanar CT image reconstructions of the cervical spine
were also generated.

[Series 4: c_spine 2.0 i30s 3 · axial · 0.36mm/px · z∈[-281,-129]mm · 5 of 116 slices shown, 7 images]
[im 20/116  soft-tissue]
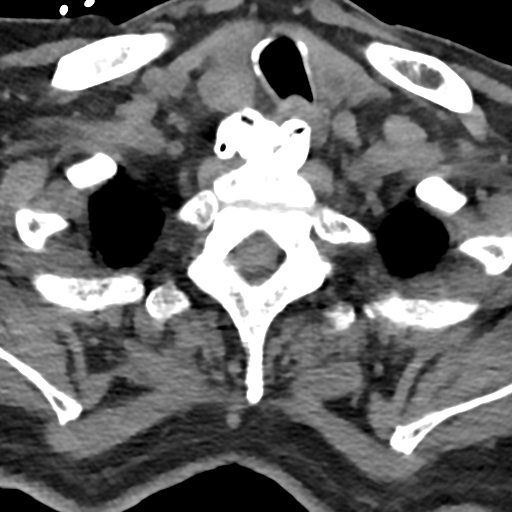
[im 20/116  bone]
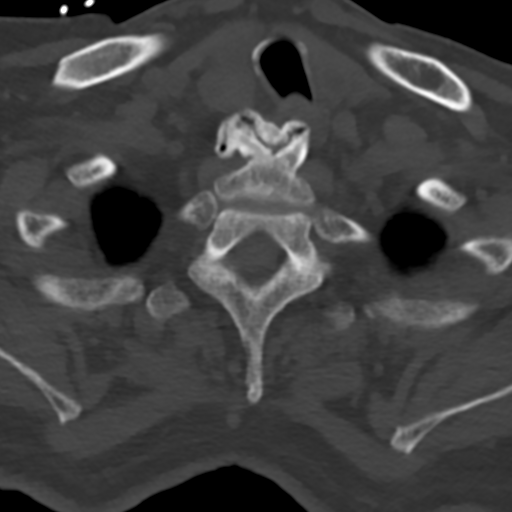
[im 39/116  bone]
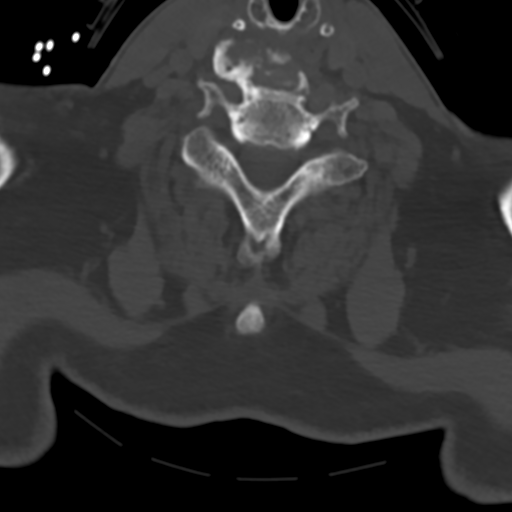
[im 58/116  bone]
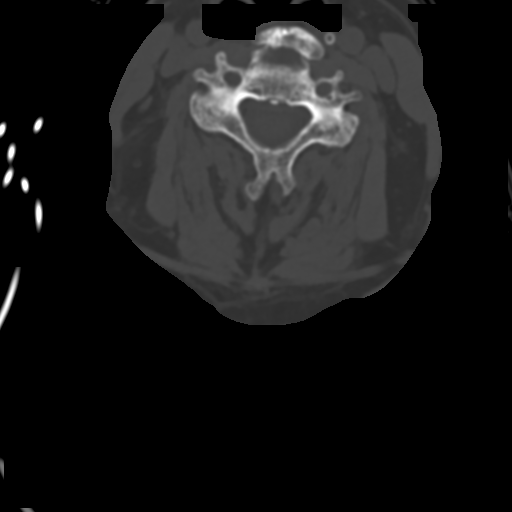
[im 77/116  bone]
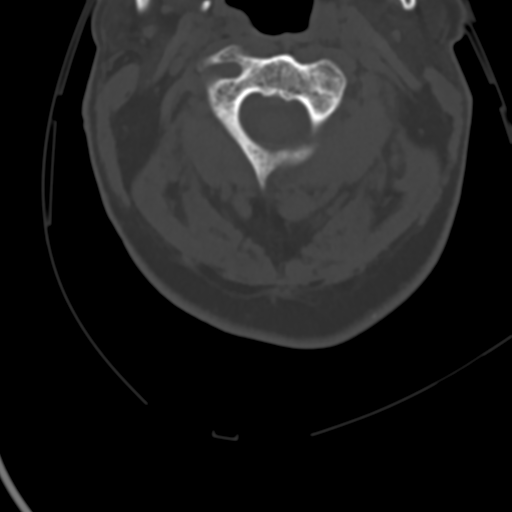
[im 96/116  soft-tissue]
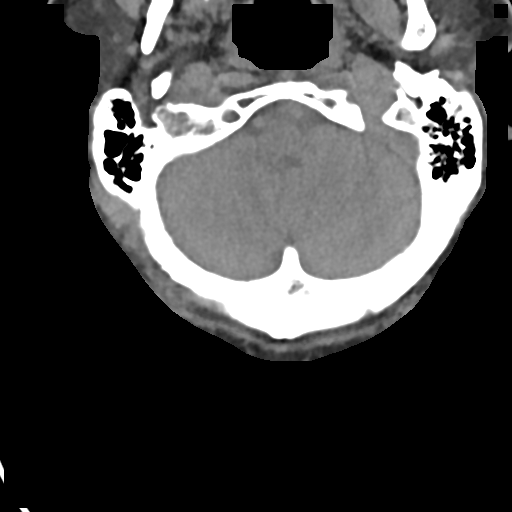
[im 96/116  bone]
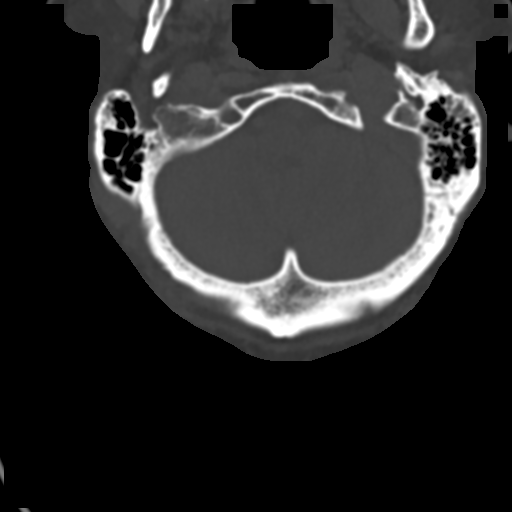

[Series 6: coronals · coronal · 0.32mm/px · 3 of 80 slices shown]
[im 16/80  bone]
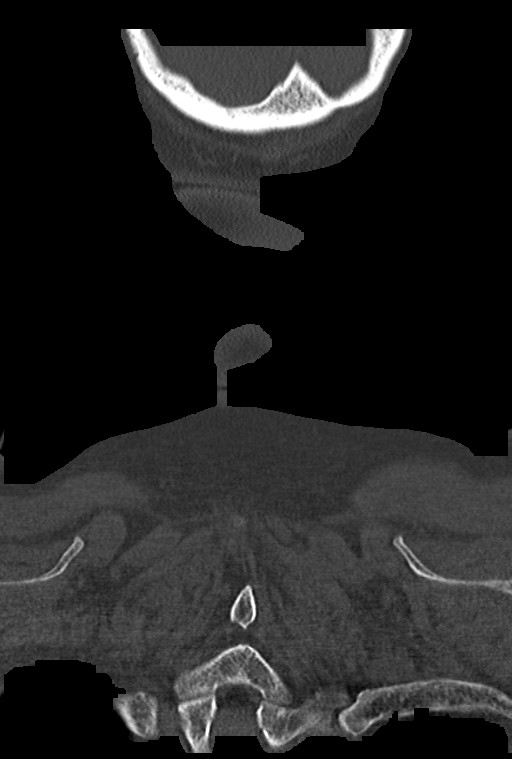
[im 32/80  bone]
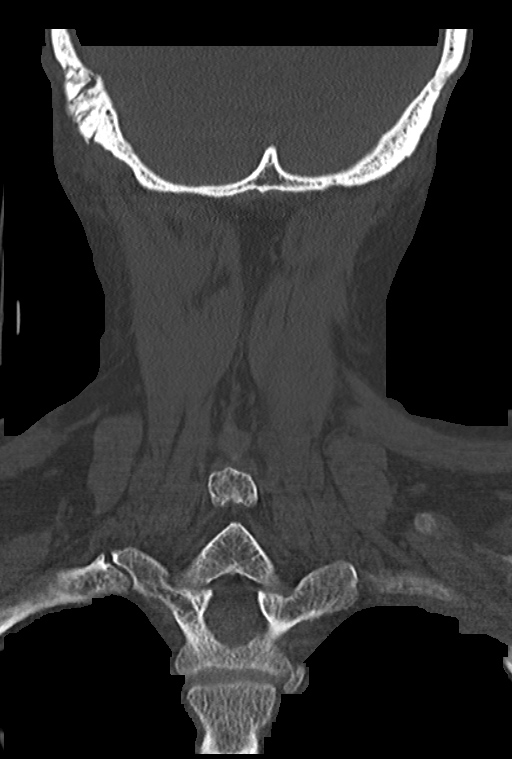
[im 48/80  bone]
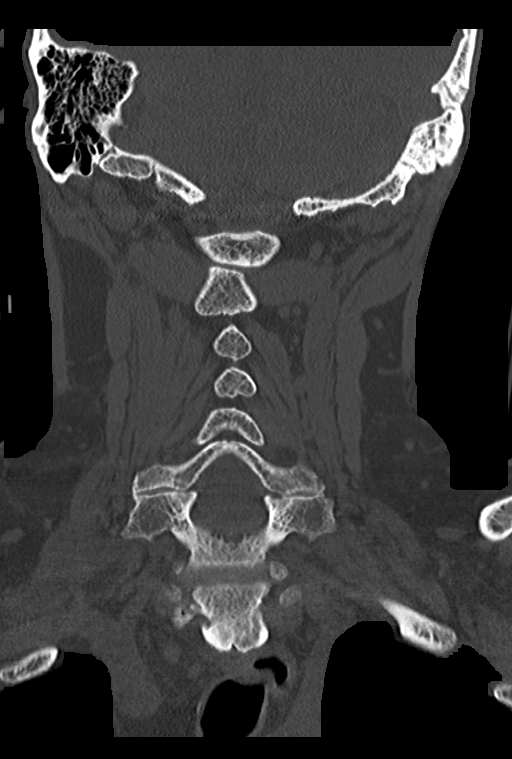

[Series 7: sagittals · sagittal · 0.34mm/px · 5 of 61 slices shown, 6 images]
[im 21/61  bone]
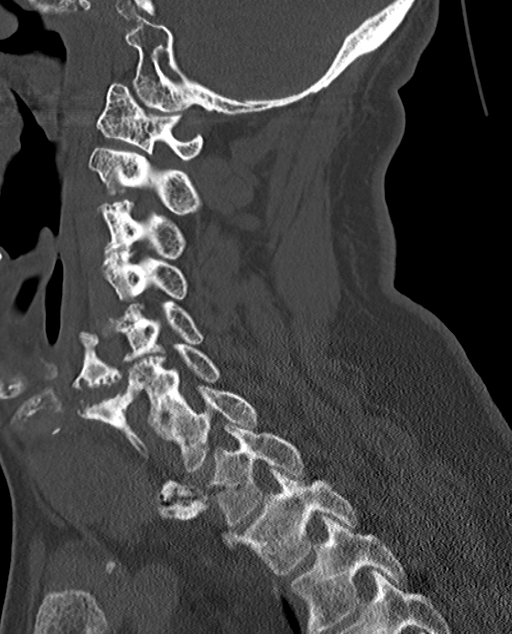
[im 26/61  bone]
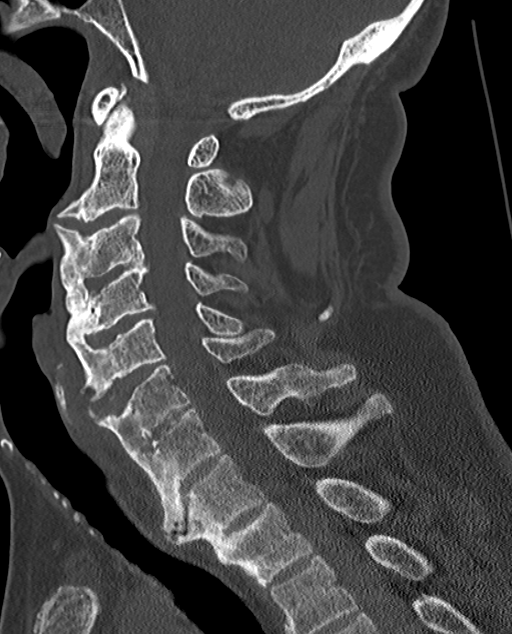
[im 31/61  soft-tissue]
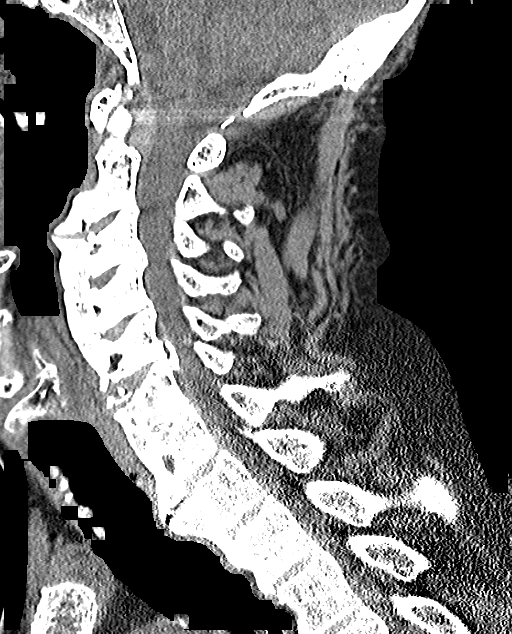
[im 31/61  bone]
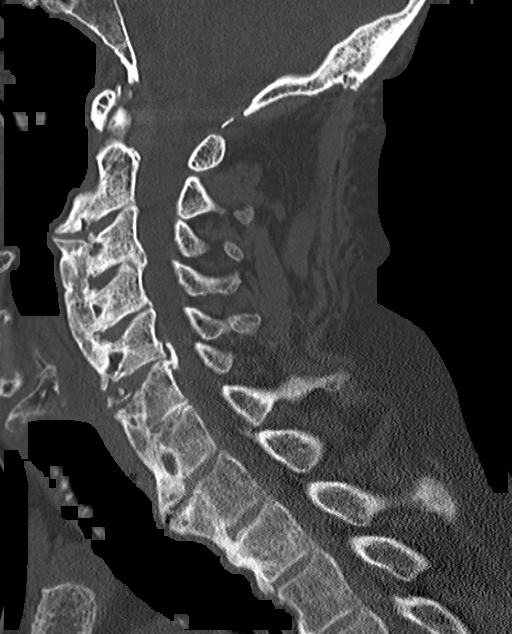
[im 36/61  bone]
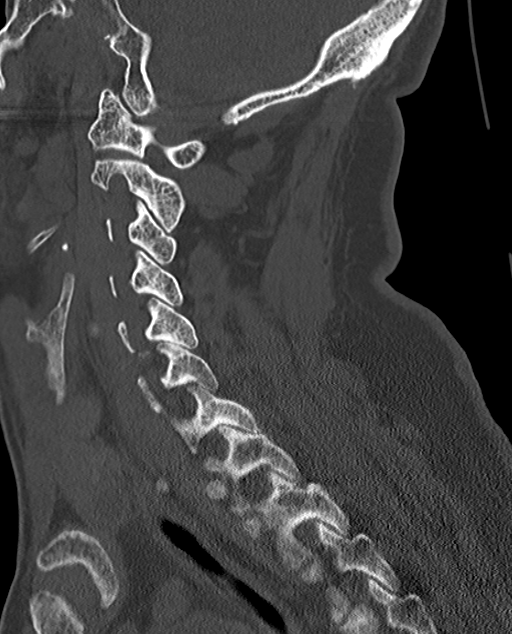
[im 41/61  bone]
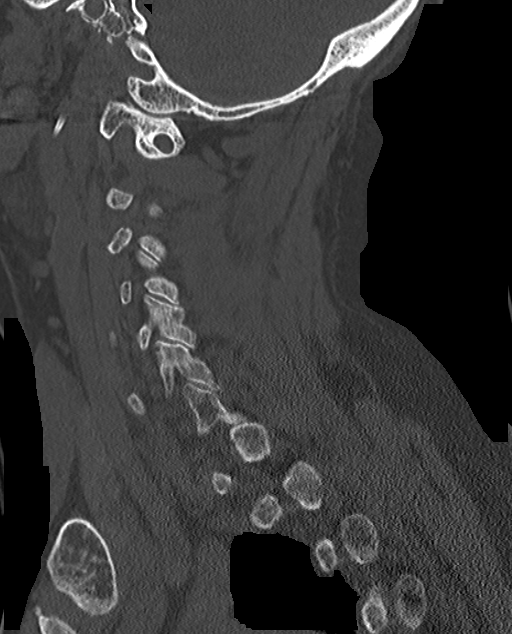

[13 of 33 positions shown; findings below may reference images not displayed]

FINDINGS: CT HEAD FINDINGS

No evidence of parenchymal hemorrhage or extra-axial fluid
collection. No mass lesion, mass effect, or midline shift.

No CT evidence of acute infarction.

Mild subcortical white matter and periventricular small vessel
ischemic changes.

Mild cortical atrophy.  No ventriculomegaly.

The visualized paranasal sinuses are essentially clear. Minimal
partial opacification the left mastoid air cells (series 4/image 9).

Mild soft tissue swelling/hematoma overlying the left frontal bone
(series 3/image 20).

No evidence of calvarial fracture.

CT CERVICAL SPINE FINDINGS

Normal cervical lordosis.

No evidence of fracture or dislocation. Vertebra body heights are
maintained. Dens appears intact.

No prevertebral soft tissue swelling.

Moderate degenerative changes with stable bulky anterior
osteophytosis.

Visualized thyroid is notable for stable multinodular goiter,
including a dominant 1.8 cm right thyroid nodule (series 4/image
92), unchanged and previously biopsied.

Visualized lung apices are clear.
IMPRESSION: Mild soft tissue swelling/hematoma overlying the left frontal bone.
No evidence of calvarial fracture.

No evidence of acute intracranial abnormality. Mild atrophy with
small vessel ischemic changes.

No evidence of traumatic injury to the cervical spine. Moderate
degenerative changes with bulky anterior osteophytosis.

## 2016-07-14 ENCOUNTER — Encounter: Payer: Medicare Other | Admitting: Internal Medicine

## 2016-07-18 ENCOUNTER — Other Ambulatory Visit: Payer: Self-pay | Admitting: Surgery

## 2016-07-18 NOTE — Progress Notes (Signed)
Scheduling for pre op- please PLACE SURGICAL ORDERS IN EPIC  thanks

## 2016-07-21 ENCOUNTER — Ambulatory Visit (AMBULATORY_SURGERY_CENTER): Payer: Self-pay | Admitting: *Deleted

## 2016-07-21 ENCOUNTER — Encounter (HOSPITAL_COMMUNITY): Payer: Self-pay

## 2016-07-21 VITALS — Ht 70.0 in | Wt 204.0 lb

## 2016-07-21 DIAGNOSIS — K50013 Crohn's disease of small intestine with fistula: Secondary | ICD-10-CM

## 2016-07-21 MED ORDER — NA SULFATE-K SULFATE-MG SULF 17.5-3.13-1.6 GM/177ML PO SOLN: 1.0000 | Freq: Once | ORAL | 0 refills | Status: AC

## 2016-07-21 NOTE — Progress Notes (Signed)
Pt not present for PV, mental disability. [name] (pt cousin) relayed history and was given prep instructions. Phone call to [name] [facility]) for consent.  Denies allergies to eggs or soy products. Denies complications with sedation or anesthesia. Denies O2 use. Denies use of diet or weight loss medications.

## 2016-07-21 NOTE — Patient Instructions (Signed)
[name]  07/21/2016   Your procedure is scheduled on: Tuesday 08/01/2016  Report to [facility] Main  Entrance take [place]  elevators to 3rd floor to  [place] at   [place] AM.  Call this number if you have problems the morning of surgery [phone]   Remember: ONLY 1 PERSON MAY GO WITH YOU TO SHORT STAY TO GET  READY MORNING OF [place].   Do not eat food or drink liquids :After Midnight.     Take these medicines the morning of surgery with A SIP OF WATER: Keppra, Primidone (Mysoline)                                 You may not have any metal on your body including hair pins and              piercings  Do not wear jewelry, make-up, lotions, powders or perfumes, deodorant             Do not wear nail polish.  Do not shave  48 hours prior to surgery.              Men may shave face and neck.   Do not bring valuables to the hospital. [place].  Contacts, dentures or bridgework may not be worn into surgery.  Leave suitcase in the car. After surgery it may be brought to your room.                  Please read over the following fact sheets you were given: _____________________________________________________________________             [facility] - Preparing for Surgery Before surgery, you can play an important role.  Because skin is not sterile, your skin needs to be as free of germs as possible.  You can reduce the number of germs on your skin by washing with CHG (chlorahexidine gluconate) soap before surgery.  CHG is an antiseptic cleaner which kills germs and bonds with the skin to continue killing germs even after washing. Please DO NOT use if you have an allergy to CHG or antibacterial soaps.  If your skin becomes reddened/irritated stop using the CHG and inform your nurse when you arrive at Short Stay. Do not shave (including legs and underarms) for at least 48 hours prior to the  first CHG shower.  You may shave your face/neck. Please follow these instructions carefully:  1.  Shower with CHG Soap the night before surgery and the  morning of Surgery.  2.  If you choose to wash your hair, wash your hair first as usual with your  normal  shampoo.  3.  After you shampoo, rinse your hair and body thoroughly to remove the  shampoo.                           4.  Use CHG as you would any other liquid soap.  You can apply chg directly  to the skin and wash                       Gently with a scrungie or clean washcloth.  5.  Apply the CHG Soap to your body ONLY FROM THE NECK DOWN.   Do not use on face/ open                           Wound or open sores. Avoid contact with eyes, ears mouth and genitals (private parts).                       Wash face,  Genitals (private parts) with your normal soap.             6.  Wash thoroughly, paying special attention to the area where your surgery  will be performed.  7.  Thoroughly rinse your body with warm water from the neck down.  8.  DO NOT shower/wash with your normal soap after using and rinsing off  the CHG Soap.                9.  Pat yourself dry with a clean towel.            10.  Wear clean pajamas.            11.  Place clean sheets on your bed the night of your first shower and do not  sleep with pets. Day of Surgery : Do not apply any lotions/deodorants the morning of surgery.  Please wear clean clothes to the hospital/surgery center.  FAILURE TO FOLLOW THESE INSTRUCTIONS MAY RESULT IN THE CANCELLATION OF YOUR SURGERY PATIENT SIGNATURE_________________________________  NURSE SIGNATURE__________________________________  ________________________________________________________________________   [id number]

## 2016-07-24 ENCOUNTER — Encounter (HOSPITAL_COMMUNITY): Payer: Medicare Other

## 2016-07-24 ENCOUNTER — Encounter (HOSPITAL_COMMUNITY)
Admission: RE | Admit: 2016-07-24 | Discharge: 2016-07-24 | Disposition: A | Discharge status: Home / Self Care | Payer: Medicare Other | Source: Ambulatory Visit | Attending: Surgery | Admitting: Surgery

## 2016-07-24 ENCOUNTER — Encounter (HOSPITAL_COMMUNITY): Payer: Self-pay

## 2016-07-24 DIAGNOSIS — E119 Type 2 diabetes mellitus without complications: Secondary | ICD-10-CM | POA: Insufficient documentation

## 2016-07-24 DIAGNOSIS — Z01818 Encounter for other preprocedural examination: Secondary | ICD-10-CM | POA: Diagnosis present

## 2016-07-24 LAB — COMPREHENSIVE METABOLIC PANEL
ALT: 37 U/L (ref 17–63)
ANION GAP: 6 (ref 5–15)
AST: 33 U/L (ref 15–41)
Albumin: 4 g/dL (ref 3.5–5.0)
Alkaline Phosphatase: 141 U/L — ABNORMAL HIGH (ref 38–126)
BILIRUBIN TOTAL: 0.6 mg/dL (ref 0.3–1.2)
BUN: 17 mg/dL (ref 6–20)
CHLORIDE: 101 mmol/L (ref 101–111)
CO2: 29 mmol/L (ref 22–32)
Calcium: 8.8 mg/dL — ABNORMAL LOW (ref 8.9–10.3)
Creatinine, Ser: 1.12 mg/dL (ref 0.61–1.24)
Glucose, Bld: 90 mg/dL (ref 65–99)
POTASSIUM: 4.3 mmol/L (ref 3.5–5.1)
Sodium: 136 mmol/L (ref 135–145)
TOTAL PROTEIN: 8.2 g/dL — AB (ref 6.5–8.1)

## 2016-07-24 LAB — CBC WITH DIFFERENTIAL/PLATELET
BASOS ABS: 0.1 10*3/uL (ref 0.0–0.1)
Basophils Relative: 1 %
EOS PCT: 0 %
Eosinophils Absolute: 0 10*3/uL (ref 0.0–0.7)
HEMATOCRIT: 44.9 % (ref 39.0–52.0)
Hemoglobin: 14.8 g/dL (ref 13.0–17.0)
LYMPHS ABS: 1 10*3/uL (ref 0.7–4.0)
LYMPHS PCT: 16 %
MCH: 32.3 pg (ref 26.0–34.0)
MCHC: 33 g/dL (ref 30.0–36.0)
MCV: 98 fL (ref 78.0–100.0)
MONO ABS: 0.7 10*3/uL (ref 0.1–1.0)
MONOS PCT: 11 %
NEUTROS ABS: 4.5 10*3/uL (ref 1.7–7.7)
Neutrophils Relative %: 72 %
PLATELETS: 304 10*3/uL (ref 150–400)
RBC: 4.58 MIL/uL (ref 4.22–5.81)
RDW: 14.5 % (ref 11.5–15.5)
WBC: 6.3 10*3/uL (ref 4.0–10.5)

## 2016-07-24 NOTE — Progress Notes (Signed)
01/18/2016- Consulted Dr. Lillia Abed, Anesthesia about EKG and ok with EKG for surgery. ED Physician had read EKG then. Assess patient to have no symptoms.

## 2016-07-25 LAB — HEMOGLOBIN A1C
Hgb A1c MFr Bld: 5.3 % (ref 4.8–5.6)
MEAN PLASMA GLUCOSE: 105 mg/dL

## 2016-07-26 ENCOUNTER — Encounter: Payer: Self-pay | Admitting: Internal Medicine

## 2016-07-26 ENCOUNTER — Other Ambulatory Visit: Payer: Self-pay | Admitting: Internal Medicine

## 2016-07-26 ENCOUNTER — Ambulatory Visit (AMBULATORY_SURGERY_CENTER): Payer: Medicare Other | Admitting: Internal Medicine

## 2016-07-26 VITALS — BP 104/53 | HR 60 | Temp 96.8°F | Resp 14 | Ht 70.0 in | Wt 204.0 lb

## 2016-07-26 DIAGNOSIS — K5000 Crohn's disease of small intestine without complications: Secondary | ICD-10-CM | POA: Diagnosis present

## 2016-07-26 MED ORDER — SODIUM CHLORIDE 0.9 % IV SOLN: 500.0000 mL | INTRAVENOUS | Status: DC

## 2016-07-26 NOTE — Progress Notes (Signed)
Report to PACU, RN, vss, BBS= Clear.  

## 2016-07-26 NOTE — Patient Instructions (Addendum)
YOU HAD AN ENDOSCOPIC PROCEDURE TODAY AT [facility]:   Refer to the procedure report that was given to you for any specific questions about what was found during the examination.  If the procedure report does not answer your questions, please call your gastroenterologist to clarify.  If you requested that your care partner not be given the details of your procedure findings, then the procedure report has been included in a sealed envelope for you to review at your convenience later.  YOU SHOULD EXPECT: Some feelings of bloating in the abdomen. Passage of more gas than usual.  Walking can help get rid of the air that was put into your GI tract during the procedure and reduce the bloating. If you had a lower endoscopy (such as a colonoscopy or flexible sigmoidoscopy) you may notice spotting of blood in your stool or on the toilet paper. If you underwent a bowel prep for your procedure, you may not have a normal bowel movement for a few days.  Please Note:  You might notice some irritation and congestion in your nose or some drainage.  This is from the oxygen used during your procedure.  There is no need for concern and it should clear up in a day or so.  SYMPTOMS TO REPORT IMMEDIATELY:   Following lower endoscopy (colonoscopy or flexible sigmoidoscopy):  Excessive amounts of blood in the stool  Significant tenderness or worsening of abdominal pains  Swelling of the abdomen that is new, acute  Fever of 100F or higher   For urgent or emergent issues, a gastroenterologist can be reached at any hour by calling [phone].   DIET:  We do recommend a small meal at first, but then you may proceed to your regular diet.  Drink plenty of fluids but you should avoid alcoholic beverages for 24 hours.  ACTIVITY:  You should plan to take it easy for the rest of today and you should NOT DRIVE or use heavy machinery until tomorrow (because of the sedation medicines used during the test).     FOLLOW UP: Our staff will call the number listed on your records the next business day following your procedure to check on you and address any questions or concerns that you may have regarding the information given to you following your procedure. If we do not reach you, we will leave a message.  However, if you are feeling well and you are not experiencing any problems, there is no need to return our call.  We will assume that you have returned to your regular daily activities without incident.  If any biopsies were taken you will be contacted by phone or by letter within the next 1-3 weeks.  Please call us at [phone] if you have not heard about the biopsies in 3 weeks.    SIGNATURES/CONFIDENTIALITY: You and/or your care partner have signed paperwork which will be entered into your electronic medical record.  These signatures attest to the fact that that the information above on your After Visit Summary has been reviewed and is understood.  Full responsibility of the confidentiality of this discharge information lies with you and/or your care-partner.  Read all of the handouts given to you by your recovery room nurse.   Thank-you for choosing [place] for your healthcare needs today.YOU HAD AN ENDOSCOPIC PROCEDURE TODAY AT [facility]:   Refer to the procedure report that was given to you for any specific questions about what was found during  the examination.  If the procedure report does not answer your questions, please call your gastroenterologist to clarify.  If you requested that your care partner not be given the details of your procedure findings, then the procedure report has been included in a sealed envelope for you to review at your convenience later.  YOU SHOULD EXPECT: Some feelings of bloating in the abdomen. Passage of more gas than usual.  Walking can help get rid of the air that was put into your GI tract during the procedure and reduce the bloating. If you had a  lower endoscopy (such as a colonoscopy or flexible sigmoidoscopy) you may notice spotting of blood in your stool or on the toilet paper. If you underwent a bowel prep for your procedure, you may not have a normal bowel movement for a few days.  Please Note:  You might notice some irritation and congestion in your nose or some drainage.  This is from the oxygen used during your procedure.  There is no need for concern and it should clear up in a day or so.  SYMPTOMS TO REPORT IMMEDIATELY:   Following lower endoscopy (colonoscopy or flexible sigmoidoscopy):  Excessive amounts of blood in the stool  Significant tenderness or worsening of abdominal pains  Swelling of the abdomen that is new, acute  Fever of 100F or higher   Following upper endoscopy (EGD)  Vomiting of blood or coffee ground material  New chest pain or pain under the shoulder blades  Painful or persistently difficult swallowing  New shortness of breath  Fever of 100F or higher  Black, tarry-looking stools  For urgent or emergent issues, a gastroenterologist can be reached at any hour by calling [phone].   DIET:  We do recommend a small meal at first, but then you may proceed to your regular diet.  Drink plenty of fluids but you should avoid alcoholic beverages for 24 hours.  ACTIVITY:  You should plan to take it easy for the rest of today and you should NOT DRIVE or use heavy machinery until tomorrow (because of the sedation medicines used during the test).    FOLLOW UP: Our staff will call the number listed on your records the next business day following your procedure to check on you and address any questions or concerns that you may have regarding the information given to you following your procedure. If we do not reach you, we will leave a message.  However, if you are feeling well and you are not experiencing any problems, there is no need to return our call.  We will assume that you have returned to your  regular daily activities without incident.  If any biopsies were taken you will be contacted by phone or by letter within the next 1-3 weeks.  Please call us at [phone] if you have not heard about the biopsies in 3 weeks.    SIGNATURES/CONFIDENTIALITY: You and/or your care partner have signed paperwork which will be entered into your electronic medical record.  These signatures attest to the fact that that the information above on your After Visit Summary has been reviewed and is understood.  Full responsibility of the confidentiality of this discharge information lies with you and/or your care-partner.  Return to GI clinic after colostomy takedown.  Repeat colonoscopy in 1 year.

## 2016-07-26 NOTE — Op Note (Signed)
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 The patient's tolerance of                            previous anesthesia was also reviewed. The risks                            and benefits of the procedure and the sedation                            options and risks were discussed with the patient.                            All questions were answered, and informed consent                            was obtained. Prior Anticoagulants: The patient has                            taken no previous anticoagulant or antiplatelet                            agents. ASA Grade Assessment: III - A patient with  severe systemic disease. After reviewing the risks                            and benefits, the patient was deemed in                            satisfactory condition to undergo the procedure.                           After obtaining informed consent, the colonoscope                            was passed under direct vision. Throughout the                            procedure, the patient's blood pressure, pulse, and                        oxygen saturations were monitored continuously. The                            [id number]) was introduced through the anus                            and advanced to the sigmoid/Hartmann's pouch and                            then introduced via the transverse colostomy to the                            neo-terminal ileum. The colonoscopy was performed                            without difficulty. The patient tolerated the                            procedure well. The quality of the bowel                            preparation was good/adequate. The rectum was                            photographed. Scope In: 3:46:42 PM Scope Out: 4:05:29 PM Total Procedure Duration: 0 hours 18 minutes 47 seconds  Findings:                 The digital rectal exam was normal.                           Patchy mild inflammation characterized by erythema                            and friability was found in the rectum and in the  recto-sigmoid colon. 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                           The sigmoid colon appeared normal with stool                            present in the mid-sigmoid presumably near the                            Hartmann's pouch. The stool interfered with                            visualization in this segment of sigmoid.                           There was evidence of a patent end colostomy in the                            transverse colon. This was characterized by healthy                            appearing mucosa.                           A benign-appearing, intrinsic mild stenosis was                            found at the ileo-colonic anastomosis and was                            traversed. The transverse colon in approximately 30                            cm long and at the neo-terminal ileum there  is                            narrowing and after trial and error the                            neo-terminal ileum was entered. The stenosis                            appears benign and is short-segment (1-2 cm).                           The transverse colon appeared normal.                           The neo-terminal ileum appeared normal.  Complications:            No immediate complications. Estimated Blood Loss:     Estimated blood loss: none. Impression:               - Patchy mild inflammation was found in the rectum                            and in the recto-sigmoid colon secondary to                            diversion colitis.                           - The sigmoid colon is normal with stool obscuring                            visualization near the Hartmann's pouch.                           - Patent end colostomy with healthy appearing                            mucosa in the transverse colon.                           - Stricture, mild and traversable, at the                            ileo-colonic anastomosis (evidence of prior right                            hemicolectomy).                           - The examined portion of the neo-terminal ileum                            was normal.                           - No specimens collected. Recommendation:           - Patient has a contact number available for                            emergencies. The signs and symptoms of potential                            delayed complications were discussed with the                            patient. Return to normal activities tomorrow.                            Written discharge instructions were provided to the  patient.                           - Resume previous diet.                           - Continue present medications.                           - Repeat colonoscopy in 1 year for surveillance.                           - Return to GI clinic  after colostomy takedown. Jerene Bears, MD 07/26/2016 4:40:55 PM This report has been signed electronically.

## 2016-07-27 ENCOUNTER — Telehealth: Payer: Self-pay | Admitting: *Deleted

## 2016-07-27 NOTE — Telephone Encounter (Signed)
  Follow up Call-  Call back number 07/26/2016 11/06/2014  Post procedure Call Back phone  # [phone]-COUSIN NUMBER OK TO [place]. [phone]  Permission to leave phone message Yes Yes  Some recent data might be hidden    Spoke with cousin Patient questions:  Do you have a fever, pain , or abdominal swelling? No. Pain Score  0 *  Have you tolerated food without any problems? Yes.    Have you been able to return to your normal activities? Yes.    Do you have any questions about your discharge instructions: Diet   No. Medications  No. Follow up visit  No.  Do you have questions or concerns about your Care? No.  Actions: * If pain score is 4 or above: No action needed, pain <4.

## 2016-07-27 NOTE — Telephone Encounter (Signed)
No answer at either number.   No identifiers.   Left messages that we will attempt to call again later.

## 2016-07-31 NOTE — H&P (Signed)
[name]  Location: [facility] Patient #: [id number] DOB: 28-Nov-1955 Single / Language: [name] / Race: White Male  History of Present Illness   The patient is a 60 year old male who presents with a complaint of Follow up for colon surgery.    His PCP is - none  He sees [name] for GI.  He is accompanied by his cousin [name] and her husband [name].   His wound is healed.  He is ready to reverse his colostomy.   Addendum Note(Laurieanne Galloway H. Ruthe Roemer MD; 07/28/2016 6:27 PM) message from [name] that there is a stricture at the ileo-colonic anastomosis - but not critical. The rest of the colonoscopy okay.   [name] has talked to him about starting Humira, but that is on hold until the wound is healed.   History of colon surgery: He underwent a Laparoscopic enterolysis of adhesions (45 minutes), laparoscopic-assisted small bowel resection, hand assisted left hemicolectomy with mobilization of splenic flexure, and left transverse colon end colostomy with Hartmann pouch on 12/04/2015 by Dr. Lucia Gaskins for a SBO and history of Crohn's disease. He final pathology was benign. I gave the family a copy of his pathology. He is followed by Dr. Zenovia Jarred for his Crohn's disease. He went temporarily at [facility]. He would like to get back to [place], but they do not manage wounds.  Past medical history: 1. Mr. Schaum is at [facility] in [place].  He is normally at [place], but it does not look like he is going back there. 2. History of Crohn's disease  Now followed by [name] who is contemplating biologics (Humira) to treat the Crohn's. 3. Seizure disorder 4. Learning challenged. 5. Idiopathic epilepsy  Social History: Unmarried. He was living at [place] --> he has moved to [facility] in [place]. His sister, [name], is from [place] - she has power of atty His cousin, [name] (H:  [phone], C: [phone]), is with him. Her husband [name] is with her. They have a place in [facility] that they like to go to.   Other Problems Shann Medal, MD; 06/22/2016 1:07 PM) Crohn's Disease Other disease, cancer, significant illness Seizure Disorder  Past Surgical History Shann Medal, MD; 06/22/2016 1:07 PM) Resection of Small Bowel  Allergies Elbert Ewings, CMA; 06/22/2016 4:19 PM) No Known Drug Allergies03/20/2017  Medication History Elbert Ewings, CMA; 06/22/2016 4:22 PM) Cyanocobalamin (1000MCG/ML Solution, Injection 1 ML every 30 days) Active. Furosemide (20MG Tablet, Oral TWo times daily) Active. LevETIRAcetam (500MG Tablet, Oral) Active. Potassium Chloride (20MEQ Tablet ER, Oral) Active. Primidone (250MG Tablet, Oral bID) Active. Vitamin D (50000U Tablet, Oral WEekly) Active. Benadryl (25MG Tablet, Oral) Active. Ondansetron HCl (4MG Tablet, Oral) Active. Medications Reconciled  Vitals Elbert Ewings CMA; 06/22/2016 4:22 PM) 06/22/2016 4:22 PM Weight: 203 lb Height: 70in Body Surface Area: 2.1 m Body Mass Index: 29.13 kg/m  Temp.: 97.48F(Temporal)  Pulse: 63 (Regular)  BP: 130/74 (Sitting, Left Arm, Standard)   Physical Exam  General: WN older WM alert. HEENT: Normal. Pupils equal.   Neck: Supple. No mass. No thyroid mass.  Lymph Nodes: No supraclavicular or cervical nodes.  Lungs: Clear to auscultation and symmetric breath sounds. Heart: RRR. No murmur or rub.  Abdomen: Soft. No mass. Ostomy in LUQ.   Midline wound is healed.  Extremities: Good strength and ROM in upper and lower extremities.   Assessment & Plan  1.  CROHN'S DISEASE WITH FISTULA, UNSPECIFIED GASTROINTESTINAL TRACT LOCATION (K50.913)  Story: Laparoscopic enterolysis of adhesions (45 minutes), laparoscopic-assisted small bowel resection, hand assisted left hemicolectomy with mobilization of splenic flexure, and left transverse colon end  colostomy with [name] pouch - 12/04/2015 - D. Zehra Rucci  Plan:   1) BE to evaluate remaining colon - showed evidence of prior right hemicolectomy.  Stricture at ileocolonic anastomosis.  Otherwise okay.   2)  Colonoscopy by [name] that there is a stricture at the ileo-colonic anastomosis - but not critical. The rest of the colonoscopy okay.   3)  Reversal of colostomy  2. Seizure disorder 3. Learning challenged.  He has moved to [facility] in [place] 4. Idiopathic epilepsy   Alphonsa Overall, MD, [facility] Surgery Pager: [phone] Office phone:  [phone]

## 2016-08-01 ENCOUNTER — Encounter (HOSPITAL_COMMUNITY): Payer: Self-pay | Admitting: *Deleted

## 2016-08-01 ENCOUNTER — Inpatient Hospital Stay (HOSPITAL_COMMUNITY): Payer: Medicare Other | Admitting: Certified Registered Nurse Anesthetist

## 2016-08-01 ENCOUNTER — Inpatient Hospital Stay (HOSPITAL_COMMUNITY)
Admission: RE | Admit: 2016-08-01 | Discharge: 2016-08-09 | DRG: 330 | Disposition: A | Discharge status: Home / Self Care | Payer: Medicare Other | Source: Ambulatory Visit | Attending: Surgery | Admitting: Surgery

## 2016-08-01 ENCOUNTER — Encounter (HOSPITAL_COMMUNITY)
Admission: RE | Disposition: A | Discharge status: Home / Self Care | Payer: Self-pay | Source: Ambulatory Visit | Attending: Surgery

## 2016-08-01 DIAGNOSIS — I351 Nonrheumatic aortic (valve) insufficiency: Secondary | ICD-10-CM | POA: Diagnosis present

## 2016-08-01 DIAGNOSIS — M7989 Other specified soft tissue disorders: Secondary | ICD-10-CM | POA: Diagnosis not present

## 2016-08-01 DIAGNOSIS — Z23 Encounter for immunization: Secondary | ICD-10-CM | POA: Diagnosis not present

## 2016-08-01 DIAGNOSIS — G40009 Localization-related (focal) (partial) idiopathic epilepsy and epileptic syndromes with seizures of localized onset, not intractable, without status epilepticus: Secondary | ICD-10-CM | POA: Diagnosis present

## 2016-08-01 DIAGNOSIS — Z7901 Long term (current) use of anticoagulants: Secondary | ICD-10-CM

## 2016-08-01 DIAGNOSIS — M79609 Pain in unspecified limb: Secondary | ICD-10-CM | POA: Diagnosis not present

## 2016-08-01 DIAGNOSIS — I82402 Acute embolism and thrombosis of unspecified deep veins of left lower extremity: Secondary | ICD-10-CM | POA: Diagnosis present

## 2016-08-01 DIAGNOSIS — K50813 Crohn's disease of both small and large intestine with fistula: Secondary | ICD-10-CM | POA: Diagnosis present

## 2016-08-01 DIAGNOSIS — K660 Peritoneal adhesions (postprocedural) (postinfection): Secondary | ICD-10-CM | POA: Diagnosis present

## 2016-08-01 DIAGNOSIS — K50913 Crohn's disease, unspecified, with fistula: Secondary | ICD-10-CM | POA: Diagnosis present

## 2016-08-01 DIAGNOSIS — Z433 Encounter for attention to colostomy: Principal | ICD-10-CM

## 2016-08-01 DIAGNOSIS — Z9049 Acquired absence of other specified parts of digestive tract: Secondary | ICD-10-CM | POA: Diagnosis not present

## 2016-08-01 DIAGNOSIS — K5090 Crohn's disease, unspecified, without complications: Secondary | ICD-10-CM | POA: Diagnosis present

## 2016-08-01 DIAGNOSIS — Z9889 Other specified postprocedural states: Secondary | ICD-10-CM

## 2016-08-01 DIAGNOSIS — Z9225 Personal history of immunosupression therapy: Secondary | ICD-10-CM

## 2016-08-01 DIAGNOSIS — F79 Unspecified intellectual disabilities: Secondary | ICD-10-CM

## 2016-08-01 HISTORY — PX: COLOSTOMY TAKEDOWN: SHX5258

## 2016-08-01 HISTORY — DX: Unspecified intestinal obstruction, unspecified as to partial versus complete obstruction: K56.609

## 2016-08-01 HISTORY — DX: Acquired absence of other specified parts of digestive tract: Z90.49

## 2016-08-01 LAB — TYPE AND SCREEN
ABO/RH(D): A POS
Antibody Screen: NEGATIVE

## 2016-08-01 LAB — ABO/RH: ABO/RH(D): A POS

## 2016-08-01 SURGERY — CLOSURE, COLOSTOMY, LAPAROSCOPIC
Anesthesia: General | Site: Abdomen

## 2016-08-01 SURGERY — Surgical Case
Anesthesia: *Unknown

## 2016-08-01 MED ORDER — DEXAMETHASONE SODIUM PHOSPHATE 10 MG/ML IJ SOLN
INTRAMUSCULAR | Status: DC | PRN
  Administered 2016-08-01: 10 mg via INTRAVENOUS

## 2016-08-01 MED ORDER — CHLORHEXIDINE GLUCONATE 4 % EX LIQD: 60.0000 mL | Freq: Once | CUTANEOUS | Status: DC

## 2016-08-01 MED ORDER — ROCURONIUM BROMIDE 100 MG/10ML IV SOLN
INTRAVENOUS | Status: DC | PRN
  Administered 2016-08-01: 20 mg via INTRAVENOUS
  Administered 2016-08-01 (×2): 10 mg via INTRAVENOUS
  Administered 2016-08-01: 20 mg via INTRAVENOUS
  Administered 2016-08-01: 10 mg via INTRAVENOUS
  Administered 2016-08-01: 40 mg via INTRAVENOUS
  Administered 2016-08-01: 10 mg via INTRAVENOUS

## 2016-08-01 MED ORDER — HYDROCODONE-ACETAMINOPHEN 5-325 MG PO TABS: 1.0000 | ORAL_TABLET | ORAL | Status: DC | PRN

## 2016-08-01 MED ORDER — EPHEDRINE 5 MG/ML INJ
INTRAVENOUS | Status: AC
Start: 2016-08-01 — End: 2016-08-01
  Filled 2016-08-01: qty 10

## 2016-08-01 MED ORDER — FENTANYL CITRATE (PF) 100 MCG/2ML IJ SOLN
INTRAMUSCULAR | Status: DC | PRN
  Administered 2016-08-01 (×3): 50 ug via INTRAVENOUS
  Administered 2016-08-01: 100 ug via INTRAVENOUS

## 2016-08-01 MED ORDER — DEXAMETHASONE SODIUM PHOSPHATE 10 MG/ML IJ SOLN
INTRAMUSCULAR | Status: AC
  Filled 2016-08-01: qty 1

## 2016-08-01 MED ORDER — FENTANYL CITRATE (PF) 250 MCG/5ML IJ SOLN
INTRAMUSCULAR | Status: AC
  Filled 2016-08-01: qty 5

## 2016-08-01 MED ORDER — LACTATED RINGERS IR SOLN
Status: DC | PRN
  Administered 2016-08-01: 1000 mL

## 2016-08-01 MED ORDER — BUPIVACAINE HCL 0.5 % IJ SOLN
INTRAMUSCULAR | Status: DC | PRN
  Administered 2016-08-01: 30 mL

## 2016-08-01 MED ORDER — HEPARIN SODIUM (PORCINE) 5000 UNIT/ML IJ SOLN
5000.0000 [IU] | Freq: Three times a day (TID) | INTRAMUSCULAR | Status: DC
  Administered 2016-08-01 – 2016-08-08 (×19): 5000 [IU] via SUBCUTANEOUS
  Filled 2016-08-01 (×20): qty 1

## 2016-08-01 MED ORDER — PROMETHAZINE HCL 25 MG/ML IJ SOLN: 6.2500 mg | INTRAMUSCULAR | Status: DC | PRN

## 2016-08-01 MED ORDER — INFLUENZA VAC SPLIT QUAD 0.5 ML IM SUSY
0.5000 mL | PREFILLED_SYRINGE | INTRAMUSCULAR | Status: AC
  Administered 2016-08-02: 0.5 mL via INTRAMUSCULAR
  Filled 2016-08-01: qty 0.5

## 2016-08-01 MED ORDER — ONDANSETRON HCL 4 MG/2ML IJ SOLN
INTRAMUSCULAR | Status: DC | PRN
  Administered 2016-08-01: 4 mg via INTRAVENOUS

## 2016-08-01 MED ORDER — ALVIMOPAN 12 MG PO CAPS
12.0000 mg | ORAL_CAPSULE | Freq: Once | ORAL | Status: AC
  Administered 2016-08-01: 12 mg via ORAL
  Filled 2016-08-01: qty 1

## 2016-08-01 MED ORDER — MIDAZOLAM HCL 2 MG/2ML IJ SOLN
INTRAMUSCULAR | Status: AC
Start: 2016-08-01 — End: 2016-08-01
  Filled 2016-08-01: qty 2

## 2016-08-01 MED ORDER — LIDOCAINE 2% (20 MG/ML) 5 ML SYRINGE
INTRAMUSCULAR | Status: AC
  Filled 2016-08-01: qty 5

## 2016-08-01 MED ORDER — DEXTROSE 5 % IV SOLN
2.0000 g | INTRAVENOUS | Status: AC
  Administered 2016-08-01: 2 g via INTRAVENOUS

## 2016-08-01 MED ORDER — MORPHINE SULFATE (PF) 2 MG/ML IV SOLN
1.0000 mg | INTRAVENOUS | Status: DC | PRN
  Administered 2016-08-01 – 2016-08-03 (×5): 2 mg via INTRAVENOUS
  Filled 2016-08-01 (×5): qty 1

## 2016-08-01 MED ORDER — FENTANYL CITRATE (PF) 100 MCG/2ML IJ SOLN: 25.0000 ug | INTRAMUSCULAR | Status: DC | PRN

## 2016-08-01 MED ORDER — CHLORHEXIDINE GLUCONATE 4 % EX LIQD
60.0000 mL | Freq: Once | CUTANEOUS | Status: DC
Start: 2016-08-01 — End: 2016-08-01

## 2016-08-01 MED ORDER — LACTATED RINGERS IV SOLN: Freq: Once | INTRAVENOUS | Status: DC

## 2016-08-01 MED ORDER — PROPOFOL 10 MG/ML IV BOLUS
INTRAVENOUS | Status: AC
  Filled 2016-08-01: qty 20

## 2016-08-01 MED ORDER — CEFOTETAN DISODIUM-DEXTROSE 2-2.08 GM-% IV SOLR
INTRAVENOUS | Status: AC
  Filled 2016-08-01: qty 50

## 2016-08-01 MED ORDER — ALVIMOPAN 12 MG PO CAPS
12.0000 mg | ORAL_CAPSULE | Freq: Two times a day (BID) | ORAL | Status: DC
  Administered 2016-08-02 – 2016-08-05 (×8): 12 mg via ORAL
  Filled 2016-08-01 (×9): qty 1

## 2016-08-01 MED ORDER — LACTATED RINGERS IV SOLN
INTRAVENOUS | Status: DC | PRN
  Administered 2016-08-01: 07:00:00 via INTRAVENOUS

## 2016-08-01 MED ORDER — SODIUM CHLORIDE 0.9 % IV SOLN
500.0000 mg | Freq: Two times a day (BID) | INTRAVENOUS | Status: DC
  Administered 2016-08-01 – 2016-08-05 (×9): 500 mg via INTRAVENOUS
  Filled 2016-08-01 (×10): qty 5

## 2016-08-01 MED ORDER — BUPIVACAINE HCL (PF) 0.5 % IJ SOLN
INTRAMUSCULAR | Status: AC
  Filled 2016-08-01: qty 30

## 2016-08-01 MED ORDER — ONDANSETRON HCL 4 MG/2ML IJ SOLN
INTRAMUSCULAR | Status: AC
Start: 2016-08-01 — End: 2016-08-01
  Filled 2016-08-01: qty 2

## 2016-08-01 MED ORDER — ROCURONIUM BROMIDE 10 MG/ML (PF) SYRINGE
PREFILLED_SYRINGE | INTRAVENOUS | Status: AC
  Filled 2016-08-01: qty 10

## 2016-08-01 MED ORDER — LIDOCAINE HCL (CARDIAC) 20 MG/ML IV SOLN
INTRAVENOUS | Status: DC | PRN
  Administered 2016-08-01: 60 mg via INTRAVENOUS

## 2016-08-01 MED ORDER — ONDANSETRON HCL 4 MG PO TABS: 4.0000 mg | ORAL_TABLET | Freq: Four times a day (QID) | ORAL | Status: DC | PRN

## 2016-08-01 MED ORDER — 0.9 % SODIUM CHLORIDE (POUR BTL) OPTIME
TOPICAL | Status: DC | PRN
  Administered 2016-08-01: 3000 mL

## 2016-08-01 MED ORDER — ONDANSETRON HCL 4 MG/2ML IJ SOLN: 4.0000 mg | Freq: Four times a day (QID) | INTRAMUSCULAR | Status: DC | PRN

## 2016-08-01 MED ORDER — LACTATED RINGERS IV SOLN
INTRAVENOUS | Status: DC | PRN
  Administered 2016-08-01 (×3): via INTRAVENOUS

## 2016-08-01 MED ORDER — PROPOFOL 10 MG/ML IV BOLUS
INTRAVENOUS | Status: DC | PRN
  Administered 2016-08-01: 200 mg via INTRAVENOUS

## 2016-08-01 MED ORDER — EPHEDRINE SULFATE 50 MG/ML IJ SOLN
INTRAMUSCULAR | Status: DC | PRN
  Administered 2016-08-01: 15 mg via INTRAVENOUS

## 2016-08-01 MED ORDER — SUGAMMADEX SODIUM 200 MG/2ML IV SOLN
INTRAVENOUS | Status: DC | PRN
Start: 2016-08-01 — End: 2016-08-01
  Administered 2016-08-01: 200 mg via INTRAVENOUS

## 2016-08-01 MED ORDER — MIDAZOLAM HCL 5 MG/5ML IJ SOLN
INTRAMUSCULAR | Status: DC | PRN
  Administered 2016-08-01: 2 mg via INTRAVENOUS

## 2016-08-01 MED ORDER — SUCCINYLCHOLINE CHLORIDE 20 MG/ML IJ SOLN
INTRAMUSCULAR | Status: DC | PRN
  Administered 2016-08-01: 160 mg via INTRAVENOUS

## 2016-08-01 MED ORDER — KCL IN DEXTROSE-NACL 20-5-0.45 MEQ/L-%-% IV SOLN
INTRAVENOUS | Status: DC
  Administered 2016-08-01 – 2016-08-05 (×12): via INTRAVENOUS
  Filled 2016-08-01 (×13): qty 1000

## 2016-08-01 SURGICAL SUPPLY — 73 items
ADH SKN CLS APL DERMABOND .7 (GAUZE/BANDAGES/DRESSINGS) ×1
APPLIER CLIP 5 13 M/L LIGAMAX5 (MISCELLANEOUS)
APPLIER CLIP ROT 10 11.4 M/L (STAPLE)
APR CLP MED LRG 11.4X10 (STAPLE)
APR CLP MED LRG 5 ANG JAW (MISCELLANEOUS)
BLADE EXTENDED COATED 6.5IN (ELECTRODE) IMPLANT
CABLE HIGH FREQUENCY MONO STRZ (ELECTRODE) ×2 IMPLANT
CELLS DAT CNTRL 66122 CELL SVR (MISCELLANEOUS) IMPLANT
CLIP APPLIE 5 13 M/L LIGAMAX5 (MISCELLANEOUS) IMPLANT
CLIP APPLIE ROT 10 11.4 M/L (STAPLE) IMPLANT
COUNTER NEEDLE 20 DBL MAG RED (NEEDLE) ×2 IMPLANT
COVER MAYO STAND STRL (DRAPES) ×6 IMPLANT
COVER SURGICAL LIGHT HANDLE (MISCELLANEOUS) IMPLANT
DECANTER SPIKE VIAL GLASS SM (MISCELLANEOUS) ×2 IMPLANT
DERMABOND ADVANCED (GAUZE/BANDAGES/DRESSINGS) ×1
DERMABOND ADVANCED .7 DNX12 (GAUZE/BANDAGES/DRESSINGS) IMPLANT
DISSECTOR BLUNT TIP ENDO 5MM (MISCELLANEOUS) IMPLANT
DRAIN CHANNEL 19F RND (DRAIN) IMPLANT
DRAPE LAPAROSCOPIC ABDOMINAL (DRAPES) ×2 IMPLANT
DRSG OPSITE POSTOP 4X10 (GAUZE/BANDAGES/DRESSINGS) IMPLANT
DRSG OPSITE POSTOP 4X6 (GAUZE/BANDAGES/DRESSINGS) ×1 IMPLANT
DRSG OPSITE POSTOP 4X8 (GAUZE/BANDAGES/DRESSINGS) IMPLANT
ELECT PENCIL ROCKER SW 15FT (MISCELLANEOUS) ×4 IMPLANT
ELECT REM PT RETURN 15FT ADLT (MISCELLANEOUS) ×2 IMPLANT
EVACUATOR SILICONE 100CC (DRAIN) IMPLANT
EXTRT SYSTEM ALEXIS 17CM (MISCELLANEOUS)
GAUZE SPONGE 4X4 12PLY STRL (GAUZE/BANDAGES/DRESSINGS) IMPLANT
GLOVE SURG SIGNA 7.5 PF LTX (GLOVE) ×4 IMPLANT
GOWN STRL REUS W/TWL XL LVL3 (GOWN DISPOSABLE) ×8 IMPLANT
HOLDER FOLEY CATH W/STRAP (MISCELLANEOUS) ×2 IMPLANT
IRRIG SUCT STRYKERFLOW 2 WTIP (MISCELLANEOUS) ×2
IRRIGATION SUCT STRKRFLW 2 WTP (MISCELLANEOUS) ×1 IMPLANT
LEGGING LITHOTOMY PAIR STRL (DRAPES) ×2 IMPLANT
LUBRICANT JELLY K Y 4OZ (MISCELLANEOUS) IMPLANT
NDL HYPO 25X1 1.5 SAFETY (NEEDLE) ×1 IMPLANT
NEEDLE HYPO 25X1 1.5 SAFETY (NEEDLE) ×2 IMPLANT
PACK COLON (CUSTOM PROCEDURE TRAY) ×2 IMPLANT
PAD POSITIONING PINK XL (MISCELLANEOUS) ×2 IMPLANT
PORT LAP GEL ALEXIS MED 5-9CM (MISCELLANEOUS) IMPLANT
POSITIONER SURGICAL ARM (MISCELLANEOUS) IMPLANT
RETRACTOR WND ALEXIS 18 MED (MISCELLANEOUS) IMPLANT
RTRCTR WOUND ALEXIS 18CM MED (MISCELLANEOUS)
SEALER TISSUE X1 CVD JAW (INSTRUMENTS) IMPLANT
SHEARS HARMONIC ACE PLUS 36CM (ENDOMECHANICALS) IMPLANT
SLEEVE ADV FIXATION 5X100MM (TROCAR) ×2 IMPLANT
SPONGE LAP 18X18 X RAY DECT (DISPOSABLE) ×1 IMPLANT
STAPLER VISISTAT 35W (STAPLE) ×2 IMPLANT
SUCTION YANKAUER HANDLE (MISCELLANEOUS) ×2 IMPLANT
SUT ETHILON 3 0 PS 1 (SUTURE) IMPLANT
SUT MNCRL AB 4-0 PS2 18 (SUTURE) ×2 IMPLANT
SUT NOVA 1 T20/GS 25DT (SUTURE) ×1 IMPLANT
SUT NOVA NAB DX-16 0-1 5-0 T12 (SUTURE) IMPLANT
SUT PDS AB 1 CTX 36 (SUTURE) ×5 IMPLANT
SUT PDS AB 1 TP1 96 (SUTURE) IMPLANT
SUT PROLENE 2 0 KS (SUTURE) IMPLANT
SUT PROLENE 2 0 SH DA (SUTURE) IMPLANT
SUT SILK 2 0 (SUTURE) ×2
SUT SILK 2 0 SH CR/8 (SUTURE) ×2 IMPLANT
SUT SILK 2-0 18XBRD TIE 12 (SUTURE) ×1 IMPLANT
SUT SILK 3 0 (SUTURE) ×2
SUT SILK 3 0 SH CR/8 (SUTURE) ×4 IMPLANT
SUT SILK 3-0 18XBRD TIE 12 (SUTURE) ×1 IMPLANT
SUT VIC AB 3-0 SH 18 (SUTURE) IMPLANT
SYSTEM CONTND EXTRCTN KII BLLN (MISCELLANEOUS) IMPLANT
TOWEL OR NON WOVEN STRL DISP B (DISPOSABLE) IMPLANT
TRAY FOLEY W/METER SILVER 16FR (SET/KITS/TRAYS/PACK) IMPLANT
TROCAR ADV FIXATION 11X100MM (TROCAR) IMPLANT
TROCAR ADV FIXATION 12X100MM (TROCAR) IMPLANT
TROCAR ADV FIXATION 5X100MM (TROCAR) ×2 IMPLANT
TROCAR BLADELESS OPT 5 100 (ENDOMECHANICALS) ×2 IMPLANT
TROCAR XCEL BLUNT TIP 100MML (ENDOMECHANICALS) IMPLANT
TUBING INSUF HEATED (TUBING) ×2 IMPLANT
YANKAUER SUCT BULB TIP 10FT TU (MISCELLANEOUS) ×1 IMPLANT

## 2016-08-01 NOTE — Anesthesia Postprocedure Evaluation (Signed)
Anesthesia Post Note  Patient: [name]  Procedure(s) Performed: Procedure(s) (LRB): LAPAROSCOPIC COLOSTOMY REVERSAL (N/A)  Patient location during evaluation: PACU Anesthesia Type: General Level of consciousness: awake and alert Pain management: pain level controlled Vital Signs Assessment: post-procedure vital signs reviewed and stable Respiratory status: spontaneous breathing, nonlabored ventilation, respiratory function stable and patient connected to nasal cannula oxygen Cardiovascular status: blood pressure returned to baseline and stable Postop Assessment: no signs of nausea or vomiting Anesthetic complications: no    Last Vitals:  Vitals:   08/01/16 1230 08/01/16 1245  BP: 130/61 (!) 115/48  Pulse: 78 80  Resp: 13 15  Temp: 36.4 C 36.5 C    Last Pain:  Vitals:   08/01/16 1400  PainSc: 7                  Marguetta Windish J

## 2016-08-01 NOTE — Transfer of Care (Signed)
Immediate Anesthesia Transfer of Care Note  Patient: [name]  Procedure(s) Performed: Procedure(s): LAPAROSCOPIC COLOSTOMY REVERSAL (N/A)  Patient Location: PACU  Anesthesia Type:General  Level of Consciousness: awake, alert  and oriented  Airway & Oxygen Therapy: Patient connected to nasal cannula oxygen  Post-op Assessment: Report given to RN and Post -op Vital signs reviewed and stable  Post vital signs: Reviewed and stable  Last Vitals:  Vitals:   08/01/16 1135  BP: (P) 131/62  Resp: (P) 12  Temp: (P) 36.4 C    Last Pain: There were no vitals filed for this visit.    Patients Stated Pain Goal: 4 (10/93/23 5573)  Complications: No apparent anesthesia complications

## 2016-08-01 NOTE — Anesthesia Procedure Notes (Signed)
Procedure Name: Intubation Date/Time: 08/01/2016 7:28 AM Performed by: Gaston Islam EVETTE Pre-anesthesia Checklist: Patient identified, Suction available, Patient being monitored and Emergency Drugs available Patient Re-evaluated:Patient Re-evaluated prior to inductionOxygen Delivery Method: Circle system utilized Preoxygenation: Pre-oxygenation with 100% oxygen Intubation Type: IV induction Ventilation: Mask ventilation without difficulty Laryngoscope Size: Mac and 4 Grade View: Grade II Tube type: Oral Tube size: 7.5 mm Number of attempts: 1 Airway Equipment and Method: Patient positioned with wedge pillow Placement Confirmation: ETT inserted through vocal cords under direct vision,  positive ETCO2 and breath sounds checked- equal and bilateral Secured at: 23 cm Tube secured with: Tape Dental Injury: Teeth and Oropharynx as per pre-operative assessment

## 2016-08-01 NOTE — Interval H&P Note (Signed)
History and Physical Interval Note:  08/01/2016 7:19 AM  [name]  has presented today for surgery, with the diagnosis of CROHN'S DISEASE  The various methods of treatment have been discussed with the patient and family.  He has completed his bowel prep.  After consideration of risks, benefits and other options for treatment, the patient has consented to  Procedure(s): LAPAROSCOPIC COLOSTOMY REVERSAL (N/A) as a surgical intervention .  The patient's history has been reviewed, patient examined, no change in status, stable for surgery.  I have reviewed the patient's chart and labs.  Questions were answered to the patient's satisfaction.     Blayre Papania H

## 2016-08-01 NOTE — Anesthesia Preprocedure Evaluation (Addendum)
Anesthesia Evaluation  Patient identified by MRN, date of birth, ID band Patient awake    Reviewed: Allergy & Precautions, NPO status , Patient's Chart, lab work & pertinent test results  Airway Mallampati: II  TM Distance: >3 FB Neck ROM: Full    Dental no notable dental hx.    Pulmonary neg pulmonary ROS,    Pulmonary exam normal breath sounds clear to auscultation       Cardiovascular Normal cardiovascular exam+ Valvular Problems/Murmurs AS  Rhythm:Regular Rate:Normal  ECHO 01-18-16: Study Conclusions  - Left ventricle: The cavity size was mildly dilated. Systolic   function was normal. The estimated ejection fraction was in the   range of 50% to 55%. Features are consistent with a pseudonormal   left ventricular filling pattern, with concomitant abnormal   relaxation and increased filling pressure (grade 2 diastolic   dysfunction). Doppler parameters are consistent with elevated   ventricular end-diastolic filling pressure. - Aortic valve: There was very mild stenosis. There was moderate   regurgitation. Peak velocity (S): 228 cm/s. Mean gradient (S): 11   mm Hg. Regurgitation pressure half-time: 393 ms. - Mitral valve: There was no evidence for stenosis. There was no   regurgitation. - Right ventricle: The cavity size was normal. Wall thickness was   normal. Systolic function was normal.   Neuro/Psych negative neurological ROS  negative psych ROS   GI/Hepatic negative GI ROS, Neg liver ROS,   Endo/Other  negative endocrine ROS  Renal/GU Renal disease  negative genitourinary   Musculoskeletal negative musculoskeletal ROS (+)   Abdominal   Peds negative pediatric ROS (+)  Hematology negative hematology ROS (+) anemia ,   Anesthesia Other Findings   Reproductive/Obstetrics negative OB ROS                            Anesthesia Physical Anesthesia Plan  ASA: II  Anesthesia Plan:  General   Post-op Pain Management:    Induction: Intravenous  Airway Management Planned: Oral ETT  Additional Equipment:   Intra-op Plan:   Post-operative Plan: Extubation in OR  Informed Consent: I have reviewed the patients History and Physical, chart, labs and discussed the procedure including the risks, benefits and alternatives for the proposed anesthesia with the patient or authorized representative who has indicated his/her understanding and acceptance.   Dental advisory given  Plan Discussed with: CRNA  Anesthesia Plan Comments:         Anesthesia Quick Evaluation

## 2016-08-02 LAB — BASIC METABOLIC PANEL
ANION GAP: 7 (ref 5–15)
BUN: 10 mg/dL (ref 6–20)
CHLORIDE: 102 mmol/L (ref 101–111)
CO2: 22 mmol/L (ref 22–32)
Calcium: 7.9 mg/dL — ABNORMAL LOW (ref 8.9–10.3)
Creatinine, Ser: 1 mg/dL (ref 0.61–1.24)
Glucose, Bld: 117 mg/dL — ABNORMAL HIGH (ref 65–99)
POTASSIUM: 4.9 mmol/L (ref 3.5–5.1)
SODIUM: 131 mmol/L — AB (ref 135–145)

## 2016-08-02 LAB — CBC
HEMATOCRIT: 38.5 % — AB (ref 39.0–52.0)
HEMOGLOBIN: 13 g/dL (ref 13.0–17.0)
MCH: 32.4 pg (ref 26.0–34.0)
MCHC: 33.8 g/dL (ref 30.0–36.0)
MCV: 96 fL (ref 78.0–100.0)
Platelets: 248 10*3/uL (ref 150–400)
RBC: 4.01 MIL/uL — AB (ref 4.22–5.81)
RDW: 14.3 % (ref 11.5–15.5)
WBC: 13.4 10*3/uL — AB (ref 4.0–10.5)

## 2016-08-02 MED ORDER — SODIUM CHLORIDE 0.9% FLUSH
10.0000 mL | INTRAVENOUS | Status: DC | PRN
  Administered 2016-08-03: 10 mL
  Filled 2016-08-02: qty 40

## 2016-08-02 NOTE — Progress Notes (Signed)
[facility] Office:  [phone] General Surgery Progress Note   LOS: 1 day  POD -  1 Day Post-Op  Assessment/Plan: 1.  LAPAROSCOPIC COLOSTOMY REVERSAL - 08/01/2016 - D. Latorria Zeoli  For end colostomy  Looks okay - on Entereg  NPO except ice chips  2.  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 Richy Spradley             Followed by Dr. Lenna Sciara. Pyrtle  3. Seizure disorder  Idiopathic 4. Learning challenged.             He has moved to [facility] in [place] 5.  DVT prophylaxis - SQ heparin 6.  To get foley out   Active Problems:   S/P colostomy takedown   Subjective:  Doing okay.  No complaints  Objective:   Vitals:   08/01/16 2343 08/02/16 0404  BP: (!) 94/41 (!) 100/45  Pulse: 78   Resp: 16 12  Temp: 98.4 F (36.9 C) 100 F (37.8 C)     Intake/Output from previous day:  10/10 0701 - 10/11 0700 In: 4302.9 [I.V.:4197.9; IV Piggyback:105] Out: 9753 [Urine:1475; Blood:200]  Intake/Output this shift:  Total I/O In: 1010.4 [I.V.:1010.4] Out: -    Physical Exam:   General: WN older WM who is alert and oriented.    HEENT: Normal. Pupils equal. .   Lungs: Clear.  IS = 1,000cc (he needs some coaching to do this)   Abdomen: Soft, Quiet.   Wound: Okay.  Dressings intact.   Lab Results:    Recent Labs  08/02/16 0547  WBC 13.4*  HGB 13.0  HCT 38.5*  PLT 248    BMET   Recent Labs  08/02/16 0547  NA 131*  K 4.9  CL 102  CO2 22  GLUCOSE 117*  BUN 10  CREATININE 1.00  CALCIUM 7.9*    PT/INR  No results for input(s): LABPROT, INR in the last 72 hours.  ABG  No results for input(s): PHART, HCO3 in the last 72 hours.  Invalid input(s): PCO2,  PO2   Studies/Results:  No results found.   Anti-infectives:   Anti-infectives    Start     Dose/Rate Route Frequency Ordered Stop   08/01/16 0530  cefoTEtan (CEFOTAN) 2 g in dextrose 5 % 50 mL IVPB     2 g 100 mL/hr over 30 Minutes Intravenous On call to O.R. 08/01/16 0530 08/01/16 0725      Alphonsa Overall, MD, FACS Pager: [facility] Office: [phone] 08/02/2016

## 2016-08-02 NOTE — Op Note (Signed)
NAMEMCCABE, [name] NO.:  [id number]  MEDICAL RECORD NO.:  [id number]  LOCATION:  1324                         FACILITY:  [facility]  PHYSICIAN:  Fenton Malling. Lucia Gaskins, M.D.  DATE OF BIRTH:  1956/09/16  DATE OF PROCEDURE:  08/01/2016                               OPERATIVE REPORT   PREOPERATIVE DIAGNOSIS:  End colostomy with history of Crohn disease.  POSTOPERATIVE DIAGNOSIS:  End colostomy with history of Crohn disease.  PROCEDURE:  Laparoscopic-assisted colostomy closure.  SURGEON:  Fenton Malling. Lucia Gaskins, M.D..  FIRST ASSISTANT:  Dr. Cottie Banda.  ANESTHESIA:  General endotracheal supervised by Dr. Franne Grip.  ESTIMATED BLOOD LOSS:  100 mL.  DRAINS LEFT IN:  None.  LOCAL USE:  A 30 mL of 0.25% Marcaine.  SPECIMEN:  Distal colostomy in the proximal sigmoid colon.  COUNTS:  Correct.  INDICATION FOR PROCEDURE:  [name] is a 60 year old white male who sees Dr. Celedonio Miyamoto, as his primary care doctor.  He has seen Dr. Zenovia Jarred for Crohn disease, who comes for reversal of colostomy.  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  He underwent a left colon resection with end colostomy and resection of small bowel on December 04, 2015.  The final pathology was benign, so he is now well over 6 months from his original surgery.  He has gone through preop evaluation, in which I did a barium enema through the colostomy and rectum that showed a stricture at his ileo-to-right colon anastomosis, but no other abnormality.  He actually had a generous amount of sigmoid colon remaining from his original resection. He also underwent last week a colonoscopy by Dr. Zenovia Jarred, who saw no active Crohn's in the colon.  [name] was able to visualize the stricture, so even though there is a stricture, it does not appear to be clinically important at this time and I will leave this alone during the  surgery.  OPERATIVE NOTE:  The patient was placed in the lithotomy position with a Foley catheter in place, both his arms tucked as he had tape across his chest.  His abdomen was prepped with ChloraPrep and Betadine.  I sewed the ostomy close.  He was given 2 g of cefotetan at the beginning of the procedure.  A time-out was held and surgical checklist run.  I accessed the abdominal cavity through right upper quadrant with 5 mm Optiview.  I placed a second 5 mm trocar in right mid abdomen and third 5 mm trocar in right lower abdomen.  Upon abdominal exploration, he had some loose adhesions of small bowel to the anterior abdominal wall which were taken down.  He had a fairly dense adhesions of the small bowel to the left colonic gutter.  This was seen to be right against the mesentery, whether this is fat necrosis or some old abscess was unclear. There was no obvious purulence though there were fairly dense inflammatory changes.  I took this down and later evaluated the small bowel when the abdomen was opened.  The small bowel looked okay.  I was able to find  the distal Hartmann's pouch which had Prolene sutures in it.  I was able to mobilize the mesentery of the sigmoid colon, so he still had almost all the sigmoid colon.  I took down the colostomy, both laparoscopically and then in an open fashion.  After getting the colostomy down, I then made a midline incision. Through his old incision excised, was still a small area of granulation tissue, which did not totally heal.  I got back to normal skin.  I went through a fairly thick abdominal wall scar into the abdominal cavity.  I was able to mobilize the sigmoid colon to this end colostomy/transverse colon and did a primary hand-sewn anastomosis.  I did an end-to-end hand-sewn 2-0 silk anastomosis with inverting sutures and Gambee sutures on the anterior wall.  This provided an opening of at least 2 cm in size.  The bowel looked viable and healthy with no  evidence of Crohn disease or other inflammatory component.    I then returned the colon to the abdominal cavity.  I closed the left upper quadrant ostomy with 2 layer running PDS suture.  I closed the midline incision with interrupted inverting #1 Novafil suture.  I then re-laparoscoped the patient.  I was able to reduce all the small bowel anteriorly so that this anastomosis fell posterior to the small bowel. I irrigated the abdomen with 2 L of saline.  I was able to visualize the small bowel beat up and again showed some exterior inflammatory changes, but no obvious perforation or injury to the small bowel.  Both lobes of the liver and stomach were unremarkable.  There was no other abnormality.  I did not try to visualize his ileocolonic anastomosis.  I then removed the 5 mm trocars in turn.  I closed the midline and colostomy incision with a staple gun.  I placed Telfa wicks in this because there was some dense space as we were about collecting fluid and I closed the 3 trocar stab wounds with 4-0 Monocryl suture, painted this with Dermabond.  The patient tolerated the procedure well, was transferred to the recovery room in good condition.  Sponge and needle count were correct at the end of the case.   Fenton Malling. Lucia Gaskins, M.D., FACS   DHN/MEDQ  D:  08/01/2016  T:  08/02/2016  Job:  [id number]  cc:   Zenovia Jarred, MD Fax: [phone]  Celedonio Miyamoto, MD Fax: [phone]

## 2016-08-02 NOTE — Progress Notes (Signed)
Peripherally Inserted Central Catheter/Midline Placement  The IV Nurse has discussed with the patient and/or persons authorized to consent for the patient, the purpose of this procedure and the potential benefits and risks involved with this procedure.  The benefits include less needle sticks, lab draws from the catheter, and the patient may be discharged home with the catheter. Risks include, but not limited to, infection, bleeding, blood clot (thrombus formation), and puncture of an artery; nerve damage and irregular heartbeat and possibility to perform a PICC exchange if needed/ordered by physician.  Alternatives to this procedure were also discussed.  Bard Power PICC patient education guide, fact sheet on infection prevention and patient information card has been provided to patient /or left at bedside.    PICC/Midline Placement Documentation        [name] 08/02/2016, 4:14 PM

## 2016-08-03 LAB — CBC WITH DIFFERENTIAL/PLATELET
BASOS ABS: 0 10*3/uL (ref 0.0–0.1)
BASOS PCT: 0 %
Eosinophils Absolute: 0 10*3/uL (ref 0.0–0.7)
Eosinophils Relative: 0 %
HEMATOCRIT: 35.6 % — AB (ref 39.0–52.0)
Hemoglobin: 12.3 g/dL — ABNORMAL LOW (ref 13.0–17.0)
LYMPHS PCT: 3 %
Lymphs Abs: 0.3 10*3/uL — ABNORMAL LOW (ref 0.7–4.0)
MCH: 32.4 pg (ref 26.0–34.0)
MCHC: 34.6 g/dL (ref 30.0–36.0)
MCV: 93.7 fL (ref 78.0–100.0)
MONO ABS: 1.2 10*3/uL — AB (ref 0.1–1.0)
Monocytes Relative: 11 %
NEUTROS ABS: 9.8 10*3/uL — AB (ref 1.7–7.7)
Neutrophils Relative %: 86 %
PLATELETS: 236 10*3/uL (ref 150–400)
RBC: 3.8 MIL/uL — AB (ref 4.22–5.81)
RDW: 14.4 % (ref 11.5–15.5)
WBC: 11.3 10*3/uL — AB (ref 4.0–10.5)

## 2016-08-03 LAB — BASIC METABOLIC PANEL
ANION GAP: 5 (ref 5–15)
BUN: 7 mg/dL (ref 6–20)
CALCIUM: 8 mg/dL — AB (ref 8.9–10.3)
CO2: 24 mmol/L (ref 22–32)
Chloride: 106 mmol/L (ref 101–111)
Creatinine, Ser: 0.92 mg/dL (ref 0.61–1.24)
GLUCOSE: 134 mg/dL — AB (ref 65–99)
POTASSIUM: 4.1 mmol/L (ref 3.5–5.1)
SODIUM: 135 mmol/L (ref 135–145)

## 2016-08-03 MED ORDER — SODIUM CHLORIDE 0.9 % IV SOLN
INTRAVENOUS | Status: DC
  Administered 2016-08-03: 08:00:00 via INTRAVENOUS

## 2016-08-03 NOTE — Progress Notes (Addendum)
[facility] Office:  [phone] General Surgery Progress Note   LOS: 2 days  POD -  2 Days Post-Op  Assessment/Plan: 1.  LAPAROSCOPIC COLOSTOMY REVERSAL - 08/01/2016 - D. Misk Galentine  For end colostomy  Looks okay - on Entereg  NPO except ice chips.  No flatus  2.  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 Dakwon Wenberg             Followed by Dr. Lenna Sciara. Pyrtle  3. Seizure disorder  Idiopathic 4. Learning challenged.             He has moved to [facility] in [place] 5.  DVT prophylaxis - SQ heparin 6.  Has tolerated foley out. 7.  Required PICC line for IV access   Active Problems:   S/P colostomy takedown   Subjective:  Doing okay.  No complaints.  Has voided.  No BM.  Sister in room.  Objective:   Vitals:   08/02/16 2112 08/03/16 0618  BP: (!) 123/49 (!) 129/57  Pulse: 91 91  Resp: 16 16  Temp: 98.4 F (36.9 C) 98.8 F (37.1 C)     Intake/Output from previous day:  10/11 0701 - 10/12 0700 In: 4090 [I.V.:3885; IV Piggyback:205] Out: 700 [Urine:700]  Intake/Output this shift:  No intake/output data recorded.   Physical Exam:   General: WN older WM who is alert and oriented.    HEENT: Normal. Pupils equal. .   Lungs: Clear.  IS = 1,300cc, he did much better with this   Heart:  3/6 systolic murmur   Abdomen: Soft, Quiet.   Wound: Okay.  Dressings intact.   Lab Results:     Recent Labs  08/02/16 0547 08/03/16 0400  WBC 13.4* 11.3*  HGB 13.0 12.3*  HCT 38.5* 35.6*  PLT 248 236    BMET    Recent Labs  08/02/16 0547 08/03/16 0400  NA 131* 135  K 4.9 4.1  CL 102 106  CO2 22 24  GLUCOSE 117* 134*  BUN 10 7  CREATININE 1.00 0.92  CALCIUM 7.9*  8.0*    PT/INR  No results for input(s): LABPROT, INR in the last 72 hours.  ABG  No results for input(s): PHART, HCO3 in the last 72 hours.  Invalid input(s): PCO2, PO2   Studies/Results:  No results found.   Anti-infectives:   Anti-infectives    Start     Dose/Rate Route Frequency Ordered Stop   08/01/16 0530  cefoTEtan (CEFOTAN) 2 g in dextrose 5 % 50 mL IVPB     2 g 100 mL/hr over 30 Minutes Intravenous On call to O.R. 08/01/16 0530 08/01/16 0725      Alphonsa Overall, MD, FACS Pager: [facility] Office: [phone] 08/03/2016

## 2016-08-04 LAB — CBC WITH DIFFERENTIAL/PLATELET
BASOS ABS: 0 10*3/uL (ref 0.0–0.1)
Basophils Relative: 0 %
EOS ABS: 0 10*3/uL (ref 0.0–0.7)
EOS PCT: 0 %
HCT: 34.6 % — ABNORMAL LOW (ref 39.0–52.0)
HEMOGLOBIN: 11.3 g/dL — AB (ref 13.0–17.0)
LYMPHS PCT: 4 %
Lymphs Abs: 0.4 10*3/uL — ABNORMAL LOW (ref 0.7–4.0)
MCH: 31.9 pg (ref 26.0–34.0)
MCHC: 32.7 g/dL (ref 30.0–36.0)
MCV: 97.7 fL (ref 78.0–100.0)
Monocytes Absolute: 1.1 10*3/uL — ABNORMAL HIGH (ref 0.1–1.0)
Monocytes Relative: 12 %
NEUTROS PCT: 84 %
Neutro Abs: 7.7 10*3/uL (ref 1.7–7.7)
PLATELETS: 215 10*3/uL (ref 150–400)
RBC: 3.54 MIL/uL — AB (ref 4.22–5.81)
RDW: 14.6 % (ref 11.5–15.5)
WBC: 9.2 10*3/uL (ref 4.0–10.5)

## 2016-08-04 LAB — BASIC METABOLIC PANEL
Anion gap: 4 — ABNORMAL LOW (ref 5–15)
BUN: 5 mg/dL — AB (ref 6–20)
CO2: 24 mmol/L (ref 22–32)
Calcium: 7.9 mg/dL — ABNORMAL LOW (ref 8.9–10.3)
Chloride: 110 mmol/L (ref 101–111)
Creatinine, Ser: 0.83 mg/dL (ref 0.61–1.24)
Glucose, Bld: 109 mg/dL — ABNORMAL HIGH (ref 65–99)
POTASSIUM: 3.9 mmol/L (ref 3.5–5.1)
SODIUM: 138 mmol/L (ref 135–145)

## 2016-08-04 NOTE — Clinical Social Work Note (Signed)
Clinical Social Work Assessment  Patient Details  Name: [name] MRN: [id number] Date of Birth: November 05, 1955  Date of referral:  08/04/16               Reason for consult:  Facility Placement                Permission sought to share information with:  Facility Sport and exercise psychologist, Family Supports Permission granted to share information::     Name::        Agency::    [facility]  Relationship::  Sister    Contact Information:  [name]- [phone]  Housing/Transportation Living arrangements for the past 2 months:  [place] of Information:  Patient Patient Interpreter Needed:  None Criminal Activity/Legal Involvement Pertinent to Current Situation/Hospitalization:  No - Comment as needed Significant Relationships:  Other Family Members Lives with:  Facility Resident, Parents Do you feel safe going back to the place where you live?  Yes Need for family participation in patient care:  Yes (Comment)  Care giving concerns:  Patient reports no concerns at this time. The patient plans to return to [facility]. Family understands patient may need rehab before returning.    Social Worker assessment / plan:  LCSWA met with patient at bedside and explained reason for consult. Patient understands he will be returning to ALF. Patient may require SNF after physical therapy. The family is waiting on doctor recommendations after surgery is complete. CSW will continue to follow to assist with disposition.   Employment status:    Insurance information:  Other (Comment Required) Retail buyer) PT Recommendations:  Not assessed at this time Information / Referral to community resources:     Patient/Family's Response to care:  Agreeable to SNF.  Patient/Family's Understanding of and Emotional Response to Diagnosis, Current Treatment, and Prognosis: " Waiting on physician recommendations. "  Emotional  Assessment Appearance:  Appears stated age Attitude/Demeanor/Rapport:    Affect (typically observed):  Accepting, Pleasant Orientation:  Oriented to Self, Oriented to Place, Oriented to Situation, Oriented to  Time Alcohol / Substance use:    Psych involvement (Current and /or in the community):  No (Comment)  Discharge Needs  Concerns to be addressed:  Discharge Planning Concerns, Care Coordination Readmission within the last 30 days:  Yes Current discharge risk:  None Barriers to Discharge:  No Barriers Identified   Lia Hopping, LCSW 08/04/2016, 11:11 AM

## 2016-08-04 NOTE — Progress Notes (Signed)
[facility]:  [phone] General Surgery Progress Note   LOS: 3 days  POD -  3 Days Post-Op  Assessment/Plan: 1.  LAPAROSCOPIC assisted COLOSTOMY REVERSAL - 08/01/2016 - D. Urian Martenson  For end colostomy  Looks okay - on Entereg  I removed wicks from the wound - will do daily dressing changes  He says that he is passing flatus  To start sips from floor - but will go slow with diet  2.  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 Nupur Hohman             Followed by Dr. Lenna Sciara. Pyrtle  3. Seizure disorder  Idiopathic 4. Learning challenged.             He has moved to [facility] in [place] 5.  DVT prophylaxis - SQ heparin 6.  Has tolerated foley out. 7.  Required PICC line for IV access   Active Problems:   S/P colostomy takedown   Subjective:  Doing okay.  No complaints.  Has voided.  Passed flatus, but no BM  Sister in room.  Objective:   Vitals:   08/03/16 2116 08/04/16 0424  BP: (!) 121/59 128/61  Pulse: 93 94  Resp: 16 18  Temp: 98.8 F (37.1 C) 98.3 F (36.8 C)     Intake/Output from previous day:  10/12 0701 - 10/13 0700 In: 2735.3 [I.V.:2630.3; IV Piggyback:105] Out: -   Intake/Output this shift:  No intake/output data recorded.   Physical Exam:   General: WN older WM who is alert and oriented.    HEENT: Normal. Pupils equal. .   Lungs: Clear.  IS = 1,300cc, he did much better with this   Heart:  3/6 systolic murmur   Abdomen: Soft, Rare BS.   Wound: Okay.  Wicks removed and redressed.   Lab Results:     Recent Labs  08/03/16 0400 08/04/16 0407  WBC 11.3* 9.2  HGB 12.3* 11.3*  HCT 35.6* 34.6*  PLT 236 215    BMET    Recent Labs  08/03/16 0400 08/04/16 0407  NA 135 138  K 4.1 3.9  CL 106 110  CO2 24 24  GLUCOSE 134* 109*  BUN 7 5*  CREATININE 0.92 0.83  CALCIUM 8.0* 7.9*    PT/INR  No results for input(s): LABPROT, INR in the last 72 hours.  ABG  No results for input(s): PHART, HCO3 in the last 72 hours.  Invalid input(s): PCO2, PO2   Studies/Results:  No results found.   Anti-infectives:   Anti-infectives    Start     Dose/Rate Route Frequency Ordered Stop   08/01/16 0530  cefoTEtan (CEFOTAN) 2 g in dextrose 5 % 50 mL IVPB     2 g 100 mL/hr over 30 Minutes Intravenous On call to O.R. 08/01/16 0530 08/01/16 0725      Alphonsa Overall, MD, FACS Pager: [facility]: [phone] 08/04/2016

## 2016-08-05 NOTE — Progress Notes (Signed)
4 Days Post-Op  Subjective: No complaints. Starting to have bm's  Objective: Vital signs in last 24 hours: Temp:  [97.5 F (36.4 C)-98.6 F (37 C)] 98.6 F (37 C) (10/14 0634) Pulse Rate:  [82-101] 83 (10/14 0634) Resp:  [18-19] 19 (10/14 0634) BP: (113-154)/(57-74) 140/58 (10/14 0634) SpO2:  [96 %-99 %] 96 % (10/14 0634) Last BM Date: 08/04/16  Intake/Output from previous day: No intake/output data recorded. Intake/Output this shift: No intake/output data recorded.  Resp: clear to auscultation bilaterally Cardio: regular rate and rhythm GI: soft, minimal tenderness. having bm's. wound ok  Lab Results:   Recent Labs  08/03/16 0400 08/04/16 0407  WBC 11.3* 9.2  HGB 12.3* 11.3*  HCT 35.6* 34.6*  PLT 236 215   BMET  Recent Labs  08/03/16 0400 08/04/16 0407  NA 135 138  K 4.1 3.9  CL 106 110  CO2 24 24  GLUCOSE 134* 109*  BUN 7 5*  CREATININE 0.92 0.83  CALCIUM 8.0* 7.9*   PT/INR No results for input(s): LABPROT, INR in the last 72 hours. ABG No results for input(s): PHART, HCO3 in the last 72 hours.  Invalid input(s): PCO2, PO2  Studies/Results: No results found.  Anti-infectives: Anti-infectives    Start     Dose/Rate Route Frequency Ordered Stop   08/01/16 0530  cefoTEtan (CEFOTAN) 2 g in dextrose 5 % 50 mL IVPB     2 g 100 mL/hr over 30 Minutes Intravenous On call to O.R. 08/01/16 0530 08/01/16 0725      Assessment/Plan: s/p Procedure(s): LAPAROSCOPIC COLOSTOMY REVERSAL (N/A) Advance diet. Start fulls today. Continue entereg Ambulate Continue dressing changes  LOS: 4 days    TOTH III,Ramzi Brathwaite S 08/05/2016

## 2016-08-06 ENCOUNTER — Encounter (HOSPITAL_COMMUNITY): Payer: Self-pay | Admitting: Surgery

## 2016-08-06 DIAGNOSIS — F79 Unspecified intellectual disabilities: Secondary | ICD-10-CM

## 2016-08-06 MED ORDER — LEVETIRACETAM 500 MG PO TABS
500.0000 mg | ORAL_TABLET | Freq: Two times a day (BID) | ORAL | Status: DC
  Administered 2016-08-06 – 2016-08-09 (×7): 500 mg via ORAL
  Filled 2016-08-06 (×7): qty 1

## 2016-08-06 MED ORDER — TRAMADOL HCL 50 MG PO TABS
50.0000 mg | ORAL_TABLET | Freq: Four times a day (QID) | ORAL | Status: DC | PRN
  Administered 2016-08-06: 50 mg via ORAL
  Filled 2016-08-06: qty 1

## 2016-08-06 MED ORDER — PRIMIDONE 250 MG PO TABS
500.0000 mg | ORAL_TABLET | Freq: Two times a day (BID) | ORAL | Status: DC
  Administered 2016-08-06 – 2016-08-09 (×7): 500 mg via ORAL
  Filled 2016-08-06 (×7): qty 2

## 2016-08-06 MED ORDER — GUAIFENESIN 100 MG/5ML PO SOLN: 200.0000 mg | Freq: Four times a day (QID) | ORAL | Status: DC | PRN

## 2016-08-06 MED ORDER — FUROSEMIDE 20 MG PO TABS
20.0000 mg | ORAL_TABLET | Freq: Two times a day (BID) | ORAL | Status: DC
  Administered 2016-08-06 – 2016-08-09 (×7): 20 mg via ORAL
  Filled 2016-08-06 (×7): qty 1

## 2016-08-06 MED ORDER — ADULT MULTIVITAMIN W/MINERALS CH
ORAL_TABLET | Freq: Every day | ORAL | Status: DC
  Administered 2016-08-06 – 2016-08-09 (×4): 1 via ORAL
  Filled 2016-08-06 (×4): qty 1

## 2016-08-06 MED ORDER — VITAMIN D (ERGOCALCIFEROL) 1.25 MG (50000 UNIT) PO CAPS
50000.0000 [IU] | ORAL_CAPSULE | ORAL | Status: DC
  Administered 2016-08-07: 50000 [IU] via ORAL
  Filled 2016-08-06: qty 1

## 2016-08-06 MED ORDER — ACETAMINOPHEN 325 MG PO TABS
650.0000 mg | ORAL_TABLET | Freq: Four times a day (QID) | ORAL | Status: DC | PRN
  Administered 2016-08-06: 650 mg via ORAL
  Filled 2016-08-06: qty 2

## 2016-08-06 MED ORDER — SODIUM CHLORIDE 0.9% FLUSH
3.0000 mL | INTRAVENOUS | Status: DC | PRN
  Administered 2016-08-08: 3 mL via INTRAVENOUS
  Filled 2016-08-06: qty 3

## 2016-08-06 MED ORDER — SODIUM CHLORIDE 0.9% FLUSH
3.0000 mL | Freq: Two times a day (BID) | INTRAVENOUS | Status: DC
  Administered 2016-08-07 – 2016-08-09 (×3): 3 mL via INTRAVENOUS

## 2016-08-06 MED ORDER — POTASSIUM CHLORIDE 20 MEQ/15ML (10%) PO SOLN
20.0000 meq | Freq: Every day | ORAL | Status: DC
  Administered 2016-08-06 – 2016-08-09 (×4): 20 meq via ORAL
  Filled 2016-08-06 (×4): qty 15

## 2016-08-06 MED ORDER — LACTATED RINGERS IV BOLUS (SEPSIS): 1000.0000 mL | Freq: Three times a day (TID) | INTRAVENOUS | Status: AC | PRN

## 2016-08-06 MED ORDER — ONDANSETRON HCL 4 MG PO TABS: 4.0000 mg | ORAL_TABLET | Freq: Four times a day (QID) | ORAL | Status: DC | PRN

## 2016-08-06 MED ORDER — SODIUM CHLORIDE 0.9 % IV SOLN: 250.0000 mL | INTRAVENOUS | Status: DC | PRN

## 2016-08-06 MED ORDER — LORATADINE 10 MG PO TABS
10.0000 mg | ORAL_TABLET | Freq: Every day | ORAL | Status: DC
  Administered 2016-08-06 – 2016-08-09 (×4): 10 mg via ORAL
  Filled 2016-08-06 (×4): qty 1

## 2016-08-06 MED ORDER — METHOCARBAMOL 1000 MG/10ML IJ SOLN
1000.0000 mg | Freq: Four times a day (QID) | INTRAVENOUS | Status: DC | PRN
  Filled 2016-08-06: qty 10

## 2016-08-06 MED ORDER — METHOCARBAMOL 500 MG PO TABS: 1000.0000 mg | ORAL_TABLET | Freq: Four times a day (QID) | ORAL | Status: DC | PRN

## 2016-08-06 NOTE — Progress Notes (Addendum)
[facility]:  [phone] General Surgery Progress Note   LOS: 5 days  POD -  5 Days Post-Op  Assessment/Plan: 1.  LAPAROSCOPIC assisted COLOSTOMY REVERSAL - 08/01/2016 - D. Newman  Ileus resolving Adv to solids  Switch to PO pain control  2.  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 Newman             Followed by Dr. Lenna Sciara. Pyrtle  3. Seizure disorder  Idiopathic - change Keppra to PO 4. Learning challenged.             He has moved to [facility] in [place] 5.  DVT prophylaxis - SQ heparin 6.  Has tolerated foley out. 7.  Required PICC line for IV access 8.  Dispo:  prob SNF vs return to ALF - PT/OT/CSW evals  D/C patient from hospital when patient meets criteria (anticipate in 1-2 day(s)):  Tolerating oral intake well Ambulating well Adequate pain control without IV medications Urinating  Having flatus Disposition planning in place    Active Problems:   Crohn's disease ([place])   Personal history of immunosupression therapy   Localization-related idiopathic epilepsy and epileptic syndromes with seizures of localized onset, not intractable, without status epilepticus ([place])   S/P colostomy takedown   Subjective:   Tol fulls Having BMS Pain controlled In good spirits  Objective:   Vitals:   08/05/16 2117 08/06/16 0547  BP: (!) 145/61 (!) 129/56  Pulse: 80 81  Resp: 19 19  Temp: 98.4 F (36.9 C) 98.2 F (36.8 C)     Intake/Output from previous day:  10/14 0701 - 10/15 0700 In: 720 [P.O.:720] Out: -   Intake/Output this shift:  No intake/output data recorded.   Physical Exam:   General: WN older WM who is alert and oriented.    HEENT:  Normal. Pupils equal. .   Lungs: Clear.     Heart:  3/6 systolic murmur   Abdomen: obese soft.  Incisions c/d/i.  No cellulitis      Lab Results:     Recent Labs  08/04/16 0407  WBC 9.2  HGB 11.3*  HCT 34.6*  PLT 215    BMET    Recent Labs  08/04/16 0407  NA 138  K 3.9  CL 110  CO2 24  GLUCOSE 109*  BUN 5*  CREATININE 0.83  CALCIUM 7.9*    PT/INR  No results for input(s): LABPROT, INR in the last 72 hours.  ABG  No results for input(s): PHART, HCO3 in the last 72 hours.  Invalid input(s): PCO2, PO2   Studies/Results:  No results found.   Anti-infectives:   Anti-infectives    Start     Dose/Rate Route Frequency Ordered Stop   08/01/16 0530  cefoTEtan (CEFOTAN) 2 g in dextrose 5 % 50 mL IVPB     2 g 100 mL/hr over 30 Minutes Intravenous On call to O.R. 08/01/16 0530 08/01/16 0725      Adin Hector, M.D., F.A.C.S. Gastrointestinal and Minimally Invasive Surgery [facility] [address] [place], [place] [address] [phone] Main / Paging  08/06/2016

## 2016-08-06 NOTE — Progress Notes (Signed)
Pharmacy Brief Note - Alvimopan (Entereg)  The standing order set for alvimopan (Entereg) now includes an automatic order to discontinue the drug after the patient has had a bowel movement. The change was approved by the [place] and the Medical Executive Committee.   This patient has had bowel movements documented by nursing. Therefore, alvimopan has been discontinued. If there are questions, please contact the pharmacy at [phone].   Thank you-  Minda Ditto PharmD Pager [phone] 08/06/2016, 1:55 PM

## 2016-08-07 LAB — C DIFFICILE QUICK SCREEN W PCR REFLEX
C Diff antigen: NEGATIVE
C Diff interpretation: NOT DETECTED
C Diff toxin: NEGATIVE

## 2016-08-07 NOTE — NC FL2 (Signed)
[place] LEVEL OF CARE SCREENING TOOL     IDENTIFICATION  Patient Name: [name] Birthdate: 22-Feb-1956 Sex: male Admission Date (Current Location): 08/01/2016  [facility] and [place] Number:  Herbalist and Address:  [facility],  [place] [address] [place]      Provider Number: [id number]  Attending Physician Name and Address:  Alphonsa Overall, MD  Relative Name and Phone Number:       Current Level of Care: Hospital Recommended Level of Care: [place] Prior Approval Number:    Date Approved/Denied:   PASRR Number:    Discharge Plan: SNF    Current Diagnoses: Patient Active Problem List   Diagnosis Date Noted  . Mental disability   . S/P colostomy takedown 08/01/2016  . 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 Localization-related idiopathic epilepsy and epileptic syndromes with seizures of localized onset, not intractable, without status epilepticus ([place]) 03/30/2015  . Aortic valve stenosis, mild 03/31/2014  . Aortic valve regurgitation 03/31/2014  . Pseudopolyposis of colon ([place]) 01/03/2012  . Crohn's disease ([place]) 12/19/2011  . Personal history of immunosupression therapy 12/19/2011  . ECZEMA 08/21/2008  . CHOLELITHIASIS, ASYMPTOMATIC 01/29/2008  . VITAMIN B12 DEFICIENCY 01/17/2008    Orientation RESPIRATION BLADDER Height & Weight     Self, Time, Situation, Place  Normal Incontinent Weight: 204 lb (92.5 kg) Height:  5' 10"  (177.8 cm)  BEHAVIORAL SYMPTOMS/MOOD NEUROLOGICAL BOWEL NUTRITION STATUS    Convulsions/Seizures   Diet  AMBULATORY STATUS COMMUNICATION OF NEEDS Skin     Verbally                         Personal Care Assistance Level of Assistance  Bathing, Feeding, Dressing Bathing Assistance: Limited assistance Feeding assistance: Independent Dressing Assistance: Limited assistance     Functional Limitations Info  Sight, Hearing, Speech Sight  Info: Adequate Hearing Info: Impaired Speech Info: Adequate    SPECIAL CARE FACTORS FREQUENCY  PT (By licensed PT), OT (By licensed OT)     PT Frequency: 5              Contractures Contractures Info: Not present    Additional Factors Info  Code Status Code Status Info: Full Code             Current Medications (08/07/2016):  This is the current hospital active medication list Current Facility-Administered Medications  Medication Dose Route Frequency Provider Last Rate Last Dose  . 0.9 %  sodium chloride infusion  250 mL Intravenous PRN Michael [place], MD      . acetaminophen (TYLENOL) tablet 650 mg  650 mg Oral Q6H PRN Michael Boston, MD   650 mg at 08/06/16 1613  . furosemide (LASIX) tablet 20 mg  20 mg Oral BID Michael Boston, MD   20 mg at 08/07/16 8299  . guaiFENesin (ROBITUSSIN) 100 MG/5ML solution 200 mg  200 mg Oral Q6H PRN Michael Boston, MD      . heparin injection 5,000 Units  5,000 Units Subcutaneous Q8H Alphonsa Overall, MD   5,000 Units at 08/06/16 2300  . lactated ringers bolus 1,000 mL  1,000 mL Intravenous Q8H PRN Michael Boston, MD      . levETIRAcetam (KEPPRA) tablet 500 mg  500 mg Oral BID Michael Boston, MD   500 mg at 08/07/16 1132  . loratadine (CLARITIN) tablet 10 mg  10 mg Oral Daily Michael Boston, MD  10 mg at 08/07/16 1132  . methocarbamol (ROBAXIN) 1,000 mg in dextrose 5 % 50 mL IVPB  1,000 mg Intravenous Q6H PRN Michael Boston, MD      . methocarbamol (ROBAXIN) tablet 1,000 mg  1,000 mg Oral Q6H PRN Michael Boston, MD      . morphine 2 MG/ML injection 1-4 mg  1-4 mg Intravenous Q2H PRN Alphonsa Overall, MD   2 mg at 08/03/16 2037  . multivitamin with minerals tablet   Oral Daily Michael Boston, MD   1 tablet at 08/07/16 1132  . ondansetron (ZOFRAN) tablet 4 mg  4 mg Oral Q6H PRN Alphonsa Overall, MD       Or  . ondansetron [facility]) injection 4 mg  4 mg Intravenous Q6H PRN Alphonsa Overall, MD      . potassium chloride 20 MEQ/15ML (10%) solution 20 mEq  20 mEq Oral Daily Michael Boston, MD   20 mEq at 08/07/16 1131  . primidone (MYSOLINE) tablet 500 mg  500 mg Oral BID Michael Boston, MD   500 mg at 08/07/16 1132  . sodium chloride flush (NS) 0.9 % injection 10-40 mL  10-40 mL Intracatheter PRN Alphonsa Overall, MD   10 mL at 08/03/16 0358  . sodium chloride flush (NS) 0.9 % injection 3 mL  3 mL Intravenous Q12H Michael Boston, MD   3 mL at 08/07/16 1132  . sodium chloride flush (NS) 0.9 % injection 3 mL  3 mL Intravenous PRN Michael Boston, MD      . traMADol [facility]at[facility]) tablet 50-100 mg  50-100 mg Oral Q6H PRN Michael Boston, MD   50 mg at 08/06/16 1105  . Vitamin D (Ergocalciferol) (DRISDOL) capsule 50,000 Units  50,000 Units Oral Q7 days Michael Boston, MD   50,000 Units at 08/07/16 0015     Discharge Medications: Please see discharge summary for a list of discharge medications.  Relevant Imaging Results:  Relevant Lab Results:   Additional Information SSN [id number].   Lia Hopping, LCSW

## 2016-08-07 NOTE — Evaluation (Signed)
Physical Therapy Evaluation Patient Details Name: [name] MRN: [id number] DOB: Jan 22, 1956 Today's Date: 08/07/2016   History of Present Illness  60 yo male adm for LAPAROSCOPIC assisted COLOSTOMY REVERSAL - 08/01/2016 - D. Lucia Gaskins; PMHx: crohn's dz, mental disability, epilepsy, syncope, LE numbness  Clinical Impression  Pt admitted with above diagnosis. Pt currently with functional limitations due to the deficits listed below (see PT Problem List).  Pt will benefit from skilled PT to increase their independence and safety with mobility to allow discharge to the venue listed below.  Pt should progress well, will follow in acute setting however do not feel pt will need f/u PT post acute     Follow Up Recommendations No PT follow up    Equipment Recommendations  None recommended by PT    Recommendations for Other Services       Precautions / Restrictions Precautions Precautions: Fall Restrictions Weight Bearing Restrictions: No      Mobility  Bed Mobility Overal bed mobility: Modified Independent                Transfers Overall transfer level: Needs assistance Equipment used: Rolling walker (2 wheeled) Transfers: Sit to/from Stand Sit to Stand: Supervision;Modified independent (Device/Increase time)         General transfer comment:  initial cues  for hand placement  Ambulation/Gait Ambulation/Gait assistance: Min guard;Supervision Ambulation Distance (Feet): 80 Feet Assistive device: Rolling walker (2 wheeled);Straight cane Gait Pattern/deviations: Step-through pattern;Decreased stride length     General Gait Details: pt able to progress to cane with encouragement; min/guard to close supervision for safety-- no overt LOB however pt reports feeling unsteady compared to baseline  Stairs            Wheelchair Mobility    Modified Rankin (Stroke Patients Only)       Balance                                              Pertinent Vitals/Pain Pain Assessment: No/denies pain    Home Living Family/patient expects to be discharged to:: Assisted living               Home Equipment: Cane - single point;Grab bars - toilet;Shower seat;Grab bars - tub/shower      Prior Function Level of Independence: Needs assistance   Gait / Transfers Assistance Needed: uses cane mobility, amb short distances/to dining hall, etc  ADL's / Homemaking Assistance Needed: assistance with bathing, dressing, meals        Hand Dominance   Dominant Hand: Right    Extremity/Trunk Assessment   Upper Extremity Assessment: Defer to OT evaluation           Lower Extremity Assessment: Overall WFL for tasks assessed         Communication   Communication: No difficulties  Cognition Arousal/Alertness: Awake/alert Behavior During Therapy: WFL for tasks assessed/performed Overall Cognitive Status:  (appropriate during PT/OT evals, hx mental disability)                      General Comments      Exercises     Assessment/Plan    PT Assessment Patient needs continued PT services  PT Problem List Decreased activity tolerance;Decreased balance;Decreased mobility          PT Treatment Interventions DME instruction;Gait training;Functional mobility training;Therapeutic activities;Therapeutic exercise;Patient/family education  PT Goals (Current goals can be found in the Care Plan section)  Acute Rehab PT Goals Patient Stated Goal: back to prior level of function PT Goal Formulation: With patient Time For Goal Achievement: 08/14/16 Potential to Achieve Goals: Good    Frequency Min 3X/week   Barriers to discharge        Co-evaluation PT/OT/SLP Co-Evaluation/Treatment: Yes Reason for Co-Treatment: For patient/therapist safety PT goals addressed during session: Mobility/safety with mobility         End of Session   Activity Tolerance: Patient tolerated treatment well Patient left: in  bed;with call bell/phone within reach;with bed alarm set           Time: 8446-5207 PT Time Calculation (min) (ACUTE ONLY): 15 min   Charges:   PT Evaluation $PT Eval Low Complexity: 1 Procedure     PT G Codes:        Alayha Babineaux 08-26-2016, 11:53 AM

## 2016-08-07 NOTE — Progress Notes (Signed)
[facility] Office:  [phone] General Surgery Progress Note   LOS: 6 days  POD -  6 Days Post-Op  Assessment/Plan: 1.  LAPAROSCOPIC assisted COLOSTOMY REVERSAL - 08/01/2016 - D. Jezabelle Chisolm  For end colostomy  On soft diet - having some trouble controlling his BM's and this has made him less mobile  1A.  Some separation of the wound - not infected, just separated.  May need to open up further.  2.  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 Nasif Bos             Followed by Dr. Lenna Sciara. Pyrtle  3. Seizure disorder  Idiopathic 4. Learning challenged.             He has moved to [facility] in [place] 5.  DVT prophylaxis - SQ heparin 6.  Required PICC line for IV access 7.  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  Primary problem seems to be controlling his BM's.  Objective:   Vitals:   08/06/16 2101 08/07/16 0455  BP: (!) 125/56 122/60  Pulse: 79 77  Resp: 16 16  Temp: 98 F (36.7 C) 97.9 F (36.6 C)     Intake/Output from previous day:  No intake/output data recorded.  Intake/Output this shift:  No intake/output data recorded.   Physical Exam:   General: WN older WM who is alert and oriented.    HEENT: Normal. Pupils equal. .   Lungs: Clear.     Heart:   3/6 systolic murmur   Abdomen: Soft   Wound: Some wound separation.  I have packed the wound more.  I may need to open it further, depending on how it does.   Lab Results:    No results for input(s): WBC, HGB, HCT, PLT in the last 72 hours.  BMET   No results for input(s): NA, K, CL, CO2, GLUCOSE, BUN, CREATININE, CALCIUM in the last 72 hours.  PT/INR  No results for input(s): LABPROT, INR in the last 72 hours.  ABG  No results for input(s): PHART, HCO3 in the last 72 hours.  Invalid input(s): PCO2, PO2   Studies/Results:  No results found.   Anti-infectives:   Anti-infectives    Start     Dose/Rate Route Frequency Ordered Stop   08/01/16 0530  cefoTEtan (CEFOTAN) 2 g in dextrose 5 % 50 mL IVPB     2 g 100 mL/hr over 30 Minutes Intravenous On call to O.R. 08/01/16 0530 08/01/16 0725      Jonathan Overall, MD, FACS Pager: [facility] Office: [phone] 08/07/2016

## 2016-08-07 NOTE — Progress Notes (Signed)
Pt has had 3 watery stools in past 12 hours. MD on call paged.

## 2016-08-07 NOTE — Progress Notes (Signed)
Occupational Therapy Evaluation Patient Details Name: [name] MRN: [id number] DOB: 1956-08-15 Today's Date: 08/07/2016    History of Present Illness 60 yo male adm for LAPAROSCOPIC assisted COLOSTOMY REVERSAL - 08/01/2016 - D. Lucia Gaskins; PMHx: crohn's dz, mental disability, epilepsy, syncope, LE numbness   Clinical Impression   Patient appears to be at or very close to baseline with ADLs at this time. No further OT needs identified. Will sign off.  Recommend return to his ALF where he receives A with ADLs as needed.     Follow Up Recommendations  No OT follow up    Equipment Recommendations  None recommended by OT    Recommendations for Other Services       Precautions / Restrictions Precautions Precautions: Fall Restrictions Weight Bearing Restrictions: No      Mobility Bed Mobility Overal bed mobility: Modified Independent                Transfers Overall transfer level: Needs assistance Equipment used: Rolling walker (2 wheeled) Transfers: Sit to/from Stand Sit to Stand: Supervision;Modified independent (Device/Increase time)         General transfer comment:  initial cues  for hand placement    Balance                                            ADL Overall ADL's : At baseline                                       General ADL Comments: Patient ambulated to bathroom and stood at sink for oral care. Reports toileting with nurse supervision for safety. Mobility good with RW and cane during evaluation. Patient normally receives assistance with bathing/dressing tasks at his ALF.     Vision     Perception     Praxis      Pertinent Vitals/Pain Pain Assessment: No/denies pain     Hand Dominance Right   Extremity/Trunk Assessment Upper Extremity Assessment Upper Extremity Assessment: Overall WFL for tasks assessed   Lower Extremity Assessment Lower Extremity Assessment: Defer to PT evaluation       Communication Communication Communication: No difficulties   Cognition Arousal/Alertness: Awake/alert Behavior During Therapy: WFL for tasks assessed/performed Overall Cognitive Status:  (appropriate during PT/OT evals, hx mental disability)                     General Comments       Exercises       Shoulder Instructions      Home Living Family/patient expects to be discharged to:: Assisted living                             Home Equipment: Cane - single point;Grab bars - toilet;Shower seat;Grab bars - tub/shower          Prior Functioning/Environment Level of Independence: Needs assistance  Gait / Transfers Assistance Needed: uses cane mobility, amb short distances/to dining hall, etc ADL's / Homemaking Assistance Needed: assistance with bathing, dressing, meals            OT Problem List: Decreased cognition   OT Treatment/Interventions:      OT Goals(Current goals can be found in the care plan section) Acute  Rehab OT Goals Patient Stated Goal: back to prior level of function OT Goal Formulation: All assessment and education complete, DC therapy  OT Frequency:     Barriers to D/C:            Co-evaluation PT/OT/SLP Co-Evaluation/Treatment: Yes Reason for Co-Treatment: For patient/therapist safety PT goals addressed during session: Mobility/safety with mobility OT goals addressed during session: ADL's and self-care      End of Session Equipment Utilized During Treatment: Rolling walker  Activity Tolerance: Patient tolerated treatment well Patient left: in bed;with call bell/phone within reach;with bed alarm set   Time: 2919-1660 OT Time Calculation (min): 15 min Charges:  OT General Charges $OT Visit: 1 Procedure OT Evaluation $OT Eval Low Complexity: 1 Procedure G-Codes:    Sharnice Bosler A Aug 19, 2016, 2:15 PM

## 2016-08-08 ENCOUNTER — Inpatient Hospital Stay (HOSPITAL_COMMUNITY): Payer: Medicare Other

## 2016-08-08 DIAGNOSIS — M7989 Other specified soft tissue disorders: Secondary | ICD-10-CM

## 2016-08-08 DIAGNOSIS — M79609 Pain in unspecified limb: Secondary | ICD-10-CM

## 2016-08-08 LAB — CBC
HCT: 32.6 % — ABNORMAL LOW (ref 39.0–52.0)
Hemoglobin: 10.7 g/dL — ABNORMAL LOW (ref 13.0–17.0)
MCH: 31.7 pg (ref 26.0–34.0)
MCHC: 32.8 g/dL (ref 30.0–36.0)
MCV: 96.4 fL (ref 78.0–100.0)
PLATELETS: 258 10*3/uL (ref 150–400)
RBC: 3.38 MIL/uL — ABNORMAL LOW (ref 4.22–5.81)
RDW: 13.9 % (ref 11.5–15.5)
WBC: 7 10*3/uL (ref 4.0–10.5)

## 2016-08-08 LAB — BASIC METABOLIC PANEL
Anion gap: 6 (ref 5–15)
BUN: 16 mg/dL (ref 6–20)
CALCIUM: 8.1 mg/dL — AB (ref 8.9–10.3)
CO2: 23 mmol/L (ref 22–32)
Chloride: 107 mmol/L (ref 101–111)
Creatinine, Ser: 1.03 mg/dL (ref 0.61–1.24)
GFR calc Af Amer: >60 mL/min (ref >60)
GLUCOSE: 114 mg/dL — AB (ref 65–99)
Potassium: 3.5 mmol/L (ref 3.5–5.1)
SODIUM: 136 mmol/L (ref 135–145)

## 2016-08-08 LAB — PROTIME-INR
INR: 1.2
PROTHROMBIN TIME: 15.3 s — AB (ref 11.4–15.2)

## 2016-08-08 MED ORDER — WARFARIN - PHARMACIST DOSING INPATIENT: Freq: Every day | Status: DC

## 2016-08-08 MED ORDER — WARFARIN VIDEO: Freq: Once | Status: DC

## 2016-08-08 MED ORDER — COUMADIN BOOK
Freq: Once | Status: DC
  Filled 2016-08-08: qty 1

## 2016-08-08 MED ORDER — WARFARIN SODIUM 5 MG PO TABS
7.5000 mg | ORAL_TABLET | Freq: Once | ORAL | Status: AC
  Administered 2016-08-08: 7.5 mg via ORAL
  Filled 2016-08-08: qty 1

## 2016-08-08 MED ORDER — ENOXAPARIN SODIUM 150 MG/ML ~~LOC~~ SOLN
140.0000 mg | SUBCUTANEOUS | Status: DC
  Administered 2016-08-08 – 2016-08-09 (×2): 140 mg via SUBCUTANEOUS
  Filled 2016-08-08 (×2): qty 0.93

## 2016-08-08 NOTE — Progress Notes (Signed)
LLE swelling noted this morning. Upon assessment, patient dose note pain. Pulses palpable. Capillary refill less than 3 seconds. Dr. Lucia Gaskins notified, ultrasound order placed.

## 2016-08-08 NOTE — Progress Notes (Signed)
[facility] Office:  [phone] General Surgery Progress Note   LOS: 7 days  POD -  7 Days Post-Op  Assessment/Plan: 1.  LAPAROSCOPIC assisted COLOSTOMY REVERSAL - 08/01/2016 - D. Rainier Feuerborn  For end colostomy  Doing well on diet - possibly to [facility] today.  Depends on leg  1A.  I opened midline wound up and will start dressing changes  2.  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 Uriah Philipson             Followed by Dr. Lenna Sciara. Pyrtle  3. Seizure disorder  Idiopathic 4. Learning challenged.             He has moved to [facility] in [place] 5.  DVT prophylaxis - SQ heparin 6.  Leg swelling - will get dopplers   Active Problems:   Crohn's disease (HCC)   Personal history of immunosupression therapy   Aortic valve regurgitation   Localization-related idiopathic epilepsy and epileptic syndromes with seizures of localized onset, not intractable, without status epilepticus ([place])   Crohn's disease of both small and large intestine with fistula ([place])   S/P colostomy takedown   Mental disability   Subjective:  Doing well.  Sister in room.  He is going to [facility], if his leg is okay.  Objective:   Vitals:   08/07/16 2158 08/08/16 0500  BP: (!) 110/47 (!) 115/52  Pulse: 79 81  Resp: 18 16  Temp: 98.5 F (36.9 C) 98.2 F (36.8 C)     Intake/Output from previous day:  No intake/output data recorded.  Intake/Output this shift:  No intake/output data recorded.   Physical Exam:   General: WN older WM who is alert and oriented.    HEENT: Normal. Pupils equal. .   Lungs: Clear.     Heart:  3/6 systolic murmur   Abdomen: Soft   Wound: I opened the wound today a little further  and started packing the wound.   Lab Results:    No results for input(s): WBC, HGB, HCT, PLT in the last 72 hours.  BMET   No results for input(s): NA, K, CL, CO2, GLUCOSE, BUN, CREATININE, CALCIUM in the last 72 hours.  PT/INR  No results for input(s): LABPROT, INR in the last 72 hours.  ABG  No results for input(s): PHART, HCO3 in the last 72 hours.  Invalid input(s): PCO2, PO2   Studies/Results:  No results found.   Anti-infectives:   Anti-infectives    Start     Dose/Rate Route Frequency Ordered Stop   08/01/16 0530  cefoTEtan (CEFOTAN) 2 g in dextrose 5 % 50 mL IVPB     2 g 100 mL/hr over 30 Minutes Intravenous On call to O.R. 08/01/16 0530 08/01/16 0725      Alphonsa Overall, MD, FACS Pager: [facility] Office: [phone] 08/08/2016

## 2016-08-08 NOTE — Progress Notes (Signed)
VASCULAR LAB PRELIMINARY  PRELIMINARY  PRELIMINARY  PRELIMINARY  Bilateral lower extremity venous duplex completed.    Preliminary report: Left - Positive for an acute occlusive DVT noted throughout the popliteal vein coursing through the proximal profunda, femoral, and common femoral veins. There is no evidence of DVT in the posterior tibial and peroneal veins at this time. Also noted is a superficial thrombus of the proximal greater saphenous vein into the saphenofemoral junction. No Baker's cyst was identified. Right:  No evidence of DVT, superficial thrombosis, or Baker's cyst.  Quatisha Zylka, RVS 08/08/2016, 12:33 PM

## 2016-08-08 NOTE — Progress Notes (Signed)
ANTICOAGULATION CONSULT NOTE - Initial Consult  Pharmacy Consult for lovenox and warfarin Indication: new DVT  No Known Allergies  Patient Measurements: Height: 5' 10"  (177.8 cm) Weight: 204 lb (92.5 kg) IBW/kg (Calculated) : 73 Heparin Dosing Weight:  Vital Signs: Temp: 98.2 F (36.8 C) (10/17 0500) Temp Source: Oral (10/17 0500) BP: 115/52 (10/17 0500) Pulse Rate: 81 (10/17 0500)  Labs: No results for input(s): HGB, HCT, PLT, APTT, LABPROT, INR, HEPARINUNFRC, HEPRLOWMOCWT, CREATININE, CKTOTAL, CKMB, TROPONINI in the last 72 hours.  Estimated Creatinine Clearance: 105.5 mL/min (by C-G formula based on SCr of 0.83 mg/dL).   Medical History: Past Medical History:  Diagnosis Date  . Abscess of anal and rectal regions   . Crohn's disease ([place])   . Epilepsy ([place])    "petit mal; only at home" (6/9//2015)  . Heart murmur   . History of blood transfusion ~ 1992   "related to my Crohn's"  . History of stomach ulcers   . Hyperplastic colon polyp   . Iron deficiency anemia, unspecified   . Mental disability   . Other B-complex deficiencies   . REGIONAL ENTERITIS, LARGE INTESTINE 10/05/2000   Qualifier: Diagnosis of  By: Jerral Ralph    . S/P partial colectomy for Crohn's disease 12/01/2015  . SBO (small bowel obstruction) 12/01/2015  . Syncope 03/31/2014  . Syncope and collapse 03/31/2014   "while driving"    Assessment: Patient is a 60 y.o M with Crohn's disease s/p colostomy on 10/10, c/o of LLE swelling with doppler now back positive for new left DVT.  To start lovenox and warfarin for DVT.  Patient's on primidone which is a CYP2C9 inducer and this can increase the metabolism of warfarin.  Will monitor INR closely and adjust warfarin dose based on response.  Goal of Therapy:  INR 2-3 Anti-Xa level 0.6-1 units/ml 4hrs after LMWH dose given Monitor platelets by anticoagulation protocol: Yes   Plan:  - lovenox 140 mg SQ q24h (1.5 mg/kg/day) - warfarin 7.5 mg PO x1  today - cbc q72hr, daily INR - monitor for s/s bleeding  Schae Cando P 08/08/2016,12:49 PM

## 2016-08-09 LAB — PROTIME-INR
INR: 1.08
Prothrombin Time: 14 seconds (ref 11.4–15.2)

## 2016-08-09 MED ORDER — ENOXAPARIN SODIUM 100 MG/ML ~~LOC~~ SOLN: 140.0000 mg | SUBCUTANEOUS | 0 refills | Status: DC

## 2016-08-09 MED ORDER — WARFARIN SODIUM 7.5 MG PO TABS: 7.5000 mg | ORAL_TABLET | Freq: Once | ORAL | 0 refills | Status: DC

## 2016-08-09 MED ORDER — WARFARIN SODIUM 5 MG PO TABS: 7.5000 mg | ORAL_TABLET | Freq: Once | ORAL | Status: DC

## 2016-08-09 NOTE — Care Management Important Message (Signed)
Important Message  Patient Details  Name: BEACHER EVERY MRN: [id number] Date of Birth: 15-Jan-1956   Medicare Important Message Given:  Yes    [name] 08/09/2016, 10:08 AMImportant Message  Patient Details  Name: KERMIT ARNETTE MRN: [id number] Date of Birth: 1956/01/29   Medicare Important Message Given:  Yes    [name] 08/09/2016, 10:08 AM

## 2016-08-09 NOTE — Discharge Instructions (Addendum)
Information on my medicine - Coumadin   (Warfarin)  This medication education was reviewed with me or my healthcare representative as part of my discharge preparation.  The pharmacist that spoke with me during my hospital stay was:  Sallyanne Havers, Student-PharmD  Why was Coumadin prescribed for you? Coumadin was prescribed for you because you have a blood clot or a medical condition that can cause an increased risk of forming blood clots. Blood clots can cause serious health problems by blocking the flow of blood to the heart, lung, or brain. Coumadin can prevent harmful blood clots from forming. As a reminder your indication for Coumadin is:   Deep Vein Thrombosis Treatment  What test will check on my response to Coumadin? While on Coumadin (warfarin) you will need to have an INR test regularly to ensure that your dose is keeping you in the desired range. The INR (international normalized ratio) number is calculated from the result of the laboratory test called prothrombin time (PT).  If an INR APPOINTMENT HAS NOT ALREADY BEEN MADE FOR YOU please schedule an appointment to have this lab work done by your health care provider within 7 days. Your INR goal is usually a number between:  2 to 3 or your provider may give you a more narrow range like 2-2.5.  Ask your health care provider during an office visit what your goal INR is.  What  do you need to  know  About  COUMADIN? Take Coumadin (warfarin) exactly as prescribed by your healthcare provider about the same time each day.  DO NOT stop taking without talking to the doctor who prescribed the medication.  Stopping without other blood clot prevention medication to take the place of Coumadin may increase your risk of developing a new clot or stroke.  Get refills before you run out.  What do you do if you miss a dose? If you miss a dose, take it as soon as you remember on the same day then continue your regularly scheduled regimen the next day.  Do not  take two doses of Coumadin at the same time.  Important Safety Information A possible side effect of Coumadin (Warfarin) is an increased risk of bleeding. You should call your healthcare provider right away if you experience any of the following: ? Bleeding from an injury or your nose that does not stop. ? Unusual colored urine (red or dark brown) or unusual colored stools (red or black). ? Unusual bruising for unknown reasons. ? A serious fall or if you hit your head (even if there is no bleeding).  Some foods or medicines interact with Coumadin (warfarin) and might alter your response to warfarin. To help avoid this: ? Eat a balanced diet, maintaining a consistent amount of Vitamin K. ? Notify your provider about major diet changes you plan to make. ? Avoid alcohol or limit your intake to 1 drink for women and 2 drinks for men per day. (1 drink is 5 oz. wine, 12 oz. beer, or 1.5 oz. liquor.)  Make sure that ANY health care provider who prescribes medication for you knows that you are taking Coumadin (warfarin).  Also make sure the healthcare provider who is monitoring your Coumadin knows when you have started a new medication including herbals and non-prescription products.  Coumadin (Warfarin)  Major Drug Interactions  Increased Warfarin Effect Decreased Warfarin Effect  Alcohol (large quantities) Antibiotics (esp. Septra/Bactrim, Flagyl, Cipro) Amiodarone (Cordarone) Aspirin (ASA) Cimetidine (Tagamet) Megestrol (Megace) NSAIDs (ibuprofen, naproxen, etc.) Piroxicam (  Feldene) Propafenone (Rythmol SR) Propranolol (Inderal) Isoniazid (INH) Posaconazole (Noxafil) Barbiturates (Phenobarbital) Carbamazepine (Tegretol) Chlordiazepoxide (Librium) Cholestyramine (Questran) Griseofulvin Oral Contraceptives Rifampin Sucralfate (Carafate) Vitamin K   Coumadin (Warfarin) Major Herbal Interactions  Increased Warfarin Effect Decreased Warfarin Effect  Garlic Ginseng Ginkgo biloba  Coenzyme Q10 Green tea St. Johns wort    Coumadin (Warfarin) FOOD Interactions  Eat a consistent number of servings per week of foods HIGH in Vitamin K (1 serving =  cup)  Collards (cooked, or boiled & drained) Kale (cooked, or boiled & drained) Mustard greens (cooked, or boiled & drained) Parsley *serving size only =  cup Spinach (cooked, or boiled & drained) Swiss chard (cooked, or boiled & drained) Turnip greens (cooked, or boiled & drained)  Eat a consistent number of servings per week of foods MEDIUM-HIGH in Vitamin K (1 serving = 1 cup)  Asparagus (cooked, or boiled & drained) Broccoli (cooked, boiled & drained, or raw & chopped) Brussel sprouts (cooked, or boiled & drained) *serving size only =  cup Lettuce, raw (green leaf, endive, romaine) Spinach, raw Turnip greens, raw & chopped   These websites have more information on Coumadin (warfarin):  FailFactory.se; VeganReport.com.au;   [facility] - DISCHARGE INSTRUCTIONS TO PATIENT  Activity:  Lifting - No lifting more than 15 pounds for 3 weeks, then no limit  Wound Care:   Change abdominal wound dressing twice a day.         Get in the shower daily.  May use soap and water over wound.  Diet:  As tolerated  Follow up appointment:  Call Dr. Pollie Friar office [facility]) at [phone] for an appointment in 1 week.  Medications and dosages:  Follow pharmacy for Lovenox and Coumadin plans.  These will be managed at [facility].  Lovenox 140 mg QD Coumadin 7.5 mg INR daily - goal INR 2 to 3 CBC every 3 days  Call Dr. Lucia Gaskins or his office  [phone]) if you have:  Temperature greater than 100.4,  Persistent nausea and vomiting,  Severe uncontrolled pain,  Redness, tenderness, or signs of infection (pain, swelling, redness, odor or green/yellow discharge around the site),  Any other questions or concerns you may have after discharge.  In an emergency, call 911 or go to  an Emergency Department at a nearby hospital.

## 2016-08-09 NOTE — Discharge Summary (Signed)
Physician Discharge Summary  Patient ID:  [name]  MRN: [id number]  DOB/AGE: 04-04-1956 60 y.o.  Admit date: 08/01/2016 Discharge date: 08/09/2016  Discharge Diagnoses:  1.  End colostomy           2. Open midline wound.    Needs local wound care.  3.  Left leg DVT             On Lovenox/Coumadin per pharmacy             Kathrin Greathouse, CSW, has arranged management of this at [facility]  4.  History of CROHN'S DISEASE WITH FISTULA, UNSPECIFIED GASTROINTESTINAL TRACT LOCATION (K50.913) Laparoscopic enterolysis of adhesions, laparoscopic-assisted small bowel resection, hand assisted left hemicolectomy with mobilization of splenic flexure, and left transverse colon end colostomy with [name] pouch - 12/04/2015 - D. Rubby Barbary Followed by Dr. Lenna Sciara. Pyrtle  5. Seizure disorder             Idiopathic 6. Learning challenged. He has moved to [facility] in [place] Problems:   Crohn's disease [facility])   Personal history of immunosupression therapy   Aortic valve regurgitation   Localization-related idiopathic epilepsy and epileptic syndromes with seizures of localized onset, not intractable, without status epilepticus ([place])   Crohn's disease of both small and large intestine with fistula ([place])   S/P colostomy takedown   Mental disability   Operation: Procedure(s): LAPAROSCOPIC assisted COLOSTOMY REVERSAL on 08/01/2016 - D. Lucia Gaskins  Discharged Condition: good  Hospital Course: [name] is an 60 y.o. male whose primary care physician is Bonnita Nasuti, MD and who was admitted 08/01/2016 with a chief complaint of end colostomy after left hemicolectomy in February, 2017.   He was brought to the operating room on 08/01/2016 and underwent  LAPAROSCOPIC assisted COLOSTOMY REVERSAL  He did well post op.  He started passing flatus around 4 days post op.  His diet was advanced.  He was actually ready to go home on 08/08/2016, but was  found to have a swollen left leg.  The dopplers of his leg showed a DVT.  He has been started on Lovenox and Coumadin.  Kathrin Greathouse has arranged follow up with this at [place] home.  He is ready to go home from the wound and GI standpoint. His sister, [name], who lives in [place], is here with him.   The discharge instructions were reviewed with the patient.  Consults: Pharmacy  Significant Diagnostic Studies: Results for orders placed or performed during the hospital encounter of 08/01/16  C difficile quick scan w PCR reflex  Result Value Ref Range   C Diff antigen NEGATIVE NEGATIVE   C Diff toxin NEGATIVE NEGATIVE   C Diff interpretation No C. difficile detected.   Basic metabolic panel  Result Value Ref Range   Sodium 131 (L) 135 - 145 mmol/L   Potassium 4.9 3.5 - 5.1 mmol/L   Chloride 102 101 - 111 mmol/L   CO2 22 22 - 32 mmol/L   Glucose, Bld 117 (H) 65 - 99 mg/dL   BUN 10 6 - 20 mg/dL   Creatinine, Ser 1.00 0.61 - 1.24 mg/dL   Calcium 7.9 (L) 8.9 - 10.3 mg/dL   GFR calc non Af Amer >60 >60 mL/min   GFR calc Af Amer >60 >60 mL/min   Anion gap 7 5 - 15  CBC  Result Value Ref Range   WBC 13.4 (H) 4.0 - 10.5 K/uL   RBC 4.01 (L)  4.22 - 5.81 MIL/uL   Hemoglobin 13.0 13.0 - 17.0 g/dL   HCT 38.5 (L) 39.0 - 52.0 %   MCV 96.0 78.0 - 100.0 fL   MCH 32.4 26.0 - 34.0 pg   MCHC 33.8 30.0 - 36.0 g/dL   RDW 14.3 11.5 - 15.5 %   Platelets 248 150 - 400 K/uL  Basic metabolic panel  Result Value Ref Range   Sodium 135 135 - 145 mmol/L   Potassium 4.1 3.5 - 5.1 mmol/L   Chloride 106 101 - 111 mmol/L   CO2 24 22 - 32 mmol/L   Glucose, Bld 134 (H) 65 - 99 mg/dL   BUN 7 6 - 20 mg/dL   Creatinine, Ser 0.92 0.61 - 1.24 mg/dL   Calcium 8.0 (L) 8.9 - 10.3 mg/dL   GFR calc non Af Amer >60 >60 mL/min   GFR calc Af Amer >60 >60 mL/min   Anion gap 5 5 - 15  CBC with Differential/Platelet  Result Value Ref Range   WBC 11.3 (H) 4.0 - 10.5 K/uL   RBC 3.80 (L) 4.22 -  5.81 MIL/uL   Hemoglobin 12.3 (L) 13.0 - 17.0 g/dL   HCT 35.6 (L) 39.0 - 52.0 %   MCV 93.7 78.0 - 100.0 fL   MCH 32.4 26.0 - 34.0 pg   MCHC 34.6 30.0 - 36.0 g/dL   RDW 14.4 11.5 - 15.5 %   Platelets 236 150 - 400 K/uL   Neutrophils Relative % 86 %   Neutro Abs 9.8 (H) 1.7 - 7.7 K/uL   Lymphocytes Relative 3 %   Lymphs Abs 0.3 (L) 0.7 - 4.0 K/uL   Monocytes Relative 11 %   Monocytes Absolute 1.2 (H) 0.1 - 1.0 K/uL   Eosinophils Relative 0 %   Eosinophils Absolute 0.0 0.0 - 0.7 K/uL   Basophils Relative 0 %   Basophils Absolute 0.0 0.0 - 0.1 K/uL  Basic metabolic panel  Result Value Ref Range   Sodium 138 135 - 145 mmol/L   Potassium 3.9 3.5 - 5.1 mmol/L   Chloride 110 101 - 111 mmol/L   CO2 24 22 - 32 mmol/L   Glucose, Bld 109 (H) 65 - 99 mg/dL   BUN 5 (L) 6 - 20 mg/dL   Creatinine, Ser 0.83 0.61 - 1.24 mg/dL   Calcium 7.9 (L) 8.9 - 10.3 mg/dL   GFR calc non Af Amer >60 >60 mL/min   GFR calc Af Amer >60 >60 mL/min   Anion gap 4 (L) 5 - 15  CBC with Differential/Platelet  Result Value Ref Range   WBC 9.2 4.0 - 10.5 K/uL   RBC 3.54 (L) 4.22 - 5.81 MIL/uL   Hemoglobin 11.3 (L) 13.0 - 17.0 g/dL   HCT 34.6 (L) 39.0 - 52.0 %   MCV 97.7 78.0 - 100.0 fL   MCH 31.9 26.0 - 34.0 pg   MCHC 32.7 30.0 - 36.0 g/dL   RDW 14.6 11.5 - 15.5 %   Platelets 215 150 - 400 K/uL   Neutrophils Relative % 84 %   Neutro Abs 7.7 1.7 - 7.7 K/uL   Lymphocytes Relative 4 %   Lymphs Abs 0.4 (L) 0.7 - 4.0 K/uL   Monocytes Relative 12 %   Monocytes Absolute 1.1 (H) 0.1 - 1.0 K/uL   Eosinophils Relative 0 %   Eosinophils Absolute 0.0 0.0 - 0.7 K/uL   Basophils Relative 0 %   Basophils Absolute 0.0 0.0 - 0.1 K/uL  Basic  metabolic panel  Result Value Ref Range   Sodium 136 135 - 145 mmol/L   Potassium 3.5 3.5 - 5.1 mmol/L   Chloride 107 101 - 111 mmol/L   CO2 23 22 - 32 mmol/L   Glucose, Bld 114 (H) 65 - 99 mg/dL   BUN 16 6 - 20 mg/dL   Creatinine, Ser 1.03 0.61 - 1.24 mg/dL   Calcium 8.1 (L)  8.9 - 10.3 mg/dL   GFR calc non Af Amer >60 >60 mL/min   GFR calc Af Amer >60 >60 mL/min   Anion gap 6 5 - 15  CBC  Result Value Ref Range   WBC 7.0 4.0 - 10.5 K/uL   RBC 3.38 (L) 4.22 - 5.81 MIL/uL   Hemoglobin 10.7 (L) 13.0 - 17.0 g/dL   HCT 32.6 (L) 39.0 - 52.0 %   MCV 96.4 78.0 - 100.0 fL   MCH 31.7 26.0 - 34.0 pg   MCHC 32.8 30.0 - 36.0 g/dL   RDW 13.9 11.5 - 15.5 %   Platelets 258 150 - 400 K/uL  Protime-INR  Result Value Ref Range   Prothrombin Time 15.3 (H) 11.4 - 15.2 seconds   INR 1.20   Protime-INR  Result Value Ref Range   Prothrombin Time 14.0 11.4 - 15.2 seconds   INR 1.08   Type and screen [place]  Result Value Ref Range   ABO/RH(D) A POS    Antibody Screen NEG    Sample Expiration 08/04/2016   ABO/Rh  Result Value Ref Range   ABO/RH(D) A POS     No results found.  Discharge Exam:  Vitals:   08/08/16 2048 08/09/16 0459  BP: (!) 111/55 (!) 109/56  Pulse: 80 77  Resp: 18 18  Temp: 98.7 F (37.1 C) 98.4 F (36.9 C)    General: WN WM who is alert and generally healthy appearing.  Lungs: Clear to auscultation and symmetric breath sounds. Heart:  RRR. No murmur or rub. Abdomen: Soft. No mass. No tenderness. No hernia. Normal bowel sounds.  Extremities:  Good strength and ROM  in upper and lower extremities. Neurologic:  Grossly intact to motor and sensory function. Psychiatric: Has normal mood and affect. Behavior is normal.   Discharge Medications:     Medication List    TAKE these medications   acetaminophen 500 MG tablet Commonly known as:  TYLENOL Take 1,000 mg by mouth at bedtime.   acetaminophen 325 MG tablet Commonly known as:  TYLENOL Take 650 mg by mouth every 6 (six) hours as needed for moderate pain or fever.   cyanocobalamin 1000 MCG/ML injection Commonly known as:  (VITAMIN B-12) Inject 1 mL (1,000 mcg total) into the muscle every 30 (thirty) days.   DAILY VITE PO Take 1 tablet by mouth daily.    diphenhydrAMINE 25 mg capsule Commonly known as:  BENADRYL Take 25 mg by mouth every 4 (four) hours as needed (for allergic reaction).   furosemide 20 MG tablet Commonly known as:  LASIX Take 20 mg by mouth 2 (two) times daily.   guaifenesin 100 MG/5ML syrup Commonly known as:  ROBITUSSIN Take 200 mg by mouth every 6 (six) hours as needed for cough (Do not exceed 4 doses in 24 hours. If cough has not improved in 24 hours notify physician.).   levETIRAcetam 500 MG tablet Commonly known as:  KEPPRA Take 1 tablet (500 mg total) by mouth 2 (two) times daily.   loratadine 10 MG tablet Commonly known  as:  CLARITIN Take 10 mg by mouth daily.   mupirocin ointment 2 % Commonly known as:  BACTROBAN Apply 1 application topically daily. Clean wound on lower abdominal and apply.   ondansetron 4 MG tablet Commonly known as:  ZOFRAN Take 4 mg by mouth every 6 (six) hours as needed for nausea or vomiting.   potassium chloride 20 MEQ/15ML (10%) Soln Take 20 mEq by mouth daily.   primidone 250 MG tablet Commonly known as:  MYSOLINE Take 2 tablets (500 mg total) by mouth 2 (two) times daily.   Vitamin D (Ergocalciferol) 50000 units Caps capsule Commonly known as:  DRISDOL Take 50,000 Units by mouth every 7 (seven) days.       Disposition: 03-Skilled Nursing Facility  Discharge Instructions    Diet - low sodium heart healthy    Complete by:  As directed    Increase activity slowly    Complete by:  As directed        Activity:  Lifting - No lifting more than 15 pounds for 3 weeks, then no limit  Wound Care:   Change abdominal wound dressing twice a day.         Get in the shower daily.  May use soap and water over wound.  Diet:  As tolerated  Follow up appointment:  Call Dr. Pollie Friar office [facility]) at [phone] for an appointment in 1 week.  Medications and dosages:  Follow pharmacy for Lovenox and Coumadin plans.  These will be managed at  [facility].  Lovenox 140 mg QD Coumadin 7.5 mg INR daily - goal INR 2 to 3 CBC every 3 days  Signed: Alphonsa Overall, M.D., [facility] Office:  [phone]  08/09/2016, 12:27 PM

## 2016-08-09 NOTE — Progress Notes (Signed)
Patient has a bed at [place] at [facility]. [facility] will be able to provide Coumadin and Lovenox for patient. L[facility] also contacted patient [facility] and they will be able to follow up with Coumadin once patient is discharged from SNF. LCSWA informed Dr. Lucia Gaskins and patient sister [name].

## 2016-08-09 NOTE — Progress Notes (Signed)
08/09/16  0810  Spoke with IV team and they do not recommend allowing patient's to shower with PICC line.

## 2016-08-09 NOTE — Progress Notes (Signed)
Date: August 09, 2016 Discharge orders checked for needs. No case management needs present at time of discharge.  Being discharged to [facility] in [place], [place], [place], [place]   [phone]

## 2016-08-09 NOTE — Progress Notes (Signed)
[facility]:  [phone] General Surgery Progress Note   LOS: 8 days  POD -  8 Days Post-Op  Assessment/Plan: 1.  LAPAROSCOPIC assisted COLOSTOMY REVERSAL - 08/01/2016 - [name]  For end colostomy  Doing well on diet - possibly to [facility] today.  Depends on leg  1A.  I opened midline wound.  This is a separation of the wound, not an infection.  It needs local wound care.  2.  Left leg DVT  On Lovenox/Coumadin per pharmacy  This will hold up discharge  Discussed with Kathrin Greathouse, CSW, about discharge planning.  3.  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 Westley Blass             Followed by Dr. Lenna Sciara. Pyrtle  4. Seizure disorder  Idiopathic 5. Learning challenged.             He has moved to [facility] in [place] Problems:   Crohn's disease [facility])   Personal history of immunosupression therapy   Aortic valve regurgitation   Localization-related idiopathic epilepsy and epileptic syndromes with seizures of localized onset, not intractable, without status epilepticus ([place])   Crohn's disease of both small and large intestine with fistula ([place])   S/P colostomy takedown   Mental disability   Subjective:  Doing well.  His left leg is bothering him some.  Sister in room.  He is going to [facility], when his anticoagulation is set.  Objective:   Vitals:   08/08/16 2048 08/09/16 0459  BP: (!) 111/55 (!) 109/56  Pulse: 80 77  Resp: 18 18  Temp: 98.7 F (37.1 C) 98.4 F (36.9 C)     Intake/Output from previous day:  10/17 0701 - 10/18 0700 In: 480 [P.O.:480] Out: -   Intake/Output this shift:  Total I/O In: 10 [I.V.:10] Out: -    Physical  Exam:   General: WN older WM who is alert and oriented.    HEENT: Normal. Pupils equal. .   Lungs: Clear.     Heart:  3/6 systolic murmur   Abdomen: Soft   Wound: I opened the wound today a little further.  Continue BID dressing changes.  We will get him in the shower.   Lab Results:     Recent Labs  08/08/16 1300  WBC 7.0  HGB 10.7*  HCT 32.6*  PLT 258    BMET    Recent Labs  08/08/16 1300  NA 136  K 3.5  CL 107  CO2 23  GLUCOSE 114*  BUN 16  CREATININE 1.03  CALCIUM 8.1*    PT/INR    Recent Labs  08/08/16 1300 08/09/16 0455  LABPROT 15.3* 14.0  INR 1.20 1.08    ABG  No results for input(s): PHART, HCO3 in the last 72 hours.  Invalid input(s): PCO2, PO2   Studies/Results:  No results found.   Anti-infectives:   Anti-infectives    Start     Dose/Rate Route Frequency Ordered Stop   08/01/16 0530  cefoTEtan (CEFOTAN) 2 g in dextrose 5 % 50 mL IVPB     2 g 100 mL/hr over 30 Minutes Intravenous On call to O.R. 08/01/16 0530 08/01/16 0725  Alphonsa Overall, MD, FACS Pager: [phone] Surgery Office: [phone] 08/09/2016

## 2016-08-09 NOTE — Progress Notes (Signed)
ANTICOAGULATION CONSULT NOTE - Initial Consult  Pharmacy Consult for lovenox and warfarin Indication: new DVT  No Known Allergies  Patient Measurements: Height: 5' 10"  (177.8 cm) Weight: 204 lb (92.5 kg) IBW/kg (Calculated) : 73 Heparin Dosing Weight:  Vital Signs: Temp: 98.4 F (36.9 C) (10/18 0459) Temp Source: Oral (10/18 0459) BP: 109/56 (10/18 0459) Pulse Rate: 77 (10/18 0459)  Labs:  Recent Labs  08/08/16 1300 08/09/16 0455  HGB 10.7*  --   HCT 32.6*  --   PLT 258  --   LABPROT 15.3* 14.0  INR 1.20 1.08  CREATININE 1.03  --     Estimated Creatinine Clearance: 85 mL/min (by C-G formula based on SCr of 1.03 mg/dL).   Medical History: Past Medical History:  Diagnosis Date  . Abscess of anal and rectal regions   . Crohn's disease ([place])   . Epilepsy ([place])    "petit mal; only at home" (6/9//2015)  . Heart murmur   . History of blood transfusion ~ 1992   "related to my Crohn's"  . History of stomach ulcers   . Hyperplastic colon polyp   . Iron deficiency anemia, unspecified   . Mental disability   . Other B-complex deficiencies   . REGIONAL ENTERITIS, LARGE INTESTINE 10/05/2000   Qualifier: Diagnosis of  By: Jerral Ralph    . S/P partial colectomy for Crohn's disease 12/01/2015  . SBO (small bowel obstruction) 12/01/2015  . Syncope 03/31/2014  . Syncope and collapse 03/31/2014   "while driving"    Assessment: Patient is a 60 y.o M with Crohn's disease s/p colostomy on 10/10, c/o of LLE swelling with doppler now back positive for new left DVT.  To start lovenox and warfarin for DVT.  Today, 08/09/2016: - day #2 of 5 days minimum of AC overlap - INR subtherapeutic at 1.08 with first dose of warfarin given yesterday - mo bleeding documented - drug- drug intxns:  primidone which is a CYP2C9 inducer and this can increase the metabolism of warfarin. - eating 50-75% of meals   Goal of Therapy:  INR 2-3 Anti-Xa level 0.6-1 units/ml 4hrs after LMWH  dose given Monitor platelets by anticoagulation protocol: Yes   Plan:  - continue lovenox 140 mg SQ q24h (1.5 mg/kg/day) - repeat warfarin 7.5 mg PO x1 today - cbc q72hr, daily INR - monitor for s/s bleeding  Ahana Najera P 08/09/2016,10:14 AM

## 2016-08-09 NOTE — Progress Notes (Signed)
08/09/16  0808  Notified IV team that patient would need PICC Line wrapped for shower. MD wrote order for patient to shower.

## 2016-08-09 NOTE — Clinical Social Work Placement (Signed)
   CLINICAL SOCIAL WORK PLACEMENT  NOTE  Date:  08/09/2016  Patient Details  Name: [name] MRN: [id number] Date of Birth: September 28, 1956  Clinical Social Work is seeking post-discharge placement for this patient at the [place] level of care (*CSW will initial, date and re-position this form in  chart as items are completed):  Yes   Patient/family provided with [facility] Work Department's list of facilities offering this level of care within the geographic area requested by the patient (or if unable, by the patient's family).  Yes   Patient/family informed of their freedom to choose among providers that offer the needed level of care, that participate in Medicare, Medicaid or managed care program needed by the patient, have an available bed and are willing to accept the patient.  Yes   Patient/family informed of [facility]'s ownership interest in [facility] and [facility], as well as of the fact that they are under no obligation to receive care at these facilities.  PASRR submitted to EDS on       PASRR number received on       Existing PASRR number confirmed on 08/09/16     FL2 transmitted to all facilities in geographic area requested by pt/family on       FL2 transmitted to all facilities within larger geographic area on 08/02/16     Patient informed that his/her managed care company has contracts with or will negotiate with certain facilities, including the following:  [facility], [facility]     Yes   Patient/family informed of bed offers received.  Patient chooses bed at [facility], [place]     Physician recommends and patient chooses bed at [facility], [place]    Patient to be transferred to [facility], [place] on 08/09/16.  Patient to be transferred to facility by [facility]     Patient family notified on 08/09/16 of transfer.  Name of family member notified:  Sister-Cathy     PHYSICIAN Please prepare  priority discharge summary, including medications, Please prepare prescriptions     Additional Comment:    _______________________________________________ Lia Hopping, LCSW 08/09/2016, 12:32 PM

## 2016-08-09 NOTE — Progress Notes (Signed)
08/09/16 1345  Called report to [facility] [phone]. Report given to [facility].

## 2016-08-09 NOTE — Progress Notes (Signed)
08/09/16  1300  Reviewed discharge instructions with patient and his sister. Both Verbalized understanding of discharge instructions. Copy of discharge instructions and coumadin book given to patient sister.

## 2016-08-17 ENCOUNTER — Telehealth: Payer: Self-pay | Admitting: Vascular Surgery

## 2016-08-17 ENCOUNTER — Other Ambulatory Visit: Payer: Self-pay

## 2016-08-17 DIAGNOSIS — I82402 Acute embolism and thrombosis of unspecified deep veins of left lower extremity: Secondary | ICD-10-CM

## 2016-08-17 NOTE — Telephone Encounter (Signed)
-----   Message from Gregery Na, RN sent at 08/17/2016  1:28 PM EDT ----- Regarding: OV with BCC or VWB Per Dr. Donzetta Matters, [name] needs to be scheduled for Bilateral IVC Iliac Vein Duplex and OV with either Dr. Donzetta Matters tomorrow or Dr. Trula Slade next Monday. Patient has left lower extremity DVT. I have already placed the order in EPIC for the duplex.   Thanks,  Colletta [place]

## 2016-08-17 NOTE — Telephone Encounter (Signed)
Sched appt 10/27; lab at 12:00 and MD at 1:00. Spoke to pt's cousin to inform them of appt.

## 2016-08-18 ENCOUNTER — Encounter (INDEPENDENT_AMBULATORY_CARE_PROVIDER_SITE_OTHER): Payer: Self-pay

## 2016-08-18 ENCOUNTER — Ambulatory Visit (HOSPITAL_COMMUNITY)
Admission: RE | Admit: 2016-08-18 | Discharge: 2016-08-18 | Disposition: A | Discharge status: Home / Self Care | Payer: No Typology Code available for payment source | Source: Ambulatory Visit | Attending: Vascular Surgery | Admitting: Vascular Surgery

## 2016-08-18 ENCOUNTER — Ambulatory Visit (INDEPENDENT_AMBULATORY_CARE_PROVIDER_SITE_OTHER): Payer: Medicare Other | Admitting: Vascular Surgery

## 2016-08-18 ENCOUNTER — Encounter: Payer: Self-pay | Admitting: Vascular Surgery

## 2016-08-18 VITALS — BP 115/62 | HR 72 | Temp 97.4°F | Resp 18 | Ht 70.0 in | Wt 207.0 lb

## 2016-08-18 DIAGNOSIS — I82422 Acute embolism and thrombosis of left iliac vein: Secondary | ICD-10-CM

## 2016-08-18 DIAGNOSIS — I82402 Acute embolism and thrombosis of unspecified deep veins of left lower extremity: Secondary | ICD-10-CM | POA: Diagnosis not present

## 2016-08-18 NOTE — Progress Notes (Signed)
Patient ID: [name], male   DOB: 08-14-1956, 60 y.o.   MRN: [id number]  Reason for Consult: New Evaluation (LLE DVT)   Referred by Raelyn Number, MD  Subjective:     HPI:  [name] is a 60 y.o. male with history of Crohn's disease who is status post colostomy takedown and was found to have left lower extremity swelling with significant left lower extremity DVT. He has no history of a DVT prior to this. He has no family history of DVT. Since discharge from the hospital he has been maintained on Coumadin with a current INR 2.7. He does have persistent swelling of the leg with some discomfort although no frank pain and he is able to ambulate. Swelling is worse at night and is exacerbated with ambulation. It is alleviated by resting and elevation. He has no other issues related to today's visit.  Past Medical History:  Diagnosis Date  . Abscess of anal and rectal regions   . Crohn's disease ([place])   . Epilepsy ([place])    "petit mal; only at home" (6/9//2015)  . Heart murmur   . History of blood transfusion ~ 1992   "related to my Crohn's"  . History of stomach ulcers   . Hyperplastic colon polyp   . Iron deficiency anemia, unspecified   . Mental disability   . Other B-complex deficiencies   . REGIONAL ENTERITIS, LARGE INTESTINE 10/05/2000   Qualifier: Diagnosis of  By: Jerral Ralph    . S/P partial colectomy for Crohn's disease 12/01/2015  . SBO (small bowel obstruction) 12/01/2015  . Syncope 03/31/2014  . Syncope and collapse 03/31/2014   "while driving"   Family History  Problem Relation Age of Onset  . Skin cancer Father   . Colon cancer Mother   . Seizures Neg Hx   . Crohn's disease Neg Hx    Past Surgical History:  Procedure Laterality Date  . COLON SURGERY     Anastomosis 45cm from anal verge c/w deswecending colon anastomosis  . COLONOSCOPY  2013, 2016  . COLOSTOMY TAKEDOWN N/A 08/01/2016   Procedure: LAPAROSCOPIC COLOSTOMY REVERSAL;  Surgeon: Alphonsa Overall, MD;  Location: [facility];  Service: General;  Laterality: N/A;  . [place]   ileocectomy   . HERNIA REPAIR    . LAPAROSCOPY N/A 12/04/2015   Procedure: LAPAROSCOPY DIAGNOSTIC, LAPAROSCOPIC INTEROLYSIS OF ADHESIONS, HAND- ASSISTED SMALL BOWEL RESECTION, MOBILIZATION OF SPLENIC FLEXURE, LEFT COLECTOMY WITH CREATION OF COLOSTOMY (HARTMAN'S PROCEDURE);  Surgeon: Alphonsa Overall, MD;  Location: [facility];  Service: General;  Laterality: N/A;  . PILONIDAL CYST EXCISION  1980's  . TONSILLECTOMY  ~ 1967  . UMBILICAL HERNIA REPAIR  ~ 107    Short Social History:  Social History  Substance Use Topics  . Smoking status: Never Smoker  . Smokeless tobacco: Never Used  . Alcohol use No    No Known Allergies  Current Outpatient Prescriptions  Medication Sig Dispense Refill  . acetaminophen (TYLENOL) 500 MG tablet Take 1,000 mg by mouth at bedtime.    . cyanocobalamin (,VITAMIN B-12,) 1000 MCG/ML injection Inject 1 mL (1,000 mcg total) into the muscle every 30 (thirty) days. 12 mL 0  . diphenhydrAMINE (BENADRYL) 25 mg capsule Take 25 mg by mouth every 4 (four) hours as needed (for allergic reaction).    . furosemide (LASIX) 20 MG tablet Take 20 mg by mouth 2 (two) times daily.    [name] guaifenesin (ROBITUSSIN) 100 MG/5ML syrup Take 200  mg by mouth every 6 (six) hours as needed for cough (Do not exceed 4 doses in 24 hours. If cough has not improved in 24 hours notify physician.).    [name] levETIRAcetam (KEPPRA) 500 MG tablet Take 1 tablet (500 mg total) by mouth 2 (two) times daily. 180 tablet 3  . loratadine (CLARITIN) 10 MG tablet Take 10 mg by mouth daily.    . Multiple Vitamin (DAILY VITE PO) Take 1 tablet by mouth daily.    . ondansetron (ZOFRAN) 4 MG tablet Take 4 mg by mouth every 6 (six) hours as needed for nausea or vomiting.    . potassium chloride 20 MEQ/15ML (10%) SOLN Take 20 mEq by mouth daily.    . Vitamin D, Ergocalciferol, (DRISDOL) 50000 units CAPS capsule Take 50,000 Units by  mouth every 7 (seven) days.    [name] warfarin (COUMADIN) 7.5 MG tablet Take 1 tablet (7.5 mg total) by mouth one time only at 6 PM. 30 tablet 0  . acetaminophen (TYLENOL) 325 MG tablet Take 650 mg by mouth every 6 (six) hours as needed for moderate pain or fever.    . enoxaparin (LOVENOX) 100 MG/ML injection Inject 1.4 mLs (140 mg total) into the skin daily. 5 Syringe 0  . mupirocin ointment (BACTROBAN) 2 % Apply 1 application topically daily. Clean wound on lower abdominal and apply.    . primidone (MYSOLINE) 250 MG tablet Take 2 tablets (500 mg total) by mouth 2 (two) times daily.     Current Facility-Administered Medications  Medication Dose Route Frequency Provider Last Rate Last Dose  . 0.9 %  sodium chloride infusion  500 mL Intravenous Continuous Jerene Bears, MD        Review of Systems  Constitutional:  Constitutional negative. HENT: HENT negative.  Eyes: Eyes negative.  Respiratory: Respiratory negative.  Cardiovascular: Positive for leg swelling.  GI: Negative for blood in stool.  Musculoskeletal: Positive for leg pain.  Neurological: Neurological negative. Hematologic: Hematologic/lymphatic negative.  Psychiatric: Psychiatric negative.        Objective:  Objective   Vitals:   08/18/16 1253  BP: 115/62  Pulse: 72  Resp: 18  Temp: 97.4 F (36.3 C)  SpO2: 100%  Weight: 207 lb (93.9 kg)  Height: 5' 10"  (1.778 m)   Body mass index is 29.7 kg/m.  Physical Exam  Constitutional: He appears well-developed.  HENT:  Head: Normocephalic.  Neck: Normal range of motion.  Cardiovascular: Normal rate.   Pulses:      Popliteal pulses are 2+ on the right side. Left popliteal pulse not accessible.       Dorsalis pedis pulses are 2+ on the right side, and 2+ on the left side.       Posterior tibial pulses are 2+ on the right side, and 2+ on the left side.  Pulmonary/Chest: Effort normal.  Abdominal: Soft. He exhibits no mass.  Musculoskeletal: He exhibits edema.    Neurological: He is alert.  Skin: Skin is warm and dry.  Psychiatric: He has a normal mood and affect. His behavior is normal. Judgment normal.    Data: Right common external iliac veins appear patent. Thrombus is visualized in the distal left common iliac vein extending through the external and the common femoral femoral vein area. IVC appears patent. Exam limited by recent incision.     Assessment/Plan:     This 60 year old white male history of Crohn's disease recently admitted for colostomy takedown and was found to have extensive left lower  extremity DVT. 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 In discussing with the family he would be high risk for any, lysis given recent surgery and his symptoms at this time do not merit the risk of intervention. I have recommended continued ambulation and Coumadin and have prescribed compression stockings thigh-high today. 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 I will have him follow up in 3 months with repeat IVC iliac vein duplex and we can track his course of left lower extremity swelling and consider intervention for his iliac vein occlusion at that time should symptoms merit. All questions were answered.     Waynetta Sandy MD [facility]

## 2016-09-01 ENCOUNTER — Other Ambulatory Visit: Payer: Self-pay | Admitting: *Deleted

## 2016-09-01 DIAGNOSIS — Z95828 Presence of other vascular implants and grafts: Secondary | ICD-10-CM

## 2016-09-01 NOTE — Addendum Note (Signed)
Addended by: Mena Goes on: 09/01/2016 12:54 PM   Modules accepted: Orders

## 2016-09-25 ENCOUNTER — Ambulatory Visit: Payer: Medicare Other | Admitting: Neurology

## 2016-09-27 ENCOUNTER — Ambulatory Visit: Payer: Medicare Other | Admitting: Neurology

## 2016-09-29 ENCOUNTER — Ambulatory Visit: Payer: Medicare Other | Admitting: Neurology

## 2016-11-20 ENCOUNTER — Encounter: Payer: Self-pay | Admitting: Vascular Surgery

## 2016-11-24 ENCOUNTER — Encounter: Payer: Self-pay | Admitting: Vascular Surgery

## 2016-11-24 ENCOUNTER — Ambulatory Visit (HOSPITAL_COMMUNITY)
Admission: RE | Admit: 2016-11-24 | Discharge: 2016-11-24 | Disposition: A | Discharge status: Home / Self Care | Payer: Medicare Other | Source: Ambulatory Visit | Attending: Vascular Surgery | Admitting: Vascular Surgery

## 2016-11-24 ENCOUNTER — Other Ambulatory Visit: Payer: Self-pay

## 2016-11-24 ENCOUNTER — Ambulatory Visit (INDEPENDENT_AMBULATORY_CARE_PROVIDER_SITE_OTHER): Payer: Medicare Other | Admitting: Vascular Surgery

## 2016-11-24 ENCOUNTER — Telehealth: Payer: Self-pay | Admitting: Neurology

## 2016-11-24 VITALS — BP 134/71 | HR 70 | Temp 98.0°F | Resp 18 | Ht 70.0 in | Wt 208.0 lb

## 2016-11-24 DIAGNOSIS — I82422 Acute embolism and thrombosis of left iliac vein: Secondary | ICD-10-CM

## 2016-11-24 DIAGNOSIS — G40009 Localization-related (focal) (partial) idiopathic epilepsy and epileptic syndromes with seizures of localized onset, not intractable, without status epilepticus: Secondary | ICD-10-CM

## 2016-11-24 MED ORDER — PRIMIDONE 250 MG PO TABS: 500.0000 mg | ORAL_TABLET | Freq: Two times a day (BID) | ORAL | 3 refills | Status: DC

## 2016-11-24 MED ORDER — LEVETIRACETAM 500 MG PO TABS: 500.0000 mg | ORAL_TABLET | Freq: Two times a day (BID) | ORAL | 3 refills | Status: DC

## 2016-11-24 NOTE — Telephone Encounter (Signed)
Sister states the just changed it and took him off keppra while in rehab facility. She wants him to be on same doses as last visit.

## 2016-11-24 NOTE — Addendum Note (Signed)
Addended by: Lianne Cure A on: 11/24/2016 02:33 PM   Modules accepted: Orders

## 2016-11-24 NOTE — Progress Notes (Signed)
Patient ID: [name], male   DOB: 1956/05/27, 61 y.o.   MRN: [id number]  Reason for Consult: Re-evaluation   Referred by Raelyn Number, MD  Subjective:     HPI:  [name] is a 61 y.o. male follows up with history of extensive left lower extremity DVT that was iliofemoral. He is now maintained on Coumadin. He denies significant swelling and is occasionally wearing his thigh-high compression stockings. He does not have ulceration or varicosities in his left lower extremity at this time. He is back to walking. He maintains on Coumadin does not take other blood thinners or aspirin. He has Crohn's disease and is followed closely for this. He has complaints about the thigh-high stockings in her discomfort. He is otherwise been fine since our last evaluation without any new pain or swelling in his left lower extremity.  Past Medical History:  Diagnosis Date  . Abscess of anal and rectal regions   . Crohn's disease ([place])   . Epilepsy ([place])    "petit mal; only at home" (6/9//2015)  . Heart murmur   . History of blood transfusion ~ 1992   "related to my Crohn's"  . History of stomach ulcers   . Hyperplastic colon polyp   . Iron deficiency anemia, unspecified   . Mental disability   . Other B-complex deficiencies   . REGIONAL ENTERITIS, LARGE INTESTINE 10/05/2000   Qualifier: Diagnosis of  By: Jerral Ralph    . S/P partial colectomy for Crohn's disease 12/01/2015  . SBO (small bowel obstruction) 12/01/2015  . Syncope 03/31/2014  . Syncope and collapse 03/31/2014   "while driving"   Family History  Problem Relation Age of Onset  . Skin cancer Father   . Colon cancer Mother   . Seizures Neg Hx   . Crohn's disease Neg Hx    Past Surgical History:  Procedure Laterality Date  . COLON SURGERY     Anastomosis 45cm from anal verge c/w deswecending colon anastomosis  . COLONOSCOPY  2013, 2016  . COLOSTOMY TAKEDOWN N/A 08/01/2016   Procedure: LAPAROSCOPIC COLOSTOMY  REVERSAL;  Surgeon: Alphonsa Overall, MD;  Location: [facility];  Service: General;  Laterality: N/A;  . [place]   ileocectomy   . HERNIA REPAIR    . LAPAROSCOPY N/A 12/04/2015   Procedure: LAPAROSCOPY DIAGNOSTIC, LAPAROSCOPIC INTEROLYSIS OF ADHESIONS, HAND- ASSISTED SMALL BOWEL RESECTION, MOBILIZATION OF SPLENIC FLEXURE, LEFT COLECTOMY WITH CREATION OF COLOSTOMY (HARTMAN'S PROCEDURE);  Surgeon: Alphonsa Overall, MD;  Location: [facility];  Service: General;  Laterality: N/A;  . PILONIDAL CYST EXCISION  1980's  . TONSILLECTOMY  ~ 1967  . UMBILICAL HERNIA REPAIR  ~ 40    Short Social History:  Social History  Substance Use Topics  . Smoking status: Never Smoker  . Smokeless tobacco: Never Used  . Alcohol use No    No Known Allergies  Current Outpatient Prescriptions  Medication Sig Dispense Refill  . acetaminophen (TYLENOL) 500 MG tablet Take 1,000 mg by mouth at bedtime.    . cyanocobalamin (,VITAMIN B-12,) 1000 MCG/ML injection Inject 1 mL (1,000 mcg total) into the muscle every 30 (thirty) days. 12 mL 0  . diphenhydrAMINE (BENADRYL) 25 mg capsule Take 25 mg by mouth every 4 (four) hours as needed (for allergic reaction).    . furosemide (LASIX) 20 MG tablet Take 20 mg by mouth 2 (two) times daily.    [name] ipratropium (ATROVENT) 0.03 % nasal spray     . levETIRAcetam (KEPPRA)  500 MG tablet Take 1 tablet (500 mg total) by mouth 2 (two) times daily. 180 tablet 3  . loratadine (CLARITIN) 10 MG tablet Take 10 mg by mouth daily.    . meclizine (ANTIVERT) 25 MG tablet     . Multiple Vitamin (DAILY VITE PO) Take 1 tablet by mouth daily.    . ondansetron (ZOFRAN) 4 MG tablet Take 4 mg by mouth every 6 (six) hours as needed for nausea or vomiting.    . potassium chloride 20 MEQ/15ML (10%) SOLN Take 20 mEq by mouth daily.    . primidone (MYSOLINE) 250 MG tablet Take 2 tablets (500 mg total) by mouth 2 (two) times daily.    . Vitamin D, Ergocalciferol, (DRISDOL) 50000 units CAPS capsule Take  50,000 Units by mouth every 7 (seven) days.    [name] warfarin (COUMADIN) 5 MG tablet     . acetaminophen (TYLENOL) 325 MG tablet Take 650 mg by mouth every 6 (six) hours as needed for moderate pain or fever.    . enoxaparin (LOVENOX) 100 MG/ML injection Inject 1.4 mLs (140 mg total) into the skin daily. 5 Syringe 0  . guaifenesin (ROBITUSSIN) 100 MG/5ML syrup Take 200 mg by mouth every 6 (six) hours as needed for cough (Do not exceed 4 doses in 24 hours. If cough has not improved in 24 hours notify physician.).    [name] mupirocin ointment (BACTROBAN) 2 % Apply 1 application topically daily. Clean wound on lower abdominal and apply.    . warfarin (COUMADIN) 4 MG tablet     . warfarin (COUMADIN) 7.5 MG tablet Take 1 tablet (7.5 mg total) by mouth one time only at 6 PM. (Patient not taking: Reported on 11/24/2016) 30 tablet 0   Current Facility-Administered Medications  Medication Dose Route Frequency Provider Last Rate Last Dose  . 0.9 %  sodium chloride infusion  500 mL Intravenous Continuous Jerene Bears, MD        Review of Systems  Constitutional:  Constitutional negative. Eyes: Eyes negative.  Respiratory: Respiratory negative.  Cardiovascular: Cardiovascular negative.  GI: Positive for abdominal pain.  Musculoskeletal: Musculoskeletal negative.  Skin: Skin negative.  Neurological: Neurological negative. Hematologic: Hematologic/lymphatic negative.  Psychiatric: Psychiatric negative.        Objective:  Objective   Vitals:   11/24/16 0959  BP: 134/71  Pulse: 70  Resp: 18  Temp: 98 F (36.7 C)  SpO2: 98%  Weight: 208 lb (94.3 kg)  Height: 5' 10"  (1.778 m)   Body mass index is 29.84 kg/m.  Physical Exam  Constitutional: He is oriented to person, place, and time. He appears well-developed.  HENT:  Head: Normocephalic.  Eyes: Pupils are equal, round, and reactive to light.  Cardiovascular: Normal rate.   Pulses:      Radial pulses are 2+ on the right side, and 2+ on the left  side.       Femoral pulses are 2+ on the right side, and 2+ on the left side.      Popliteal pulses are 2+ on the right side, and 2+ on the left side.  Pulmonary/Chest: Effort normal.  Abdominal: Soft. He exhibits no mass.  Musculoskeletal: He exhibits no edema.  Neurological: He is alert and oriented to person, place, and time.  Skin: Skin is warm and dry.  Psychiatric: He has a normal mood and affect. His behavior is normal. Judgment and thought content normal.    Data: Right common femoral common iliac and external iliac veins appear  patent. There is residual thrombus visualized in the left distal external iliac vein as well as left common femoral vein but there appears to be recanalized flow IVC appears patent.     Assessment/Plan:     61 year old male presents for follow-up of extensive left lower extremity DVT that occurred following colostomy takedown. 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 We have discussed his risk factor surgery is over but that he still likely has common iliac vein at least stenosis that has apparently recanalized flow through the residual thrombus. Because of this we discussed the risk and benefits and he can likely stop Coumadin after a period of total 6 months which will be May 1. I've asked that he transitions to full dose aspirin at that time. Any stoppage and Coumadin before that would require Lovenox bridge. I'll plan to see him back in 1 year with repeat IVC iliac vein duplex at which time he can likely be discharged from my office if he has no further symptoms. We discussed that any surgeries in the future would likely require bridging with Lovenox therapy to prevent any DVT. When he stops Coumadin if he has any issues he will call see me sooner. We will also get him knee-high compression stockings as he dislikes the thigh-high today. All questions were answered in the presence of his family.     Waynetta Sandy  MD [facility]

## 2016-11-24 NOTE — Telephone Encounter (Signed)
The last time I saw him he was on Keppra 530m BID and Primidone 2513m2 tabs BID. Are we sending the same prescription or the new one with Primidone 25026mID? Why was it reduced? Thanks

## 2016-11-24 NOTE — Telephone Encounter (Signed)
Patient POA [name] [phone]  needs  To talk to some medication for her brother. She is in [place] so please be aware of the time difference

## 2016-11-24 NOTE — Telephone Encounter (Signed)
Patients sister called and asked if the Keppra and Primidone Rxs  can be faxed as stated below. Please advise.

## 2016-11-24 NOTE — Telephone Encounter (Signed)
Patient 's sister called [place] 11/23/16 at 10:08 Pm. Initial Comment: Brother has been having dizzy spells. Declined triage. Afton was taking medication as prescribed by Dr. Delice Lesch and the rehab center that he was at changed it to Premidone 255m once in morning and once at night. [facility] skilled [facility] needs the prescription as written by the Dr. To be faxed to [phone]([place] regular phone number is [phone] [place] back Friday but be aware of time change.

## 2016-12-05 ENCOUNTER — Encounter: Payer: Self-pay | Admitting: Neurology

## 2016-12-05 ENCOUNTER — Ambulatory Visit (INDEPENDENT_AMBULATORY_CARE_PROVIDER_SITE_OTHER): Payer: Medicare Other | Admitting: Neurology

## 2016-12-05 VITALS — BP 128/80 | HR 85 | Ht 70.0 in | Wt 214.1 lb

## 2016-12-05 DIAGNOSIS — G40009 Localization-related (focal) (partial) idiopathic epilepsy and epileptic syndromes with seizures of localized onset, not intractable, without status epilepticus: Secondary | ICD-10-CM
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
## 2016-12-06 ENCOUNTER — Encounter: Payer: Self-pay | Admitting: *Deleted

## 2016-12-25 ENCOUNTER — Encounter (INDEPENDENT_AMBULATORY_CARE_PROVIDER_SITE_OTHER): Payer: Self-pay

## 2016-12-25 ENCOUNTER — Ambulatory Visit (INDEPENDENT_AMBULATORY_CARE_PROVIDER_SITE_OTHER): Payer: Medicare Other | Admitting: Internal Medicine

## 2016-12-25 ENCOUNTER — Encounter: Payer: Self-pay | Admitting: Internal Medicine

## 2016-12-25 VITALS — BP 122/56 | HR 80 | Ht 69.0 in | Wt 218.2 lb

## 2016-12-25 DIAGNOSIS — E538 Deficiency of other specified B group vitamins: Secondary | ICD-10-CM

## 2016-12-25 DIAGNOSIS — K5080 Crohn's disease of both small and large intestine without complications: Secondary | ICD-10-CM

## 2016-12-25 NOTE — Progress Notes (Signed)
Subjective:    Patient ID: [name], male    DOB: 11-Dec-1955, 61 y.o.   MRN: [id number]  HPI Miles Leyda is a 61 year old male with a history of ileocolonic Crohn's disease diagnosed in 1985 status post right hemicolectomy with known Fistulizing and stricturing disease who is here for follow-up. He also had a history of laparoscopic lysis of adhesions, small bowel resection left hemicolectomy with transverse colon and colostomy status post takedown last year with Dr. Lucia Gaskins. This was complicated by wound infection. Also complicated by lower extremity DVT. He remains on warfarin. He is here today with his aunt and her husband.  He came for colonoscopy performed both per rectum and through his ostomy in October. This showed diversion colitis in the rectal stump. The colon through the ostomy appeared normal though there was mild stenosis at the ileocolonic anastomosis which was traversed. Approximately 30 cm of the neoterminal ileum was examined and appeared normal. There was no evidence of active Crohn's disease.  He reports that he has been doing very well. He has been cleared by Dr. Lucia Gaskins. He has been eating great and gaining weight. No nausea or vomiting. No diarrhea or constipation. No abdominal pain. No blood in his stool or melena.  He has plans to be on Coumadin until likely 02/20/2017 at which point he will likely be switched to aspirin for his history of extensive lower extremity DVT.   Review of Systems As per HPI, otherwise negative  Current Medications, Allergies, Past Medical History, Past Surgical History, Family History and Social History were reviewed in [employer] record.     Objective:   Physical Exam BP (!) 122/56 (BP Location: Left Arm, Patient Position: Sitting, Cuff Size: Normal)   Pulse 80   Ht 5' 9"  (1.753 m)   Wt 218 lb 4 oz (99 kg)   BMI 32.23 kg/m  Constitutional: Well-developed and well-nourished. No distress. HEENT:  Normocephalic and atraumatic.  Conjunctivae are normal.  No scleral icterus.  Neck: Neck supple. Trachea midline. Cardiovascular: Normal rate, regular rhythm and intact distal pulses.  Pulmonary/chest: Effort normal and breath sounds normal. No wheezing, rales or rhonchi. Abdominal: Soft, nontender, nondistended. Bowel sounds active throughout. Multiple well-healed abd incisions with scar tissue Extremities: no clubbing, cyanosis, 2+ edema Neurological: Alert and oriented to person place and time. Skin: Skin is warm and dry.  Psychiatric: Normal mood and affect. Behavior is normal.     Assessment & Plan:  61 year old male with a history of ileocolonic Crohn's disease diagnosed in 1985 status post right hemicolectomy with known Fistulizing and stricturing disease who is here for follow-up.  1. 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 We had previously discussed adding biologic therapy given his history of Crohn's disease. We discussed this again at length today including the risk, benefits and alternatives. Now that he has had additional surgery and there is no evidence of current or active disease, it is difficult to know what his true recurrence risk is. After this discussion both he and I feel that observation is the best course of action. I would like to repeat colonoscopy in October 2018 for surveillance. If there is evidence of mucosal disease at this time then I would recommend initiation of biologic therapy. Both he and his family are comfortable and in agreement with this plan of action.  2. B12 def -- continue B12  IM monthly  25 minutes spent with the patient today. Greater than 50% was spent in counseling and coordination of care with the patient

## 2016-12-25 NOTE — Patient Instructions (Signed)
We will send you a reminder when it's time to schedule your recall colonoscopy.  Return to see Dr Hilarie Fredrickson as needed.

## 2017-05-16 DIAGNOSIS — L603 Nail dystrophy: Secondary | ICD-10-CM | POA: Insufficient documentation

## 2017-05-16 DIAGNOSIS — B351 Tinea unguium: Secondary | ICD-10-CM | POA: Insufficient documentation

## 2017-05-24 ENCOUNTER — Telehealth: Payer: Self-pay | Admitting: Internal Medicine

## 2017-05-24 NOTE — Telephone Encounter (Signed)
Should see [facility] due to coumadin.

## 2017-05-25 NOTE — Telephone Encounter (Signed)
Left message for cousin [name] to call back and sch ov with PA due to patient being on coumadin

## 2017-06-06 ENCOUNTER — Encounter: Payer: Self-pay | Admitting: Physician Assistant

## 2017-06-06 ENCOUNTER — Ambulatory Visit (INDEPENDENT_AMBULATORY_CARE_PROVIDER_SITE_OTHER): Payer: Medicare Other | Admitting: Physician Assistant

## 2017-06-06 ENCOUNTER — Telehealth: Payer: Self-pay | Admitting: Emergency Medicine

## 2017-06-06 VITALS — BP 120/54 | HR 72 | Ht 69.0 in | Wt 226.5 lb

## 2017-06-06 DIAGNOSIS — K5080 Crohn's disease of both small and large intestine without complications: Secondary | ICD-10-CM | POA: Diagnosis not present

## 2017-06-06 MED ORDER — NA SULFATE-K SULFATE-MG SULF 17.5-3.13-1.6 GM/177ML PO SOLN: 1.0000 | ORAL | 0 refills | Status: DC

## 2017-06-06 NOTE — Patient Instructions (Signed)

## 2017-06-06 NOTE — Progress Notes (Addendum)
Chief Complaint: Crohn's disease of both small and large intestine  HPI:  [name] is a 61 year old male  with history of ileocolonic Crohn's disease diagnosed in 1985 status post right hemicolectomy with known fistulizing and stricturing disease. He also had a history of laparoscopic lysis of adhesions with small bowel resection, left hemicolectomy at transverse colon and colostomy status post take down in 2017 with Dr. Lucia Gaskins. This was complicated by wound infection. Also complicated by lower extremity DVT for which he remains on warfarin. He is here today to discuss a staging/surveillance colonoscopy.   The patient tells me that he has been doing well since seen [name] earlier this year. He is aware of plans for a repeat colonoscopy at this time for staging. His cousin is with him and tells me that he is still on Coumadin, though they were certain that he should be able to come off of this medication by now. Again this was for DVT from complication after surgery. Patient denies any new complaints or concerns   Patient denies fever, chills, blood in his stool, melena, weight loss, fatigue, nausea, vomiting or abdominal pain.  Past Medical History:  Diagnosis Date  . Abscess of anal and rectal regions   . Crohn's disease ([place])   . DVT (deep venous thrombosis) ([place])   . Epilepsy ([place])    "petit mal; only at home" (6/9//2015)  . Heart murmur   . History of blood transfusion ~ 1992   "related to my Crohn's"  . History of stomach ulcers   . Hyperplastic colon polyp   . Iron deficiency anemia, unspecified   . Mental disability   . Other B-complex deficiencies   . REGIONAL ENTERITIS, LARGE INTESTINE 10/05/2000   Qualifier: Diagnosis of  By: Jerral Ralph    . S/P partial colectomy for Crohn's disease 12/01/2015  . SBO (small bowel obstruction) ([place]) 12/01/2015  . Syncope 03/31/2014  . Syncope and collapse 03/31/2014   "while driving"    Past Surgical History:  Procedure  Laterality Date  . COLON SURGERY     Anastomosis 45cm from anal verge c/w deswecending colon anastomosis  . COLONOSCOPY  2013, 2016  . COLOSTOMY TAKEDOWN N/A 08/01/2016   Procedure: LAPAROSCOPIC COLOSTOMY REVERSAL;  Surgeon: Alphonsa Overall, MD;  Location: [facility];  Service: General;  Laterality: N/A;  . [place]   ileocectomy   . HERNIA REPAIR    . LAPAROSCOPY N/A 12/04/2015   Procedure: LAPAROSCOPY DIAGNOSTIC, LAPAROSCOPIC INTEROLYSIS OF ADHESIONS, HAND- ASSISTED SMALL BOWEL RESECTION, MOBILIZATION OF SPLENIC FLEXURE, LEFT COLECTOMY WITH CREATION OF COLOSTOMY (HARTMAN'S PROCEDURE);  Surgeon: Alphonsa Overall, MD;  Location: [facility];  Service: General;  Laterality: N/A;  . PILONIDAL CYST EXCISION  1980's  . TONSILLECTOMY  ~ 1967  . UMBILICAL HERNIA REPAIR  ~ 1983    Current Outpatient Prescriptions  Medication Sig Dispense Refill  . acetaminophen (TYLENOL) 325 MG tablet Take 650 mg by mouth every 6 (six) hours as needed for moderate pain or fever.    [name] acetaminophen (TYLENOL) 500 MG tablet Take 1,000 mg by mouth at bedtime.    . cyanocobalamin (,VITAMIN B-12,) 1000 MCG/ML injection Inject 1 mL (1,000 mcg total) into the muscle every 30 (thirty) days. 12 mL 0  . diphenhydrAMINE (BENADRYL) 25 mg capsule Take 25 mg by mouth every 4 (four) hours as needed (for allergic reaction).    . furosemide (LASIX) 20 MG tablet Take 20 mg by mouth 2 (two) times daily.    [name]  guaifenesin (ROBITUSSIN) 100 MG/5ML syrup Take 200 mg by mouth every 6 (six) hours as needed for cough (Do not exceed 4 doses in 24 hours. If cough has not improved in 24 hours notify physician.).    [name] ipratropium (ATROVENT) 0.03 % nasal spray Place 1 spray into both nostrils 2 (two) times daily.     [name] levETIRAcetam (KEPPRA) 500 MG tablet Take 1 tablet (500 mg total) by mouth 2 (two) times daily. 180 tablet 3  . loperamide (IMODIUM A-D) 2 MG tablet Take 2 mg by mouth 4 (four) times daily as needed for diarrhea or loose stools. With  each loose stool up to 8 doses in 24 hours    . loratadine (CLARITIN) 10 MG tablet Take 10 mg by mouth daily.    . meclizine (ANTIVERT) 25 MG tablet Take 25 mg by mouth every 6 (six) hours as needed.     . Multiple Vitamin (DAILY VITE PO) Take 1 tablet by mouth daily.    . ondansetron (ZOFRAN) 4 MG tablet Take 4 mg by mouth every 6 (six) hours as needed for nausea or vomiting.    . potassium chloride 20 MEQ/15ML (10%) SOLN Take 20 mEq by mouth daily.    . primidone (MYSOLINE) 250 MG tablet Take 2 tablets (500 mg total) by mouth 2 (two) times daily. 120 tablet 3  . Vitamin D, Ergocalciferol, (DRISDOL) 50000 units CAPS capsule Take 50,000 Units by mouth every 7 (seven) days.    [name] warfarin (COUMADIN) 5 MG tablet Take 5 mg by mouth as directed.      Current Facility-Administered Medications  Medication Dose Route Frequency Provider Last Rate Last Dose  . 0.9 %  sodium chloride infusion  500 mL Intravenous Continuous Pyrtle, Lajuan Lines, MD        Allergies as of 06/06/2017  . (No Known Allergies)    Family History  Problem Relation Age of Onset  . Skin cancer Father   . Colon cancer Mother   . Seizures Neg Hx   . Crohn's disease Neg Hx     Social History   Social History  . Marital status: Single    Spouse name: N/A  . Number of children: 0  . Years of education: N/A   Occupational History  . Disabled    Social History Main Topics  . Smoking status: Never Smoker  . Smokeless tobacco: Never Used  . Alcohol use No  . Drug use: No  . Sexual activity: No   Other Topics Concern  . Not on file   Social History Narrative   Lives at [place]    Review of Systems:    Constitutional: No weight loss, fever or chills Cardiovascular: No chest pain Respiratory: No SOB  Gastrointestinal: See HPI and otherwise negative   Physical Exam:  Vital signs: BP (!) 120/54 (BP Location: Left Arm, Patient Position: Sitting, Cuff Size: Normal)   Pulse 72   Ht 5' 9"  (1.753 m)   Wt 226 lb 8  oz (102.7 kg)   BMI 33.45 kg/m   Constitutional:   Pleasant pale appearing Caucasian male appears to be in NAD, Well developed, Well nourished, alert and cooperative Respiratory: Respirations even and unlabored. Lungs clear to auscultation bilaterally.   No wheezes, crackles, or rhonchi.  Cardiovascular: Normal S1, S2. No MRG. Regular rate and rhythm. No peripheral edema, cyanosis or pallor.  Gastrointestinal:  Soft, nondistended, nontender. No rebound or guarding. Normal bowel sounds. No appreciable masses or hepatomegaly. Psychiatric: Demonstrates good  judgement and reason without abnormal affect or behaviors.  No recent labs or imaging.  Assessment: 1. Ileocolonic Crohn's disease: Diagnosed in 1985 status post right hemicolectomy with known fistulizing and stricturing disease, due for a staging/surveillance colonoscopy  Plan: 1. See Dr. Vena Rua note 12/25/16. It was recommended patient have a colonoscopy this October for surveillance and further discussion of possible biologic therapy. 2. Patient was told to hold his Coumadin for 7 days prior to his procedure. We will communicate with Dr. Donzetta Matters in regards to this to ensure that this is acceptable for him. 3. Scheduled patient for a colonoscopy with [name] in the [facility]. Did discuss risks, benefits, limitations and alternatives and patient agrees to proceed. 4. Patient to return to clinic per Dr. Vena Rua recommendations after time of procedure.  Ellouise Newer, PA-C [facility] 06/06/2017, 11:09 AM  Cc: Raelyn Number, MD   Addendum: Reviewed and agree with management. Pyrtle, Lajuan Lines, MD

## 2017-06-06 NOTE — Telephone Encounter (Signed)
RE: [name] DOB: 09-22-1956 MRN: [id number]   Dear Dr Donzetta Matters,    We have scheduled the above patient for an endoscopic procedure. Our records show that he is on anticoagulation therapy.   Please advise as to how long the patient may come off his therapy of coumadin prior to the procedure, which is scheduled for 08/02/17.  Please fax back/ or route the completed form to [employer]  at [phone].   Sincerely,    Tinnie Gens P

## 2017-06-19 NOTE — Telephone Encounter (Signed)
He can really be off anticoagulation as long as they need to safely perform the procedure. Preferably he would stay on aspirin throughout.   Erlene Quan

## 2017-06-19 NOTE — Telephone Encounter (Signed)
[name],   please see attached note from Dr. Donzetta Matters and advise.   Thank you

## 2017-06-19 NOTE — Telephone Encounter (Signed)
Hold warfarin 5 days before procedure [name] can be continued uninterrupted Will need his warfarin clinic to be involved post procedure to monitor INR as he resumes anticoagulation

## 2017-06-20 NOTE — Telephone Encounter (Signed)
Spoke to patients POA his sister Franne Grip, and informed her to hold Coumadin 5 days before she stated Dr. Jannette Fogo just discontinued his Coumadin due to his INR levels. I informed her to let us know if it changes because he will need to be off it for any procedures.

## 2017-07-24 ENCOUNTER — Encounter: Payer: Self-pay | Admitting: Internal Medicine

## 2017-08-02 ENCOUNTER — Ambulatory Visit (AMBULATORY_SURGERY_CENTER): Payer: Medicare Other | Admitting: Internal Medicine

## 2017-08-02 ENCOUNTER — Encounter: Payer: Medicare Other | Admitting: Internal Medicine

## 2017-08-02 ENCOUNTER — Encounter: Payer: Self-pay | Admitting: Internal Medicine

## 2017-08-02 VITALS — BP 123/60 | HR 65 | Temp 98.0°F | Resp 11 | Ht 69.0 in | Wt 226.0 lb

## 2017-08-02 DIAGNOSIS — K5080 Crohn's disease of both small and large intestine without complications: Secondary | ICD-10-CM | POA: Diagnosis not present

## 2017-08-02 DIAGNOSIS — K5190 Ulcerative colitis, unspecified, without complications: Secondary | ICD-10-CM | POA: Diagnosis not present

## 2017-08-02 MED ORDER — SODIUM CHLORIDE 0.9 % IV SOLN: 500.0000 mL | INTRAVENOUS | Status: DC

## 2017-08-02 NOTE — Progress Notes (Signed)
Pt's states no medical or surgical changes since previsit or office visit. Patient is off Coumadin. Stopped and will not restart as of 2 weeks ago.

## 2017-08-02 NOTE — Progress Notes (Signed)
Called to room to assist during endoscopic procedure.  Patient ID and intended procedure confirmed with present staff. Received instructions for my participation in the procedure from the performing physician.  

## 2017-08-02 NOTE — Op Note (Signed)
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 The patient's tolerance of                            previous anesthesia was also reviewed. The risks                            and benefits of the procedure and the sedation                            options and risks were discussed with the patient.                            All questions were answered, and informed consent                            was obtained. Prior Anticoagulants: The patient has                            taken Coumadin (warfarin), last dose was 7 days  prior to procedure. ASA Grade Assessment: III - A                            patient with severe systemic disease. After                            reviewing the risks and benefits, the patient was                            deemed in satisfactory condition to undergo the                            procedure.                           After obtaining informed consent, the colonoscope                            was passed  under direct vision. Throughout the                            procedure, the patient's blood pressure, pulse, and                            oxygen saturations were monitored continuously. The                            Model [id number]-[id number]) scope was introduced                            through the anus and advanced to the the terminal                            ileum. The colonoscopy was performed without                            difficulty. The patient tolerated the procedure                            well. The quality of the bowel preparation was                            good. The terminal ileum, ileocecal valve,                            appendiceal orifice, and rectum were photographed. Scope In: 10:45:59 AM Scope Out: 10:56:55 AM Scope Withdrawal Time: 0 hours 9 minutes 27 seconds  Total Procedure Duration: 0 hours 10 minutes 56 seconds  Findings:                 The neo-terminal ileum appeared normal.                           There was evidence of a prior end-to-end  ileo-colonic anastomosis in the ascending colon.                            This was patent and was characterized by healthy                            appearing mucosa. The anastomosis was traversed.                           Localized mild inflammation characterized by                            erosions, erythema and pseudopolyps was found in                            the sigmoid colon around 35 cm from the dentate                            line This is likely at the colo-colonic                            anastomosis. Biopsies were taken with a cold                            forceps for histology. If this represents active                            Crohn's it is mild and very short segment/focal.                           Normal mucosa was found in the rectum, in the                            recto-sigmoid colon, in the remaining sigmoid                             colon, in the transverse colon and in the ascending                            colon. Four biopsies were taken every 10 cm with a                            cold forceps from the entire colon for Crohn's                            disease surveillance and dysplasia surveillance.                            These biopsy specimens from the right colon, left                            colon and sigmoid colon were sent to Pathology.  The retroflexed view of the distal rectum and anal                            verge was normal and showed no anal or rectal                            abnormalities. Complications:            No immediate complications. Estimated Blood Loss:     Estimated blood loss was minimal. Impression:               - The examined portion of the ileum was normal.                           - Patent end-to-end ileo-colonic anastomosis,                            characterized by healthy appearing mucosa.                           - Localized mild inflammation was found in the                            sigmoid colon at colocolonic anastomosis, possible                            secondary to focal Crohn's disease. Biopsied.                           - Normal mucosa in the rectum, in the recto-sigmoid                            colon, in the sigmoid colon, in the transverse                            colon and in the ascending colon. Biopsied. Recommendation:           - Patient has a contact number available for                            emergencies. The signs and symptoms of potential                            delayed complications were discussed with the                            patient. Return to normal activities tomorrow.                            Written discharge instructions were provided to the                            patient.                           - Resume  previous diet.                           - Continue present medications.                            - Await pathology results.                           - Repeat colonoscopy is recommended for                            surveillance. The colonoscopy date will be                            determined after pathology results from today's                            exam become available for review.                           - Resume Coumadin (warfarin) at prior dose today.                            Refer to managing physician for further adjustment                            of therapy. Jerene Bears, MD 08/02/2017 11:09:25 AM This report has been signed electronically.

## 2017-08-02 NOTE — Progress Notes (Signed)
A/ox3 pleased with MAC, report to Jane RN 

## 2017-08-02 NOTE — Patient Instructions (Signed)
Impression/Recommendations:  Resume previous diet. Continue present medications.  Repeat colonoscopy recommended for surveillance.  Date to be determined after pathology results reviewed.  Resume Coumadin (warfarin) at prior dose today.  YOU HAD AN ENDOSCOPIC PROCEDURE TODAY AT [facility]:   Refer to the procedure report that was given to you for any specific questions about what was found during the examination.  If the procedure report does not answer your questions, please call your gastroenterologist to clarify.  If you requested that your care partner not be given the details of your procedure findings, then the procedure report has been included in a sealed envelope for you to review at your convenience later.  YOU SHOULD EXPECT: Some feelings of bloating in the abdomen. Passage of more gas than usual.  Walking can help get rid of the air that was put into your GI tract during the procedure and reduce the bloating. If you had a lower endoscopy (such as a colonoscopy or flexible sigmoidoscopy) you may notice spotting of blood in your stool or on the toilet paper. If you underwent a bowel prep for your procedure, you may not have a normal bowel movement for a few days.  Please Note:  You might notice some irritation and congestion in your nose or some drainage.  This is from the oxygen used during your procedure.  There is no need for concern and it should clear up in a day or so.  SYMPTOMS TO REPORT IMMEDIATELY:   Following lower endoscopy (colonoscopy or flexible sigmoidoscopy):  Excessive amounts of blood in the stool  Significant tenderness or worsening of abdominal pains  Swelling of the abdomen that is new, acute  Fever of 100F or higher For urgent or emergent issues, a gastroenterologist can be reached at any hour by calling [phone].   DIET:  We do recommend a small meal at first, but then you may proceed to your regular diet.  Drink plenty of fluids but  you should avoid alcoholic beverages for 24 hours.  ACTIVITY:  You should plan to take it easy for the rest of today and you should NOT DRIVE or use heavy machinery until tomorrow (because of the sedation medicines used during the test).    FOLLOW UP: Our staff will call the number listed on your records the next business day following your procedure to check on you and address any questions or concerns that you may have regarding the information given to you following your procedure. If we do not reach you, we will leave a message.  However, if you are feeling well and you are not experiencing any problems, there is no need to return our call.  We will assume that you have returned to your regular daily activities without incident.  If any biopsies were taken you will be contacted by phone or by letter within the next 1-3 weeks.  Please call us at [phone] if you have not heard about the biopsies in 3 weeks.    SIGNATURES/CONFIDENTIALITY: You and/or your care partner have signed paperwork which will be entered into your electronic medical record.  These signatures attest to the fact that that the information above on your After Visit Summary has been reviewed and is understood.  Full responsibility of the confidentiality of this discharge information lies with you and/or your care-partner.

## 2017-08-03 ENCOUNTER — Telehealth: Payer: Self-pay

## 2017-08-03 NOTE — Telephone Encounter (Signed)
Attempted to reach patient for post-procedure f/u call. No answer. Left message to please don't hesitate to call us if she has any questions/concerns regarding her care.

## 2017-08-06 ENCOUNTER — Telehealth: Payer: Self-pay | Admitting: *Deleted

## 2017-08-06 NOTE — Telephone Encounter (Signed)
  Follow up Call-  Call back number 08/02/2017 07/26/2016  Post procedure Call Back phone  # [phone],[phone]-COUSIN NUMBER OK TO SPEAK WITH HER.  Permission to leave phone message Yes Yes  Some recent data might be hidden    [facility]

## 2017-08-07 ENCOUNTER — Other Ambulatory Visit: Payer: Self-pay

## 2017-08-07 MED ORDER — MESALAMINE 1.2 G PO TBEC: 2.4000 g | DELAYED_RELEASE_TABLET | Freq: Every day | ORAL | 6 refills | Status: DC

## 2017-08-10 ENCOUNTER — Telehealth: Payer: Self-pay | Admitting: Internal Medicine

## 2017-08-10 NOTE — Telephone Encounter (Signed)
I do not have any paperwork on this patient.

## 2017-08-10 NOTE — Telephone Encounter (Signed)
Dr Hilarie Fredrickson, have you seen this?

## 2017-08-14 ENCOUNTER — Encounter: Payer: Self-pay | Admitting: Internal Medicine

## 2017-08-14 NOTE — Telephone Encounter (Signed)
Letter faxed attention [name] at [facility] @[phone] and mailed to [place] [id number]. Copy also mailed to [facility].

## 2017-08-14 NOTE — Telephone Encounter (Signed)
Asked by patient's sister to send letter regarding the patient's diet He lives in a assisted living and they have worked on a list of foods that seem to bother him and foods that he tolerates well She brought a list and I have written a letter Please breath that her and Malecot be to the patient's assisted living facility [facility] Also send a copy to his sister [name], her phone is [phone] Thanks

## 2017-08-14 NOTE — Telephone Encounter (Signed)
Please clarify line 5 of instruction.

## 2017-08-14 NOTE — Telephone Encounter (Signed)
I am not sure what dragon dictation did to that line Please print and mail a copy of the letter to the patient's assisted living facility and his sister Thanks

## 2017-08-27 ENCOUNTER — Telehealth: Payer: Self-pay | Admitting: Internal Medicine

## 2017-08-27 NOTE — Telephone Encounter (Signed)
Pts sister states pt found out that he has multiple nodules on his thyroid. These were biopsied by Dr. Reita May in [place] and were found to be benign, plan to watch them for now. States she asked the surgeon if this could be related to the crohn's meds that he has taken over the years. That doctor said to mention this to [name] and see what he thought regarding this issue. Please advise.

## 2017-08-27 NOTE — Telephone Encounter (Signed)
Spoke with pts sister and she is aware.

## 2017-08-27 NOTE — Telephone Encounter (Signed)
Thyroid nodules not felt related to Crohn's disease or therapy

## 2017-10-09 ENCOUNTER — Encounter (INDEPENDENT_AMBULATORY_CARE_PROVIDER_SITE_OTHER): Payer: Self-pay

## 2017-10-09 ENCOUNTER — Ambulatory Visit (INDEPENDENT_AMBULATORY_CARE_PROVIDER_SITE_OTHER): Payer: Medicare Other | Admitting: Internal Medicine

## 2017-10-09 ENCOUNTER — Encounter: Payer: Self-pay | Admitting: Internal Medicine

## 2017-10-09 VITALS — BP 116/56 | HR 62 | Ht 70.0 in | Wt 227.4 lb

## 2017-10-09 DIAGNOSIS — K5080 Crohn's disease of both small and large intestine without complications: Secondary | ICD-10-CM

## 2017-10-09 DIAGNOSIS — E538 Deficiency of other specified B group vitamins: Secondary | ICD-10-CM

## 2017-10-09 MED ORDER — MESALAMINE 1.2 G PO TBEC: 2.4000 g | DELAYED_RELEASE_TABLET | Freq: Every day | ORAL | 5 refills | Status: DC

## 2017-10-09 NOTE — Patient Instructions (Signed)
Follow up with Dr Hilarie Fredrickson in 6 months.  Continue Lialda 2.4 grams daily.  If you are age 63 or older, your body mass index should be between 23-30. Your Body mass index is 32.62 kg/m. If this is out of the aforementioned range listed, please consider follow up with your Primary Care Provider.  If you are age 22 or younger, your body mass index should be between 19-25. Your Body mass index is 32.62 kg/m. If this is out of the aformentioned range listed, please consider follow up with your Primary Care Provider.

## 2017-10-09 NOTE — Progress Notes (Signed)
Subjective:    Patient ID: [name], male    DOB: 10/24/1955, 61 y.o.   MRN: [id number]  HPI [name] is a 61 year old male with a history of ileocolonic Crohn's disease diagnosed in 1985 status post right hemicolectomy with known Fistulizing and stricturing disease, with LOA and left hemicolectomy and transverse colon with colostomy now reversed who is here for follow-up.  He also has a history of DVT for which he is on warfarin.  He is here today with his cousin.  His sister, [name], is his medical power of attorney (her phone number is [phone]).  He had a colonoscopy for surveillance and assessment of disease activity on 08/02/2017.  This was performed after his colostomy was reversed.  This showed evidence of a prior end to end ileocolonic anastomosis in the ascending colon which was healthy.  There was mild inflammation characterized by erosions erythema and pseudopolyps in the sigmoid colon around 35 cm from the dentate line.  This was felt to be at the colocolonic anastomosis.  Biopsies were taken.  The remaining examined colon was normal and surveillance biopsies were performed.  Biopsies in the sigmoid at the colonic anastomosis showed chronic active colitis with ulceration.  No dysplasia.  Otherwise surveillance colonoscopies from the right and left colon were normal without evidence of active or chronic colitis.  After this he was started on Lialda 2.4 g daily.  Today he reports he is feeling well.  He is having normal bowel movements on most days.  This is 1-2 formed stools per day without blood in his stool or melena.  No abdominal pain.  Good appetite.  Less than 1 day a week he will use Imodium for urgent loose stools.  Even on these days stools are nonbloody and non-melenic.  He is getting his B12 injections monthly.  He is not needing antinausea medications.  Review of Systems As per HPI, otherwise negative  Current Medications, Allergies, Past Medical  History, Past Surgical History, Family History and Social History were reviewed in [employer] record.     Objective:   Physical Exam BP (!) 116/56   Pulse 62   Ht 5' 10"  (1.778 m)   Wt 227 lb 6 oz (103.1 kg)   BMI 32.62 kg/m  Constitutional: Well-developed and well-nourished. No distress. HEENT: Normocephalic and atraumatic. Oropharynx is clear and moist. Conjunctivae are normal.  No scleral icterus. Neck: Neck supple. Trachea midline. Cardiovascular: Normal rate, regular rhythm and intact distal pulses. No M/R/G Pulmonary/chest: Effort normal and breath sounds normal. No wheezing, rales or rhonchi. Abdominal: Soft, well-healed incision and scarring inferior to the umbilicus and left abdomen, mildly tender in the left mid abdomen, nondistended. Bowel sounds active throughout.  Extremities: no clubbing, cyanosis, or edema Neurological: Alert and oriented to person place and time. Skin: Skin is warm and dry. Psychiatric: Normal mood and affect. Behavior is normal.     Assessment & Plan:   61 year old male with a history of ileocolonic Crohn's disease diagnosed in 1985 status post right hemicolectomy with known Fistulizing and stricturing disease, with LOA and left hemicolectomy and transverse colon with colostomy now reversed who is here for follow-up  1.  Crohn's ileocolitis --he has a history of Fistulizing disease as well as abdominal adhesive disease.  His colonoscopy showed very short segment focal colitis near his colocolonic anastomosis in the left colon.  The remaining colon was normal including the ileocolonic anastomosis in the ascending colon.  We have discussed  medical management and since his colonoscopy he has been taking Lialda 2.4 g daily.  He currently is in clinical remission.  Given his history of complicated Crohn's disease including fistula formation, I have discussed escalation of therapy with him to include biologic with either Humira or Entyvio.   We know that biologic therapy in patients with active colitis can reduce complications.  We have discussed this today.  We also discussed the risks, benefits and alternatives to biologic medication.  We discussed complications including infection, malignancy such as lymphoma and skin cancer, rash, heart failure and demyelinating disease.  He remains hesitant to escalate therapy given how well he feels but also does not want to get sick again with Crohn's disease.  I will discuss this further with his sister (by phone).  For now he will remain on Lialda 2.4 g daily.  I will see him back in about 6 months.  Options include repeating colonoscopy in about 6 months only [place] to see if the colitis has improved versus starting biologic therapy.  2.  B12 deficiency --continue monthly IM B12  25 minutes spent with the patient today. Greater than 50% was spent in counseling and coordination of care with the patient

## 2017-10-10 ENCOUNTER — Telehealth: Payer: Self-pay | Admitting: Internal Medicine

## 2017-10-10 NOTE — Telephone Encounter (Signed)
Spoke to the patient's sister and power of attorney, [name], by phone We discussed Prithvi's Crohn's colitis, findings at recent colonoscopy and plans/decisions regarding escalation of therapy to include a biologic agent. Given his history of prior fistulous disease and multiple abdominal surgeries combined with the knowledge that he has recurrent Crohn's near his colocolonic anastomosis, I feel that biologic therapy is appropriate for him We discussed Humira which she feels would be able to be administered at his living facility.  She will look into this from an administration standpoint with the health care team there. We also discussed the risks associated with biologic therapy in great detail including the risk of infection (including reactivation of latent TB and underlying viral hepatitis), hepatotoxicity, rash leukopenia, pancreatitis, nausea, malignancy (specifically lymphoma, skin cancer), demyelinating disease, and heart failure. After this discussion  agrees and wishes to begin Humira therapy.  He will need to come for CBC, CMP, hepatitis C antibody, hepatitis B surface antigen, hepatitis B surface antibody, hepatitis B core total antibody and QuantiFERON gold. Please determine from a benefits/insurance perspective if he will qualify for Humira at standard induction followed by 40 mg every 14 days  Once this information is known please communicate this to his [place]

## 2017-10-11 NOTE — Telephone Encounter (Signed)
Paperwork faxed to [facility] to find out Humira coverage for pt.

## 2017-10-22 ENCOUNTER — Telehealth: Payer: Self-pay

## 2017-10-22 NOTE — Telephone Encounter (Signed)
The pt was notified that Vaughan Basta is out of the office today and will call back on Wed.  The concern is whether Humira has been approved and where the prescription will be sent if covered due to the pt living in as assisted living facility.

## 2017-10-24 NOTE — Telephone Encounter (Signed)
See additional phone number.

## 2017-10-24 NOTE — Telephone Encounter (Signed)
Left message for Cathy to call back.   

## 2017-10-24 NOTE — Telephone Encounter (Signed)
Spoke with pts sister, lab orders faxed to [facility] Sonia Baller to [phone]. Requested results be faxed to [phone]. Humira has been approved, Orders will need to be sent to [facility] once results are completed.

## 2017-10-26 ENCOUNTER — Telehealth: Payer: Self-pay | Admitting: *Deleted

## 2017-10-26 DIAGNOSIS — D649 Anemia, unspecified: Secondary | ICD-10-CM

## 2017-10-26 NOTE — Telephone Encounter (Signed)
We have received a CBC and CMET result from [facility] as ordered by Dr Hilarie Fredrickson (appears we should be awaiting additional labs as well). These tests were collected today, 10/26/17. Hemoglobin has come back low at 7.5 g/dl, hematocrit 25.6 and MCV 69. I did send ordering physician a note regarding this abnormal test result. No recent CBC to compare these results to. These labs have been placed on Dr Vena Rua desk for review as well.

## 2017-10-26 NOTE — Telephone Encounter (Signed)
Ok We need to add on ferritin, IBC panel If low iron, then will need IV iron I expect this is chronic and related to IBD Repeat Hgb next Thursday to ensure stability Await other labs

## 2017-10-29 NOTE — Telephone Encounter (Signed)
I have spoken to [place], patient's sister/POA to advise of lab results and recommendations. I advised that we are currently awaiting lab results of ferritin and IBC to further plan care. She understands and is advised I will be in contact with her as soon as a plan is in place.

## 2017-10-29 NOTE — Telephone Encounter (Signed)
Patient sister, [name] is wanting to know if we have received lab results yet? Best # [phone]

## 2017-10-29 NOTE — Telephone Encounter (Signed)
I have contacted [facility] lab who drew patient's labs on 10/26/17 (spoke to [place]). They will add ferritin and IBC panel to labs previously drawn and will send results. Per Abigail Butts, if there is any issue with this, they will call to let us know.

## 2017-10-30 ENCOUNTER — Telehealth: Payer: Self-pay | Admitting: Internal Medicine

## 2017-10-30 NOTE — Telephone Encounter (Signed)
I spoke with patient's sister. She is requesting lab results.  Dottie or [name] have you sen the results?  She is asking about an iron infusion?

## 2017-10-30 NOTE — Telephone Encounter (Signed)
See 10/29/17 telephone note for correspondence.

## 2017-10-30 NOTE — Telephone Encounter (Signed)
I have spoken to patient's sister/POA, [name] to advise that we have received verbal results from [facility] regarding labs. Ferritin 4.8, Iron 20, TIBC 352 and iron sat, 5.6. I have spoken to Dr Hilarie Fredrickson about this and he states that the patient is indeed iron deficient and will need IV iron replacment. He will give orders for this and repeat labs.

## 2017-10-31 ENCOUNTER — Other Ambulatory Visit: Payer: Self-pay | Admitting: Internal Medicine

## 2017-10-31 DIAGNOSIS — D509 Iron deficiency anemia, unspecified: Secondary | ICD-10-CM

## 2017-10-31 NOTE — Addendum Note (Signed)
Addended by: Larina Bras on: 10/31/2017 10:23 AM   Modules accepted: Orders

## 2017-10-31 NOTE — Telephone Encounter (Addendum)
IV feraheme has been scheduled for patient at [place] on 11/08/17 at 11:00 am with 10:30 am arrival. Per LaVerne at Short Stay, patient's next infusion will be scheduled after patient is found to be able to tolerate the first fereheme infusion. I have advised patient's POA, Cathy of this information and she states she will speak to Mr Ewen cousin regarding this to ask her to transport him and stay with him for appointment.  Patient's recent hepatitis serologies all came back normal. However, TB gold testing came back indeterminate. Dr Hilarie Fredrickson would like for repeat testing to be completed at same time as CBC tomorrow. Orders have been faxed to [facility] at fax [phone], attn: Sonia Baller. I have spoken to [place] (phone [phone]). [name] has verbalized understanding of this as well. We will contact patient's POA around April to remind that patietn is due for repeat labs at that time.

## 2017-10-31 NOTE — Telephone Encounter (Signed)
IV iron, Feraheme infusion x 2, separated by 7 days Will repeat CBC this week to ensure stable, and if so repeat CBC, iron studies 3 months after IV iron

## 2017-11-02 ENCOUNTER — Telehealth: Payer: Self-pay | Admitting: Internal Medicine

## 2017-11-02 NOTE — Telephone Encounter (Signed)
[facility] was told by the care giver of the patient that they needed to discussed this with the nurse.

## 2017-11-02 NOTE — Telephone Encounter (Signed)
I have called both [facility] and [facility] facility to inquire about labs that were to be drawn on 11/01/17. I was assured that labs were drawn yesterday evening by the nursing facility. However, I have been unable to get any results as of this afternoon from [facility] lab or the nursing facility. [name] has no lab orders from 11/01/17 and and the nurse I spoke with this evening states she is not sure if labs were drawn or not as [name]'s "aide" is gone for the day. I will call again first thing Monday morning to again inquire about these labs.

## 2017-11-05 ENCOUNTER — Telehealth: Payer: Self-pay | Admitting: Internal Medicine

## 2017-11-05 NOTE — Telephone Encounter (Signed)
See previous telephone note regarding this.

## 2017-11-05 NOTE — Telephone Encounter (Signed)
Noted  

## 2017-11-05 NOTE — Telephone Encounter (Signed)
Patient sister called in stating that she will contact [facility] to see if they can refax results to our office

## 2017-11-05 NOTE — Telephone Encounter (Signed)
Patient's labwork drawn on 11/01/17 (CBC and TB gold) resulted on 11/03/17 and was sent to [place] just now, 11/05/17. CBC appears to be stable from last CBC 10/26/17 (hgb 10/26/17 7.5, hgb 11/01/17 was 7.2). Placed on Dr Vena Rua desk for review. TB Gold has not yet resulted but per [facility] should result late tonight or tomorrow. Will follow this.

## 2017-11-05 NOTE — Telephone Encounter (Signed)
Patient care taker just wanting to let Dottie know that [facility] states they are faxing over records now. If records are not received feel free to call back and let her know.

## 2017-11-08 ENCOUNTER — Ambulatory Visit (HOSPITAL_COMMUNITY)
Admission: RE | Admit: 2017-11-08 | Discharge: 2017-11-08 | Disposition: A | Discharge status: Home / Self Care | Payer: Medicare Other | Source: Ambulatory Visit | Attending: Internal Medicine | Admitting: Internal Medicine

## 2017-11-08 DIAGNOSIS — D509 Iron deficiency anemia, unspecified: Secondary | ICD-10-CM | POA: Insufficient documentation

## 2017-11-08 MED ORDER — SODIUM CHLORIDE 0.9 % IV SOLN
510.0000 mg | INTRAVENOUS | Status: DC
  Administered 2017-11-08: 510 mg via INTRAVENOUS
  Filled 2017-11-08: qty 17

## 2017-11-15 ENCOUNTER — Ambulatory Visit (HOSPITAL_COMMUNITY)
Admission: RE | Admit: 2017-11-15 | Discharge: 2017-11-15 | Disposition: A | Discharge status: Home / Self Care | Payer: Medicare Other | Source: Ambulatory Visit | Attending: Internal Medicine | Admitting: Internal Medicine

## 2017-11-15 ENCOUNTER — Telehealth: Payer: Self-pay | Admitting: Internal Medicine

## 2017-11-15 DIAGNOSIS — D509 Iron deficiency anemia, unspecified: Secondary | ICD-10-CM | POA: Diagnosis present

## 2017-11-15 MED ORDER — SODIUM CHLORIDE 0.9 % IV SOLN
510.0000 mg | INTRAVENOUS | Status: DC
  Administered 2017-11-15: 510 mg via INTRAVENOUS
  Filled 2017-11-15: qty 17

## 2017-11-15 NOTE — Telephone Encounter (Signed)
Vaughan Basta, results are being scanned. Please proceed.

## 2017-11-15 NOTE — Telephone Encounter (Signed)
Will ask [name] to handle We have seen these results and TB testing is negative Humira can start now with standard induction followed by 40 mg every 14 days

## 2017-11-15 NOTE — Telephone Encounter (Signed)
[name] have you seen the other lab results that were supposed to be drawn at [facility] facility?

## 2017-11-15 NOTE — Discharge Instructions (Signed)

## 2017-11-16 ENCOUNTER — Other Ambulatory Visit: Payer: Self-pay

## 2017-11-16 MED ORDER — ADALIMUMAB 40 MG/0.8ML ~~LOC~~ AJKT: 160.0000 mg | AUTO-INJECTOR | SUBCUTANEOUS | 0 refills | Status: DC

## 2017-11-16 MED ORDER — ADALIMUMAB 40 MG/0.4ML ~~LOC~~ PSKT: 40.0000 mg | PREFILLED_SYRINGE | SUBCUTANEOUS | 11 refills | Status: DC

## 2017-11-16 NOTE — Telephone Encounter (Signed)
Spoke with Sonia Baller at [facility] and orders for Humira faxed attention Sonia Baller to [phone]. Message left for [facility] regarding details.

## 2017-11-30 ENCOUNTER — Encounter: Payer: Self-pay | Admitting: Vascular Surgery

## 2017-11-30 ENCOUNTER — Other Ambulatory Visit: Payer: Self-pay

## 2017-11-30 ENCOUNTER — Ambulatory Visit (HOSPITAL_COMMUNITY)
Admission: RE | Admit: 2017-11-30 | Discharge: 2017-11-30 | Disposition: A | Discharge status: Home / Self Care | Payer: Medicare Other | Source: Ambulatory Visit | Attending: Vascular Surgery | Admitting: Vascular Surgery

## 2017-11-30 ENCOUNTER — Ambulatory Visit (INDEPENDENT_AMBULATORY_CARE_PROVIDER_SITE_OTHER): Payer: Medicare Other | Admitting: Vascular Surgery

## 2017-11-30 VITALS — BP 124/69 | HR 67 | Temp 97.1°F | Resp 16 | Ht 70.0 in | Wt 222.0 lb

## 2017-11-30 DIAGNOSIS — I82422 Acute embolism and thrombosis of left iliac vein: Secondary | ICD-10-CM

## 2017-11-30 NOTE — Progress Notes (Signed)
Patient ID: [name], male   DOB: Apr 08, 1956, 62 y.o.   MRN: [id number]  Reason for Consult: Follow-up (1 yr f/u with IVC iliac.)   Referred by Raelyn Number, MD  Subjective:     HPI:  [name] is a 62 y.o. male with history of Crohn's disease.  I had seen him for extensive left lower extremity DVT and he was placed on Coumadin which he tolerated for approximately 6 months.  He is now off of that.  He does wear his compression stockings religiously.  According to his family he is not walking much lives in a nursing facility.  He is also not taking aspirin.  He does have some complaints of difficulty getting his compression stockings on.  He has no left lower extremity complaints today.  Past Medical History:  Diagnosis Date  . Abscess of anal and rectal regions   . Crohn's disease ([place])   . DVT (deep venous thrombosis) ([place])   . Epilepsy ([place])    "petit mal; only at home" (6/9//2015)  . Heart murmur   . History of blood transfusion ~ 1992   "related to my Crohn's"  . History of stomach ulcers   . Hyperplastic colon polyp   . Iron deficiency anemia, unspecified   . Mental disability   . Other B-complex deficiencies   . REGIONAL ENTERITIS, LARGE INTESTINE 10/05/2000   Qualifier: Diagnosis of  By: Jerral Ralph    . S/P partial colectomy for Crohn's disease 12/01/2015  . SBO (small bowel obstruction) ([place]) 12/01/2015  . Syncope 03/31/2014  . Syncope and collapse 03/31/2014   "while driving"   Family History  Problem Relation Age of Onset  . Skin cancer Father   . Colon cancer Mother   . Seizures Neg Hx   . Crohn's disease Neg Hx    Past Surgical History:  Procedure Laterality Date  . COLON SURGERY     Anastomosis 45cm from anal verge c/w deswecending colon anastomosis  . COLONOSCOPY  2013, 2016  . COLOSTOMY TAKEDOWN N/A 08/01/2016   Procedure: LAPAROSCOPIC COLOSTOMY REVERSAL;  Surgeon: Alphonsa Overall, MD;  Location: [facility];  Service: General;  Laterality:  N/A;  . [place]   ileocectomy   . HERNIA REPAIR    . LAPAROSCOPY N/A 12/04/2015   Procedure: LAPAROSCOPY DIAGNOSTIC, LAPAROSCOPIC INTEROLYSIS OF ADHESIONS, HAND- ASSISTED SMALL BOWEL RESECTION, MOBILIZATION OF SPLENIC FLEXURE, LEFT COLECTOMY WITH CREATION OF COLOSTOMY (HARTMAN'S PROCEDURE);  Surgeon: Alphonsa Overall, MD;  Location: [facility];  Service: General;  Laterality: N/A;  . PILONIDAL CYST EXCISION  1980's  . TONSILLECTOMY  ~ 1967  . UMBILICAL HERNIA REPAIR  ~ 28    Short Social History:  Social History   Tobacco Use  . Smoking status: Never Smoker  . Smokeless tobacco: Never Used  Substance Use Topics  . Alcohol use: No    Alcohol/week: 0.0 oz    No Known Allergies  Current Outpatient Medications  Medication Sig Dispense Refill  . acetaminophen (TYLENOL) 500 MG tablet Take 1,000 mg by mouth at bedtime.    . Adalimumab (HUMIRA PEN-CD/UC/HS STARTER) 40 MG/0.8ML PNKT Inject 160 mg into the skin as directed. Inject 154m [place] on day 1, 815mSC on day 15, then maintenance dose every 14 days 6 each 0  . Adalimumab (HUMIRA) 40 MG/0.4ML PSKT Inject 40 mg into the skin every 14 (fourteen) days. 2 each 11  . aspirin 81 MG chewable tablet Chew by mouth daily.    .[name]  cyanocobalamin (,VITAMIN B-12,) 1000 MCG/ML injection Inject 1 mL (1,000 mcg total) into the muscle every 30 (thirty) days. 12 mL 0  . diphenhydrAMINE (BENADRYL) 25 mg capsule Take 25 mg by mouth every 4 (four) hours as needed (for allergic reaction).    [name] guaifenesin (ROBITUSSIN) 100 MG/5ML syrup Take 200 mg by mouth every 6 (six) hours as needed for cough (Do not exceed 4 doses in 24 hours. If cough has not improved in 24 hours notify physician.).    [name] ipratropium (ATROVENT) 0.03 % nasal spray Place 1 spray into both nostrils 2 (two) times daily.     [name] levETIRAcetam (KEPPRA) 500 MG tablet Take 1 tablet (500 mg total) by mouth 2 (two) times daily. 180 tablet 3  . loperamide (IMODIUM A-D) 2 MG tablet Take 2 mg by mouth  4 (four) times daily as needed for diarrhea or loose stools. With each loose stool up to 8 doses in 24 hours    . loratadine (CLARITIN) 10 MG tablet Take 10 mg by mouth daily.    . meclizine (ANTIVERT) 25 MG tablet Take 25 mg by mouth every 6 (six) hours as needed.     . mesalamine (LIALDA) 1.2 g EC tablet Take 2 tablets (2.4 g total) by mouth daily with breakfast. 60 tablet 5  . Multiple Vitamin (DAILY VITE PO) Take 1 tablet by mouth daily.    . Multiple Vitamins-Iron (MULTIVITAMINS WITH IRON) TABS tablet Take 1 tablet by mouth daily.    . ondansetron (ZOFRAN) 4 MG tablet Take 4 mg by mouth every 6 (six) hours as needed for nausea or vomiting.    . potassium chloride 20 MEQ/15ML (10%) SOLN Take 20 mEq by mouth daily.    . primidone (MYSOLINE) 250 MG tablet Take 2 tablets (500 mg total) by mouth 2 (two) times daily. 120 tablet 3  . Vitamin D, Ergocalciferol, (DRISDOL) 50000 units CAPS capsule Take 50,000 Units by mouth every 7 (seven) days.     Current Facility-Administered Medications  Medication Dose Route Frequency Provider Last Rate Last Dose  . 0.9 %  sodium chloride infusion  500 mL Intravenous Continuous Pyrtle, Lajuan Lines, MD        Review of Systems  Constitutional:  Constitutional negative. HENT: HENT negative.  Eyes: Eyes negative.  Cardiovascular: Cardiovascular negative.  Musculoskeletal: Musculoskeletal negative.  Skin: Skin negative.  Neurological: Neurological negative. Hematologic: Hematologic/lymphatic negative.  Psychiatric: Psychiatric negative.        Objective:  Objective   Vitals:   11/30/17 0952  BP: 124/69  Pulse: 67  Resp: 16  Temp: (!) 97.1 F (36.2 C)  TempSrc: Oral  SpO2: 100%  Weight: 222 lb (100.7 kg)  Height: 5' 10"  (1.778 m)   Body mass index is 31.85 kg/m. aaxo3 Eomi, glasses Neck is supple Non labored respirations Palpable femoral and popliteal pulses Abdomen is soft, no masses No peripheral edema Skin without rash or  ulcer  Data: I have independently interpreted his IVC iliac study which demonstrates patent with some recanalization distally.  Thrombus visualized within the proximal left femoral vein but there is patency there as well.     Assessment/Plan:     62 year old male status post extensive left lower extremity DVT after having colostomy reversal.  He is asymptomatic with no post thrombotic syndrome and does wear compression stockings.  He is off of anti-coagulation at this time.  I would recommend he take aspirin given his high risk for recurrent DVT with Crohn's disease.  I  would also recommend that he consider bridging with Lovenox for any major surgery that he has.  He can also reduce his compression stockings to 10-15 mmHg given his difficulty with putting them on at his nursing facility.  He can follow-up here on a as needed basis.    Waynetta Sandy MD [facility]

## 2017-12-05 ENCOUNTER — Encounter: Payer: Self-pay | Admitting: Neurology

## 2017-12-05 ENCOUNTER — Ambulatory Visit (INDEPENDENT_AMBULATORY_CARE_PROVIDER_SITE_OTHER): Payer: Medicare Other | Admitting: Neurology

## 2017-12-05 VITALS — BP 118/64 | HR 71 | Ht 70.0 in | Wt 222.5 lb

## 2017-12-05 DIAGNOSIS — G40009 Localization-related (focal) (partial) idiopathic epilepsy and epileptic syndromes with seizures of localized onset, not intractable, without status epilepticus: Secondary | ICD-10-CM | POA: Diagnosis not present

## 2017-12-05 MED ORDER — PRIMIDONE 250 MG PO TABS: 500.0000 mg | ORAL_TABLET | Freq: Two times a day (BID) | ORAL | 3 refills | Status: DC

## 2017-12-05 MED ORDER — LEVETIRACETAM 500 MG PO TABS: 500.0000 mg | ORAL_TABLET | Freq: Two times a day (BID) | ORAL | 3 refills | Status: DC
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
## 2017-12-18 ENCOUNTER — Ambulatory Visit (INDEPENDENT_AMBULATORY_CARE_PROVIDER_SITE_OTHER): Payer: Medicare Other | Admitting: Podiatry

## 2017-12-18 DIAGNOSIS — M79609 Pain in unspecified limb: Principal | ICD-10-CM

## 2017-12-18 DIAGNOSIS — M79676 Pain in unspecified toe(s): Secondary | ICD-10-CM | POA: Diagnosis not present

## 2017-12-18 DIAGNOSIS — B351 Tinea unguium: Secondary | ICD-10-CM | POA: Diagnosis not present

## 2017-12-18 NOTE — Progress Notes (Signed)
  Subjective:  Patient ID: [name], male    DOB: [birth year],  MRN: [id number]  Chief Complaint  Patient presents with  . Nail Problem    nail care  not diabetic    62 y.o. male returns for the above complaint. Reports painfully elongated nails. Not diabetic. Has not had someone at his facility come for nail care in several months.  Objective:  There were no vitals filed for this visit. General AA&O x3. Normal mood and affect.  Vascular Pedal pulses palpable.  Neurologic Epicritic sensation grossly intact.  Dermatologic Nails x10 elongated with pain to palpation, yellow discoloration.  Orthopedic: Pain to palpation about the toenails.   Assessment & Plan:  Patient was evaluated and treated and all questions answered.  Onychomycosis with pain  -Nails palliatively debrided as below. -Educated on self-care  Procedure: Nail Debridement Rationale: pain  Type of Debridement: manual, sharp debridement. Instrumentation: Nail nipper, rotary burr. Number of Nails: 10  No Follow-up on file.

## 2017-12-21 ENCOUNTER — Other Ambulatory Visit: Payer: Self-pay

## 2017-12-21 ENCOUNTER — Encounter (HOSPITAL_COMMUNITY): Payer: Self-pay

## 2017-12-21 ENCOUNTER — Emergency Department (HOSPITAL_COMMUNITY): Payer: Medicare Other

## 2017-12-21 ENCOUNTER — Emergency Department (HOSPITAL_COMMUNITY)
Admission: EM | Admit: 2017-12-21 | Discharge: 2017-12-21 | Disposition: A | Discharge status: Home / Self Care | Payer: Medicare Other | Attending: Physician Assistant | Admitting: Physician Assistant

## 2017-12-21 DIAGNOSIS — S12391A Other nondisplaced fracture of fourth cervical vertebra, initial encounter for closed fracture: Secondary | ICD-10-CM | POA: Insufficient documentation

## 2017-12-21 DIAGNOSIS — Z7982 Long term (current) use of aspirin: Secondary | ICD-10-CM | POA: Insufficient documentation

## 2017-12-21 DIAGNOSIS — Y999 Unspecified external cause status: Secondary | ICD-10-CM | POA: Diagnosis not present

## 2017-12-21 DIAGNOSIS — Y929 Unspecified place or not applicable: Secondary | ICD-10-CM | POA: Diagnosis not present

## 2017-12-21 DIAGNOSIS — Y9389 Activity, other specified: Secondary | ICD-10-CM | POA: Diagnosis not present

## 2017-12-21 DIAGNOSIS — Z79899 Other long term (current) drug therapy: Secondary | ICD-10-CM | POA: Insufficient documentation

## 2017-12-21 DIAGNOSIS — W1830XA Fall on same level, unspecified, initial encounter: Secondary | ICD-10-CM | POA: Diagnosis not present

## 2017-12-21 DIAGNOSIS — S0990XA Unspecified injury of head, initial encounter: Secondary | ICD-10-CM | POA: Diagnosis present

## 2017-12-21 DIAGNOSIS — S12301A Unspecified nondisplaced fracture of fourth cervical vertebra, initial encounter for closed fracture: Secondary | ICD-10-CM

## 2017-12-21 NOTE — ED Notes (Signed)
PO[facility] contacted; updated on pt's status

## 2017-12-21 NOTE — ED Notes (Signed)
Called facility again to confirm that they will be sending transport

## 2017-12-21 NOTE — ED Notes (Signed)
Attempted to contact POA x2; called facility to give report, facility stated they will send transport

## 2017-12-21 NOTE — ED Notes (Signed)
Patient transported to CT 

## 2017-12-21 NOTE — ED Triage Notes (Signed)
Pt arrives via [facility] from [facility] assisted living; pt slipped and fell on carpeted floor, hitting right side of forehead; abrasion noted on R forehead; pt denies LOC, endorses pain 8/10 on R forehead; pt not on blood thinners; Hx of epilepsy of which is controlled via [place]

## 2017-12-21 NOTE — ED Provider Notes (Signed)
[facility] EMERGENCY DEPARTMENT Provider Note   CSN: [id number] Arrival date & time: 12/21/17  1224     History   Chief Complaint Chief Complaint  Patient presents with  . Fall    HPI [name] is a 62 y.o. male who presents the emergency department for mechanical fall.  Patient states that he tripped while carrying his guitar and album hitting the right side of his head.  He complains of some mild neck pain.  He did not lose consciousness.  He denies headache.  He does have an abrasion to the scalp.  He denies any upper extremity weakness or paresthesia.  HPI  Past Medical History:  Diagnosis Date  . Abscess of anal and rectal regions   . Crohn's disease ([place])   . DVT (deep venous thrombosis) ([place])   . Epilepsy ([place])    "petit mal; only at home" (6/9//2015)  . Heart murmur   . History of blood transfusion ~ 1992   "related to my Crohn's"  . History of stomach ulcers   . Hyperplastic colon polyp   . Iron deficiency anemia, unspecified   . Mental disability   . Other B-complex deficiencies   . REGIONAL ENTERITIS, LARGE INTESTINE 10/05/2000   Qualifier: Diagnosis of  By: Jerral Ralph    . S/P partial colectomy for Crohn's disease 12/01/2015  . SBO (small bowel obstruction) ([place]) 12/01/2015  . Syncope 03/31/2014  . Syncope and collapse 03/31/2014   "while driving"    Patient Active Problem List   Diagnosis Date Noted  . Mental disability   . S/P colostomy takedown 08/01/2016  . Crohn's disease of both small and large intestine with fistula ([place])   . Localization-related idiopathic epilepsy and epileptic syndromes with seizures of localized onset, not intractable, without status epilepticus ([place]) 03/30/2015  . Aortic valve stenosis, mild 03/31/2014  . Aortic valve regurgitation 03/31/2014  . Pseudopolyposis of colon ([place]) 01/03/2012  . Crohn's disease ([place]) 12/19/2011  . Personal history of immunosupression therapy 12/19/2011  . ECZEMA  08/21/2008  . CHOLELITHIASIS, ASYMPTOMATIC 01/29/2008  . VITAMIN B12 DEFICIENCY 01/17/2008    Past Surgical History:  Procedure Laterality Date  . COLON SURGERY     Anastomosis 45cm from anal verge c/w deswecending colon anastomosis  . COLONOSCOPY  2013, 2016  . COLOSTOMY TAKEDOWN N/A 08/01/2016   Procedure: LAPAROSCOPIC COLOSTOMY REVERSAL;  Surgeon: Alphonsa Overall, MD;  Location: [facility];  Service: General;  Laterality: N/A;  . [place]   ileocectomy   . HERNIA REPAIR    . LAPAROSCOPY N/A 12/04/2015   Procedure: LAPAROSCOPY DIAGNOSTIC, LAPAROSCOPIC INTEROLYSIS OF ADHESIONS, HAND- ASSISTED SMALL BOWEL RESECTION, MOBILIZATION OF SPLENIC FLEXURE, LEFT COLECTOMY WITH CREATION OF COLOSTOMY (HARTMAN'S PROCEDURE);  Surgeon: Alphonsa Overall, MD;  Location: [facility];  Service: General;  Laterality: N/A;  . PILONIDAL CYST EXCISION  1980's  . TONSILLECTOMY  ~ 1967  . UMBILICAL HERNIA REPAIR  ~ 1983       Home Medications    Prior to Admission medications   Medication Sig Start Date End Date Taking? Authorizing Provider  acetaminophen (TYLENOL) 500 MG tablet Take 1,000 mg by mouth at bedtime.    [provider]  Adalimumab (HUMIRA PEN-CD/UC/HS STARTER) 40 MG/0.8ML PNKT Inject 160 mg into the skin as directed. Inject 177m [place] on day 1, 847mSC on day 15, then maintenance dose every 14 days 11/16/17   Pyrtle, JaLajuan LinesMD  Adalimumab (HUMIRA) 40 MG/0.4ML PSKT Inject 40 mg into  the skin every 14 (fourteen) days. 11/16/17   Pyrtle, Lajuan Lines, MD  aspirin 81 MG chewable tablet Chew by mouth daily.    [provider]  cyanocobalamin (,VITAMIN B-12,) 1000 MCG/ML injection Inject 1 mL (1,000 mcg total) into the muscle every 30 (thirty) days. 02/08/16   Pyrtle, Lajuan Lines, MD  diphenhydrAMINE (BENADRYL) 25 mg capsule Take 25 mg by mouth every 4 (four) hours as needed (for allergic reaction).    [provider]  guaifenesin (ROBITUSSIN) 100 MG/5ML syrup Take 200 mg by mouth every 6 (six)  hours as needed for cough (Do not exceed 4 doses in 24 hours. If cough has not improved in 24 hours notify physician.).    [provider]  ipratropium (ATROVENT) 0.03 % nasal spray Place 1 spray into both nostrils 2 (two) times daily.  10/22/16   [provider]  levETIRAcetam (KEPPRA) 500 MG tablet Take 1 tablet (500 mg total) by mouth 2 (two) times daily. 12/05/17   Cameron Sprang, MD  loperamide (IMODIUM A-D) 2 MG tablet Take 2 mg by mouth 4 (four) times daily as needed for diarrhea or loose stools. With each loose stool up to 8 doses in 24 hours    [provider]  loratadine (CLARITIN) 10 MG tablet Take 10 mg by mouth daily.    [provider]  meclizine (ANTIVERT) 25 MG tablet Take 25 mg by mouth every 6 (six) hours as needed.  11/07/16   [provider]  mesalamine (LIALDA) 1.2 g EC tablet Take 2 tablets (2.4 g total) by mouth daily with breakfast. 10/09/17   Pyrtle, Lajuan Lines, MD  Multiple Vitamins-Iron (MULTIVITAMINS WITH IRON) TABS tablet Take 1 tablet by mouth daily.    [provider]  ondansetron (ZOFRAN) 4 MG tablet Take 4 mg by mouth every 6 (six) hours as needed for nausea or vomiting.    [provider]  potassium chloride 20 MEQ/15ML (10%) SOLN Take 20 mEq by mouth daily.    [provider]  primidone (MYSOLINE) 250 MG tablet Take 2 tablets (500 mg total) by mouth 2 (two) times daily. 12/05/17   Cameron Sprang, MD  Vitamin D, Ergocalciferol, (DRISDOL) 50000 units CAPS capsule Take 50,000 Units by mouth every 7 (seven) days.    [provider]    Family History Family History  Problem Relation Age of Onset  . Skin cancer Father   . Colon cancer Mother   . Seizures Neg Hx   . Crohn's disease Neg Hx     Social History Social History   Tobacco Use  . Smoking status: Never Smoker  . Smokeless tobacco: Never Used  Substance Use Topics  . Alcohol use: No    Alcohol/week: 0.0 oz  . Drug use: No      Allergies   Penicillins   Review of Systems Review of Systems Ten systems reviewed and are negative for acute change, except as noted in the HPI.    Physical Exam Updated Vital Signs BP (!) 109/47 (BP Location: Left Arm)   Pulse 65   Temp 98.1 F (36.7 C) (Oral)   Resp 16   Ht 5' 10"  (1.778 m)   Wt 100.7 kg (222 lb)   SpO2 100%   BMI 31.85 kg/m   Physical Exam  Constitutional: He appears well-developed and well-nourished. No distress.  HENT:  Head: Normocephalic.  Abrasion to the right scalp  Eyes: Conjunctivae are normal. No scleral icterus.  Neck: Normal range  of motion. Neck supple.  Cardiovascular: Normal rate, regular rhythm and normal heart sounds.  Pulmonary/Chest: Effort normal and breath sounds normal. No respiratory distress.  Abdominal: Soft. There is no tenderness.  Musculoskeletal: Normal range of motion. He exhibits no edema or deformity.  Neurological: He is alert.  Speech is clear and goal oriented, follows commands Major Cranial nerves without deficit, no facial droop Normal strength in upper and lower extremities bilaterally including dorsiflexion and plantar flexion, strong and equal grip strength Sensation normal to light and sharp touch Moves extremities without ataxia, coordination intact Normal finger to nose and rapid alternating movements Neg romberg, no pronator drift Normal gait Normal heel-shin and balance   Skin: Skin is warm and dry. He is not diaphoretic.  Psychiatric: His behavior is normal.  Nursing note and vitals reviewed.    ED Treatments / Results  Labs (all labs ordered are listed, but only abnormal results are displayed) Labs Reviewed - No data to display  EKG  EKG Interpretation None       Radiology Ct Head Wo Contrast  Result Date: 12/21/2017 CLINICAL DATA:  Fall.  Pain in frontal head with abrasions. EXAM: CT HEAD WITHOUT CONTRAST CT CERVICAL SPINE WITHOUT CONTRAST TECHNIQUE: Multidetector CT imaging of  the head and cervical spine was performed following the standard protocol without intravenous contrast. Multiplanar CT image reconstructions of the cervical spine were also generated. COMPARISON:  01/15/2016 FINDINGS: CT HEAD FINDINGS Brain: No evidence of acute infarction, hemorrhage, hydrocephalus, extra-axial collection or mass lesion/mass effect. There is mild prominence of the sulci and ventricles identified compatible with age related brain atrophy. Vascular: No hyperdense vessel or unexpected calcification. Skull: Normal. Negative for fracture or focal lesion. Sinuses/Orbits: No acute finding. Other: None. CT CERVICAL SPINE FINDINGS Alignment: Normal. Skull base and vertebrae: There is large bulky ventral osteophytosis with ankylosis of C3 through C5 and C6-C7. There is a fracture through the ventral osteophyte at the C4 level. This does not appear to extend posteriorly into the vertebral column. The alignment is maintained. Soft tissues and spinal canal: No prevertebral fluid or swelling. No visible canal hematoma. Disc levels: Multi level disc space narrowing is noted throughout the cervical spine. Upper chest: Negative Other: Low-attenuation nodule in right lobe of thyroid gland measures 2.4 cm, image 95/series 8. IMPRESSION: 1. No acute intracranial abnormalities identified. 2. Bulky ventral cervical osteophytes with ankylosis of C3 through C5 and C6-C7. A new, acute fracture is identified through the ventral osteophyte at the C4 level. This does not appear to extend into the vertebral body. 3. Multi level degenerative disc disease. 4. Right lobe of thyroid gland nodule measuring 2.4 cm. Consider further evaluation with thyroid ultrasound. If patient is clinically hyperthyroid, consider nuclear medicine thyroid uptake and scan. 5. Critical Value/emergent results were called by telephone at the time of interpretation on 12/21/2017 at 3:53 pm to Dr. Margarita Mail , who verbally acknowledged these results.  Electronically Signed   By: Kerby Moors M.D.   On: 12/21/2017 15:54   Ct Cervical Spine Wo Contrast  Result Date: 12/21/2017 CLINICAL DATA:  Fall.  Pain in frontal head with abrasions. EXAM: CT HEAD WITHOUT CONTRAST CT CERVICAL SPINE WITHOUT CONTRAST TECHNIQUE: Multidetector CT imaging of the head and cervical spine was performed following the standard protocol without intravenous contrast. Multiplanar CT image reconstructions of the cervical spine were also generated. COMPARISON:  01/15/2016 FINDINGS: CT HEAD FINDINGS Brain: No evidence of acute infarction, hemorrhage, hydrocephalus, extra-axial collection or mass lesion/mass effect. There is  mild prominence of the sulci and ventricles identified compatible with age related brain atrophy. Vascular: No hyperdense vessel or unexpected calcification. Skull: Normal. Negative for fracture or focal lesion. Sinuses/Orbits: No acute finding. Other: None. CT CERVICAL SPINE FINDINGS Alignment: Normal. Skull base and vertebrae: There is large bulky ventral osteophytosis with ankylosis of C3 through C5 and C6-C7. There is a fracture through the ventral osteophyte at the C4 level. This does not appear to extend posteriorly into the vertebral column. The alignment is maintained. Soft tissues and spinal canal: No prevertebral fluid or swelling. No visible canal hematoma. Disc levels: Multi level disc space narrowing is noted throughout the cervical spine. Upper chest: Negative Other: Low-attenuation nodule in right lobe of thyroid gland measures 2.4 cm, image 95/series 8. IMPRESSION: 1. No acute intracranial abnormalities identified. 2. Bulky ventral cervical osteophytes with ankylosis of C3 through C5 and C6-C7. A new, acute fracture is identified through the ventral osteophyte at the C4 level. This does not appear to extend into the vertebral body. 3. Multi level degenerative disc disease. 4. Right lobe of thyroid gland nodule measuring 2.4 cm. Consider further evaluation  with thyroid ultrasound. If patient is clinically hyperthyroid, consider nuclear medicine thyroid uptake and scan. 5. Critical Value/emergent results were called by telephone at the time of interpretation on 12/21/2017 at 3:53 pm to Dr. Margarita Mail , who verbally acknowledged these results. Electronically Signed   By: Kerby Moors M.D.   On: 12/21/2017 15:54    Procedures Procedures (including critical care time)  Medications Ordered in ED Medications - No data to display   Initial Impression / Assessment and Plan / ED Course  I have reviewed the triage vital signs and the nursing notes.  Pertinent labs & imaging results that were available during my care of the patient were reviewed by me and considered in my medical decision making (see chart for details).     Patient CT scan report called to me by Dr. Clovis Riley.  Patient does have a ventral osteophyte fracture at the C4 level.  He has a history of ankylosing changes on his CT scan.  I spoke with Dr. Vertell Limber of neurosurgery who states that the patient should stay in an [place] collar until he follows up in about 2 weeks at their office.  He has minimal pain.  Patient appears appropriate for discharge to his facility.  Final Clinical Impressions(s) / ED Diagnoses   Final diagnoses:  Closed nondisplaced fracture of fourth cervical vertebra, unspecified fracture morphology, initial encounter [facility])    ED Discharge Orders    None       Margarita Mail, PA-C 12/21/17 1724    Mackuen, Fredia Sorrow, MD 12/21/17 2001

## 2017-12-21 NOTE — Discharge Instructions (Signed)
You must stay in your collar 24 hours a day even for bathing until you see Dr. Vertell Limber in follow-up.  May use Motrin and Tylenol for pain relief.

## 2018-01-01 ENCOUNTER — Telehealth: Payer: Self-pay | Admitting: Neurology

## 2018-01-01 NOTE — Telephone Encounter (Signed)
Spoke with Sonia Baller at [facility].  She states that PT is coming to see pt tomorrow, however there is no record of anyone placing referral.  Prior call was Sonia Baller trying to find out which doctor may have placed referral.

## 2018-01-01 NOTE — Telephone Encounter (Signed)
[place] called and wanted to know if Dr Delice Lesch had put in orders for some physical therapy, please call and advise CB# [phone]

## 2018-02-07 ENCOUNTER — Ambulatory Visit (INDEPENDENT_AMBULATORY_CARE_PROVIDER_SITE_OTHER): Payer: Medicare Other | Admitting: Internal Medicine

## 2018-02-07 ENCOUNTER — Other Ambulatory Visit (INDEPENDENT_AMBULATORY_CARE_PROVIDER_SITE_OTHER): Payer: Medicare Other

## 2018-02-07 ENCOUNTER — Encounter: Payer: Self-pay | Admitting: Internal Medicine

## 2018-02-07 VITALS — BP 110/54 | HR 72 | Ht 69.0 in | Wt 229.2 lb

## 2018-02-07 DIAGNOSIS — B354 Tinea corporis: Secondary | ICD-10-CM

## 2018-02-07 DIAGNOSIS — K50019 Crohn's disease of small intestine with unspecified complications: Secondary | ICD-10-CM

## 2018-02-07 DIAGNOSIS — K5080 Crohn's disease of both small and large intestine without complications: Secondary | ICD-10-CM

## 2018-02-07 DIAGNOSIS — D500 Iron deficiency anemia secondary to blood loss (chronic): Secondary | ICD-10-CM

## 2018-02-07 DIAGNOSIS — E538 Deficiency of other specified B group vitamins: Secondary | ICD-10-CM | POA: Diagnosis not present

## 2018-02-07 LAB — COMPREHENSIVE METABOLIC PANEL
ALBUMIN: 3.6 g/dL (ref 3.5–5.2)
ALT: 18 U/L (ref 0–53)
AST: 19 U/L (ref 0–37)
Alkaline Phosphatase: 174 U/L — ABNORMAL HIGH (ref 39–117)
BUN: 14 mg/dL (ref 6–23)
CALCIUM: 8.7 mg/dL (ref 8.4–10.5)
CHLORIDE: 103 meq/L (ref 96–112)
CO2: 29 mEq/L (ref 19–32)
CREATININE: 1.26 mg/dL (ref 0.40–1.50)
GFR: 61.3 mL/min (ref >60.00)
Glucose, Bld: 81 mg/dL (ref 70–99)
POTASSIUM: 4.4 meq/L (ref 3.5–5.1)
Sodium: 137 mEq/L (ref 135–145)
Total Bilirubin: 0.3 mg/dL (ref 0.2–1.2)
Total Protein: 7.3 g/dL (ref 6.0–8.3)

## 2018-02-07 LAB — CBC WITH DIFFERENTIAL/PLATELET
BASOS PCT: 1.4 % (ref 0.0–3.0)
Basophils Absolute: 0.1 10*3/uL (ref 0.0–0.1)
EOS ABS: 0 10*3/uL (ref 0.0–0.7)
Eosinophils Relative: 0 % (ref 0.0–5.0)
HCT: 37.9 % — ABNORMAL LOW (ref 39.0–52.0)
HEMOGLOBIN: 12.5 g/dL — AB (ref 13.0–17.0)
Lymphocytes Relative: 14.6 % (ref 12.0–46.0)
Lymphs Abs: 0.8 10*3/uL (ref 0.7–4.0)
MCHC: 33 g/dL (ref 30.0–36.0)
MCV: 89.8 fl (ref 78.0–100.0)
MONO ABS: 0.8 10*3/uL (ref 0.1–1.0)
Monocytes Relative: 15.3 % — ABNORMAL HIGH (ref 3.0–12.0)
NEUTROS PCT: 68.7 % (ref 43.0–77.0)
Neutro Abs: 3.7 10*3/uL (ref 1.4–7.7)
PLATELETS: 331 10*3/uL (ref 150.0–400.0)
RBC: 4.22 Mil/uL (ref 4.22–5.81)
RDW: 15.3 % (ref 11.5–15.5)
WBC: 5.4 10*3/uL (ref 4.0–10.5)

## 2018-02-07 LAB — IBC PANEL
IRON: 41 ug/dL — AB (ref 42–165)
Saturation Ratios: 12.1 % — ABNORMAL LOW (ref 20.0–50.0)
Transferrin: 242 mg/dL (ref 212.0–360.0)

## 2018-02-07 LAB — FERRITIN: FERRITIN: 8.1 ng/mL — AB (ref 22.0–322.0)

## 2018-02-07 MED ORDER — NYSTATIN 100000 UNIT/GM EX OINT: 1.0000 "application " | TOPICAL_OINTMENT | Freq: Two times a day (BID) | CUTANEOUS | 0 refills | Status: DC

## 2018-02-07 NOTE — Patient Instructions (Addendum)
We have given you a prescription of Nystatin to use twice daily to your rectal area.  Continue using Desitin on your rectal area as needed.  Continue Humira.  Continue Lialda.  Follow up with Dr Hilarie Fredrickson in July 2019.  Your provider has requested that you go to the basement level for lab work before leaving today. Press "B" on the elevator. The lab is located at the first door on the left as you exit the elevator.  If you are age 64 or older, your body mass index should be between 23-30. Your Body mass index is 33.85 kg/m. If this is out of the aforementioned range listed, please consider follow up with your Primary Care Provider.  If you are age 69 or younger, your body mass index should be between 19-25. Your Body mass index is 33.85 kg/m. If this is out of the aformentioned range listed, please consider follow up with your Primary Care Provider.

## 2018-02-08 ENCOUNTER — Encounter: Payer: Self-pay | Admitting: Internal Medicine

## 2018-02-08 NOTE — Progress Notes (Signed)
Subjective:    Patient ID: [name], male    DOB: 11-May-1956, 62 y.o.   MRN: [id number]  HPI [name] is a 62 year old male with a history of ileocolonic Crohn's disease diagnosed in 1985 status post right hemicolectomy with known Fistulizing and stricturing Crohn's disease, history of lysis of adhesions and left hemicolectomy, history of iron deficiency anemia, history of DVT now off of anticoagulation who is here for follow-up.  He is here today with his cousin.  His sister, [name], is his medical power of attorney (her phone number is [phone]).  He was last seen in the office on 10/09/2017  Since his last visit he has started Humira with standard induction and is now taking 40 mg every 14 days.  He is also using Lialda 2.4 g daily.  He reports significant improvement since starting Humira.  He has had less loose stools and also improvement in his left-sided abdominal pain.  He is tolerating the injections well.  He has not had to use antidiarrheal medications recently.  He is seen no blood in his stool or melena.  He is very happy with the response to medication as is his family.  He had 2 iron infusions for significant iron deficiency anemia.  He feels his energy levels have improved since receiving IV iron.  He had a mechanical fall on 12/21/2017 and was seen in the emergency room.  There was concern about a C4 fracture but he followed with Dr. Vertell Limber who did not feel he had a fracture but rather his imaging abnormalities were most consistent with ankylosing spondylitis.  Ankylosing spondylitis was not a previous diagnosis for him.  He has been receiving physical therapy which has been helpful.  His family communicates that his nurses aide felt like they found to red raised "bumps" on his perianal skin while helping him with bath time.  They have been applying Desitin to this area.  [name] denies any pain in the perianal skin, rectum or anus.   Review of Systems As per  HPI, otherwise negative  Current Medications, Allergies, Past Medical History, Past Surgical History, Family History and Social History were reviewed in [employer] record.      Objective:   Physical Exam BP (!) 110/54 (BP Location: Left Arm, Patient Position: Sitting, Cuff Size: Normal)   Pulse 72   Ht 5' 9"  (1.753 m)   Wt 229 lb 4 oz (104 kg)   BMI 33.85 kg/m  Constitutional: Well-developed and well-nourished. No distress. HEENT: Normocephalic and atraumatic. Oropharynx is clear and moist. Conjunctivae are normal.  No scleral icterus. Neck: Neck supple. Trachea midline. Cardiovascular: Normal rate, regular rhythm and intact distal pulses. No M/R/G Pulmonary/chest: Effort normal and breath sounds normal. No wheezing, rales or rhonchi. Abdominal: Soft, nontender, nondistended. Bowel sounds active throughout.  Rectal: External exam only, nontender, no lesions seen, skin nontender, slightly inflamed perianal skin between scrotum and anus consistent with tinea Extremities: no clubbing, cyanosis, or edema Neurological: Alert and oriented to person place and time. Skin: Skin is warm and dry. Psychiatric: Normal mood and affect. Behavior is normal.      Assessment & Plan:  62 year old male with a history of ileocolonic Crohn's disease diagnosed in 1985 status post right hemicolectomy with known Fistulizing and stricturing Crohn's disease, history of lysis of adhesions and left hemicolectomy, history of iron deficiency anemia, history of DVT now off of anticoagulation who is here for follow-up.  1.  Crohn's ileocolitis/history of fistula  and stricture --he is doing very well since starting Humira.  He has had improvement in both his loose stools and abdominal pain.  We will continue Humira 40 mg every 14 days.  I asked that he rotate sites for his injection.  Continue Lialda 2.4 g daily.  2.  Iron deficiency anemia --felt secondary to Crohn's disease.  Status post IV  iron.  Repeat CBC and iron studies today.  3.  B12 deficiency -- continue monthly IM B12   4.  Ankylosing spondylitis --  radiographic diagnosis after seeing Dr. Vertell Limber recently.  He currently has no spine symptoms attributable to this process.  Humira will/would be expected to help joint symptoms associated with both Crohn's and ankylosing spondylitis.  Should he develop more significant spine pain/dysfunction we can refer him back to Dr. Vertell Limber or rheumatology.  5.  Tinea corporis --nystatin ointment twice daily until better.  Can continue to use Desitin after bathing.  I reminded [name] to wipe well after bowel movement to ensure the perianal skin is clean.  3-4 month return office visit 25 minutes spent with the patient today. Greater than 50% was spent in counseling and coordination of care with the patient

## 2018-02-14 ENCOUNTER — Telehealth: Payer: Self-pay | Admitting: Internal Medicine

## 2018-02-15 NOTE — Telephone Encounter (Signed)
Spoke with pts HCPOA and let her know that short stay was closed for the day but I will call back on Monday and let her know.

## 2018-02-15 NOTE — Telephone Encounter (Signed)
Pt's sister calling back states she is wanting pt to be scheduled for an iron infusion best call back# [phone].

## 2018-02-18 ENCOUNTER — Other Ambulatory Visit: Payer: Self-pay

## 2018-02-18 DIAGNOSIS — D509 Iron deficiency anemia, unspecified: Secondary | ICD-10-CM

## 2018-02-18 NOTE — Telephone Encounter (Signed)
Pt scheduled at [facility] stay for 02/27/18@11am . Short stay will schedule the second appt with the pt prior to him leaving 02/27/18. Spoke with pts sister and she is aware of the appt and will let the assisted living facility know about the appt.

## 2018-02-26 ENCOUNTER — Other Ambulatory Visit (HOSPITAL_COMMUNITY): Payer: Self-pay | Admitting: *Deleted

## 2018-02-27 ENCOUNTER — Ambulatory Visit (HOSPITAL_COMMUNITY)
Admission: RE | Admit: 2018-02-27 | Discharge: 2018-02-27 | Disposition: A | Discharge status: Home / Self Care | Payer: Medicare Other | Source: Ambulatory Visit | Attending: Internal Medicine | Admitting: Internal Medicine

## 2018-02-27 DIAGNOSIS — D509 Iron deficiency anemia, unspecified: Secondary | ICD-10-CM | POA: Insufficient documentation

## 2018-02-27 MED ORDER — SODIUM CHLORIDE 0.9 % IV SOLN
510.0000 mg | INTRAVENOUS | Status: DC
  Administered 2018-02-27: 510 mg via INTRAVENOUS
  Filled 2018-02-27: qty 17

## 2018-03-01 ENCOUNTER — Telehealth: Payer: Self-pay | Admitting: Internal Medicine

## 2018-03-01 NOTE — Telephone Encounter (Signed)
April the transportation coordinator at patients assisted living home states they need to resch feraheme injection from 5.15.19 to possibly 5.14.19.

## 2018-03-04 NOTE — Telephone Encounter (Signed)
Left message for April to call [phone] to reschedule the appointment, she will need to speak with Laverne.

## 2018-03-04 NOTE — Telephone Encounter (Signed)
Have attempted to call back several times and get no answer. Will try again later.

## 2018-03-05 ENCOUNTER — Encounter: Payer: Self-pay | Admitting: Internal Medicine

## 2018-03-05 NOTE — Telephone Encounter (Signed)
Error

## 2018-03-06 ENCOUNTER — Ambulatory Visit (HOSPITAL_COMMUNITY)
Admission: RE | Admit: 2018-03-06 | Discharge: 2018-03-06 | Disposition: A | Discharge status: Home / Self Care | Payer: Medicare Other | Source: Ambulatory Visit | Attending: Internal Medicine | Admitting: Internal Medicine

## 2018-03-06 DIAGNOSIS — D509 Iron deficiency anemia, unspecified: Secondary | ICD-10-CM

## 2018-03-06 MED ORDER — SODIUM CHLORIDE 0.9 % IV SOLN
510.0000 mg | INTRAVENOUS | Status: AC
  Administered 2018-03-06: 510 mg via INTRAVENOUS
  Filled 2018-03-06: qty 17

## 2018-03-19 ENCOUNTER — Ambulatory Visit (INDEPENDENT_AMBULATORY_CARE_PROVIDER_SITE_OTHER): Payer: Medicare Other | Admitting: Podiatry

## 2018-03-19 DIAGNOSIS — M79676 Pain in unspecified toe(s): Secondary | ICD-10-CM | POA: Diagnosis not present

## 2018-03-19 DIAGNOSIS — M79609 Pain in unspecified limb: Principal | ICD-10-CM

## 2018-03-19 DIAGNOSIS — B351 Tinea unguium: Secondary | ICD-10-CM | POA: Diagnosis not present

## 2018-03-19 NOTE — Progress Notes (Signed)
  Subjective:  Patient ID: [name], male    DOB: [birth year],  MRN: [id number]  Chief Complaint  Patient presents with  . debride    B/L nail trimming -not diabetic or blood thinners   62 y.o. male returns for the above complaint. Here for nail care - denies DM or being on blood thinner.  Objective:  There were no vitals filed for this visit. General AA&O x3. Normal mood and affect.  Vascular Pedal pulses palpable.  Neurologic Epicritic sensation grossly intact.  Dermatologic Nails x10 elongated with pain to palpation, yellow discoloration.  Orthopedic: Pain to palpation about the toenails.   Assessment & Plan:  Patient was evaluated and treated and all questions answered.  Onychomycosis with pain  -Nails palliatively debrided as below. -Educated on self-care  Procedure: Nail Debridement Rationale: pain, unable to care for himself Type of Debridement: manual, sharp debridement. Instrumentation: Nail nipper, rotary burr. Number of Nails: 10    No follow-ups on file.

## 2018-03-27 ENCOUNTER — Emergency Department (HOSPITAL_COMMUNITY): Payer: Medicare Other

## 2018-03-27 ENCOUNTER — Other Ambulatory Visit: Payer: Self-pay

## 2018-03-27 ENCOUNTER — Emergency Department (HOSPITAL_COMMUNITY)
Admission: EM | Admit: 2018-03-27 | Discharge: 2018-03-27 | Disposition: A | Discharge status: Home / Self Care | Payer: Medicare Other | Attending: Physician Assistant | Admitting: Physician Assistant

## 2018-03-27 ENCOUNTER — Encounter (HOSPITAL_COMMUNITY): Payer: Self-pay

## 2018-03-27 DIAGNOSIS — M545 Low back pain: Secondary | ICD-10-CM | POA: Diagnosis not present

## 2018-03-27 DIAGNOSIS — S0990XA Unspecified injury of head, initial encounter: Secondary | ICD-10-CM | POA: Diagnosis present

## 2018-03-27 DIAGNOSIS — Z8679 Personal history of other diseases of the circulatory system: Secondary | ICD-10-CM | POA: Diagnosis not present

## 2018-03-27 DIAGNOSIS — Z79899 Other long term (current) drug therapy: Secondary | ICD-10-CM | POA: Diagnosis not present

## 2018-03-27 DIAGNOSIS — W0110XA Fall on same level from slipping, tripping and stumbling with subsequent striking against unspecified object, initial encounter: Secondary | ICD-10-CM | POA: Diagnosis not present

## 2018-03-27 DIAGNOSIS — Y999 Unspecified external cause status: Secondary | ICD-10-CM | POA: Insufficient documentation

## 2018-03-27 DIAGNOSIS — Y92002 Bathroom of unspecified non-institutional (private) residence single-family (private) house as the place of occurrence of the external cause: Secondary | ICD-10-CM | POA: Insufficient documentation

## 2018-03-27 DIAGNOSIS — S0001XA Abrasion of scalp, initial encounter: Secondary | ICD-10-CM | POA: Insufficient documentation

## 2018-03-27 DIAGNOSIS — Z7982 Long term (current) use of aspirin: Secondary | ICD-10-CM | POA: Insufficient documentation

## 2018-03-27 DIAGNOSIS — Y939 Activity, unspecified: Secondary | ICD-10-CM | POA: Diagnosis not present

## 2018-03-27 DIAGNOSIS — W19XXXA Unspecified fall, initial encounter: Secondary | ICD-10-CM

## 2018-03-27 MED ORDER — ACETAMINOPHEN 500 MG PO TABS
1000.0000 mg | ORAL_TABLET | Freq: Once | ORAL | Status: AC
  Administered 2018-03-27: 1000 mg via ORAL
  Filled 2018-03-27: qty 2

## 2018-03-27 NOTE — ED Notes (Signed)
Bed: ZE09 Expected date:  Expected time:  Means of arrival:  Comments: EMS/[facility] Fall

## 2018-03-27 NOTE — ED Triage Notes (Signed)
Pt arrived via [facility] from [facility]. Pt had unwitnessed fall in the bathroom. Pt was fnd on floor in bathroom and is c/o back pain pt uses cane and was not using assistive device. EMS reports that pt was able to ambulate and stand to get on the stretcher and then reported to EMS pain was no longer present. Pt family  Requested pt to be brought to [facility].   EMS v/s 160/80 HR 80 RR 16 O2 sat

## 2018-03-27 NOTE — Discharge Instructions (Signed)
You can take 1000 mg of Tylenol.  Do not exceed 4000 mg of Tylenol a day.  Follow-up with your primary care doctor in the next 2 to 4 days.  As we discussed, you recommend physical therapy to help with patient's ambulating.  Please call your primary care doctor to help arrange for this.  Return to emergency department for any vision change, vomiting, chest pain, difficulty breathing, difficulty walking, numbness/weakness of your arms or legs.

## 2018-03-27 NOTE — ED Triage Notes (Signed)
[name] [phone]

## 2018-03-27 NOTE — ED Provider Notes (Signed)
[facility] DEPT Provider Note   CSN: [id number] Arrival date & time: 03/27/18  1128     History   Chief Complaint Chief Complaint  Patient presents with  . Fall    HPI [name] is a 62 y.o. male past medical history of Crohn's, mental disability presents for evaluation of mechanical fall that occurred just prior to arrival.  Patient reports he was walking better when he tripped and fell.  No preceding chest pain or dizziness.  Patient reports that he landed forward, and hit his head.  Patient states he did not have any LOC.  Patient reports that his glasses broke and scratch his face.  Patient states that he remembers the entire event.  Patient reports he was complaining of some lower back pain.  On EMS arrival, patient was able to ambulate and stand up and reported that his pain was improved.  He reports that after lying on the stretcher in the EMS truck, his back pain has returned.  Patient is not currently on any blood thinners.  Patient denies any neck pain, vision changes, chest pain, difficulty breathing, dizziness, numbness/weakness of his arms or legs, abdominal pain.  The history is provided by the patient.    Past Medical History:  Diagnosis Date  . Abscess of anal and rectal regions   . Crohn's disease ([place])   . DVT (deep venous thrombosis) ([place])   . Epilepsy ([place])    "petit mal; only at home" (6/9//2015)  . Heart murmur   . History of blood transfusion ~ 1992   "related to my Crohn's"  . History of stomach ulcers   . Hyperplastic colon polyp   . Iron deficiency anemia, unspecified   . Mental disability   . Other B-complex deficiencies   . REGIONAL ENTERITIS, LARGE INTESTINE 10/05/2000   Qualifier: Diagnosis of  By: Jerral Ralph    . S/P partial colectomy for Crohn's disease 12/01/2015  . SBO (small bowel obstruction) ([place]) 12/01/2015  . Syncope 03/31/2014  . Syncope and collapse 03/31/2014   "while driving"    Patient  Active Problem List   Diagnosis Date Noted  . Mental disability   . S/P colostomy takedown 08/01/2016  . Crohn's disease of both small and large intestine with fistula ([place])   . Localization-related idiopathic epilepsy and epileptic syndromes with seizures of localized onset, not intractable, without status epilepticus ([place]) 03/30/2015  . Aortic valve stenosis, mild 03/31/2014  . Aortic valve regurgitation 03/31/2014  . Pseudopolyposis of colon ([place]) 01/03/2012  . Crohn's disease ([place]) 12/19/2011  . Personal history of immunosupression therapy 12/19/2011  . ECZEMA 08/21/2008  . CHOLELITHIASIS, ASYMPTOMATIC 01/29/2008  . VITAMIN B12 DEFICIENCY 01/17/2008    Past Surgical History:  Procedure Laterality Date  . COLON SURGERY     Anastomosis 45cm from anal verge c/w deswecending colon anastomosis  . COLONOSCOPY  2013, 2016  . COLOSTOMY TAKEDOWN N/A 08/01/2016   Procedure: LAPAROSCOPIC COLOSTOMY REVERSAL;  Surgeon: Alphonsa Overall, MD;  Location: [facility];  Service: General;  Laterality: N/A;  . [place]   ileocectomy   . HERNIA REPAIR    . LAPAROSCOPY N/A 12/04/2015   Procedure: LAPAROSCOPY DIAGNOSTIC, LAPAROSCOPIC INTEROLYSIS OF ADHESIONS, HAND- ASSISTED SMALL BOWEL RESECTION, MOBILIZATION OF SPLENIC FLEXURE, LEFT COLECTOMY WITH CREATION OF COLOSTOMY (HARTMAN'S PROCEDURE);  Surgeon: Alphonsa Overall, MD;  Location: [facility];  Service: General;  Laterality: N/A;  . PILONIDAL CYST EXCISION  1980's  . TONSILLECTOMY  ~ 1967  . UMBILICAL  HERNIA REPAIR  ~ 1983        Home Medications    Prior to Admission medications   Medication Sig Start Date End Date Taking? Authorizing Provider  acetaminophen (TYLENOL) 325 MG tablet Take 650 mg by mouth every 6 (six) hours as needed for moderate pain.   Yes [provider]  Adalimumab (HUMIRA) 40 MG/0.4ML PSKT Inject 40 mg into the skin every 14 (fourteen) days. 11/16/17  Yes Pyrtle, Lajuan Lines, MD  aspirin 81 MG chewable tablet Chew by mouth  daily.   Yes [provider]  cyanocobalamin (,VITAMIN B-12,) 1000 MCG/ML injection Inject 1 mL (1,000 mcg total) into the muscle every 30 (thirty) days. 02/08/16  Yes Pyrtle, Lajuan Lines, MD  diphenhydrAMINE (BENADRYL) 25 mg capsule Take 25 mg by mouth every 4 (four) hours as needed (for allergic reaction).   Yes [provider]  furosemide (LASIX) 20 MG tablet Take 20 mg by mouth 2 (two) times daily.   Yes [provider]  guaifenesin (ROBITUSSIN) 100 MG/5ML syrup Take 200 mg by mouth every 6 (six) hours as needed for cough (Do not exceed 4 doses in 24 hours. If cough has not improved in 24 hours notify physician.).   Yes [provider]  ipratropium (ATROVENT) 0.03 % nasal spray Place 1 spray into both nostrils 2 (two) times daily.  10/22/16  Yes [provider]  levETIRAcetam (KEPPRA) 500 MG tablet Take 1 tablet (500 mg total) by mouth 2 (two) times daily. 12/05/17  Yes Cameron Sprang, MD  loperamide (IMODIUM A-D) 2 MG tablet Take 2 mg by mouth 4 (four) times daily as needed for diarrhea or loose stools. With each loose stool up to 8 doses in 24 hours   Yes [provider]  loratadine (CLARITIN) 10 MG tablet Take 10 mg by mouth daily.   Yes [provider]  meclizine (ANTIVERT) 25 MG tablet Take 25 mg by mouth every 6 (six) hours as needed for dizziness.  11/07/16  Yes [provider]  mesalamine (LIALDA) 1.2 g EC tablet Take 2 tablets (2.4 g total) by mouth daily with breakfast. 10/09/17  Yes Pyrtle, Lajuan Lines, MD  Multiple Vitamins-Iron (MULTIVITAMINS WITH IRON) TABS tablet Take 1 tablet by mouth daily.   Yes [provider]  ondansetron (ZOFRAN) 4 MG tablet Take 4 mg by mouth every 6 (six) hours as needed for nausea or vomiting.   Yes [provider]  potassium chloride 20 MEQ/15ML (10%) SOLN Take 20 mEq by mouth daily.   Yes [provider]  primidone (MYSOLINE) 250 MG tablet Take 2 tablets (500 mg total) by  mouth 2 (two) times daily. 12/05/17  Yes Cameron Sprang, MD  Vitamin D, Ergocalciferol, (DRISDOL) 50000 units CAPS capsule Take 50,000 Units by mouth every 7 (seven) days.   Yes [provider]  nystatin ointment (MYCOSTATIN) Apply 1 application topically 2 (two) times daily. To the perianal area Patient not taking: Reported on 03/27/2018 02/07/18   Pyrtle, Lajuan Lines, MD    Family History Family History  Problem Relation Age of Onset  . Skin cancer Father   . Colon cancer Mother   . Seizures Neg Hx   . Crohn's disease Neg Hx     Social History Social History   Tobacco Use  . Smoking status: Never Smoker  . Smokeless tobacco: Never Used  Substance Use Topics  . Alcohol use: No    Alcohol/week: 0.0 oz  . Drug use: No  Allergies   Penicillins   Review of Systems Review of Systems  Constitutional: Negative for fever.  Eyes: Negative for visual disturbance.  Respiratory: Negative for cough and shortness of breath.   Cardiovascular: Negative for chest pain.  Gastrointestinal: Negative for abdominal pain, nausea and vomiting.  Musculoskeletal: Positive for back pain. Negative for neck pain.  Neurological: Negative for dizziness, weakness, numbness and headaches.  All other systems reviewed and are negative.    Physical Exam Updated Vital Signs BP 138/65 (BP Location: Left Arm)   Pulse 74   Temp 98.3 F (36.8 C) (Oral)   Resp 18   SpO2 98%   Physical Exam  Constitutional: He is oriented to person, place, and time. He appears well-developed and well-nourished.  HENT:  Head: Normocephalic and atraumatic.    Mouth/Throat: Oropharynx is clear and moist and mucous membranes are normal.  Besides small abrasion noted to frontal forehead, no other wounds, lacerations, abrasions.  No skull deformity or crepitus noted.  Eyes: Pupils are equal, round, and reactive to light. Conjunctivae, EOM and lids are normal.  Neck: Full passive range of motion without pain.  Full  flexion/extension and lateral movement of neck fully intact. No bony midline tenderness. No deformities or crepitus.   Cardiovascular: Normal rate, regular rhythm, normal heart sounds and normal pulses. Exam reveals no gallop and no friction rub.  No murmur heard. Pulmonary/Chest: Effort normal and breath sounds normal.  Abdominal: Soft. Normal appearance. There is no tenderness. There is no rigidity and no guarding.  Musculoskeletal: Normal range of motion.       Thoracic back: He exhibits no tenderness.       Lumbar back: He exhibits tenderness.  Tenderness noted to the lower lumbar region and at the midline.  No deformity or crepitus noted.  No step-offs. No tenderness to palpation to bilateral shoulders, clavicles, elbows, and wrists. No deformities or crepitus noted. FROM of BUE without difficulty. No tenderness to palpation to bilateral knees and ankles. No deformities or crepitus noted. FROM of BLE without any difficulty.   Neurological: He is alert and oriented to person, place, and time.  Cranial nerves III-XII intact Follows commands, Moves all extremities  5/5 strength to BUE and BLE  Sensation intact throughout all major nerve distributions Normal coordiantion No pronator drift.  No slurred speech. No facial droop.  Dorsiflexion and Plantarflexion intact without difficulty.   Skin: Skin is warm and dry. Capillary refill takes less than 2 seconds.  Psychiatric: He has a normal mood and affect. His speech is normal.  Nursing note and vitals reviewed.    ED Treatments / Results  Labs (all labs ordered are listed, but only abnormal results are displayed) Labs Reviewed - No data to display  EKG None  Radiology Dg Lumbar Spine Complete  Result Date: 03/27/2018 CLINICAL DATA:  Following fall EXAM: LUMBAR SPINE - COMPLETE 4+ VIEW COMPARISON:  None. FINDINGS: Frontal, lateral, spot lumbosacral lateral, and bilateral oblique views were obtained. There are 5 non-rib-bearing lumbar  type vertebral bodies. No fracture or spondylolisthesis evident. There is disc space narrowing at L5-S1. Other disc spaces appear unremarkable. There is syndesmophytic change at multiple levels as well as several osteophytes in the lower thoracic and lumbar regions. There is facet osteoarthritic change at L3-4, L4-5, and L5-S1 bilaterally. IMPRESSION: No acute fracture or spondylolisthesis. There are both osteophytes and syndesmophytes present. Suspect degree of underlying seronegative spondyloarthropathy as well as osteoarthritic change. Electronically Signed   By: Lowella Grip III M.D.  On: 03/27/2018 12:53   Ct Head Wo Contrast  Result Date: 03/27/2018 CLINICAL DATA:  62 year old male with head trauma from fall today. Initial encounter. EXAM: CT HEAD WITHOUT CONTRAST TECHNIQUE: Contiguous axial images were obtained from the base of the skull through the vertex without intravenous contrast. COMPARISON:  12/21/2017 and prior CTs FINDINGS: Brain: No evidence of acute infarction, hemorrhage, hydrocephalus, extra-axial collection or mass lesion/mass effect. Very mild volume loss again noted. Vascular: Mild atherosclerotic calcifications. Skull: Unremarkable Sinuses/Orbits: No new abnormalities. A LEFT mastoid effusion again noted. Other: None IMPRESSION: 1. No evidence of acute intracranial abnormality 2. Unchanged LEFT mastoid effusion. Electronically Signed   By: Margarette Canada M.D.   On: 03/27/2018 13:07    Procedures Procedures (including critical care time)  Medications Ordered in ED Medications  acetaminophen (TYLENOL) tablet 1,000 mg (1,000 mg Oral Given 03/27/18 1421)     Initial Impression / Assessment and Plan / ED Course  I have reviewed the triage vital signs and the nursing notes.  Pertinent labs & imaging results that were available during my care of the patient were reviewed by me and considered in my medical decision making (see chart for details).     62 year old male who  presents for evaluation of fall that occurred just prior to arrival.  Fall was unwitnessed.  EMS found patient on the bathroom.  Patient supposed to be ambulating with his assistance advised but states he was not.  Patient reports he tripped and fell, causing him to lean forward.  No preceding chest pain, dizziness.  Reports some lower back pain.  Had improved but states that after laying on the stretcher in the ambulance, it returned.  No vision changes, numbness/weakness.  No neck pain. Patient is afebrile, non-toxic appearing, sitting comfortably on examination table. Vital signs reviewed and stable.  On exam, patient has a small abrasion to the frontal forehead.  No evidence of laceration.  Patient with some lower lumbar pain.  No other abnormalities on physical exam.  He is on baby aspirin.  Given history of mental disability and on with small, will plan is to check CT head.  Will plan for lumbar x-ray.  CT head negative for any acute abnormalities.  Lumbar x-ray negative for any acute bony abnormalities.  Discussed results with patient and cousin who is here.  We will plan to ambulate patient.  Patient able to ambulate with the assistance of a walker.  Cousin states that his gait appears slightly abnormal but he was able to walk without difficulty.  Reports at baseline, he has difficulty walking. Dr. Thomasene Lot and I discussed with patient and cousin regarding further work-up here in the ED.  At this time, patient has full range of motion of bilateral lower extremities.  No evidence of weakness.  Strength is normal.  Dorsiflexion plantarflexion intact without any difficulty.  Do not suspect occult hip fracture given the patient is able to bear weight and can ambulate.  Additionally, patient has a normal neuro exam.  Do not suspect CVA at this time.  We discussed at length with both patient and cousin and offered to do further evaluation.  Cousin states that at baseline, he has a lot of difficulty walking.   Does not wish to pursue any further work-up.  At this time I feel that this is reasonable given reassuring work-up and exam. Vital signs are stable.  Patient stable for discharge at this time.  Will plan for discharge home.  Instructed patient follow-up with his  Primary care doctor for further evaluation.  Patient had ample opportunity for questions and discussion. All patient's questions were answered with full understanding. Strict return precautions discussed. Patient expresses understanding and agreement to plan.   Final Clinical Impressions(s) / ED Diagnoses   Final diagnoses:  Fall, initial encounter  Abrasion of scalp, initial encounter    ED Discharge Orders    None       Volanda Napoleon, PA-C 03/27/18 1615    Macarthur Critchley, MD 03/28/18 [phone]

## 2018-03-28 ENCOUNTER — Emergency Department (HOSPITAL_COMMUNITY): Payer: Medicare Other

## 2018-03-28 ENCOUNTER — Encounter (HOSPITAL_COMMUNITY): Payer: Self-pay | Admitting: Emergency Medicine

## 2018-03-28 ENCOUNTER — Emergency Department (HOSPITAL_COMMUNITY)
Admission: EM | Admit: 2018-03-28 | Discharge: 2018-03-28 | Disposition: A | Discharge status: Home / Self Care | Payer: Medicare Other | Attending: Emergency Medicine | Admitting: Emergency Medicine

## 2018-03-28 ENCOUNTER — Other Ambulatory Visit: Payer: Self-pay

## 2018-03-28 DIAGNOSIS — Y939 Activity, unspecified: Secondary | ICD-10-CM | POA: Insufficient documentation

## 2018-03-28 DIAGNOSIS — Y929 Unspecified place or not applicable: Secondary | ICD-10-CM | POA: Diagnosis not present

## 2018-03-28 DIAGNOSIS — S0990XA Unspecified injury of head, initial encounter: Secondary | ICD-10-CM | POA: Diagnosis present

## 2018-03-28 DIAGNOSIS — Z79899 Other long term (current) drug therapy: Secondary | ICD-10-CM | POA: Insufficient documentation

## 2018-03-28 DIAGNOSIS — Y999 Unspecified external cause status: Secondary | ICD-10-CM | POA: Insufficient documentation

## 2018-03-28 DIAGNOSIS — W19XXXA Unspecified fall, initial encounter: Secondary | ICD-10-CM

## 2018-03-28 DIAGNOSIS — W0110XA Fall on same level from slipping, tripping and stumbling with subsequent striking against unspecified object, initial encounter: Secondary | ICD-10-CM | POA: Diagnosis not present

## 2018-03-28 NOTE — Discharge Instructions (Signed)
Return here as needed.  Follow-up with your primary doctor.  Your CT scans did not show any abnormality at this time.

## 2018-03-28 NOTE — ED Provider Notes (Signed)
[facility] EMERGENCY DEPARTMENT Provider Note   CSN: [id number] Arrival date & time: 03/28/18  0555     History   Chief Complaint Chief Complaint  Patient presents with  . Fall    HPI ARMAAN POND is a 62 y.o. male.  HPI Patient presents to the emergency department with another mechanical fall that occurred last night.  The patient states that he when he got up off the clothes and started ambulating he tripped and fell forward striking his head.  Patient states that nothing seems to make the condition better or worse.  The patient states that he is having manages head pain and no other pain in the extremities.  Patient states that he did not take any medications prior to arrival for his symptoms.  The patient denies chest pain, shortness of breath, headache,blurred vision, neck pain, fever, cough, weakness, numbness, dizziness, anorexia, edema, abdominal pain, nausea, vomiting, diarrhea, rash, back pain, dysuria, hematemesis, bloody stool, near syncope, or syncope. Past Medical History:  Diagnosis Date  . Abscess of anal and rectal regions   . Crohn's disease ([place])   . DVT (deep venous thrombosis) ([place])   . Epilepsy ([place])    "petit mal; only at home" (6/9//2015)  . Heart murmur   . History of blood transfusion ~ 1992   "related to my Crohn's"  . History of stomach ulcers   . Hyperplastic colon polyp   . Iron deficiency anemia, unspecified   . Mental disability   . Other B-complex deficiencies   . REGIONAL ENTERITIS, LARGE INTESTINE 10/05/2000   Qualifier: Diagnosis of  By: Jerral Ralph    . S/P partial colectomy for Crohn's disease 12/01/2015  . SBO (small bowel obstruction) ([place]) 12/01/2015  . Syncope 03/31/2014  . Syncope and collapse 03/31/2014   "while driving"    Patient Active Problem List   Diagnosis Date Noted  . Mental disability   . S/P colostomy takedown 08/01/2016  . Crohn's disease of both small and large intestine with fistula  ([place])   . Localization-related idiopathic epilepsy and epileptic syndromes with seizures of localized onset, not intractable, without status epilepticus ([place]) 03/30/2015  . Aortic valve stenosis, mild 03/31/2014  . Aortic valve regurgitation 03/31/2014  . Pseudopolyposis of colon ([place]) 01/03/2012  . Crohn's disease ([place]) 12/19/2011  . Personal history of immunosupression therapy 12/19/2011  . ECZEMA 08/21/2008  . CHOLELITHIASIS, ASYMPTOMATIC 01/29/2008  . VITAMIN B12 DEFICIENCY 01/17/2008    Past Surgical History:  Procedure Laterality Date  . COLON SURGERY     Anastomosis 45cm from anal verge c/w deswecending colon anastomosis  . COLONOSCOPY  2013, 2016  . COLOSTOMY TAKEDOWN N/A 08/01/2016   Procedure: LAPAROSCOPIC COLOSTOMY REVERSAL;  Surgeon: Alphonsa Overall, MD;  Location: [facility];  Service: General;  Laterality: N/A;  . [place]   ileocectomy   . HERNIA REPAIR    . LAPAROSCOPY N/A 12/04/2015   Procedure: LAPAROSCOPY DIAGNOSTIC, LAPAROSCOPIC INTEROLYSIS OF ADHESIONS, HAND- ASSISTED SMALL BOWEL RESECTION, MOBILIZATION OF SPLENIC FLEXURE, LEFT COLECTOMY WITH CREATION OF COLOSTOMY (HARTMAN'S PROCEDURE);  Surgeon: Alphonsa Overall, MD;  Location: [facility];  Service: General;  Laterality: N/A;  . PILONIDAL CYST EXCISION  1980's  . TONSILLECTOMY  ~ 1967  . UMBILICAL HERNIA REPAIR  ~ 1983        Home Medications    Prior to Admission medications   Medication Sig Start Date End Date Taking? Authorizing Provider  acetaminophen (TYLENOL) 325 MG tablet Take 650 mg by mouth  every 6 (six) hours as needed for moderate pain.    [provider]  Adalimumab (HUMIRA) 40 MG/0.4ML PSKT Inject 40 mg into the skin every 14 (fourteen) days. 11/16/17   Pyrtle, Lajuan Lines, MD  aspirin 81 MG chewable tablet Chew by mouth daily.    [provider]  cyanocobalamin (,VITAMIN B-12,) 1000 MCG/ML injection Inject 1 mL (1,000 mcg total) into the muscle every 30 (thirty) days. 02/08/16   Pyrtle,  Lajuan Lines, MD  diphenhydrAMINE (BENADRYL) 25 mg capsule Take 25 mg by mouth every 4 (four) hours as needed (for allergic reaction).    [provider]  furosemide (LASIX) 20 MG tablet Take 20 mg by mouth 2 (two) times daily.    [provider]  guaifenesin (ROBITUSSIN) 100 MG/5ML syrup Take 200 mg by mouth every 6 (six) hours as needed for cough (Do not exceed 4 doses in 24 hours. If cough has not improved in 24 hours notify physician.).    [provider]  ipratropium (ATROVENT) 0.03 % nasal spray Place 1 spray into both nostrils 2 (two) times daily.  10/22/16   [provider]  levETIRAcetam (KEPPRA) 500 MG tablet Take 1 tablet (500 mg total) by mouth 2 (two) times daily. 12/05/17   Cameron Sprang, MD  loperamide (IMODIUM A-D) 2 MG tablet Take 2 mg by mouth 4 (four) times daily as needed for diarrhea or loose stools. With each loose stool up to 8 doses in 24 hours    [provider]  loratadine (CLARITIN) 10 MG tablet Take 10 mg by mouth daily.    [provider]  meclizine (ANTIVERT) 25 MG tablet Take 25 mg by mouth every 6 (six) hours as needed for dizziness.  11/07/16   [provider]  mesalamine (LIALDA) 1.2 g EC tablet Take 2 tablets (2.4 g total) by mouth daily with breakfast. 10/09/17   Pyrtle, Lajuan Lines, MD  Multiple Vitamins-Iron (MULTIVITAMINS WITH IRON) TABS tablet Take 1 tablet by mouth daily.    [provider]  nystatin ointment (MYCOSTATIN) Apply 1 application topically 2 (two) times daily. To the perianal area Patient not taking: Reported on 03/27/2018 02/07/18   Pyrtle, Lajuan Lines, MD  ondansetron (ZOFRAN) 4 MG tablet Take 4 mg by mouth every 6 (six) hours as needed for nausea or vomiting.    [provider]  potassium chloride 20 MEQ/15ML (10%) SOLN Take 20 mEq by mouth daily.    [provider]  primidone (MYSOLINE) 250 MG tablet Take 2 tablets (500 mg total) by mouth 2 (two) times daily. 12/05/17   Cameron Sprang, MD  Vitamin D, Ergocalciferol, (DRISDOL) 50000 units CAPS capsule Take 50,000 Units by mouth every 7 (seven) days.    [provider]    Family History Family History  Problem Relation Age of Onset  . Skin cancer Father   . Colon cancer Mother   . Seizures Neg Hx   . Crohn's disease Neg Hx     Social History Social History   Tobacco Use  . Smoking status: Never Smoker  . Smokeless tobacco: Never Used  Substance Use Topics  . Alcohol use: No    Alcohol/week: 0.0 oz  . Drug use: No     Allergies   Penicillins   Review of Systems Review of Systems All other systems negative except as documented in the HPI. All pertinent positives and negatives as reviewed in the HPI.  Physical Exam Updated Vital Signs BP [name])  102/57   Pulse 74   Temp 98.4 F (36.9 C) (Oral)   Resp 14   SpO2 100%   Physical Exam  Constitutional: He is oriented to person, place, and time. He appears well-developed and well-nourished. No distress.  HENT:  Head: Normocephalic and atraumatic.  Mouth/Throat: Oropharynx is clear and moist.  Eyes: Pupils are equal, round, and reactive to light.  Neck: Normal range of motion. Neck supple.  Cardiovascular: Normal rate, regular rhythm and normal heart sounds. Exam reveals no gallop and no friction rub.  No murmur heard. Pulmonary/Chest: Effort normal and breath sounds normal. No respiratory distress. He has no wheezes.  Abdominal: Soft. Bowel sounds are normal. He exhibits no distension. There is no tenderness.  Neurological: He is alert and oriented to person, place, and time. He exhibits normal muscle tone. Coordination normal.  Skin: Skin is warm and dry. Capillary refill takes less than 2 seconds. No rash noted. No erythema.  Psychiatric: He has a normal mood and affect. His behavior is normal.  Nursing note and vitals reviewed.    ED Treatments / Results  Labs (all labs ordered are listed, but only abnormal results are  displayed) Labs Reviewed - No data to display  EKG None  Radiology Dg Lumbar Spine Complete  Result Date: 03/27/2018 CLINICAL DATA:  Following fall EXAM: LUMBAR SPINE - COMPLETE 4+ VIEW COMPARISON:  None. FINDINGS: Frontal, lateral, spot lumbosacral lateral, and bilateral oblique views were obtained. There are 5 non-rib-bearing lumbar type vertebral bodies. No fracture or spondylolisthesis evident. There is disc space narrowing at L5-S1. Other disc spaces appear unremarkable. There is syndesmophytic change at multiple levels as well as several osteophytes in the lower thoracic and lumbar regions. There is facet osteoarthritic change at L3-4, L4-5, and L5-S1 bilaterally. IMPRESSION: No acute fracture or spondylolisthesis. There are both osteophytes and syndesmophytes present. Suspect degree of underlying seronegative spondyloarthropathy as well as osteoarthritic change. Electronically Signed   By: Lowella Grip III M.D.   On: 03/27/2018 12:53   Ct Head Wo Contrast  Result Date: 03/28/2018 CLINICAL DATA:  Head trauma. EXAM: CT HEAD WITHOUT CONTRAST CT CERVICAL SPINE WITHOUT CONTRAST TECHNIQUE: Multidetector CT imaging of the head and cervical spine was performed following the standard protocol without intravenous contrast. Multiplanar CT image reconstructions of the cervical spine were also generated. COMPARISON:  CT head 03/27/2018.  Plain films 12/28/2017. FINDINGS: CT HEAD FINDINGS Brain: No acute intracranial abnormality. Specifically, no hemorrhage, hydrocephalus, mass lesion, acute infarction, or significant intracranial injury. Vascular: No hyperdense vessel or unexpected calcification. Skull: No acute calvarial abnormality. Sinuses/Orbits: Visualized paranasal sinuses and mastoids clear. Orbital soft tissues unremarkable. Other: None CT CERVICAL SPINE FINDINGS Alignment: Normal Skull base and vertebrae: No fracture Soft tissues and spinal canal: Prevertebral soft tissues are normal. No epidural  or paraspinal hematoma. Disc levels: Large flowing osteophytes noted anteriorly throughout the cervical spine. Disc space narrowing. Mild diffuse degenerative facet disease. Upper chest: No acute findings Other: Bilateral thyroid nodules most compatible with multinodular goiter. The largest is in the right lobe measuring 2 cm. IMPRESSION: No acute intracranial abnormality. Large flowing anterior osteophytes throughout the cervical spine. No acute bony abnormality in the cervical spine. Multiple bilateral thyroid nodules, likely multinodular goiter. These could be further characterized with elective thyroid ultrasound. Electronically Signed   By: Rolm Baptise M.D.   On: 03/28/2018 08:16   Ct Head Wo Contrast  Result Date: 03/27/2018 CLINICAL DATA:  62 year old male with head trauma from fall today. Initial encounter. EXAM:  CT HEAD WITHOUT CONTRAST TECHNIQUE: Contiguous axial images were obtained from the base of the skull through the vertex without intravenous contrast. COMPARISON:  12/21/2017 and prior CTs FINDINGS: Brain: No evidence of acute infarction, hemorrhage, hydrocephalus, extra-axial collection or mass lesion/mass effect. Very mild volume loss again noted. Vascular: Mild atherosclerotic calcifications. Skull: Unremarkable Sinuses/Orbits: No new abnormalities. A LEFT mastoid effusion again noted. Other: None IMPRESSION: 1. No evidence of acute intracranial abnormality 2. Unchanged LEFT mastoid effusion. Electronically Signed   By: Margarette Canada M.D.   On: 03/27/2018 13:07   Ct Cervical Spine Wo Contrast  Result Date: 03/28/2018 CLINICAL DATA:  Head trauma. EXAM: CT HEAD WITHOUT CONTRAST CT CERVICAL SPINE WITHOUT CONTRAST TECHNIQUE: Multidetector CT imaging of the head and cervical spine was performed following the standard protocol without intravenous contrast. Multiplanar CT image reconstructions of the cervical spine were also generated. COMPARISON:  CT head 03/27/2018.  Plain films 12/28/2017.  FINDINGS: CT HEAD FINDINGS Brain: No acute intracranial abnormality. Specifically, no hemorrhage, hydrocephalus, mass lesion, acute infarction, or significant intracranial injury. Vascular: No hyperdense vessel or unexpected calcification. Skull: No acute calvarial abnormality. Sinuses/Orbits: Visualized paranasal sinuses and mastoids clear. Orbital soft tissues unremarkable. Other: None CT CERVICAL SPINE FINDINGS Alignment: Normal Skull base and vertebrae: No fracture Soft tissues and spinal canal: Prevertebral soft tissues are normal. No epidural or paraspinal hematoma. Disc levels: Large flowing osteophytes noted anteriorly throughout the cervical spine. Disc space narrowing. Mild diffuse degenerative facet disease. Upper chest: No acute findings Other: Bilateral thyroid nodules most compatible with multinodular goiter. The largest is in the right lobe measuring 2 cm. IMPRESSION: No acute intracranial abnormality. Large flowing anterior osteophytes throughout the cervical spine. No acute bony abnormality in the cervical spine. Multiple bilateral thyroid nodules, likely multinodular goiter. These could be further characterized with elective thyroid ultrasound. Electronically Signed   By: Rolm Baptise M.D.   On: 03/28/2018 08:16    Procedures Procedures (including critical care time)  Medications Ordered in ED Medications - No data to display   Initial Impression / Assessment and Plan / ED Course  I have reviewed the triage vital signs and the nursing notes.  Pertinent labs & imaging results that were available during my care of the patient were reviewed by me and considered in my medical decision making (see chart for details).     Patient has no CT scan findings that show acute abnormality at this time.  Patient will be advised to follow-up with his primary doctor.  Told to return here as needed.  Patient's hips were examined and showed no decreased range of motion or pain on  examination.  Final Clinical Impressions(s) / ED Diagnoses   Final diagnoses:  None    ED Discharge Orders    None       Dalia Heading, PA-C 03/28/18 [place], MD 03/29/18 [phone]

## 2018-03-28 NOTE — ED Triage Notes (Signed)
Patient presents to the ED with complaints of Fall. Per EMS patient got out of bed to go to the restroom, and fell while getting off the toliet. Patient denies any LOC states hit head on wall. Patient denies on any blood thinners.

## 2018-03-28 NOTE — ED Notes (Signed)
Pt d/c to [facility] in [place] with nursing facility transport service. Report given to [facility] by [employer].

## 2018-04-12 ENCOUNTER — Other Ambulatory Visit: Payer: Self-pay

## 2018-04-12 ENCOUNTER — Emergency Department (HOSPITAL_COMMUNITY)
Admission: EM | Admit: 2018-04-12 | Discharge: 2018-04-12 | Disposition: A | Discharge status: Home / Self Care | Payer: Medicare Other | Attending: Emergency Medicine | Admitting: Emergency Medicine

## 2018-04-12 ENCOUNTER — Emergency Department (HOSPITAL_COMMUNITY): Payer: Medicare Other

## 2018-04-12 ENCOUNTER — Encounter (HOSPITAL_COMMUNITY): Payer: Self-pay

## 2018-04-12 DIAGNOSIS — W050XXA Fall from non-moving wheelchair, initial encounter: Secondary | ICD-10-CM | POA: Insufficient documentation

## 2018-04-12 DIAGNOSIS — Y9389 Activity, other specified: Secondary | ICD-10-CM | POA: Diagnosis not present

## 2018-04-12 DIAGNOSIS — Y998 Other external cause status: Secondary | ICD-10-CM | POA: Insufficient documentation

## 2018-04-12 DIAGNOSIS — Y92129 Unspecified place in nursing home as the place of occurrence of the external cause: Secondary | ICD-10-CM | POA: Diagnosis not present

## 2018-04-12 DIAGNOSIS — W19XXXA Unspecified fall, initial encounter: Secondary | ICD-10-CM

## 2018-04-12 DIAGNOSIS — Z79899 Other long term (current) drug therapy: Secondary | ICD-10-CM | POA: Diagnosis not present

## 2018-04-12 DIAGNOSIS — Z7982 Long term (current) use of aspirin: Secondary | ICD-10-CM | POA: Diagnosis not present

## 2018-04-12 DIAGNOSIS — S0083XA Contusion of other part of head, initial encounter: Secondary | ICD-10-CM | POA: Diagnosis not present

## 2018-04-12 DIAGNOSIS — S0990XA Unspecified injury of head, initial encounter: Secondary | ICD-10-CM | POA: Diagnosis present

## 2018-04-12 DIAGNOSIS — R339 Retention of urine, unspecified: Secondary | ICD-10-CM | POA: Insufficient documentation

## 2018-04-12 LAB — COMPREHENSIVE METABOLIC PANEL
ALBUMIN: 3.5 g/dL (ref 3.5–5.0)
ALK PHOS: 184 U/L — AB (ref 38–126)
ALT: 19 U/L (ref 17–63)
AST: 19 U/L (ref 15–41)
Anion gap: 6 (ref 5–15)
BILIRUBIN TOTAL: 0.4 mg/dL (ref 0.3–1.2)
BUN: 16 mg/dL (ref 6–20)
CALCIUM: 8.8 mg/dL — AB (ref 8.9–10.3)
CO2: 23 mmol/L (ref 22–32)
Chloride: 110 mmol/L (ref 101–111)
Creatinine, Ser: 1.35 mg/dL — ABNORMAL HIGH (ref 0.61–1.24)
GFR calc Af Amer: >60 mL/min (ref >60)
GFR calc non Af Amer: 54 mL/min — ABNORMAL LOW (ref >60)
Glucose, Bld: 98 mg/dL (ref 65–99)
Potassium: 4.2 mmol/L (ref 3.5–5.1)
SODIUM: 139 mmol/L (ref 135–145)
TOTAL PROTEIN: 7.8 g/dL (ref 6.5–8.1)

## 2018-04-12 LAB — URINALYSIS, ROUTINE W REFLEX MICROSCOPIC
Bilirubin Urine: NEGATIVE
Glucose, UA: NEGATIVE mg/dL
KETONES UR: NEGATIVE mg/dL
Leukocytes, UA: NEGATIVE
Nitrite: NEGATIVE
PH: 5 (ref 5.0–8.0)
PROTEIN: NEGATIVE mg/dL
Specific Gravity, Urine: 1.011 (ref 1.005–1.030)

## 2018-04-12 LAB — CBC
HEMATOCRIT: 42.1 % (ref 39.0–52.0)
HEMOGLOBIN: 13.4 g/dL (ref 13.0–17.0)
MCH: 30.7 pg (ref 26.0–34.0)
MCHC: 31.8 g/dL (ref 30.0–36.0)
MCV: 96.6 fL (ref 78.0–100.0)
Platelets: 276 10*3/uL (ref 150–400)
RBC: 4.36 MIL/uL (ref 4.22–5.81)
RDW: 16.9 % — ABNORMAL HIGH (ref 11.5–15.5)
WBC: 6 10*3/uL (ref 4.0–10.5)

## 2018-04-12 MED ORDER — TAMSULOSIN HCL 0.4 MG PO CAPS: 0.4000 mg | ORAL_CAPSULE | Freq: Every day | ORAL | 30 refills | Status: AC

## 2018-04-12 MED ORDER — SODIUM CHLORIDE 0.9 % IV BOLUS
1000.0000 mL | Freq: Once | INTRAVENOUS | Status: AC
  Administered 2018-04-12: 1000 mL via INTRAVENOUS

## 2018-04-12 MED ORDER — SODIUM CHLORIDE 0.9 % IV BOLUS: 500.0000 mL | Freq: Once | INTRAVENOUS | Status: DC

## 2018-04-12 NOTE — ED Notes (Signed)
Pt given [place] sandwich and apple juice per Joellen [place] [employer] banker, corporate)

## 2018-04-12 NOTE — ED Provider Notes (Signed)
[facility] EMERGENCY DEPARTMENT Provider Note   CSN: [id number] Arrival date & time: 04/12/18  0541     History   Chief Complaint Chief Complaint  Patient presents with  . Fall    HPI [name] is a 62 y.o. male.  HPI  62 yo male from [facility] living facility where he lives due to mental disability and multiple chronic health problems, who has a history of frequent falls, who fell today when gettingout of chair.  He denies loc, seizure, or weakness.  Abrasion to forehead.   Denies other injury, pain, or illness.  Uses cane for some ambulation.   Past Medical History:  Diagnosis Date  . Abscess of anal and rectal regions   . Crohn's disease ([place])   . DVT (deep venous thrombosis) ([place])   . Epilepsy ([place])    "petit mal; only at home" (6/9//2015)  . Heart murmur   . History of blood transfusion ~ 1992   "related to my Crohn's"  . History of stomach ulcers   . Hyperplastic colon polyp   . Iron deficiency anemia, unspecified   . Mental disability   . Other B-complex deficiencies   . REGIONAL ENTERITIS, LARGE INTESTINE 10/05/2000   Qualifier: Diagnosis of  By: Jerral Ralph    . S/P partial colectomy for Crohn's disease 12/01/2015  . SBO (small bowel obstruction) ([place]) 12/01/2015  . Syncope 03/31/2014  . Syncope and collapse 03/31/2014   "while driving"    Patient Active Problem List   Diagnosis Date Noted  . Mental disability   . S/P colostomy takedown 08/01/2016  . Crohn's disease of both small and large intestine with fistula ([place])   . Localization-related idiopathic epilepsy and epileptic syndromes with seizures of localized onset, not intractable, without status epilepticus ([place]) 03/30/2015  . Aortic valve stenosis, mild 03/31/2014  . Aortic valve regurgitation 03/31/2014  . Pseudopolyposis of colon ([place]) 01/03/2012  . Crohn's disease ([place]) 12/19/2011  . Personal history of immunosupression therapy 12/19/2011  . ECZEMA  08/21/2008  . CHOLELITHIASIS, ASYMPTOMATIC 01/29/2008  . VITAMIN B12 DEFICIENCY 01/17/2008    Past Surgical History:  Procedure Laterality Date  . COLON SURGERY     Anastomosis 45cm from anal verge c/w deswecending colon anastomosis  . COLONOSCOPY  2013, 2016  . COLOSTOMY TAKEDOWN N/A 08/01/2016   Procedure: LAPAROSCOPIC COLOSTOMY REVERSAL;  Surgeon: Alphonsa Overall, MD;  Location: [facility];  Service: General;  Laterality: N/A;  . [place]   ileocectomy   . HERNIA REPAIR    . LAPAROSCOPY N/A 12/04/2015   Procedure: LAPAROSCOPY DIAGNOSTIC, LAPAROSCOPIC INTEROLYSIS OF ADHESIONS, HAND- ASSISTED SMALL BOWEL RESECTION, MOBILIZATION OF SPLENIC FLEXURE, LEFT COLECTOMY WITH CREATION OF COLOSTOMY (HARTMAN'S PROCEDURE);  Surgeon: Alphonsa Overall, MD;  Location: [facility];  Service: General;  Laterality: N/A;  . PILONIDAL CYST EXCISION  1980's  . TONSILLECTOMY  ~ 1967  . UMBILICAL HERNIA REPAIR  ~ 1983        Home Medications    Prior to Admission medications   Medication Sig Start Date End Date Taking? Authorizing Provider  acetaminophen (TYLENOL) 325 MG tablet Take 650 mg by mouth every 6 (six) hours as needed for moderate pain.    [provider]  acetaminophen (TYLENOL) 500 MG tablet Take 1,000 mg by mouth at bedtime.    [provider]  Adalimumab (HUMIRA) 40 MG/0.4ML PSKT Inject 40 mg into the skin every 14 (fourteen) days. 11/16/17   Pyrtle, Lajuan Lines, MD  aspirin 81 MG chewable tablet Chew 81 mg by mouth daily.     [provider]  cyanocobalamin (,VITAMIN B-12,) 1000 MCG/ML injection Inject 1 mL (1,000 mcg total) into the muscle every 30 (thirty) days. 02/08/16   Pyrtle, Lajuan Lines, MD  diphenhydrAMINE (BENADRYL) 25 mg capsule Take 25 mg by mouth every 4 (four) hours as needed (for allergic reaction).    [provider]  furosemide (LASIX) 20 MG tablet Take 20 mg by mouth 2 (two) times daily.    [provider]  guaifenesin (ROBITUSSIN) 100 MG/5ML  syrup Take 200 mg by mouth every 6 (six) hours as needed for cough (Do not exceed 4 doses in 24 hours. If cough has not improved in 24 hours notify physician.).    [provider]  ipratropium (ATROVENT) 0.03 % nasal spray Place 1 spray into both nostrils 2 (two) times daily.  10/22/16   [provider]  levETIRAcetam (KEPPRA) 500 MG tablet Take 1 tablet (500 mg total) by mouth 2 (two) times daily. 12/05/17   Cameron Sprang, MD  loperamide (IMODIUM A-D) 2 MG tablet Take 2 mg by mouth 4 (four) times daily as needed for diarrhea or loose stools. With each loose stool up to 8 doses in 24 hours    [provider]  loratadine (CLARITIN) 10 MG tablet Take 10 mg by mouth daily.    [provider]  meclizine (ANTIVERT) 25 MG tablet Take 25 mg by mouth every 6 (six) hours as needed for dizziness.  11/07/16   [provider]  mesalamine (LIALDA) 1.2 g EC tablet Take 2 tablets (2.4 g total) by mouth daily with breakfast. 10/09/17   Pyrtle, Lajuan Lines, MD  Multiple Vitamins-Iron (MULTIVITAMINS WITH IRON) TABS tablet Take 1 tablet by mouth daily.    [provider]  nystatin (MYCOSTATIN) 100000 UNIT/ML suspension Take 10 mLs by mouth 4 (four) times daily.    [provider]  nystatin (NYSTATIN) powder Apply 1 Bottle topically 4 (four) times daily. Under his breast    [provider]  nystatin ointment (MYCOSTATIN) Apply 1 application topically 2 (two) times daily. To the perianal area 02/07/18   Pyrtle, Lajuan Lines, MD  ondansetron (ZOFRAN) 4 MG tablet Take 4 mg by mouth every 6 (six) hours as needed for nausea or vomiting.    [provider]  potassium chloride 20 MEQ/15ML (10%) SOLN Take 20 mEq by mouth daily.    [provider]  PRESCRIPTION MEDICATION Jobst Hose need size  Apply every morning and remove at bedtime For blood clot (Thigh High)    [provider]  primidone (MYSOLINE) 250 MG tablet Take 2 tablets (500 mg total)  by mouth 2 (two) times daily. 12/05/17   Cameron Sprang, MD  Triamcinolone Acetonide (TRIAMCINOLONE 0.1 % CREAM : EUCERIN) CREA Apply 1 application topically daily. On his face    [provider]  Vitamin D, Ergocalciferol, (DRISDOL) 50000 units CAPS capsule Take 50,000 Units by mouth every 7 (seven) days.    [provider]    Family History Family History  Problem Relation Age of Onset  . Skin cancer Father   . Colon cancer Mother   . Seizures Neg Hx   . Crohn's disease Neg Hx     Social History Social History   Tobacco Use  . Smoking status: Never Smoker  . Smokeless tobacco: Never Used  Substance Use Topics  . Alcohol use: No    Alcohol/week: 0.0  oz  . Drug use: No     Allergies   Penicillins   Review of Systems Review of Systems  All other systems reviewed and are negative.    Physical Exam Updated Vital Signs BP (!) 110/59   Pulse (!) 59   Temp (!) 96.7 F (35.9 C) (Rectal)   Resp 10   SpO2 100%   Physical Exam  Constitutional: He is oriented to person, place, and time. He appears well-developed and well-nourished.  HENT:  Right Ear: External ear normal.  Left Ear: External ear normal.  Nose: Nose normal.  Mouth/Throat: Oropharynx is clear and moist.  Abrasion right forehead  Eyes: Pupils are equal, round, and reactive to light. EOM are normal.  Neck: Normal range of motion.  Cardiovascular: Normal rate, regular rhythm and normal heart sounds.  Pulmonary/Chest: Effort normal and breath sounds normal.  Abdominal: Soft. Bowel sounds are normal.  Multiple healed scars  Musculoskeletal: Normal range of motion.  Contusions bilateral knees-  brownish  No ttp  Neurological: He is alert and oriented to person, place, and time. No cranial nerve deficit. Coordination normal.  Slowed movements  Skin: Skin is warm and dry.  Psychiatric: He has a normal mood and affect.  Nursing note and vitals reviewed.    ED Treatments / Results    Labs (all labs ordered are listed, but only abnormal results are displayed) Labs Reviewed  CBC - Abnormal; Notable for the following components:      Result Value   RDW 16.9 (*)    All other components within normal limits  COMPREHENSIVE METABOLIC PANEL - Abnormal; Notable for the following components:   Creatinine, Ser 1.35 (*)    Calcium 8.8 (*)    Alkaline Phosphatase 184 (*)    GFR calc non Af Amer 54 (*)    All other components within normal limits  URINALYSIS, ROUTINE W REFLEX MICROSCOPIC    EKG EKG Interpretation  Date/Time:  Friday April 12 2018 10:03:38 EDT Ventricular Rate:  57 PR Interval:    QRS Duration: 110 QT Interval:  445 QTC Calculation: 434 R Axis:   35 Text Interpretation:  Sinus rhythm Prolonged PR interval Confirmed by Pattricia Boss [phone]) on 04/12/2018 10:07:02 AM   Radiology Ct Head Wo Contrast  Result Date: 04/12/2018 CLINICAL DATA:  Tripped getting out of bed this morning striking forehead, history of seizure disorder, Crohn's disease EXAM: CT HEAD WITHOUT CONTRAST TECHNIQUE: Contiguous axial images were obtained from the base of the skull through the vertex without intravenous contrast. COMPARISON:  03/28/2018 FINDINGS: Brain: Normal ventricular morphology. Minimal generalized atrophy. No midline shift or mass effect. Otherwise normal appearance of brain parenchyma. No intracranial hemorrhage, mass lesion, or evidence of acute infarction. No extra-axial fluid collections. Dense calcification in anterior falx. Vascular: Unremarkable Skull: Intact intact Sinuses/Orbits: Visualized paranasal sinuses and RIGHT mastoid air cells clear. Persistent partial opacification of LEFT mastoid air cells. Material within LEFT external auditory canal question cerumen. Other: N/A IMPRESSION: No acute intracranial abnormalities. Persistent partial LEFT mastoid opacification. Electronically Signed   By: Lavonia Dana M.D.   On: 04/12/2018 08:45    Procedures BLADDER  CATHETERIZATION Date/Time: 04/12/2018 4:12 PM Performed by: Pattricia Boss, MD Authorized by: Pattricia Boss, MD   Consent:    Consent obtained:  Verbal   Consent given by:  Patient   Alternatives discussed:  No treatment Pre-procedure details:    Procedure purpose:  Therapeutic   Preparation: Patient was prepped and draped in usual sterile fashion  Anesthesia (see MAR for exact dosages):    Anesthesia method:  None Procedure details:    Provider performed due to:  Nurse unable to complete and complicated insertion   Catheter insertion:  Indwelling   Catheter type:  Coude   Catheter size:  20 Fr   Bladder irrigation: no     Number of attempts:  2   Urine characteristics:  Blood-tinged Post-procedure details:    Patient tolerance of procedure:  Tolerated well, no immediate complications   (including critical care time)  Medications Ordered in ED Medications - No data to display   Initial Impression / Assessment and Plan / ED Course  I have reviewed the triage vital signs and the nursing notes.  Pertinent labs & imaging results that were available during my care of the patient were reviewed by me and considered in my medical decision making (see chart for details).    Discussed care with poa,sister [name].   11 year old developmentally delayed man presents today after fall.  Historically he has had frequent falls.  He reports today that he lost his balance and does not report any loss of consciousness.  Evaluated here with a CT of head due to an abrasion of the head.  No other external injuries or pain are noted.  I discussed his care with his power of attorney.  She expressed concerns due to his Crohn's.  He had apparently been out of his medications for several days and had some diarrhea 2 days ago.  Patient states that he is not having abdominal pain.  He has had no loose bowel movements here and has taken p.o. without difficulty.  His abdomen is soft and nontender.He  has a mildly increased creatinine.  He is given one liter ns and po fluids.  I do not see any evidence for flare of Crohn's at this time.  Patient appears ambulatory at baseline.   which appears stable for him.  Plan return to his assisted living care facility.  12:47 PM Urine pending RN discussed with assisted living care facility.  I discussed with his power of attorney and she is aware of the Foley placement. Final Clinical Impressions(s) / ED Diagnoses   Final diagnoses:  Fall, initial encounter  Contusion of face, initial encounter  Urinary retention    ED Discharge Orders    None       Pattricia Boss, MD 04/12/18 1622

## 2018-04-12 NOTE — ED Notes (Signed)
Pt alert, with no family present VS documented

## 2018-04-12 NOTE — ED Triage Notes (Signed)
Patient BIB [facility] for fall this morning. Patient states he was getting out of bed when "all of the sudden lost his balance and fell over". Reports hitting his head but denies LOC. NOT on blood thinners. A&O x 3 at baseline. Patient denies any pain at this time, states "I just want to know why I keep falling". Reports falling multiple times within the past 3 months.

## 2018-04-12 NOTE — ED Notes (Signed)
Patient ambulated in hall with walker. Patient had steady gait however complained of feeling dizzy.

## 2018-04-12 NOTE — Discharge Instructions (Addendum)
Please encourage plenty of oral fluids. Recheck of creatinine with his doctor next week. Follow up with urology next week for recheck of foley catheter Please read catheter care instructions and empty when full

## 2018-04-13 LAB — URINE CULTURE: Culture: NO GROWTH

## 2018-04-19 ENCOUNTER — Emergency Department (HOSPITAL_COMMUNITY): Payer: Medicare Other

## 2018-04-19 ENCOUNTER — Encounter (HOSPITAL_COMMUNITY): Payer: Self-pay

## 2018-04-19 ENCOUNTER — Inpatient Hospital Stay (HOSPITAL_COMMUNITY)
Admission: EM | Admit: 2018-04-19 | Discharge: 2018-04-23 | DRG: 698 | Disposition: A | Discharge status: Skilled Nursing Facility | Payer: Medicare Other | Attending: Internal Medicine | Admitting: Internal Medicine

## 2018-04-19 ENCOUNTER — Other Ambulatory Visit: Payer: Self-pay

## 2018-04-19 DIAGNOSIS — G40409 Other generalized epilepsy and epileptic syndromes, not intractable, without status epilepticus: Secondary | ICD-10-CM | POA: Diagnosis present

## 2018-04-19 DIAGNOSIS — Z800 Family history of malignant neoplasm of digestive organs: Secondary | ICD-10-CM | POA: Diagnosis not present

## 2018-04-19 DIAGNOSIS — Z9049 Acquired absence of other specified parts of digestive tract: Secondary | ICD-10-CM

## 2018-04-19 DIAGNOSIS — R0602 Shortness of breath: Secondary | ICD-10-CM | POA: Diagnosis present

## 2018-04-19 DIAGNOSIS — Z7982 Long term (current) use of aspirin: Secondary | ICD-10-CM

## 2018-04-19 DIAGNOSIS — G40009 Localization-related (focal) (partial) idiopathic epilepsy and epileptic syndromes with seizures of localized onset, not intractable, without status epilepticus: Secondary | ICD-10-CM | POA: Diagnosis not present

## 2018-04-19 DIAGNOSIS — I5032 Chronic diastolic (congestive) heart failure: Secondary | ICD-10-CM | POA: Diagnosis present

## 2018-04-19 DIAGNOSIS — A419 Sepsis, unspecified organism: Secondary | ICD-10-CM | POA: Diagnosis present

## 2018-04-19 DIAGNOSIS — N401 Enlarged prostate with lower urinary tract symptoms: Secondary | ICD-10-CM | POA: Diagnosis present

## 2018-04-19 DIAGNOSIS — T83518A Infection and inflammatory reaction due to other urinary catheter, initial encounter: Secondary | ICD-10-CM | POA: Diagnosis not present

## 2018-04-19 DIAGNOSIS — N390 Urinary tract infection, site not specified: Secondary | ICD-10-CM | POA: Diagnosis not present

## 2018-04-19 DIAGNOSIS — F79 Unspecified intellectual disabilities: Secondary | ICD-10-CM

## 2018-04-19 DIAGNOSIS — N3001 Acute cystitis with hematuria: Secondary | ICD-10-CM | POA: Diagnosis not present

## 2018-04-19 DIAGNOSIS — K5090 Crohn's disease, unspecified, without complications: Secondary | ICD-10-CM | POA: Diagnosis present

## 2018-04-19 DIAGNOSIS — Z880 Allergy status to penicillin: Secondary | ICD-10-CM

## 2018-04-19 DIAGNOSIS — Z79899 Other long term (current) drug therapy: Secondary | ICD-10-CM

## 2018-04-19 DIAGNOSIS — T83511A Infection and inflammatory reaction due to indwelling urethral catheter, initial encounter: Secondary | ICD-10-CM

## 2018-04-19 DIAGNOSIS — Z86718 Personal history of other venous thrombosis and embolism: Secondary | ICD-10-CM | POA: Diagnosis not present

## 2018-04-19 DIAGNOSIS — Z9225 Personal history of immunosupression therapy: Secondary | ICD-10-CM | POA: Diagnosis not present

## 2018-04-19 DIAGNOSIS — R338 Other retention of urine: Secondary | ICD-10-CM | POA: Diagnosis present

## 2018-04-19 DIAGNOSIS — N400 Enlarged prostate without lower urinary tract symptoms: Secondary | ICD-10-CM | POA: Diagnosis present

## 2018-04-19 DIAGNOSIS — Z8711 Personal history of peptic ulcer disease: Secondary | ICD-10-CM

## 2018-04-19 DIAGNOSIS — Z9889 Other specified postprocedural states: Secondary | ICD-10-CM

## 2018-04-19 DIAGNOSIS — Z885 Allergy status to narcotic agent status: Secondary | ICD-10-CM | POA: Diagnosis not present

## 2018-04-19 DIAGNOSIS — K5080 Crohn's disease of both small and large intestine without complications: Secondary | ICD-10-CM | POA: Diagnosis not present

## 2018-04-19 DIAGNOSIS — K8020 Calculus of gallbladder without cholecystitis without obstruction: Secondary | ICD-10-CM | POA: Diagnosis not present

## 2018-04-19 DIAGNOSIS — E876 Hypokalemia: Secondary | ICD-10-CM | POA: Diagnosis present

## 2018-04-19 DIAGNOSIS — R0600 Dyspnea, unspecified: Secondary | ICD-10-CM | POA: Diagnosis not present

## 2018-04-19 LAB — COMPREHENSIVE METABOLIC PANEL
ALT: 22 U/L (ref 0–44)
AST: 21 U/L (ref 15–41)
Albumin: 3 g/dL — ABNORMAL LOW (ref 3.5–5.0)
Alkaline Phosphatase: 162 U/L — ABNORMAL HIGH (ref 38–126)
Anion gap: 9 (ref 5–15)
BILIRUBIN TOTAL: 1.6 mg/dL — AB (ref 0.3–1.2)
BUN: 18 mg/dL (ref 8–23)
CHLORIDE: 109 mmol/L (ref 98–111)
CO2: 23 mmol/L (ref 22–32)
CREATININE: 1.5 mg/dL — AB (ref 0.61–1.24)
Calcium: 8.7 mg/dL — ABNORMAL LOW (ref 8.9–10.3)
GFR calc non Af Amer: 48 mL/min — ABNORMAL LOW (ref >60)
GFR, EST AFRICAN AMERICAN: 55 mL/min — AB (ref >60)
Glucose, Bld: 128 mg/dL — ABNORMAL HIGH (ref 70–99)
POTASSIUM: 3.5 mmol/L (ref 3.5–5.1)
Sodium: 141 mmol/L (ref 135–145)
TOTAL PROTEIN: 7.7 g/dL (ref 6.5–8.1)

## 2018-04-19 LAB — CBC WITH DIFFERENTIAL/PLATELET
BASOS ABS: 0 10*3/uL (ref 0.0–0.1)
Basophils Relative: 0 %
EOS ABS: 0 10*3/uL (ref 0.0–0.7)
EOS PCT: 0 %
HCT: 37 % — ABNORMAL LOW (ref 39.0–52.0)
HEMOGLOBIN: 12 g/dL — AB (ref 13.0–17.0)
LYMPHS PCT: 8 %
Lymphs Abs: 1.2 10*3/uL (ref 0.7–4.0)
MCH: 30.8 pg (ref 26.0–34.0)
MCHC: 32.4 g/dL (ref 30.0–36.0)
MCV: 94.9 fL (ref 78.0–100.0)
Monocytes Absolute: 1.3 10*3/uL — ABNORMAL HIGH (ref 0.1–1.0)
Monocytes Relative: 8 %
NEUTROS PCT: 84 %
Neutro Abs: 13 10*3/uL — ABNORMAL HIGH (ref 1.7–7.7)
PLATELETS: 331 10*3/uL (ref 150–400)
RBC: 3.9 MIL/uL — AB (ref 4.22–5.81)
RDW: 17.1 % — ABNORMAL HIGH (ref 11.5–15.5)
WBC: 15.5 10*3/uL — AB (ref 4.0–10.5)

## 2018-04-19 LAB — URINALYSIS, ROUTINE W REFLEX MICROSCOPIC
Bilirubin Urine: NEGATIVE
Glucose, UA: NEGATIVE mg/dL
Ketones, ur: NEGATIVE mg/dL
Nitrite: POSITIVE — AB
PROTEIN: 30 mg/dL — AB
RBC / HPF: >50 RBC/hpf — ABNORMAL HIGH (ref 0–5)
SPECIFIC GRAVITY, URINE: 1.013 (ref 1.005–1.030)
WBC, UA: >50 WBC/hpf — ABNORMAL HIGH (ref 0–5)
pH: 5 (ref 5.0–8.0)

## 2018-04-19 LAB — I-STAT CG4 LACTIC ACID, ED
LACTIC ACID, VENOUS: 1.37 mmol/L (ref 0.5–1.9)
LACTIC ACID, VENOUS: 0.77 mmol/L (ref 0.5–1.9)

## 2018-04-19 MED ORDER — ONDANSETRON HCL 4 MG/2ML IJ SOLN: 4.0000 mg | Freq: Four times a day (QID) | INTRAMUSCULAR | Status: DC | PRN

## 2018-04-19 MED ORDER — IPRATROPIUM BROMIDE 0.03 % NA SOLN
1.0000 | Freq: Two times a day (BID) | NASAL | Status: DC
  Administered 2018-04-19 – 2018-04-23 (×8): 1 via NASAL
  Filled 2018-04-19: qty 30

## 2018-04-19 MED ORDER — PIPERACILLIN-TAZOBACTAM 3.375 G IVPB 30 MIN
3.3750 g | Freq: Once | INTRAVENOUS | Status: AC
  Administered 2018-04-19: 3.375 g via INTRAVENOUS
  Filled 2018-04-19: qty 50

## 2018-04-19 MED ORDER — TAB-A-VITE/IRON PO TABS
1.0000 | ORAL_TABLET | Freq: Every day | ORAL | Status: DC
  Administered 2018-04-20 – 2018-04-23 (×5): 1 via ORAL
  Filled 2018-04-19 (×5): qty 1

## 2018-04-19 MED ORDER — SODIUM CHLORIDE 0.9 % IV SOLN
INTRAVENOUS | Status: DC
  Administered 2018-04-19 – 2018-04-21 (×4): via INTRAVENOUS

## 2018-04-19 MED ORDER — VANCOMYCIN HCL 10 G IV SOLR
2000.0000 mg | Freq: Once | INTRAVENOUS | Status: AC
  Administered 2018-04-19: 2000 mg via INTRAVENOUS
  Filled 2018-04-19: qty 2000

## 2018-04-19 MED ORDER — LEVETIRACETAM 500 MG PO TABS
500.0000 mg | ORAL_TABLET | Freq: Two times a day (BID) | ORAL | Status: DC
  Administered 2018-04-19 – 2018-04-23 (×8): 500 mg via ORAL
  Filled 2018-04-19 (×8): qty 1

## 2018-04-19 MED ORDER — SODIUM CHLORIDE 0.9 % IV SOLN
2.0000 g | Freq: Two times a day (BID) | INTRAVENOUS | Status: DC
  Administered 2018-04-19 – 2018-04-22 (×7): 2 g via INTRAVENOUS
  Filled 2018-04-19 (×8): qty 2

## 2018-04-19 MED ORDER — LORATADINE 10 MG PO TABS
10.0000 mg | ORAL_TABLET | Freq: Every day | ORAL | Status: DC
  Administered 2018-04-20 – 2018-04-23 (×4): 10 mg via ORAL
  Filled 2018-04-19 (×4): qty 1

## 2018-04-19 MED ORDER — LEVOFLOXACIN IN D5W 750 MG/150ML IV SOLN: 750.0000 mg | Freq: Once | INTRAVENOUS | Status: DC

## 2018-04-19 MED ORDER — PRIMIDONE 250 MG PO TABS
500.0000 mg | ORAL_TABLET | Freq: Two times a day (BID) | ORAL | Status: DC
  Administered 2018-04-19 – 2018-04-23 (×8): 500 mg via ORAL
  Filled 2018-04-19 (×8): qty 2

## 2018-04-19 MED ORDER — OLOPATADINE HCL 0.1 % OP SOLN: 1.0000 [drp] | OPHTHALMIC | Status: DC | PRN

## 2018-04-19 MED ORDER — ONDANSETRON HCL 4 MG PO TABS: 4.0000 mg | ORAL_TABLET | Freq: Four times a day (QID) | ORAL | Status: DC | PRN

## 2018-04-19 MED ORDER — ACETAMINOPHEN 650 MG RE SUPP: 650.0000 mg | Freq: Four times a day (QID) | RECTAL | Status: DC | PRN

## 2018-04-19 MED ORDER — VANCOMYCIN HCL IN DEXTROSE 1-5 GM/200ML-% IV SOLN: 1000.0000 mg | Freq: Once | INTRAVENOUS | Status: DC

## 2018-04-19 MED ORDER — ACETAMINOPHEN 325 MG PO TABS: 650.0000 mg | ORAL_TABLET | Freq: Four times a day (QID) | ORAL | Status: DC | PRN

## 2018-04-19 MED ORDER — ASPIRIN 81 MG PO CHEW
81.0000 mg | CHEWABLE_TABLET | Freq: Every day | ORAL | Status: DC
  Administered 2018-04-20 – 2018-04-23 (×4): 81 mg via ORAL
  Filled 2018-04-19 (×4): qty 1

## 2018-04-19 MED ORDER — SODIUM CHLORIDE 0.9 % IV BOLUS
1000.0000 mL | Freq: Once | INTRAVENOUS | Status: AC
  Administered 2018-04-19: 1000 mL via INTRAVENOUS

## 2018-04-19 MED ORDER — ENOXAPARIN SODIUM 40 MG/0.4ML ~~LOC~~ SOLN
40.0000 mg | SUBCUTANEOUS | Status: DC
  Administered 2018-04-20 – 2018-04-23 (×4): 40 mg via SUBCUTANEOUS
  Filled 2018-04-19 (×4): qty 0.4

## 2018-04-19 MED ORDER — TAMSULOSIN HCL 0.4 MG PO CAPS
0.4000 mg | ORAL_CAPSULE | Freq: Every day | ORAL | Status: DC
  Administered 2018-04-20 – 2018-04-23 (×4): 0.4 mg via ORAL
  Filled 2018-04-19 (×4): qty 1

## 2018-04-19 MED ORDER — SODIUM CHLORIDE 0.9 % IV SOLN: 2.0000 g | Freq: Once | INTRAVENOUS | Status: DC

## 2018-04-19 MED ORDER — ACETAMINOPHEN 500 MG PO TABS
1000.0000 mg | ORAL_TABLET | Freq: Once | ORAL | Status: AC
  Administered 2018-04-19: 1000 mg via ORAL
  Filled 2018-04-19: qty 2

## 2018-04-19 MED ORDER — MESALAMINE 1.2 G PO TBEC
2.4000 g | DELAYED_RELEASE_TABLET | Freq: Every day | ORAL | Status: DC
  Administered 2018-04-20 – 2018-04-23 (×4): 2.4 g via ORAL
  Filled 2018-04-19 (×4): qty 2

## 2018-04-19 NOTE — Progress Notes (Signed)
A consult was received from an ED physician for Vancomycin and Zosyn per pharmacy dosing.  The patient's profile has been reviewed for ht/wt/allergies/indication/available labs.    Allergies  Allergen Reactions  . Darvon [Propoxyphene] Other (See Comments)    Unknown reaction    A one time order has been placed for Vancomycin 2g IV and Zosyn 3.375g.  Further antibiotics/pharmacy consults should be ordered by admitting physician if indicated.                       Thank you, Leeroy Bock 04/19/2018  5:09 PM

## 2018-04-19 NOTE — Progress Notes (Signed)
Pharmacy Antibiotic Note  [name] is a 62 y.o. male admitted from ALF on 04/19/2018 with UTI.  Recent foley catheter placement last week on 6/21 at [facility] ED and subsequently changed by urology as an outpatient.  Pharmacy has been consulted for Cefepime dosing.  Plan:  Cefepime 2g IV q12h.  Follow up renal fxn, culture results, and clinical course.  F/u ability to de-escalate antibiotics.   Weight: 230 lb (104.3 kg)  Temp (24hrs), Avg:101.3 F (38.5 C), Min:100.3 F (37.9 C), Max:102.2 F (39 C)  Recent Labs  Lab 04/19/18 1707 04/19/18 1724 04/19/18 2002  WBC 15.5*  --   --   CREATININE 1.50*  --   --   LATICACIDVEN  --  1.37 0.77    Estimated Creatinine Clearance: 61 mL/min (A) (by C-G formula based on SCr of 1.5 mg/dL (H)).    Allergies  Allergen Reactions  . Darvon [Propoxyphene] Other (See Comments)    Unknown reaction    Antimicrobials this admission: 6/28 Vanc x1 dose 6/28 Zosyn x1 dose 6/28 Cefepime >>   Dose adjustments this admission:   Microbiology results: 6/28 BCx:  6/28 UCx:   Thank you for allowing pharmacy to be a part of this patient's care.  Gretta Arab PharmD, BCPS Pager [phone] 04/19/2018 8:41 PM

## 2018-04-19 NOTE — ED Triage Notes (Signed)
Pt arrives via EMS  from [facility] for fever. EMS reports that fever was 103 oral at facility. Pt had foley placed last week for enlarged prostate.

## 2018-04-19 NOTE — ED Provider Notes (Signed)
[facility] DEPT Provider Note   CSN: [id number] Arrival date & time: 04/19/18  1604     History   Chief Complaint Chief Complaint  Patient presents with  . Fever    HPI [name] is a 62 y.o. male with a hx of Crohn's disease s/p partial colectomy, immunosuppressed on humira, DVT, epilepsy, and mental disability who presents to the ED from nursing care facility for fever today. Per patient he has felt okay without significant complaints. His cousin at bedside states he has seemed a bit dehydrated, maybe a bit out of it but no more than baseline. He has  had some falls recently, last week he was unable to urinate, bladder scan performed a subsequent foley catheter placed. He followed up with urology, catheter was changed out at that time with new catheter placed, thought to be secondary to BPH, catheter remains in place .  He has been in a wheelchair since his most recent fall. no other major complaints. No alleviating/aggravating factors. Denies nausea, vomiting, diarrhea, abdominal pain, cough, or dyspnea.   HPI  Past Medical History:  Diagnosis Date  . Abscess of anal and rectal regions   . Crohn's disease ([place])   . DVT (deep venous thrombosis) ([place])   . Epilepsy ([place])    "petit mal; only at home" (6/9//2015)  . Heart murmur   . History of blood transfusion ~ 1992   "related to my Crohn's"  . History of stomach ulcers   . Hyperplastic colon polyp   . Iron deficiency anemia, unspecified   . Mental disability   . Other B-complex deficiencies   . REGIONAL ENTERITIS, LARGE INTESTINE 10/05/2000   Qualifier: Diagnosis of  By: Jerral Ralph    . S/P partial colectomy for Crohn's disease 12/01/2015  . SBO (small bowel obstruction) ([place]) 12/01/2015  . Syncope 03/31/2014  . Syncope and collapse 03/31/2014   "while driving"    Patient Active Problem List   Diagnosis Date Noted  . Mental disability   . S/P colostomy takedown 08/01/2016  .  Crohn's disease of both small and large intestine with fistula ([place])   . Localization-related idiopathic epilepsy and epileptic syndromes with seizures of localized onset, not intractable, without status epilepticus ([place]) 03/30/2015  . Aortic valve stenosis, mild 03/31/2014  . Aortic valve regurgitation 03/31/2014  . Pseudopolyposis of colon ([place]) 01/03/2012  . Crohn's disease ([place]) 12/19/2011  . Personal history of immunosupression therapy 12/19/2011  . ECZEMA 08/21/2008  . CHOLELITHIASIS, ASYMPTOMATIC 01/29/2008  . VITAMIN B12 DEFICIENCY 01/17/2008    Past Surgical History:  Procedure Laterality Date  . COLON SURGERY     Anastomosis 45cm from anal verge c/w deswecending colon anastomosis  . COLONOSCOPY  2013, 2016  . COLOSTOMY TAKEDOWN N/A 08/01/2016   Procedure: LAPAROSCOPIC COLOSTOMY REVERSAL;  Surgeon: Alphonsa Overall, MD;  Location: [facility];  Service: General;  Laterality: N/A;  . [place]   ileocectomy   . HERNIA REPAIR    . LAPAROSCOPY N/A 12/04/2015   Procedure: LAPAROSCOPY DIAGNOSTIC, LAPAROSCOPIC INTEROLYSIS OF ADHESIONS, HAND- ASSISTED SMALL BOWEL RESECTION, MOBILIZATION OF SPLENIC FLEXURE, LEFT COLECTOMY WITH CREATION OF COLOSTOMY (HARTMAN'S PROCEDURE);  Surgeon: Alphonsa Overall, MD;  Location: [facility];  Service: General;  Laterality: N/A;  . PILONIDAL CYST EXCISION  1980's  . TONSILLECTOMY  ~ 1967  . UMBILICAL HERNIA REPAIR  ~ 1983        Home Medications    Prior to Admission medications   Medication Sig Start  Date End Date Taking? Authorizing Provider  acetaminophen (TYLENOL) 325 MG tablet Take 325 mg by mouth every 6 (six) hours as needed for moderate pain.     [provider]  acetaminophen (TYLENOL) 500 MG tablet Take 1,000 mg by mouth at bedtime.    [provider]  Adalimumab (HUMIRA) 40 MG/0.4ML PSKT Inject 40 mg into the skin every 14 (fourteen) days. 11/16/17   Pyrtle, Lajuan Lines, MD  aspirin 81 MG chewable tablet Chew 81 mg by mouth daily.      [provider]  cyanocobalamin (,VITAMIN B-12,) 1000 MCG/ML injection Inject 1 mL (1,000 mcg total) into the muscle every 30 (thirty) days. 02/08/16   Pyrtle, Lajuan Lines, MD  diphenhydrAMINE (BENADRYL) 25 mg capsule Take 25 mg by mouth every 4 (four) hours as needed (for allergic reaction).    [provider]  furosemide (LASIX) 20 MG tablet Take 20 mg by mouth 2 (two) times daily.    [provider]  guaifenesin (ROBITUSSIN) 100 MG/5ML syrup Take 200 mg by mouth every 6 (six) hours as needed for cough (Do not exceed 4 doses in 24 hours. If cough has not improved in 24 hours notify physician.).    [provider]  ipratropium (ATROVENT) 0.03 % nasal spray Place 1 spray into both nostrils 2 (two) times daily.  10/22/16   [provider]  levETIRAcetam (KEPPRA) 500 MG tablet Take 1 tablet (500 mg total) by mouth 2 (two) times daily. 12/05/17   Cameron Sprang, MD  loperamide (IMODIUM A-D) 2 MG tablet Take 2 mg by mouth 4 (four) times daily as needed for diarrhea or loose stools. With each loose stool up to 8 doses in 24 hours    [provider]  loratadine (CLARITIN) 10 MG tablet Take 10 mg by mouth daily.    [provider]  meclizine (ANTIVERT) 25 MG tablet Take 25 mg by mouth every 6 (six) hours as needed for dizziness.  11/07/16   [provider]  mesalamine (LIALDA) 1.2 g EC tablet Take 2 tablets (2.4 g total) by mouth daily with breakfast. 10/09/17   Pyrtle, Lajuan Lines, MD  Multiple Vitamins-Iron (MULTIVITAMINS WITH IRON) TABS tablet Take 1 tablet by mouth daily.    [provider]  nystatin ointment (MYCOSTATIN) Apply 1 application topically 2 (two) times daily. To the perianal area Patient not taking: Reported on 04/12/2018 02/07/18   Pyrtle, Lajuan Lines, MD  ondansetron (ZOFRAN) 4 MG tablet Take 4 mg by mouth every 6 (six) hours as needed for nausea or vomiting.    [provider]  potassium chloride 20 MEQ/15ML (10%)  SOLN Take 20 mEq by mouth daily. Dilute with 4oz of water    [provider]  PRESCRIPTION MEDICATION Jobst Hose need size  Apply every morning and remove at bedtime For blood clot (Thigh High)    [provider]  primidone (MYSOLINE) 250 MG tablet Take 2 tablets (500 mg total) by mouth 2 (two) times daily. 12/05/17   Cameron Sprang, MD  tamsulosin (FLOMAX) 0.4 MG CAPS capsule Take 1 capsule (0.4 mg total) by mouth daily. 04/12/18   Pattricia Boss, MD  Vitamin D, Ergocalciferol, (DRISDOL) 50000 units CAPS capsule Take 50,000 Units by mouth every 7 (seven) days.    [provider]    Family History Family History  Problem Relation Age of Onset  . Skin cancer Father   . Colon cancer Mother   . Seizures Neg Hx   .  Crohn's disease Neg Hx     Social History Social History   Tobacco Use  . Smoking status: Never Smoker  . Smokeless tobacco: Never Used  Substance Use Topics  . Alcohol use: No    Alcohol/week: 0.0 oz  . Drug use: No     Allergies   Penicillins   Review of Systems Review of Systems  Constitutional: Positive for fever.  Respiratory: Negative for cough and shortness of breath.   Cardiovascular: Negative for chest pain.  Gastrointestinal: Negative for abdominal pain, blood in stool, diarrhea, nausea and vomiting.  Genitourinary: Negative for flank pain.       Foley catheter in place  Skin: Negative for wound.  Neurological: Negative for headaches.  All other systems reviewed and are negative.   Physical Exam Updated Vital Signs BP (!) 99/36 (BP Location: Right Arm)   Pulse 99   Temp (!) 102.2 F (39 C) (Rectal)   Resp 20   Wt 104.3 kg (230 lb)   SpO2 98%   BMI 33.00 kg/m   Physical Exam  Constitutional: He is oriented to person, place, and time. He appears well-developed and well-nourished.  Non-toxic appearance. No distress.  HENT:  Head: Normocephalic and atraumatic.  Right Ear: Tympanic membrane normal.  Left Ear:  Tympanic membrane normal.  Nose: Nose normal.  Mouth/Throat: Uvula is midline. Mucous membranes are dry.  Eyes: Pupils are equal, round, and reactive to light. Conjunctivae and EOM are normal. Right eye exhibits no discharge. Left eye exhibits no discharge.  Neck: No neck rigidity.  Cardiovascular: Normal rate and regular rhythm.  Murmur heard. Pulmonary/Chest: Effort normal and breath sounds normal. No respiratory distress. He has no wheezes. He has no rhonchi. He has no rales.  Abdominal: Soft. He exhibits no distension. There is no tenderness. There is no rigidity, no rebound and no guarding.  Well healed surgical scar.   Genitourinary:  Genitourinary Comments: Foley catheter in place. Urine appears somewhat dark. RN present as Producer, television/film/video.   Neurological: He is alert and oriented to person, place, and time.  Clear speech.   Skin: Skin is warm and dry. No rash noted.  Psychiatric: He has a normal mood and affect. His behavior is normal.  Nursing note and vitals reviewed.   ED Treatments / Results  Labs Results for orders placed or performed during the hospital encounter of 04/19/18  Comprehensive metabolic panel  Result Value Ref Range   Sodium 141 135 - 145 mmol/L   Potassium 3.5 3.5 - 5.1 mmol/L   Chloride 109 98 - 111 mmol/L   CO2 23 22 - 32 mmol/L   Glucose, Bld 128 (H) 70 - 99 mg/dL   BUN 18 8 - 23 mg/dL   Creatinine, Ser 1.50 (H) 0.61 - 1.24 mg/dL   Calcium 8.7 (L) 8.9 - 10.3 mg/dL   Total Protein 7.7 6.5 - 8.1 g/dL   Albumin 3.0 (L) 3.5 - 5.0 g/dL   AST 21 15 - 41 U/L   ALT 22 0 - 44 U/L   Alkaline Phosphatase 162 (H) 38 - 126 U/L   Total Bilirubin 1.6 (H) 0.3 - 1.2 mg/dL   GFR calc non Af Amer 48 (L) >60 mL/min   GFR calc Af Amer 55 (L) >60 mL/min   Anion gap 9 5 - 15  CBC WITH DIFFERENTIAL  Result Value Ref Range   WBC 15.5 (H) 4.0 - 10.5 K/uL   RBC 3.90 (L) 4.22 - 5.81 MIL/uL   Hemoglobin 12.0 (L)  13.0 - 17.0 g/dL   HCT 37.0 (L) 39.0 - 52.0 %   MCV 94.9 78.0  - 100.0 fL   MCH 30.8 26.0 - 34.0 pg   MCHC 32.4 30.0 - 36.0 g/dL   RDW 17.1 (H) 11.5 - 15.5 %   Platelets 331 150 - 400 K/uL   Neutrophils Relative % 84 %   Neutro Abs 13.0 (H) 1.7 - 7.7 K/uL   Lymphocytes Relative 8 %   Lymphs Abs 1.2 0.7 - 4.0 K/uL   Monocytes Relative 8 %   Monocytes Absolute 1.3 (H) 0.1 - 1.0 K/uL   Eosinophils Relative 0 %   Eosinophils Absolute 0.0 0.0 - 0.7 K/uL   Basophils Relative 0 %   Basophils Absolute 0.0 0.0 - 0.1 K/uL  Urinalysis, Routine w reflex microscopic  Result Value Ref Range   Color, Urine AMBER (A) YELLOW   APPearance CLOUDY (A) CLEAR   Specific Gravity, Urine 1.013 1.005 - 1.030   pH 5.0 5.0 - 8.0   Glucose, UA NEGATIVE NEGATIVE mg/dL   Hgb urine dipstick LARGE (A) NEGATIVE   Bilirubin Urine NEGATIVE NEGATIVE   Ketones, ur NEGATIVE NEGATIVE mg/dL   Protein, ur 30 (A) NEGATIVE mg/dL   Nitrite POSITIVE (A) NEGATIVE   Leukocytes, UA LARGE (A) NEGATIVE   RBC / HPF >50 (H) 0 - 5 RBC/hpf   WBC, UA >50 (H) 0 - 5 WBC/hpf   Bacteria, UA MANY (A) NONE SEEN   WBC Clumps PRESENT    Mucus PRESENT    Hyaline Casts, UA PRESENT    Granular Casts, UA PRESENT   I-Stat CG4 Lactic Acid, ED  (not at  [facility])  Result Value Ref Range   Lactic Acid, Venous 1.37 0.5 - 1.9 mmol/L    EKG None  Radiology Dg Chest 1 View  Result Date: 04/19/2018 CLINICAL DATA:  Dry cough for 2-3 days. EXAM: CHEST  1 VIEW COMPARISON:  01/17/2016 FINDINGS: Stable mild cardiomegaly. Slight uncoiling of the thoracic aorta without aneurysm. The lungs are free of pulmonary consolidations. No pulmonary edema.No acute osseous abnormality. IMPRESSION: No active disease.  Stable mild cardiomegaly. Electronically Signed   By: Ashley Royalty M.D.   On: 04/19/2018 18:11    Procedures Procedures (including critical care time)  CRITICAL CARE Performed by: Kennith Maes   Total critical care time: 35 minutes  Critical care time was exclusive of separately billable procedures  and treating other patients.  Critical care was necessary to treat or prevent imminent or life-threatening deterioration.  Critical care was time spent personally by me on the following activities: development of treatment plan with patient and/or surrogate as well as nursing, discussions with consultants, evaluation of patient's response to treatment, examination of patient, obtaining history from patient or surrogate, ordering and performing treatments and interventions, ordering and review of laboratory studies, ordering and review of radiographic studies, pulse oximetry and re-evaluation of patient's condition.  Medications Ordered in ED Medications - No data to display   Initial Impression / Assessment and Plan / ED Course  I have reviewed the triage vital signs and the nursing notes.  Pertinent labs & imaging results that were available during my care of the patient were reviewed by me and considered in my medical decision making (see chart for details).    Patient presents for evaluation of fever.  Patient nontoxic-appearing, notably febrile at 102.2 and hypotensive at 99/36 upon arrival.  Code sepsis initiated with broad-spectrum antibiotics.  Most suspicious for UTI at this  time given recent Foley catheter insertion.  Will give 1 L of fluids and reassess, hold off on weight based fluid bolus at this time. Tylenol for fever.   UA consistent with UTI, culture ordered. Patient with leukocytosis at 15.5. Hgb appears baseline at 12.0 when compared to ranges in the past. Creatinine 1.5, increased from previous 1.35.Lactic acid WNL. Blood pressures remain soft, but improved, will give additional liter of fluids and recheck lactic acid. CXR negative for infiltrate. Repeat lactic WNL. Patient with fever, leukocytosis, and UTI meeting sepsis criteria. Discussed findings with supervising physician Dr. Alvino Chapel, in agreement with plan for admission.   20:21: CONSULT: Discussed case with hospitalist  Dr. Alcario Drought who accepts admission.   Final Clinical Impressions(s) / ED Diagnoses   Final diagnoses:  Acute cystitis with hematuria    ED Discharge Orders    None       Leafy Kindle 04/19/18 2042    Davonna Belling, MD 04/19/18 2348

## 2018-04-19 NOTE — H&P (Addendum)
History and Physical    [name]:[id number] DOB: [birth year] DOA: 04/19/2018  PCP: Jonathan Number, MD  Patient coming from: [facility]  I have personally briefly reviewed patient's old medical records in [place]  Chief Complaint: Fever  HPI: [name] is a 62 y.o. male with medical history significant of MR, Crohn's on Humira.  Patient presents to the ED with fever of 103 at facility.  Patient recently had fall at SNF, urinary retention detected in ED during work up 6/21.  Foley placed in ED for BPH induced urinary retention.  Foley changed out at urologists office during follow up since initial placement a week ago.  Today patient developed fever at SNF.  Sent in to ED.   ED Course: Fever 102.2.  WBC 15k.  BP initially 90s, now 110s during IVF bolus.  Lactate nl x2.  UA confirms UTI.  Initially given zosyn and vanc.   Review of Systems: As per HPI otherwise 10 point review of systems negative.   Past Medical History:  Diagnosis Date  . Abscess of anal and rectal regions   . Crohn's disease ([place])   . DVT (deep venous thrombosis) ([place])   . Epilepsy ([place])    "petit mal; only at home" (6/9//2015)  . Heart murmur   . History of blood transfusion ~ 1992   "related to my Crohn's"  . History of stomach ulcers   . Hyperplastic colon polyp   . Iron deficiency anemia, unspecified   . Mental disability   . Other B-complex deficiencies   . REGIONAL ENTERITIS, LARGE INTESTINE 10/05/2000   Qualifier: Diagnosis of  By: Jerral Ralph    . S/P partial colectomy for Crohn's disease 12/01/2015  . SBO (small bowel obstruction) ([place]) 12/01/2015  . Syncope 03/31/2014  . Syncope and collapse 03/31/2014   "while driving"    Past Surgical History:  Procedure Laterality Date  . COLON SURGERY     Anastomosis 45cm from anal verge c/w deswecending colon anastomosis  . COLONOSCOPY  2013, 2016  . COLOSTOMY TAKEDOWN N/A 08/01/2016   Procedure: LAPAROSCOPIC COLOSTOMY  REVERSAL;  Surgeon: Alphonsa Overall, MD;  Location: [facility];  Service: General;  Laterality: N/A;  . [place]   ileocectomy   . HERNIA REPAIR    . LAPAROSCOPY N/A 12/04/2015   Procedure: LAPAROSCOPY DIAGNOSTIC, LAPAROSCOPIC INTEROLYSIS OF ADHESIONS, HAND- ASSISTED SMALL BOWEL RESECTION, MOBILIZATION OF SPLENIC FLEXURE, LEFT COLECTOMY WITH CREATION OF COLOSTOMY (HARTMAN'S PROCEDURE);  Surgeon: Alphonsa Overall, MD;  Location: [facility];  Service: General;  Laterality: N/A;  . PILONIDAL CYST EXCISION  1980's  . TONSILLECTOMY  ~ 1967  . UMBILICAL HERNIA REPAIR  ~ 1983     reports that he has never smoked. He has never used smokeless tobacco. He reports that he does not drink alcohol or use drugs.  Allergies  Allergen Reactions  . Darvon [Propoxyphene] Other (See Comments)    Unknown reaction    Family History  Problem Relation Age of Onset  . Skin cancer Father   . Colon cancer Mother   . Seizures Neg Hx   . Crohn's disease Neg Hx      Prior to Admission medications   Medication Sig Start Date End Date Taking? Authorizing Provider  acetaminophen (TYLENOL) 325 MG tablet Take 325 mg by mouth every 6 (six) hours as needed for moderate pain.    Yes [provider]  acetaminophen (TYLENOL) 500 MG tablet Take 500 mg by mouth at  bedtime.    Yes [provider]  Adalimumab (HUMIRA) 40 MG/0.4ML PSKT Inject 40 mg into the skin every 14 (fourteen) days. 11/16/17  Yes Pyrtle, Lajuan Lines, MD  aspirin 81 MG chewable tablet Chew 81 mg by mouth daily.    Yes [provider]  cyanocobalamin (,VITAMIN B-12,) 1000 MCG/ML injection Inject 1 mL (1,000 mcg total) into the muscle every 30 (thirty) days. 02/08/16  Yes Pyrtle, Lajuan Lines, MD  diphenhydrAMINE (BENADRYL) 25 mg capsule Take 25 mg by mouth every 4 (four) hours as needed (for allergic reaction).   Yes [provider]  furosemide (LASIX) 20 MG tablet Take 20 mg by mouth 2 (two) times daily.   Yes [provider]    guaifenesin (ROBITUSSIN) 100 MG/5ML syrup Take 200 mg by mouth every 6 (six) hours as needed for cough (Do not exceed 4 doses in 24 hours. If cough has not improved in 24 hours notify physician.).   Yes [provider]  ipratropium (ATROVENT) 0.03 % nasal spray Place 1 spray into both nostrils 2 (two) times daily.  10/22/16  Yes [provider]  levETIRAcetam (KEPPRA) 500 MG tablet Take 1 tablet (500 mg total) by mouth 2 (two) times daily. 12/05/17  Yes Cameron Sprang, MD  loratadine (CLARITIN) 10 MG tablet Take 10 mg by mouth daily.   Yes [provider]  meclizine (ANTIVERT) 25 MG tablet Take 25 mg by mouth every 6 (six) hours as needed for dizziness.  11/07/16  Yes [provider]  mesalamine (LIALDA) 1.2 g EC tablet Take 2 tablets (2.4 g total) by mouth daily with breakfast. 10/09/17  Yes Pyrtle, Lajuan Lines, MD  Multiple Vitamins-Iron (MULTIVITAMINS WITH IRON) TABS tablet Take 1 tablet by mouth daily.   Yes [provider]  ondansetron (ZOFRAN) 4 MG tablet Take 4 mg by mouth every 6 (six) hours as needed for nausea or vomiting.   Yes [provider]  PAZEO 0.7 % SOLN Place 1 drop into both eyes as needed (irritation).  04/17/18  Yes [provider]  potassium chloride 20 MEQ/15ML (10%) SOLN Take 20 mEq by mouth daily. Dilute with 4oz of water   Yes [provider]  PRESCRIPTION MEDICATION TED hose Apply every morning and remove at bedtime as tolerated For blood clot (Thigh High)   Yes [provider]  primidone (MYSOLINE) 250 MG tablet Take 2 tablets (500 mg total) by mouth 2 (two) times daily. 12/05/17  Yes Cameron Sprang, MD  tamsulosin (FLOMAX) 0.4 MG CAPS capsule Take 1 capsule (0.4 mg total) by mouth daily. 04/12/18  Yes Pattricia Boss, MD  Vitamin D, Ergocalciferol, (DRISDOL) 50000 units CAPS capsule Take 50,000 Units by mouth every 7 (seven) days. On Tuesday   Yes [provider]  zinc oxide (BALMEX) 11.3 %  CREA cream Apply 1 application topically 2 (two) times daily as needed (irritation). Apply to rectum   Yes [provider]    Physical Exam: Vitals:   04/19/18 1900 04/19/18 1920 04/19/18 1950 04/19/18 2042  BP: (!) 108/49 (!) 116/47 (!) 108/50   Pulse: 81 80 80   Resp:  20 (!) 22   Temp:    99.6 F (37.6 C)  TempSrc:    Rectal  SpO2: 98% 96% 97%   Weight:        Constitutional: NAD, calm, comfortable Eyes: PERRL, lids and conjunctivae normal ENMT: Mucous membranes are moist. Posterior pharynx clear of any exudate or lesions.Normal dentition.  Neck:  normal, supple, no masses, no thyromegaly Respiratory: clear to auscultation bilaterally, no wheezing, no crackles. Normal respiratory effort. No accessory muscle use.  Cardiovascular: Regular rate and rhythm, no murmurs / rubs / gallops. No extremity edema. 2+ pedal pulses. No carotid bruits.  Abdomen: no tenderness, no masses palpated. No hepatosplenomegaly. Bowel sounds positive.  Musculoskeletal: no clubbing / cyanosis. No joint deformity upper and lower extremities. Good ROM, no contractures. Normal muscle tone.  Skin: no rashes, lesions, ulcers. No induration Neurologic: CN 2-12 grossly intact. Sensation intact, DTR normal. Strength 5/5 in all 4.  Psychiatric: Normal judgment and insight. Alert and oriented x 3. Normal mood.    Labs on Admission: I have personally reviewed following labs and imaging studies  CBC: Recent Labs  Lab 04/19/18 1707  WBC 15.5*  NEUTROABS 13.0*  HGB 12.0*  HCT 37.0*  MCV 94.9  PLT 355   Basic Metabolic Panel: Recent Labs  Lab 04/19/18 1707  NA 141  K 3.5  CL 109  CO2 23  GLUCOSE 128*  BUN 18  CREATININE 1.50*  CALCIUM 8.7*   GFR: Estimated Creatinine Clearance: 61 mL/min (A) (by C-G formula based on SCr of 1.5 mg/dL (H)). Liver Function Tests: Recent Labs  Lab 04/19/18 1707  AST 21  ALT 22  ALKPHOS 162*  BILITOT 1.6*  PROT 7.7  ALBUMIN 3.0*   No results for  input(s): LIPASE, AMYLASE in the last 168 hours. No results for input(s): AMMONIA in the last 168 hours. Coagulation Profile: No results for input(s): INR, PROTIME in the last 168 hours. Cardiac Enzymes: No results for input(s): CKTOTAL, CKMB, CKMBINDEX, TROPONINI in the last 168 hours. BNP (last 3 results) No results for input(s): PROBNP in the last 8760 hours. HbA1C: No results for input(s): HGBA1C in the last 72 hours. CBG: No results for input(s): GLUCAP in the last 168 hours. Lipid Profile: No results for input(s): CHOL, HDL, LDLCALC, TRIG, CHOLHDL, LDLDIRECT in the last 72 hours. Thyroid Function Tests: No results for input(s): TSH, T4TOTAL, FREET4, T3FREE, THYROIDAB in the last 72 hours. Anemia Panel: No results for input(s): VITAMINB12, FOLATE, FERRITIN, TIBC, IRON, RETICCTPCT in the last 72 hours. Urine analysis:    Component Value Date/Time   COLORURINE AMBER (A) 04/19/2018 1707   APPEARANCEUR CLOUDY (A) 04/19/2018 1707   LABSPEC 1.013 04/19/2018 1707   PHURINE 5.0 04/19/2018 1707   GLUCOSEU NEGATIVE 04/19/2018 1707   HGBUR LARGE (A) 04/19/2018 1707   BILIRUBINUR NEGATIVE 04/19/2018 1707   KETONESUR NEGATIVE 04/19/2018 1707   PROTEINUR 30 (A) 04/19/2018 1707   UROBILINOGEN 1.0 04/01/2014 1057   NITRITE POSITIVE (A) 04/19/2018 1707   LEUKOCYTESUR LARGE (A) 04/19/2018 1707    Radiological Exams on Admission: Dg Chest 1 View  Result Date: 04/19/2018 CLINICAL DATA:  Dry cough for 2-3 days. EXAM: CHEST  1 VIEW COMPARISON:  01/17/2016 FINDINGS: Stable mild cardiomegaly. Slight uncoiling of the thoracic aorta without aneurysm. The lungs are free of pulmonary consolidations. No pulmonary edema.No acute osseous abnormality. IMPRESSION: No active disease.  Stable mild cardiomegaly. Electronically Signed   By: Ashley Royalty M.D.   On: 04/19/2018 18:11    EKG: Independently reviewed.  Assessment/Plan Principal Problem:   Catheter-associated urinary tract infection  (HCC) Active Problems:   Crohn's disease (HCC)   Personal history of immunosupression therapy   Localization-related idiopathic epilepsy and epileptic syndromes with seizures of localized onset, not intractable, without status epilepticus ([place])   Sepsis ([place])   Mental disability    1. CAUTI -  causing sepsis 1. Cefepime per pharm 2. UCx, BCx pending 3. IVF: NS at 125 cc/hr, 2L bolus 4. Tele monitor 5. Tylenol PRN fever 2. Crohn's disease - 1. Continue mesalamine 2. Humira last dose was 6/20, not due for another week, hold if still has infection at that time though. 3. H/o epilepsy - continue keppra 4. MR - chronic and baseline  DVT prophylaxis: Lovenox Code Status: Full Family Communication: Family at bedside Disposition Plan: SNF after admit Consults called: None Admission status: Admit to inpatient   Etta Quill DO [facility] Pager [phone] Only works nights!  If 7AM-7PM, please contact the primary day team physician taking care of patient  [email or web address] Password [facility]  04/19/2018, 8:43 PM

## 2018-04-19 NOTE — ED Notes (Signed)
Pt has some difficulty swallowing. Medication was crushed and put in applesauce. Pt has hx of this per family

## 2018-04-20 ENCOUNTER — Other Ambulatory Visit: Payer: Self-pay

## 2018-04-20 DIAGNOSIS — A419 Sepsis, unspecified organism: Secondary | ICD-10-CM

## 2018-04-20 DIAGNOSIS — G40009 Localization-related (focal) (partial) idiopathic epilepsy and epileptic syndromes with seizures of localized onset, not intractable, without status epilepticus: Secondary | ICD-10-CM

## 2018-04-20 DIAGNOSIS — F79 Unspecified intellectual disabilities: Secondary | ICD-10-CM

## 2018-04-20 DIAGNOSIS — I5032 Chronic diastolic (congestive) heart failure: Secondary | ICD-10-CM

## 2018-04-20 DIAGNOSIS — K5080 Crohn's disease of both small and large intestine without complications: Secondary | ICD-10-CM

## 2018-04-20 DIAGNOSIS — N3001 Acute cystitis with hematuria: Secondary | ICD-10-CM

## 2018-04-20 DIAGNOSIS — N390 Urinary tract infection, site not specified: Secondary | ICD-10-CM

## 2018-04-20 DIAGNOSIS — T83511A Infection and inflammatory reaction due to indwelling urethral catheter, initial encounter: Secondary | ICD-10-CM

## 2018-04-20 DIAGNOSIS — Z9225 Personal history of immunosupression therapy: Secondary | ICD-10-CM

## 2018-04-20 LAB — BASIC METABOLIC PANEL
Anion gap: 8 (ref 5–15)
BUN: 15 mg/dL (ref 8–23)
CO2: 19 mmol/L — ABNORMAL LOW (ref 22–32)
Calcium: 8 mg/dL — ABNORMAL LOW (ref 8.9–10.3)
Chloride: 113 mmol/L — ABNORMAL HIGH (ref 98–111)
Creatinine, Ser: 1.21 mg/dL (ref 0.61–1.24)
Glucose, Bld: 95 mg/dL (ref 70–99)
POTASSIUM: 3.4 mmol/L — AB (ref 3.5–5.1)
SODIUM: 140 mmol/L (ref 135–145)

## 2018-04-20 LAB — CBC
HCT: 33.1 % — ABNORMAL LOW (ref 39.0–52.0)
Hemoglobin: 10.7 g/dL — ABNORMAL LOW (ref 13.0–17.0)
MCH: 30.7 pg (ref 26.0–34.0)
MCHC: 32.3 g/dL (ref 30.0–36.0)
MCV: 95.1 fL (ref 78.0–100.0)
PLATELETS: 271 10*3/uL (ref 150–400)
RBC: 3.48 MIL/uL — AB (ref 4.22–5.81)
RDW: 17.3 % — ABNORMAL HIGH (ref 11.5–15.5)
WBC: 12.4 10*3/uL — AB (ref 4.0–10.5)

## 2018-04-20 LAB — MRSA PCR SCREENING: MRSA BY PCR: NEGATIVE

## 2018-04-20 LAB — HIV ANTIBODY (ROUTINE TESTING W REFLEX): HIV Screen 4th Generation wRfx: NONREACTIVE

## 2018-04-20 MED ORDER — POTASSIUM CHLORIDE CRYS ER 20 MEQ PO TBCR: 40.0000 meq | EXTENDED_RELEASE_TABLET | ORAL | Status: AC

## 2018-04-20 MED ORDER — LIP MEDEX EX OINT
TOPICAL_OINTMENT | CUTANEOUS | Status: DC | PRN
  Administered 2018-04-21: 22:00:00 via TOPICAL

## 2018-04-20 MED ORDER — ORAL CARE MOUTH RINSE
15.0000 mL | Freq: Two times a day (BID) | OROMUCOSAL | Status: DC
  Administered 2018-04-20 – 2018-04-23 (×6): 15 mL via OROMUCOSAL

## 2018-04-20 MED ORDER — LIP MEDEX EX OINT
TOPICAL_OINTMENT | CUTANEOUS | Status: AC
  Administered 2018-04-20: 19:00:00
  Filled 2018-04-20: qty 7

## 2018-04-20 NOTE — Progress Notes (Addendum)
PROGRESS NOTE    [name]   KDT:[id number]  DOB: 1955/11/01  DOA: 04/19/2018 PCP: Raelyn Number, MD   Brief Narrative:  [name]  a 62 y.o. male who resides in a SNF with medical history significant of low IQ, Crohn's disease on Humira, BPH with indwelling foley placed on 6/21 in ED. Foley changed at urology office.  Patient presents to the ED with fever of 103 .   Subjective: The patient has no complaints today. I have spoken with his sister who states he has declined considerable over the past month form being able to ambulate independently to now being severely weak. He has not been getting enough to eat and has recently been found to have urinary retention and had a foley placed. He followed up with Urology, Dr Gloriann Loan, who recommended Flomax and then a voiding trial.     Assessment & Plan:   Principal Problem:   Catheter-associated urinary tract infection in immune compromised patient Sepsis - given Vanc and Zosyn and then changed to and Cefepime- d/c'd Vanc - culture neg thus far - WBC improved-  - fevers resolved  Active Problems:   Crohn's disease  - stable on Humira and Lialda  Grade 2 d CHF - holding Lasix and hydrating- stop IVF today and follow  BPH - cont foley and Flomax- voiding trial to be done prior to d/c   Seizure - cont Keppra    Mental disability - lives in AL and needs a great deal of care  DVT prophylaxis: Lovenox Code Status: Full code Family Communication: none Disposition Plan: will need to go to SNF when stable Consultants:    Procedures:    Antimicrobials:  Anti-infectives (From admission, onward)   Start     Dose/Rate Route Frequency Ordered Stop   04/19/18 2300  ceFEPIme (MAXIPIME) 2 g in sodium chloride 0.9 % 100 mL IVPB     2 g 200 mL/hr over 30 Minutes Intravenous Every 12 hours 04/19/18 2046     04/19/18 1715  levofloxacin (LEVAQUIN) IVPB 750 mg  Status:  Discontinued     750 mg 100 mL/hr over 90 Minutes  Intravenous  Once 04/19/18 1702 04/19/18 1708   04/19/18 1715  aztreonam (AZACTAM) 2 g in sodium chloride 0.9 % 100 mL IVPB  Status:  Discontinued     2 g 200 mL/hr over 30 Minutes Intravenous  Once 04/19/18 1702 04/19/18 1708   04/19/18 1715  vancomycin (VANCOCIN) IVPB 1000 mg/200 mL premix  Status:  Discontinued     1,000 mg 200 mL/hr over 60 Minutes Intravenous  Once 04/19/18 1702 04/19/18 1708   04/19/18 1715  piperacillin-tazobactam (ZOSYN) IVPB 3.375 g     3.375 g 100 mL/hr over 30 Minutes Intravenous  Once 04/19/18 1709 04/19/18 1738   04/19/18 1715  vancomycin (VANCOCIN) IVPB 1000 mg/200 mL premix  Status:  Discontinued     1,000 mg 200 mL/hr over 60 Minutes Intravenous  Once 04/19/18 1709 04/19/18 1710   04/19/18 1715  vancomycin (VANCOCIN) 2,000 mg in sodium chloride 0.9 % 500 mL IVPB     2,000 mg 250 mL/hr over 120 Minutes Intravenous  Once 04/19/18 1710 04/19/18 1955       Objective: Vitals:   04/19/18 2100 04/19/18 2149 04/20/18 0525 04/20/18 1022  BP: (!) 126/50 (!) 120/53 (!) 114/50   Pulse: 79 80 85 88  Resp: 20 (!) 24 (!) 25   Temp:  98.7 F (37.1 C) 98.6 F (37 C)  TempSrc:  Oral Oral   SpO2: 97% 97% 96% 94%  Weight:        Intake/Output Summary (Last 24 hours) at 04/20/2018 1028 Last data filed at 04/20/2018 0600 Gross per 24 hour  Intake 2342.5 ml  Output 1000 ml  Net 1342.5 ml   Filed Weights   04/19/18 1632  Weight: 104.3 kg (230 lb)    Examination: General exam: Appears comfortable  HEENT: PERRLA, oral mucosa moist, no sclera icterus or thrush Respiratory system: Clear to auscultation. Respiratory effort normal. Cardiovascular system: S1 & S2 heard, RRR.   Gastrointestinal system: Abdomen soft, non-tender, nondistended. Normal bowel sound. No organomegaly Central nervous system: Alert and oriented. No focal neurological deficits. Extremities: No cyanosis, clubbing or edema Skin: No rashes or ulcers Psychiatry:  Mood & affect appropriate.    Data Reviewed: I have personally reviewed following labs and imaging studies  CBC: Recent Labs  Lab 04/19/18 1707 04/20/18 0517  WBC 15.5* 12.4*  NEUTROABS 13.0*  --   HGB 12.0* 10.7*  HCT 37.0* 33.1*  MCV 94.9 95.1  PLT 331 845   Basic Metabolic Panel: Recent Labs  Lab 04/19/18 1707 04/20/18 0517  NA 141 140  K 3.5 3.4*  CL 109 113*  CO2 23 19*  GLUCOSE 128* 95  BUN 18 15  CREATININE 1.50* 1.21  CALCIUM 8.7* 8.0*   GFR: Estimated Creatinine Clearance: 75.6 mL/min (by C-G formula based on SCr of 1.21 mg/dL). Liver Function Tests: Recent Labs  Lab 04/19/18 1707  AST 21  ALT 22  ALKPHOS 162*  BILITOT 1.6*  PROT 7.7  ALBUMIN 3.0*   No results for input(s): LIPASE, AMYLASE in the last 168 hours. No results for input(s): AMMONIA in the last 168 hours. Coagulation Profile: No results for input(s): INR, PROTIME in the last 168 hours. Cardiac Enzymes: No results for input(s): CKTOTAL, CKMB, CKMBINDEX, TROPONINI in the last 168 hours. BNP (last 3 results) No results for input(s): PROBNP in the last 8760 hours. HbA1C: No results for input(s): HGBA1C in the last 72 hours. CBG: No results for input(s): GLUCAP in the last 168 hours. Lipid Profile: No results for input(s): CHOL, HDL, LDLCALC, TRIG, CHOLHDL, LDLDIRECT in the last 72 hours. Thyroid Function Tests: No results for input(s): TSH, T4TOTAL, FREET4, T3FREE, THYROIDAB in the last 72 hours. Anemia Panel: No results for input(s): VITAMINB12, FOLATE, FERRITIN, TIBC, IRON, RETICCTPCT in the last 72 hours. Urine analysis:    Component Value Date/Time   COLORURINE AMBER (A) 04/19/2018 1707   APPEARANCEUR CLOUDY (A) 04/19/2018 1707   LABSPEC 1.013 04/19/2018 1707   PHURINE 5.0 04/19/2018 1707   GLUCOSEU NEGATIVE 04/19/2018 1707   HGBUR LARGE (A) 04/19/2018 1707   BILIRUBINUR NEGATIVE 04/19/2018 1707   KETONESUR NEGATIVE 04/19/2018 1707   PROTEINUR 30 (A) 04/19/2018 1707   UROBILINOGEN 1.0 04/01/2014 1057    NITRITE POSITIVE (A) 04/19/2018 1707   LEUKOCYTESUR LARGE (A) 04/19/2018 1707   Sepsis Labs: @LABRCNTIP (procalcitonin:4,lacticidven:4) ) Recent Results (from the past 240 hour(s))  Urine Culture     Status: None   Collection Time: 04/12/18  3:41 PM  Result Value Ref Range Status   Specimen Description URINE, CATHETERIZED  Final   Special Requests NONE  Final   Culture   Final    NO GROWTH Performed at [facility], [place] [address], [place], [place] [address]    Report Status 04/13/2018 FINAL  Final  Blood Culture (routine x 2)     Status: None (Preliminary result)  Collection Time: 04/19/18  5:07 PM  Result Value Ref Range Status   Specimen Description   Final    BLOOD RIGHT ARM Performed at [facility], [address], [place], [place] [address]    Special Requests   Final    BOTTLES DRAWN AEROBIC AND ANAEROBIC Blood Culture adequate volume Performed at [facility] [address], [place], [place] [address]    Culture   Final    NO GROWTH < 12 HOURS Performed at [facility] [address] [place], [place] [address]    Report Status PENDING  Incomplete  Blood Culture (routine x 2)     Status: None (Preliminary result)   Collection Time: 04/19/18  5:07 PM  Result Value Ref Range Status   Specimen Description   Final    BLOOD RIGHT ANTECUBITAL Performed at [facility] [address] [place], [place] [address]    Special Requests   Final    BOTTLES DRAWN AEROBIC AND ANAEROBIC Blood Culture adequate volume Performed at [facility] [address] [place], [place] [address]    Culture   Final    NO GROWTH < 12 HOURS Performed at [facility] [address], [place], [place] [address]    Report Status PENDING  Incomplete  MRSA PCR Screening     Status: None   Collection Time: 04/20/18  2:51 AM  Result Value Ref Range Status   MRSA by PCR NEGATIVE NEGATIVE Final    Comment:         The GeneXpert MRSA Assay (FDA approved for NASAL specimens only), is one component of a comprehensive MRSA colonization surveillance program. It is not intended to diagnose MRSA infection nor to guide or monitor treatment for MRSA infections. Performed at [facility], [place] [address] [place], [place] [address]          Radiology Studies: Dg Chest 1 View  Result Date: 04/19/2018 CLINICAL DATA:  Dry cough for 2-3 days. EXAM: CHEST  1 VIEW COMPARISON:  01/17/2016 FINDINGS: Stable mild cardiomegaly. Slight uncoiling of the thoracic aorta without aneurysm. The lungs are free of pulmonary consolidations. No pulmonary edema.No acute osseous abnormality. IMPRESSION: No active disease.  Stable mild cardiomegaly. Electronically Signed   By: Ashley Royalty M.D.   On: 04/19/2018 18:11      Scheduled Meds: . aspirin  81 mg Oral Daily  . enoxaparin (LOVENOX) injection  40 mg Subcutaneous Q24H  . ipratropium  1 spray Each Nare BID  . levETIRAcetam  500 mg Oral BID  . loratadine  10 mg Oral Daily  . mouth rinse  15 mL Mouth Rinse BID  . mesalamine  2.4 g Oral Q breakfast  . multivitamins with iron  1 tablet Oral Daily  . potassium chloride  40 mEq Oral Q4H  . primidone  500 mg Oral BID  . tamsulosin  0.4 mg Oral Daily   Continuous Infusions: . sodium chloride 125 mL/hr at 04/20/18 0709  . ceFEPime (MAXIPIME) IV Stopped (04/19/18 2310)     LOS: 1 day    Time spent in minutes: 36 min    Debbe Odea, MD [facility] Pager: [email or web address] Password [facility] 04/20/2018, 10:28 AM

## 2018-04-20 NOTE — Progress Notes (Signed)
Patient arrived to the unit at approximately 2143. He is alert and verbally responsive but has noted mental disability.Peri-care done. Had foul odor coming from penis. No complaints voiced at this time.

## 2018-04-21 DIAGNOSIS — N3001 Acute cystitis with hematuria: Secondary | ICD-10-CM

## 2018-04-21 MED ORDER — POTASSIUM CHLORIDE CRYS ER 20 MEQ PO TBCR: 40.0000 meq | EXTENDED_RELEASE_TABLET | ORAL | Status: AC

## 2018-04-22 ENCOUNTER — Inpatient Hospital Stay (HOSPITAL_COMMUNITY): Payer: Medicare Other

## 2018-04-22 LAB — CBC
HCT: 34.6 % — ABNORMAL LOW (ref 39.0–52.0)
HEMOGLOBIN: 10.9 g/dL — AB (ref 13.0–17.0)
MCH: 30.9 pg (ref 26.0–34.0)
MCHC: 31.5 g/dL (ref 30.0–36.0)
MCV: 98 fL (ref 78.0–100.0)
Platelets: 260 10*3/uL (ref 150–400)
RBC: 3.53 MIL/uL — ABNORMAL LOW (ref 4.22–5.81)
RDW: 17.4 % — ABNORMAL HIGH (ref 11.5–15.5)
WBC: 7.3 10*3/uL (ref 4.0–10.5)

## 2018-04-22 LAB — BASIC METABOLIC PANEL
Anion gap: 7 (ref 5–15)
BUN: 16 mg/dL (ref 8–23)
CALCIUM: 8.2 mg/dL — AB (ref 8.9–10.3)
CO2: 20 mmol/L — AB (ref 22–32)
Chloride: 115 mmol/L — ABNORMAL HIGH (ref 98–111)
Creatinine, Ser: 1.18 mg/dL (ref 0.61–1.24)
GFR calc Af Amer: >60 mL/min (ref >60)
GFR calc non Af Amer: >60 mL/min (ref >60)
GLUCOSE: 89 mg/dL (ref 70–99)
Potassium: 3.6 mmol/L (ref 3.5–5.1)
Sodium: 142 mmol/L (ref 135–145)

## 2018-04-22 NOTE — Progress Notes (Signed)
Received in report this PM from day shift that patient had foley removed and had not voided since removal. When bladder scanned at 1830, it showed 86m in bladder. Scanned again this evening at 2255 and it showed 2784m Will continue to reassess and notify the MD if necessary.

## 2018-04-22 NOTE — Progress Notes (Signed)
Modified Barium Swallow Progress Note  Patient Details  Name: EDVARDO HONSE MRN: [id number] Date of Birth: [birth year]  Today's Date: 04/22/2018  Modified Barium Swallow completed.  Full report located under Chart Review in the Imaging Section.  Brief recommendations include the following:  Clinical Impression  Pt present with swallow ability largely consistent with findings from prior MBS3/28/2017.  Oral swallow is functional, pharyngeal and cervical esophageal deficits noted with sensorimotor and mechanical deficits.  Pt appears with fused cervical spine that narrows pharyngeal space and prevent adequate epiglottic deflection.  In addition, laryngeal elevation/closure impaired which allows consistent laryngeal penetration of thin/nectar that largely clears with swallows.  Trace aspiration of thin noted which pt cleared with reflexive throat clearing.  Increased visocity results in more pharyngeal residuals - most notably with solids - puree/cracker.  Liquid swallows helpful to decrease solid residuals and dry swallows x3-4 helpful to decrease liquid residuals in pharynx.  Pt with chronic dysphagia and likely episodically aspirates.  However he has been tolerating po as his CXRs are repeatedly negative and he appears to be well nourished.  Using monitor, educated pt to findings and reinforced effective compensation strategies.  Chin tuck and head turn NOT HELPFUL~   SLP will follow up and educate family/pt to reinforce effective compensations.  Thanks for this referral.     Swallow Evaluation Recommendations       SLP Diet Recommendations: Dysphagia 3 (Mech soft) solids;Thin liquid   Liquid Administration via: Cup;Straw   Medication Administration: Crushed with puree       Compensations: Small sips/bites;Slow rate;Follow solids with liquid(multiple swallows with liquids (2-3))   Postural Changes: Seated upright at 90 degrees;Remain semi-upright after after feeds/meals (Comment)   Oral  Care Recommendations: Oral care BID       Luanna Salk, MS [facility] SLP [phone]  Macario Golds 04/22/2018,4:02 PM

## 2018-04-22 NOTE — Care Management Important Message (Signed)
Important Message  Patient Details  Name: KENT RIENDEAU MRN: [id number] Date of Birth: 07-Dec-1955   Medicare Important Message Given:  Yes    Kerin Salen 04/22/2018, 11:37 AMImportant Message  Patient Details  Name: JOSELUIS ALESSIO MRN: [id number] Date of Birth: Dec 13, 1955   Medicare Important Message Given:  Yes    Kerin Salen 04/22/2018, 11:37 AM

## 2018-04-22 NOTE — Progress Notes (Signed)
Pharmacy Antibiotic Note  GLENMORE KARL is a 62 y.o. male admitted from ALF on 04/19/2018 with UTI.  Recent foley catheter placement last week on 6/21 at [facility] ED and subsequently changed by urology as an outpatient.  Pharmacy has been consulted for Cefepime dosing.  Today, 04/22/18  WBC 7.3   Afebrile  Urine culture pending  Plan:  Continue cefepime 2g IV q12h  Follow up renal fxn, culture results, and clinical course  F/u ability to de-escalate antibiotics   Weight: 230 lb (104.3 kg)  Temp (24hrs), Avg:98.2 F (36.8 C), Min:98.1 F (36.7 C), Max:98.3 F (36.8 C)  Recent Labs  Lab 04/19/18 1707 04/19/18 1724 04/19/18 2002 04/20/18 0517 04/22/18 0617  WBC 15.5*  --   --  12.4* 7.3  CREATININE 1.50*  --   --  1.21 1.18  LATICACIDVEN  --  1.37 0.77  --   --     Estimated Creatinine Clearance: 77.5 mL/min (by C-G formula based on SCr of 1.18 mg/dL).    Allergies  Allergen Reactions  . Darvon [Propoxyphene] Other (See Comments)    Unknown reaction    Antimicrobials this admission: 6/28 Vanc x1 dose 6/28 Zosyn x1 dose 6/28 Cefepime >>   Dose adjustments this admission:  Microbiology results: 6/28 BCx: No growth 2 days 6/28 UCx: >100K Klebsiella pneumoniae  Thank you for allowing pharmacy to be a part of this patient's care.  Theodis Shove, PharmD, BCPS Clinical Pharmacist Pager: [phone] 04/22/18 11:58 AM

## 2018-04-22 NOTE — NC FL2 (Signed)
[place] LEVEL OF CARE SCREENING TOOL     IDENTIFICATION  Patient Name: [name] Birthdate: 06-Sep-1956 Sex: male Admission Date (Current Location): 04/19/2018  [facility] and [place] Number:  Herbalist and Address:  [facility],  [place] [address], [place]      Provider Number: [id number]  Attending Physician Name and Address:  Debbe Odea, MD  Relative Name and Phone Number:       Current Level of Care: Hospital Recommended Level of Care: [place] Prior Approval Number:    Date Approved/Denied:   PASRR Number:    Discharge Plan: SNF    Current Diagnoses: Patient Active Problem List   Diagnosis Date Noted  . Acute cystitis with hematuria   . Diastolic CHF, chronic ([place]) 04/20/2018  . Catheter-associated urinary tract infection ([place]) 04/19/2018  . Mental disability   . S/P colostomy takedown 08/01/2016  . 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 Sepsis ([place]) 12/01/2015  . Localization-related idiopathic epilepsy and epileptic syndromes with seizures of localized onset, not intractable, without status epilepticus ([place]) 03/30/2015  . Aortic valve stenosis, mild 03/31/2014  . Aortic valve regurgitation 03/31/2014  . Pseudopolyposis of colon ([place]) 01/03/2012  . Crohn's disease ([place]) 12/19/2011  . Personal history of immunosupression therapy 12/19/2011  . ECZEMA 08/21/2008  . CHOLELITHIASIS, ASYMPTOMATIC 01/29/2008  . VITAMIN B12 DEFICIENCY 01/17/2008    Orientation RESPIRATION BLADDER Height & Weight     Self  Normal Incontinent, Indwelling catheter Weight: 230 lb (104.3 kg) Height:     BEHAVIORAL SYMPTOMS/MOOD NEUROLOGICAL BOWEL NUTRITION STATUS      Continent Diet  AMBULATORY STATUS COMMUNICATION OF NEEDS Skin   Extensive Assist Verbally Normal                       Personal Care Assistance Level of Assistance  Bathing, Feeding, Dressing Bathing  Assistance: Limited assistance Feeding assistance: Independent Dressing Assistance: Limited assistance     Functional Limitations Info  Sight, Hearing, Speech Sight Info: Adequate Hearing Info: Adequate Speech Info: Adequate    SPECIAL CARE FACTORS FREQUENCY  PT (By licensed PT), OT (By licensed OT)     PT Frequency: 5x/week OT Frequency: 5x/week            Contractures Contractures Info: Not present    Additional Factors Info  Code Status, Allergies Code Status Info: Full Allergies Info: Darvon Propoxyphene           Current Medications (04/22/2018):  This is the current hospital active medication list Current Facility-Administered Medications  Medication Dose Route Frequency Provider Last Rate Last Dose  . acetaminophen (TYLENOL) tablet 650 mg  650 mg Oral Q6H PRN Etta Quill, DO       Or  . acetaminophen (TYLENOL) suppository 650 mg  650 mg Rectal Q6H PRN Etta Quill, DO      . aspirin chewable tablet 81 mg  81 mg Oral Daily Jennette Kettle M, DO   81 mg at 04/22/18 1035  . ceFEPIme (MAXIPIME) 2 g in sodium chloride 0.9 % 100 mL IVPB  2 g Intravenous Q12H Shade, Haze Justin, RPH   Stopped at 04/22/18 1106  . enoxaparin (LOVENOX) injection 40 mg  40 mg Subcutaneous Q24H Jennette Kettle M, DO   40 mg at 04/22/18 0804  . ipratropium (ATROVENT) 0.03 % nasal spray 1 spray  1 spray Each Nare BID Etta Quill,  DO   1 spray at 04/22/18 1036  . levETIRAcetam (KEPPRA) tablet 500 mg  500 mg Oral BID Jennette Kettle M, DO   500 mg at 04/22/18 1035  . lip balm (CARMEX) ointment   Topical PRN Debbe Odea, MD      . loratadine (CLARITIN) tablet 10 mg  10 mg Oral Daily Jennette Kettle M, DO   10 mg at 04/22/18 1035  . MEDLINE mouth rinse  15 mL Mouth Rinse BID Etta Quill, DO   15 mL at 04/21/18 2156  . mesalamine (LIALDA) EC tablet 2.4 g  2.4 g Oral Q breakfast Etta Quill, DO   2.4 g at 04/22/18 0804  . multivitamins with iron tablet 1 tablet  1 tablet Oral  Daily Jennette Kettle M, DO   1 tablet at 04/22/18 1035  . olopatadine (PATANOL) 0.1 % ophthalmic solution 1 drop  1 drop Both Eyes PRN Etta Quill, DO      . ondansetron [facility]) tablet 4 mg  4 mg Oral Q6H PRN Etta Quill, DO       Or  . ondansetron [facility]) injection 4 mg  4 mg Intravenous Q6H PRN Etta Quill, DO      . primidone (MYSOLINE) tablet 500 mg  500 mg Oral BID Jennette Kettle M, DO   500 mg at 04/22/18 1035  . tamsulosin (FLOMAX) capsule 0.4 mg  0.4 mg Oral Daily Jennette Kettle M, DO   0.4 mg at 04/22/18 1035     Discharge Medications: Please see discharge summary for a list of discharge medications.  Relevant Imaging Results:  Relevant Lab Results:   Additional Information SSN [id number].   Servando Snare, LCSW

## 2018-04-22 NOTE — Clinical Social Work Note (Signed)
Clinical Social Work Assessment  Patient Details  Name: [name] MRN: [id number] Date of Birth: June 05, 1956  Date of referral:  04/22/18               Reason for consult:  Facility Placement                Permission sought to share information with:  Case Manager, Customer service manager, Family Supports Permission granted to share information::  No(Patient has legal guardian)  Name::     Animal nutritionist::  [facility]  Relationship::  Sister  Sport and exercise psychologist Information:     Housing/Transportation Living arrangements for the past 2 months:  [place] of Information:  Siblings, Facility Patient Interpreter Needed:  None Criminal Activity/Legal Involvement Pertinent to Current Situation/Hospitalization:  No - Comment as needed Significant Relationships:  Warehouse manager, Siblings Lives with:  Facility Resident Do you feel safe going back to the place where you live?  Yes Need for family participation in patient care:  Yes (Comment)  Care giving concerns:  Sister [name] reports that she is concerned about patients falls and feels he will benefit from rehab before returning to ALF.   Social Worker assessment / plan:  LCSW consulted for facility placement.   Patient is a resident at [facility]. Patient admitted for UTI   LCSW spoke with sister , [name] by phone. Patient has MR and is only oriented to self.   Patient's sister feels that patient needs rehab prior to returning to [facility]. Patient has has multiple falls according to sister and patient's medical records.   PLAN: SNF for rehab at dc. Patient has bed at [facility].   Employment status:  Disabled (Comment on whether or not currently receiving Disability) Insurance information:  Managed Medicare PT Recommendations:  [place] / Referral to community resources:     Patient/Family's Response to care:  Sister is thankful for LCSW coordination in [employer].    Patient/Family's Understanding of and Emotional Response to Diagnosis, Current Treatment, and Prognosis:  Sister, [name] is very concerned about patient's safety to return to ALF prior to rehab. [name] is relieved that PT is recommending SNF for rehab.  Patient's sister has a clear understanding of patient diagnosis and treatment plan.   Emotional Assessment Appearance:  Appears stated age Attitude/Demeanor/Rapport:    Affect (typically observed):  Pleasant Orientation:  Oriented to Self Alcohol / Substance use:  Not Applicable Psych involvement (Current and /or in the community):  No (Comment)  Discharge Needs  Concerns to be addressed:  No discharge needs identified Readmission within the last 30 days:  No Current discharge risk:  None Barriers to Discharge:  Continued Medical Work up   [employer], LCSW 04/22/2018, 3:45 PM

## 2018-04-22 NOTE — Evaluation (Addendum)
Physical Therapy Evaluation Patient Details Name: [name] MRN: [id number] DOB: [birth year] Today's Date: 04/22/2018   History of Present Illness  [name]  a 62 y.o. male who resides in a SNF with medical history significant of low IQ, Crohn's disease on Humira, BPH with indwelling foley placed on 6/21 in ED. Foley changed at urology office.  Admitted with UTI.  Clinical Impression  Patient presents with decreased independence and safety with mobility due to weakness, decreased safety, recent falls x 4, decreased balance, decreased safety awareness and will benefit from skilled PT in the acute setting to maximize mobility prior to d/c to SNF level rehab.     Follow Up Recommendations SNF(sister hoping for SNF at [facility])    Equipment Recommendations  Other (comment)(TBA at facility)    Recommendations for Other Services       Precautions / Restrictions Precautions Precautions: Fall Precaution Comments: watch O2      Mobility  Bed Mobility Overal bed mobility: Needs Assistance Bed Mobility: Supine to Sit     Supine to sit: Mod assist;HOB elevated        Transfers Overall transfer level: Needs assistance Equipment used: Rolling walker (2 wheeled) Transfers: Sit to/from Stand Sit to Stand: Min assist;+2 physical assistance         General transfer comment: assist for safety, cues for hand placement increased time  Ambulation/Gait Ambulation/Gait assistance: Mod assist;+2 physical assistance Gait Distance (Feet): 2 Feet Assistive device: Rolling walker (2 wheeled) Gait Pattern/deviations: Step-to pattern;Leaning posteriorly;Decreased stride length     General Gait Details: stand step to chair mod A for safety and pt fearful of falling so limited ambulation   Stairs            Wheelchair Mobility    Modified Rankin (Stroke Patients Only)       Balance Overall balance assessment: Needs assistance Sitting-balance support: Feet  supported Sitting balance-Leahy Scale: Poor Sitting balance - Comments: R lateral lean   Standing balance support: Bilateral upper extremity supported Standing balance-Leahy Scale: Poor Standing balance comment: UE support and assist for balance                             Pertinent Vitals/Pain Pain Assessment: Faces Faces Pain Scale: Hurts little more Pain Location: buttocks scooting on EOB Pain Descriptors / Indicators: Sore Pain Intervention(s): Monitored during session;Repositioned;Other (comment)(applied cream)    Home Living Family/patient expects to be discharged to:: Skilled nursing facility                 Additional Comments: from SNF/ALF and reports needs 2 person assist to get up to chair, has fallen four times recent from bed or chair    Prior Function           Comments: Sister from [facility] called and stated patient at ALF at [facility] since last week due to previous facility not helping him eat or taking care of him.  States he was walking up until about a month ago and has had steady decline.     Hand Dominance        Extremity/Trunk Assessment   Upper Extremity Assessment Upper Extremity Assessment: RUE deficits/detail;LUE deficits/detail RUE Deficits / Details: AAROM grossly WFL; strength at least 3/5 shoulder flexion 3+/5 elbow flex/ext LUE Deficits / Details: AAROM WFL; strength < 3/5 shoulder flexion, 3+/5 elbow flexion 2/5 extension    Lower Extremity Assessment Lower Extremity Assessment: RLE deficits/detail;LLE deficits/detail  RLE Deficits / Details: AAROM WFL, strength hip flexion 2+/5, knee extension 4-/5 RLE Sensation: decreased light touch LLE Deficits / Details: AAROM WFL. strength hip flexion 2/5, knee extension 3-/5 LLE Sensation: decreased light touch    Cervical / Trunk Assessment Cervical / Trunk Assessment: Kyphotic;Other exceptions Cervical / Trunk Exceptions: cervical flexion limitations   Communication       Cognition Arousal/Alertness: Awake/alert Behavior During Therapy: WFL for tasks assessed/performed Overall Cognitive Status: No family/caregiver present to determine baseline cognitive functioning                                        General Comments General comments (skin integrity, edema, etc.): spoke by phone with sister, Rich Number [facility]) who gave information about history that he was able to walk until about a month ago and has been neglected in the facility, recently she came and moved him to a new facility ([facility]) due to poor care and his rapid decline.  Patient in bed with orange juice spilled in bed; upon cleaning noted pink skin on bottom so applied cream and educated to limit sitting to 2 hours and RN aware.     Exercises     Assessment/Plan    PT Assessment Patient needs continued PT services  PT Problem List Decreased strength;Decreased mobility;Decreased safety awareness;Decreased activity tolerance;Decreased balance;Decreased knowledge of use of DME       PT Treatment Interventions Gait training;Therapeutic activities;DME instruction;Functional mobility training;Balance training;Patient/family education;Therapeutic exercise    PT Goals (Current goals can be found in the Care Plan section)  Acute Rehab PT Goals Patient Stated Goal: to get rehab in facility PT Goal Formulation: With patient/family Time For Goal Achievement: 05/06/18 Potential to Achieve Goals: Good    Frequency Min 2X/week   Barriers to discharge        Co-evaluation               AM-PAC PT "6 Clicks" Daily Activity  Outcome Measure Difficulty turning over in bed (including adjusting bedclothes, sheets and blankets)?: Unable Difficulty moving from lying on back to sitting on the side of the bed? : Unable Difficulty sitting down on and standing up from a chair with arms (e.g., wheelchair, bedside commode, etc,.)?: Unable Help needed moving to and from a bed to  chair (including a wheelchair)?: A Lot Help needed walking in hospital room?: A Lot Help needed climbing 3-5 steps with a railing? : Total 6 Click Score: 8    End of Session Equipment Utilized During Treatment: Gait belt Activity Tolerance: Patient tolerated treatment well Patient left: with call bell/phone within reach;in chair Nurse Communication: Mobility status PT Visit Diagnosis: Muscle weakness (generalized) (M62.81);Other abnormalities of gait and mobility (R26.89);History of falling (Z91.81);Other symptoms and signs involving the nervous system (R29.898)    Time: 5883-2549 PT Time Calculation (min) (ACUTE ONLY): 33 min   Charges:   PT Evaluation $PT Eval Moderate Complexity: 1 Mod PT Treatments $Therapeutic Activity: 8-22 mins   PT G CodesMagda Kiel, [place] [phone] 04/22/2018   Reginia Naas 04/22/2018, 1:14 PM

## 2018-04-22 NOTE — Progress Notes (Signed)
PROGRESS NOTE    [name]   JKD:[id number]  DOB: 10-25-1955  DOA: 04/19/2018 PCP: Raelyn Number, MD   Brief Narrative:  [name]  a 62 y.o. male who resides in a SNF with medical history significant of low IQ, Crohn's disease on Humira, BPH with indwelling foley placed on 6/21 in ED. Foley changed at urology office.  Patient presents to the ED with fever of 103 .   Subjective: The patient has no complaints today.   Assessment & Plan:   Principal Problem:   Catheter-associated urinary tract infection in immune compromised patient Sepsis - given Vanc and Zosyn and then changed to and Cefepime- d/c'd Vanc - U culture growing gram neg rods - WBC improved-  - fevers resolved  Active Problems:   Crohn's disease  - stable on Humira and Lialda  Hypokalemia - replaced and resolved  Grade 2 d CHF - holding Lasix and hydrating- stopped IVF 6/30    BPH - cont Flomax- voiding trial today   Seizure - cont Keppra    Mental disability - lives in AL and needs a great deal of care  DVT prophylaxis: Lovenox Code Status: Full code Family Communication: none Disposition Plan: will need to go to SNF when stable Consultants:    Procedures:    Antimicrobials:  Anti-infectives (From admission, onward)   Start     Dose/Rate Route Frequency Ordered Stop   04/19/18 2300  ceFEPIme (MAXIPIME) 2 g in sodium chloride 0.9 % 100 mL IVPB     2 g 200 mL/hr over 30 Minutes Intravenous Every 12 hours 04/19/18 2046     04/19/18 1715  levofloxacin (LEVAQUIN) IVPB 750 mg  Status:  Discontinued     750 mg 100 mL/hr over 90 Minutes Intravenous  Once 04/19/18 1702 04/19/18 1708   04/19/18 1715  aztreonam (AZACTAM) 2 g in sodium chloride 0.9 % 100 mL IVPB  Status:  Discontinued     2 g 200 mL/hr over 30 Minutes Intravenous  Once 04/19/18 1702 04/19/18 1708   04/19/18 1715  vancomycin (VANCOCIN) IVPB 1000 mg/200 mL premix  Status:  Discontinued     1,000 mg 200 mL/hr over 60  Minutes Intravenous  Once 04/19/18 1702 04/19/18 1708   04/19/18 1715  piperacillin-tazobactam (ZOSYN) IVPB 3.375 g     3.375 g 100 mL/hr over 30 Minutes Intravenous  Once 04/19/18 1709 04/19/18 1738   04/19/18 1715  vancomycin (VANCOCIN) IVPB 1000 mg/200 mL premix  Status:  Discontinued     1,000 mg 200 mL/hr over 60 Minutes Intravenous  Once 04/19/18 1709 04/19/18 1710   04/19/18 1715  vancomycin (VANCOCIN) 2,000 mg in sodium chloride 0.9 % 500 mL IVPB     2,000 mg 250 mL/hr over 120 Minutes Intravenous  Once 04/19/18 1710 04/19/18 1955       Objective: Vitals:   04/21/18 0513 04/21/18 1529 04/21/18 2102 04/22/18 0451  BP: (!) 130/56 (!) 117/52 (!) 137/56 (!) 127/57  Pulse: 83 90 76 75  Resp: 18 20 18 19   Temp: 99.1 F (37.3 C) 98.2 F (36.8 C) 98.3 F (36.8 C) 98.1 F (36.7 C)  TempSrc: Oral Oral Oral Oral  SpO2: 95% 96% 100% 98%  Weight:        Intake/Output Summary (Last 24 hours) at 04/22/2018 1044 Last data filed at 04/22/2018 1013 Gross per 24 hour  Intake 480 ml  Output 925 ml  Net -445 ml   Filed Weights   04/19/18 1632  Weight: 104.3 kg (230 lb)    Examination: General exam: Appears comfortable  HEENT: PERRLA, oral mucosa moist, no sclera icterus or thrush Respiratory system: Clear to auscultation. Respiratory effort normal. Cardiovascular system: S1 & S2 heard, RRR.   Gastrointestinal system: Abdomen soft, non-tender, nondistended. Normal bowel sound. No organomegaly Central nervous system: Alert and oriented. No focal neurological deficits. Extremities: No cyanosis, clubbing or edema Skin: No rashes or ulcers Psychiatry:  Mood & affect appropriate.   Data Reviewed: I have personally reviewed following labs and imaging studies  CBC: Recent Labs  Lab 04/19/18 1707 04/20/18 0517 04/22/18 0617  WBC 15.5* 12.4* 7.3  NEUTROABS 13.0*  --   --   HGB 12.0* 10.7* 10.9*  HCT 37.0* 33.1* 34.6*  MCV 94.9 95.1 98.0  PLT 331 271 366   Basic Metabolic  Panel: Recent Labs  Lab 04/19/18 1707 04/20/18 0517 04/22/18 0617  NA 141 140 142  K 3.5 3.4* 3.6  CL 109 113* 115*  CO2 23 19* 20*  GLUCOSE 128* 95 89  BUN 18 15 16   CREATININE 1.50* 1.21 1.18  CALCIUM 8.7* 8.0* 8.2*   GFR: Estimated Creatinine Clearance: 77.5 mL/min (by C-G formula based on SCr of 1.18 mg/dL). Liver Function Tests: Recent Labs  Lab 04/19/18 1707  AST 21  ALT 22  ALKPHOS 162*  BILITOT 1.6*  PROT 7.7  ALBUMIN 3.0*   No results for input(s): LIPASE, AMYLASE in the last 168 hours. No results for input(s): AMMONIA in the last 168 hours. Coagulation Profile: No results for input(s): INR, PROTIME in the last 168 hours. Cardiac Enzymes: No results for input(s): CKTOTAL, CKMB, CKMBINDEX, TROPONINI in the last 168 hours. BNP (last 3 results) No results for input(s): PROBNP in the last 8760 hours. HbA1C: No results for input(s): HGBA1C in the last 72 hours. CBG: No results for input(s): GLUCAP in the last 168 hours. Lipid Profile: No results for input(s): CHOL, HDL, LDLCALC, TRIG, CHOLHDL, LDLDIRECT in the last 72 hours. Thyroid Function Tests: No results for input(s): TSH, T4TOTAL, FREET4, T3FREE, THYROIDAB in the last 72 hours. Anemia Panel: No results for input(s): VITAMINB12, FOLATE, FERRITIN, TIBC, IRON, RETICCTPCT in the last 72 hours. Urine analysis:    Component Value Date/Time   COLORURINE AMBER (A) 04/19/2018 1707   APPEARANCEUR CLOUDY (A) 04/19/2018 1707   LABSPEC 1.013 04/19/2018 1707   PHURINE 5.0 04/19/2018 1707   GLUCOSEU NEGATIVE 04/19/2018 1707   HGBUR LARGE (A) 04/19/2018 1707   BILIRUBINUR NEGATIVE 04/19/2018 1707   KETONESUR NEGATIVE 04/19/2018 1707   PROTEINUR 30 (A) 04/19/2018 1707   UROBILINOGEN 1.0 04/01/2014 1057   NITRITE POSITIVE (A) 04/19/2018 1707   LEUKOCYTESUR LARGE (A) 04/19/2018 1707   Sepsis Labs: @LABRCNTIP (procalcitonin:4,lacticidven:4) ) Recent Results (from the past 240 hour(s))  Urine Culture     Status:  None   Collection Time: 04/12/18  3:41 PM  Result Value Ref Range Status   Specimen Description URINE, CATHETERIZED  Final   Special Requests NONE  Final   Culture   Final    NO GROWTH Performed at [facility], [place] [address], [place], [place] [address]    Report Status 04/13/2018 FINAL  Final  Blood Culture (routine x 2)     Status: None (Preliminary result)   Collection Time: 04/19/18  5:07 PM  Result Value Ref Range Status   Specimen Description   Final    BLOOD RIGHT ARM Performed at [facility], [place] [address], [place], [place]  [address]    Special Requests   Final    BOTTLES DRAWN AEROBIC AND ANAEROBIC Blood Culture adequate volume Performed at [facility] [address], [place], [place] [address]    Culture   Final    NO GROWTH 2 DAYS Performed at [facility] [address] [place], [place] [address]    Report Status PENDING  Incomplete  Blood Culture (routine x 2)     Status: None (Preliminary result)   Collection Time: 04/19/18  5:07 PM  Result Value Ref Range Status   Specimen Description   Final    BLOOD RIGHT ANTECUBITAL Performed at [facility] [address] [place], [place] [address]    Special Requests   Final    BOTTLES DRAWN AEROBIC AND ANAEROBIC Blood Culture adequate volume Performed at [facility] [address] [place], [place] [address]    Culture   Final    NO GROWTH 2 DAYS Performed at [facility] [address] [place], [place] [address]    Report Status PENDING  Incomplete  Urine culture     Status: Abnormal (Preliminary result)   Collection Time: 04/19/18  5:18 PM  Result Value Ref Range Status   Specimen Description   Final    URINE, RANDOM Performed at [facility], [place] [address], [place], [place] [address]    Special Requests   Final    NONE Performed at [facility], [place] [address] [place],  [place] [address]    Culture (A)  Final    >=100,000 COLONIES/mL GRAM NEGATIVE RODS CULTURE REINCUBATED FOR BETTER GROWTH Performed at [facility], [place] [address], [place], [place] [address]    Report Status PENDING  Incomplete  MRSA PCR Screening     Status: None   Collection Time: 04/20/18  2:51 AM  Result Value Ref Range Status   MRSA by PCR NEGATIVE NEGATIVE Final    Comment:        The GeneXpert MRSA Assay (FDA approved for NASAL specimens only), is one component of a comprehensive MRSA colonization surveillance program. It is not intended to diagnose MRSA infection nor to guide or monitor treatment for MRSA infections. Performed at [facility], [place] [address], [place], [place] [address]          Radiology Studies: No results found.    Scheduled Meds: . aspirin  81 mg Oral Daily  . enoxaparin (LOVENOX) injection  40 mg Subcutaneous Q24H  . ipratropium  1 spray Each Nare BID  . levETIRAcetam  500 mg Oral BID  . loratadine  10 mg Oral Daily  . mouth rinse  15 mL Mouth Rinse BID  . mesalamine  2.4 g Oral Q breakfast  . multivitamins with iron  1 tablet Oral Daily  . primidone  500 mg Oral BID  . tamsulosin  0.4 mg Oral Daily   Continuous Infusions: . ceFEPime (MAXIPIME) IV 2 g (04/22/18 1036)     LOS: 3 days    Time spent in minutes: 37 min    Debbe Odea, MD [facility] Pager: [email or web address] Password [facility] 04/22/2018, 10:44 AM

## 2018-04-23 ENCOUNTER — Emergency Department (HOSPITAL_COMMUNITY): Payer: Medicare Other

## 2018-04-23 ENCOUNTER — Other Ambulatory Visit: Payer: Self-pay

## 2018-04-23 ENCOUNTER — Encounter (HOSPITAL_COMMUNITY): Payer: Self-pay

## 2018-04-23 ENCOUNTER — Emergency Department (HOSPITAL_COMMUNITY)
Admission: EM | Admit: 2018-04-23 | Discharge: 2018-04-24 | Disposition: A | Discharge status: Home / Self Care | Payer: Medicare Other | Attending: Emergency Medicine | Admitting: Emergency Medicine

## 2018-04-23 DIAGNOSIS — K8020 Calculus of gallbladder without cholecystitis without obstruction: Secondary | ICD-10-CM | POA: Diagnosis not present

## 2018-04-23 DIAGNOSIS — Z79899 Other long term (current) drug therapy: Secondary | ICD-10-CM | POA: Insufficient documentation

## 2018-04-23 DIAGNOSIS — Z86718 Personal history of other venous thrombosis and embolism: Secondary | ICD-10-CM | POA: Insufficient documentation

## 2018-04-23 DIAGNOSIS — R0600 Dyspnea, unspecified: Secondary | ICD-10-CM | POA: Insufficient documentation

## 2018-04-23 DIAGNOSIS — I5032 Chronic diastolic (congestive) heart failure: Secondary | ICD-10-CM | POA: Insufficient documentation

## 2018-04-23 DIAGNOSIS — Z7982 Long term (current) use of aspirin: Secondary | ICD-10-CM | POA: Insufficient documentation

## 2018-04-23 LAB — URINE CULTURE: Culture: >=100000 — AB

## 2018-04-23 LAB — COMPREHENSIVE METABOLIC PANEL
ALBUMIN: 2.2 g/dL — AB (ref 3.5–5.0)
ALT: 18 U/L (ref 0–44)
ANION GAP: 4 — AB (ref 5–15)
AST: 19 U/L (ref 15–41)
Alkaline Phosphatase: 128 U/L — ABNORMAL HIGH (ref 38–126)
BILIRUBIN TOTAL: 0.5 mg/dL (ref 0.3–1.2)
BUN: 19 mg/dL (ref 8–23)
CO2: 24 mmol/L (ref 22–32)
Calcium: 8.7 mg/dL — ABNORMAL LOW (ref 8.9–10.3)
Chloride: 120 mmol/L — ABNORMAL HIGH (ref 98–111)
Creatinine, Ser: 1.13 mg/dL (ref 0.61–1.24)
GFR calc Af Amer: >60 mL/min (ref >60)
GFR calc non Af Amer: >60 mL/min (ref >60)
GLUCOSE: 107 mg/dL — AB (ref 70–99)
POTASSIUM: 3.3 mmol/L — AB (ref 3.5–5.1)
SODIUM: 148 mmol/L — AB (ref 135–145)
TOTAL PROTEIN: 6.9 g/dL (ref 6.5–8.1)

## 2018-04-23 LAB — CBC WITH DIFFERENTIAL/PLATELET
BASOS PCT: 1 %
Basophils Absolute: 0 10*3/uL (ref 0.0–0.1)
EOS ABS: 0 10*3/uL (ref 0.0–0.7)
Eosinophils Relative: 0 %
HEMATOCRIT: 31.8 % — AB (ref 39.0–52.0)
Hemoglobin: 10.1 g/dL — ABNORMAL LOW (ref 13.0–17.0)
Lymphocytes Relative: 14 %
Lymphs Abs: 0.7 10*3/uL (ref 0.7–4.0)
MCH: 30.8 pg (ref 26.0–34.0)
MCHC: 31.8 g/dL (ref 30.0–36.0)
MCV: 97 fL (ref 78.0–100.0)
MONO ABS: 0.6 10*3/uL (ref 0.1–1.0)
MONOS PCT: 11 %
Neutro Abs: 3.8 10*3/uL (ref 1.7–7.7)
Neutrophils Relative %: 74 %
Platelets: 308 10*3/uL (ref 150–400)
RBC: 3.28 MIL/uL — ABNORMAL LOW (ref 4.22–5.81)
RDW: 17.1 % — AB (ref 11.5–15.5)
WBC: 5.2 10*3/uL (ref 4.0–10.5)

## 2018-04-23 LAB — URINALYSIS, ROUTINE W REFLEX MICROSCOPIC
Bilirubin Urine: NEGATIVE
Glucose, UA: NEGATIVE mg/dL
Ketones, ur: NEGATIVE mg/dL
NITRITE: NEGATIVE
Protein, ur: 100 mg/dL — AB
SPECIFIC GRAVITY, URINE: 1.019 (ref 1.005–1.030)
pH: 5 (ref 5.0–8.0)

## 2018-04-23 LAB — TROPONIN I: Troponin I: <0.03 ng/mL (ref <0.03)

## 2018-04-23 LAB — BRAIN NATRIURETIC PEPTIDE: B Natriuretic Peptide: 134.4 pg/mL — ABNORMAL HIGH (ref 0.0–100.0)

## 2018-04-23 MED ORDER — IOPAMIDOL (ISOVUE-300) INJECTION 61%
INTRAVENOUS | Status: AC
  Filled 2018-04-23: qty 100

## 2018-04-23 MED ORDER — FUROSEMIDE 20 MG PO TABS: 20.0000 mg | ORAL_TABLET | Freq: Every day | ORAL | Status: DC | PRN

## 2018-04-23 MED ORDER — CEPHALEXIN 500 MG PO CAPS
500.0000 mg | ORAL_CAPSULE | Freq: Three times a day (TID) | ORAL | Status: DC
  Administered 2018-04-23: 500 mg via ORAL
  Filled 2018-04-23: qty 1

## 2018-04-23 MED ORDER — CEPHALEXIN 500 MG PO CAPS: 500.0000 mg | ORAL_CAPSULE | Freq: Three times a day (TID) | ORAL | Status: DC

## 2018-04-23 MED ORDER — IOPAMIDOL (ISOVUE-300) INJECTION 61%
100.0000 mL | Freq: Once | INTRAVENOUS | Status: AC | PRN
  Administered 2018-04-23: 100 mL via INTRAVENOUS

## 2018-04-23 NOTE — Progress Notes (Signed)
Physical Therapy Treatment Patient Details Name: [name] MRN: [id number] DOB: [birth year] Today's Date: 04/23/2018    History of Present Illness [name]  a 62 y.o. male who resides in a SNF with medical history significant of low IQ, Crohn's disease on Humira, BPH with indwelling foley placed on 6/21 in ED. Foley changed at urology office.  Admitted with UTI.    PT Comments    Pt progressing slowly, he is somewhat lethargic today and requiring more assist sitting balance; continue to recommend SNF, will follow acutely  Follow Up Recommendations  SNF     Equipment Recommendations  None recommended by PT    Recommendations for Other Services       Precautions / Restrictions Precautions Precautions: Fall Precaution Comments: watch O2 Restrictions Weight Bearing Restrictions: No    Mobility  Bed Mobility Overal bed mobility: Needs Assistance Bed Mobility: Supine to Sit;Sit to Supine     Supine to sit: Mod assist;HOB elevated Sit to supine: Mod assist   General bed mobility comments: assist with trunk to come to upright and to LEs onto bed; pt begins getting back to bed without warning  Transfers                    Ambulation/Gait                 Stairs             Wheelchair Mobility    Modified Rankin (Stroke Patients Only)       Balance   Sitting-balance support: Feet supported Sitting balance-Leahy Scale: Poor Sitting balance - Comments: assist to mainitain midline Postural control: Posterior lean;Right lateral lean     Standing balance comment: unable today, tries to get back in bed shortly after sitting upright                            Cognition Arousal/Alertness: Awake/alert(very sleepy but arouses) Behavior During Therapy: WFL for tasks assessed/performed Overall Cognitive Status: No family/caregiver present to determine baseline cognitive functioning Area of Impairment: Following  commands;Problem solving                       Following Commands: Follows one step commands with increased time;Follows multi-step commands inconsistently     Problem Solving: Slow processing;Decreased initiation;Requires verbal cues;Requires tactile cues        Exercises General Exercises - Lower Extremity Ankle Circles/Pumps: AROM;Both;5 reps Heel Slides: AROM;Both;5 reps    General Comments        Pertinent Vitals/Pain Pain Assessment: Faces Faces Pain Scale: Hurts little more Pain Location: back Pain Descriptors / Indicators: Discomfort;Grimacing Pain Intervention(s): Monitored during session    Home Living                      Prior Function            PT Goals (current goals can now be found in the care plan section) Acute Rehab PT Goals Patient Stated Goal: to get rehab in facility PT Goal Formulation: With patient/family Time For Goal Achievement: 05/06/18 Potential to Achieve Goals: Good Progress towards PT goals: Not progressing toward goals - comment(slowly progressing, lethargic)    Frequency    Min 2X/week      PT Plan Current plan remains appropriate    Co-evaluation  AM-PAC PT "6 Clicks" Daily Activity  Outcome Measure  Difficulty turning over in bed (including adjusting bedclothes, sheets and blankets)?: Unable Difficulty moving from lying on back to sitting on the side of the bed? : Unable Difficulty sitting down on and standing up from a chair with arms (e.g., wheelchair, bedside commode, etc,.)?: Unable Help needed moving to and from a bed to chair (including a wheelchair)?: Total Help needed walking in hospital room?: Total Help needed climbing 3-5 steps with a railing? : Total 6 Click Score: 6    End of Session   Activity Tolerance: Patient limited by fatigue Patient left: in bed;with call bell/phone within reach;with bed alarm set   PT Visit Diagnosis: Muscle weakness (generalized)  (M62.81);Other abnormalities of gait and mobility (R26.89);History of falling (Z91.81);Other symptoms and signs involving the nervous system (V01.314)     Time: 3888-7579 PT Time Calculation (min) (ACUTE ONLY): 23 min  Charges:  $Therapeutic Activity: 23-37 mins                    G CodesKenyon Ana, PT Pager: [phone] 04/23/2018    Kenyon Ana 04/23/2018, 1:28 PM

## 2018-04-23 NOTE — ED Notes (Signed)
Pt has condom cath in place for urine collection

## 2018-04-23 NOTE — ED Notes (Signed)
No urine noted in condom cath

## 2018-04-23 NOTE — ED Provider Notes (Signed)
[facility] DEPT Provider Note   CSN: [id number] Arrival date & time: 04/23/18  1649     History   Chief Complaint Chief Complaint  Patient presents with  . Shortness of Breath    HPI [name] is a 62 y.o. male.  HPI Patient was recently admitted to the hospital for urinary tract infection.  Was being taken back to SNF by [facility] patient became short of breath gasping for air.  [facility] then called EMS and patient was transported back to the emergency department.  Planes of mild shortness of breath but denies chest pain.  History of mental disability so history is limited due to this.  Level 5 caveat. Past Medical History:  Diagnosis Date  . Abscess of anal and rectal regions   . Crohn's disease ([place])   . DVT (deep venous thrombosis) ([place])   . Epilepsy ([place])    "petit mal; only at home" (6/9//2015)  . Heart murmur   . History of blood transfusion ~ 1992   "related to my Crohn's"  . History of stomach ulcers   . Hyperplastic colon polyp   . Iron deficiency anemia, unspecified   . Mental disability   . Other B-complex deficiencies   . REGIONAL ENTERITIS, LARGE INTESTINE 10/05/2000   Qualifier: Diagnosis of  By: Jerral Ralph    . S/P partial colectomy for Crohn's disease 12/01/2015  . SBO (small bowel obstruction) ([place]) 12/01/2015  . Syncope 03/31/2014  . Syncope and collapse 03/31/2014   "while driving"    Patient Active Problem List   Diagnosis Date Noted  . Diastolic CHF, chronic ([place]) 04/20/2018  . Catheter-associated urinary tract infection ([place]) 04/19/2018  . Mental disability   . S/P colostomy takedown 08/01/2016  . Crohn's disease of both small and large intestine with fistula ([place])   . Sepsis ([place]) 12/01/2015  . Localization-related idiopathic epilepsy and epileptic syndromes with seizures of localized onset, not intractable, without status epilepticus ([place]) 03/30/2015  . Aortic valve stenosis, mild 03/31/2014  . Aortic valve  regurgitation 03/31/2014  . Pseudopolyposis of colon ([place]) 01/03/2012  . Crohn's disease ([place]) 12/19/2011  . Personal history of immunosupression therapy 12/19/2011  . ECZEMA 08/21/2008  . CHOLELITHIASIS, ASYMPTOMATIC 01/29/2008  . VITAMIN B12 DEFICIENCY 01/17/2008    Past Surgical History:  Procedure Laterality Date  . COLON SURGERY     Anastomosis 45cm from anal verge c/w deswecending colon anastomosis  . COLONOSCOPY  2013, 2016  . COLOSTOMY TAKEDOWN N/A 08/01/2016   Procedure: LAPAROSCOPIC COLOSTOMY REVERSAL;  Surgeon: Alphonsa Overall, MD;  Location: [facility];  Service: General;  Laterality: N/A;  . [place]   ileocectomy   . HERNIA REPAIR    . LAPAROSCOPY N/A 12/04/2015   Procedure: LAPAROSCOPY DIAGNOSTIC, LAPAROSCOPIC INTEROLYSIS OF ADHESIONS, HAND- ASSISTED SMALL BOWEL RESECTION, MOBILIZATION OF SPLENIC FLEXURE, LEFT COLECTOMY WITH CREATION OF COLOSTOMY (HARTMAN'S PROCEDURE);  Surgeon: Alphonsa Overall, MD;  Location: [facility];  Service: General;  Laterality: N/A;  . PILONIDAL CYST EXCISION  1980's  . TONSILLECTOMY  ~ 1967  . UMBILICAL HERNIA REPAIR  ~ 1983        Home Medications    Prior to Admission medications   Medication Sig Start Date End Date Taking? Authorizing Provider  acetaminophen (TYLENOL) 325 MG tablet Take 325 mg by mouth every 6 (six) hours as needed for moderate pain.    Yes [provider]  acetaminophen (TYLENOL) 500 MG tablet Take 500 mg by mouth at bedtime.  Yes [provider]  Adalimumab (HUMIRA) 40 MG/0.4ML PSKT Inject 40 mg into the skin every 14 (fourteen) days. 11/16/17  Yes Pyrtle, Lajuan Lines, MD  aspirin 81 MG chewable tablet Chew 81 mg by mouth daily.    Yes [provider]  cephALEXin (KEFLEX) 500 MG capsule Take 1 capsule (500 mg total) by mouth every 8 (eight) hours. 04/23/18  Yes Debbe Odea, MD  cyanocobalamin (,VITAMIN B-12,) 1000 MCG/ML injection Inject 1 mL (1,000 mcg total) into the muscle every 30 (thirty)  days. 02/08/16  Yes Pyrtle, Lajuan Lines, MD  diphenhydrAMINE (BENADRYL) 25 mg capsule Take 25 mg by mouth every 4 (four) hours as needed (for allergic reaction).   Yes [provider]  furosemide (LASIX) 20 MG tablet Take 1 tablet (20 mg total) by mouth daily as needed for fluid or edema. 04/23/18  Yes Debbe Odea, MD  guaifenesin (ROBITUSSIN) 100 MG/5ML syrup Take 200 mg by mouth every 6 (six) hours as needed for cough (Do not exceed 4 doses in 24 hours. If cough has not improved in 24 hours notify physician.).   Yes [provider]  ipratropium (ATROVENT) 0.03 % nasal spray Place 1 spray into both nostrils 2 (two) times daily.  10/22/16  Yes [provider]  levETIRAcetam (KEPPRA) 500 MG tablet Take 1 tablet (500 mg total) by mouth 2 (two) times daily. 12/05/17  Yes Cameron Sprang, MD  loratadine (CLARITIN) 10 MG tablet Take 10 mg by mouth daily.   Yes [provider]  meclizine (ANTIVERT) 25 MG tablet Take 25 mg by mouth every 6 (six) hours as needed for dizziness.  11/07/16  Yes [provider]  mesalamine (LIALDA) 1.2 g EC tablet Take 2 tablets (2.4 g total) by mouth daily with breakfast. 10/09/17  Yes Pyrtle, Lajuan Lines, MD  Multiple Vitamins-Iron (MULTIVITAMINS WITH IRON) TABS tablet Take 1 tablet by mouth daily.   Yes [provider]  ondansetron (ZOFRAN) 4 MG tablet Take 4 mg by mouth every 6 (six) hours as needed for nausea or vomiting.   Yes [provider]  PAZEO 0.7 % SOLN Place 1 drop into both eyes as needed (irritation).  04/17/18  Yes [provider]  potassium chloride 20 MEQ/15ML (10%) SOLN Take 20 mEq by mouth daily. Dilute with 4oz of water   Yes [provider]  PRESCRIPTION MEDICATION TED hose Apply every morning and remove at bedtime as tolerated For blood clot ([place])   Yes [provider]  primidone (MYSOLINE) 250 MG tablet Take 2 tablets (500 mg total) by mouth 2 (two) times daily. 12/05/17  Yes  Cameron Sprang, MD  tamsulosin (FLOMAX) 0.4 MG CAPS capsule Take 1 capsule (0.4 mg total) by mouth daily. 04/12/18  Yes Pattricia Boss, MD  Vitamin D, Ergocalciferol, (DRISDOL) 50000 units CAPS capsule Take 50,000 Units by mouth every 7 (seven) days. On Tuesday   Yes [provider]  zinc oxide (BALMEX) 11.3 % CREA cream Apply 1 application topically 2 (two) times daily as needed (irritation). Apply to rectum   Yes [provider]    Family History Family History  Problem Relation Age of Onset  . Skin cancer Father   . Colon cancer Mother   . Seizures Neg Hx   . Crohn's disease Neg Hx     Social History Social History   Tobacco Use  . Smoking status: Never Smoker  . Smokeless tobacco: Never Used  Substance Use Topics  . Alcohol use:  No    Alcohol/week: 0.0 oz  . Drug use: No     Allergies   Darvon [propoxyphene]   Review of Systems Review of Systems  Unable to perform ROS: Dementia  Respiratory: Positive for shortness of breath.      Physical Exam Updated Vital Signs BP (!) 136/55 (BP Location: Left Arm)   Pulse 72   Temp 98.3 F (36.8 C) (Oral)   Resp 17   Wt 104.3 kg (230 lb)   SpO2 98%   BMI 33.00 kg/m   Physical Exam  Constitutional: He appears well-developed and well-nourished. No distress.  HENT:  Head: Normocephalic and atraumatic.  Dry mucous membranes  Eyes: Pupils are equal, round, and reactive to light. EOM are normal.  Neck: Normal range of motion. Neck supple. No JVD present.  Cardiovascular: Normal rate and regular rhythm. Exam reveals no gallop and no friction rub.  No murmur heard. Pulmonary/Chest: Effort normal and breath sounds normal. No stridor. No respiratory distress. He has no wheezes. He has no rales. He exhibits no tenderness.  Abdominal: Soft. Bowel sounds are normal. There is tenderness. There is no rebound and no guarding.  Patient has left lower quadrant tenderness to palpation.  No rebound or guarding.    Musculoskeletal: Normal range of motion. He exhibits no edema or tenderness.  2+ bilateral lower extremity pitting edema.  Lymphadenopathy:    He has no cervical adenopathy.  Neurological: He is alert.  Awake.  Following simple commands.  Moves all extremities without focal deficit.  Sensation intact.  Skin: Skin is warm and dry. Capillary refill takes less than 2 seconds. No rash noted. He is not diaphoretic. No erythema.  Psychiatric: He has a normal mood and affect. His behavior is normal.  Nursing note and vitals reviewed.    ED Treatments / Results  Labs (all labs ordered are listed, but only abnormal results are displayed) Labs Reviewed  CBC WITH DIFFERENTIAL/PLATELET - Abnormal; Notable for the following components:      Result Value   RBC 3.28 (*)    Hemoglobin 10.1 (*)    HCT 31.8 (*)    RDW 17.1 (*)    All other components within normal limits  COMPREHENSIVE METABOLIC PANEL - Abnormal; Notable for the following components:   Sodium 148 (*)    Potassium 3.3 (*)    Chloride 120 (*)    Glucose, Bld 107 (*)    Calcium 8.7 (*)    Albumin 2.2 (*)    Alkaline Phosphatase 128 (*)    Anion gap 4 (*)    All other components within normal limits  BRAIN NATRIURETIC PEPTIDE - Abnormal; Notable for the following components:   B Natriuretic Peptide 134.4 (*)    All other components within normal limits  URINALYSIS, ROUTINE W REFLEX MICROSCOPIC - Abnormal; Notable for the following components:   APPearance HAZY (*)    Hgb urine dipstick SMALL (*)    Protein, ur 100 (*)    Leukocytes, UA MODERATE (*)    WBC, UA >50 (*)    Bacteria, UA RARE (*)    Non Squamous Epithelial 0-5 (*)    All other components within normal limits  TROPONIN I    EKG EKG Interpretation  Date/Time:  Tuesday April 23 2018 16:59:49 EDT Ventricular Rate:  70 PR Interval:    QRS Duration: 105 QT Interval:  451 QTC Calculation: 487 R Axis:   31 Text Interpretation:  Sinus rhythm Borderline  prolonged QT interval Confirmed  by Julianne Rice [phone]) on 04/23/2018 5:21:21 PM   Radiology Dg Chest 2 View  Result Date: 04/23/2018 CLINICAL DATA:  Acute shortness of breath. EXAM: CHEST - 2 VIEW COMPARISON:  04/19/2018 and prior exams FINDINGS: Cardiomegaly noted in this low volume film. Mild bibasilar atelectasis noted. There is no evidence of focal airspace disease, pulmonary edema, suspicious pulmonary nodule/mass, pleural effusion, or pneumothorax. No acute bony abnormalities are identified. IMPRESSION: Low volume film with bibasilar atelectasis. Cardiomegaly Electronically Signed   By: Margarette Canada M.D.   On: 04/23/2018 17:41   Ct Abdomen Pelvis W Contrast  Result Date: 04/23/2018 CLINICAL DATA:  Acute generalized abdominal pain, fever, shortness of breath, chest pain, history of Crohn's disease of the colon with partial colectomy, small-bowel obstruction EXAM: CT ABDOMEN AND PELVIS WITH CONTRAST TECHNIQUE: Multidetector CT imaging of the abdomen and pelvis was performed using the standard protocol following bolus administration of intravenous contrast. Sagittal and coronal MPR images reconstructed from axial data set. CONTRAST:  136m ISOVUE-300 IOPAMIDOL (ISOVUE-300) INJECTION 61% IV. No oral contrast. COMPARISON:  12/01/2015 FINDINGS: Lower chest: Bibasilar atelectasis and tiny LEFT pleural effusion. Hepatobiliary: Calcified gallstone in gallbladder with mild gallbladder wall thickening and minimal pericholecystic infiltration question acute cholecystitis. Liver unremarkable. Pancreas: Normal appearance Spleen: Normal appearance Adrenals/Urinary Tract: Adrenal glands, kidneys, ureters, and bladder normal appearance Stomach/Bowel: Dense retained contrast with associated artifacts in the colon from speech swallowing study of 1 day prior. Colon decompressed with evidence of a prior RIGHT hemicolectomy. Bowel loops grossly unremarkable. Stomach normal appearance. Vascular/Lymphatic: Minimal  atherosclerotic calcification aorta. Aorta normal caliber. No adenopathy. Reproductive: Prostate gland normal size. Other: No free air or free fluid. Ventral surgical scar. No hernia. Musculoskeletal: Osseous demineralization. Scattered endplate spur formation thoracolumbar spine. IMPRESSION: Bibasilar atelectasis and tiny LEFT pleural effusion. Cholelithiasis with gallbladder wall thickening and pericholecystic infiltration highly suspicious for acute cholecystitis; recommend ultrasound assessment. Prior RIGHT hemicolectomy. Electronically Signed   By: MLavonia DanaM.D.   On: 04/23/2018 21:15   UKoreaAbdomen Limited  Result Date: 04/23/2018 CLINICAL DATA:  Abnormal CT right upper quadrant pain EXAM: ULTRASOUND ABDOMEN LIMITED RIGHT UPPER QUADRANT COMPARISON:  CT 04/23/2018 FINDINGS: Gallbladder: Multiple stones within the gallbladder measuring up to 7 mm. Negative sonographic Murphy. Increased wall thickness at 6.4 mm. Common bile duct: Diameter: 2.4 mm Liver: Increased echogenicity. Portal vein is patent on color Doppler imaging with normal direction of blood flow towards the liver. Trace ascites in the upper abdomen IMPRESSION: 1. Multiple gallstones with increased wall thickness but negative sonographic Murphy. If acute cholecystitis remains a concern, suggest correlation with HIDA scan. 2. Increased hepatic echogenicity suggesting steatosis. Small amount of ascites adjacent to the liver. Electronically Signed   By: KDonavan FoilM.D.   On: 04/23/2018 22:19   Dg Swallowing Func-speech Pathology  Result Date: 04/22/2018 Objective Swallowing Evaluation: Type of Study: Bedside Swallow Evaluation  Patient Details Name: Jonathan POTTINGERMRN: [id number] of Birth: 809-Feb-1957Today's Date: 04/22/2018 Time: SLP Start Time (ACUTE ONLY): 1451 -SLP Stop Time (ACUTE ONLY): 1520 SLP Time Calculation (min) (ACUTE ONLY): 29 min Past Medical History: Past Medical History: Diagnosis Date . Abscess of anal and rectal regions  .  Crohn's disease ([place]  . D[place] ([place]) ([place]  . [place]ilepsy ([place]   "petit mal; only at home" (6/9//2015) . Heart murmur  . History of blood transfusion ~ 1992  "related to my Crohn's" . History of stomach ulcers  . Hyperplastic colon polyp  . Iron deficiency anemia,  unspecified  . Mental disability  . Other B-complex deficiencies  . REGIONAL ENTERITIS, LARGE INTESTINE 10/05/2000  Qualifier: Diagnosis of  By: Jerral Ralph   . S/P partial colectomy for Crohn's disease 12/01/2015 . SBO (small bowel obstruction) ([place]) 12/01/2015 . Syncope 03/31/2014 . Syncope and collapse 03/31/2014  "while driving" Past Surgical History: Past Surgical History: Procedure Laterality Date . COLON SURGERY    Anastomosis 45cm from anal verge c/w deswecending colon anastomosis . COLONOSCOPY  2013, 2016 . COLOSTOMY TAKEDOWN N/A 08/01/2016  Procedure: LAPAROSCOPIC COLOSTOMY REVERSAL;  Surgeon: Alphonsa Overall, MD;  Location: [facility];  Service: General;  Laterality: N/A; . [place]  ileocectomy  . HERNIA REPAIR   . LAPAROSCOPY N/A 12/04/2015  Procedure: LAPAROSCOPY DIAGNOSTIC, LAPAROSCOPIC INTEROLYSIS OF ADHESIONS, HAND- ASSISTED SMALL BOWEL RESECTION, MOBILIZATION OF SPLENIC FLEXURE, LEFT COLECTOMY WITH CREATION OF COLOSTOMY (HARTMAN'S PROCEDURE);  Surgeon: Alphonsa Overall, MD;  Location: [facility];  Service: General;  Laterality: N/A; . PILONIDAL CYST EXCISION  1980's . TONSILLECTOMY  ~ 1967 . UMBILICAL HERNIA REPAIR  ~ 1983 HPI: [name]  a 62 y.o. male who resides in a SNF with medical history significant of low IQ, Crohn's disease on Humira, BPH with indwelling foley placed on 6/21 in ED. Foley changed at urology office.  Admitted with UTI.  CXR -04/19/18  No active disease.  Stable mild cardiomegaly  Subjective: pt awake in chair Assessment / Plan / Recommendation CHL IP CLINICAL IMPRESSIONS 04/22/2018 Clinical Impression Pt present with swallow ability largely consistent with findings from prior MBS3/28/2017.  Oral  swallow is functional, pharyngeal and cervical esophageal deficits noted with sensorimotor and mechanical deficits.  Pt appears with fused cervical spine that narrows pharyngeal space and prevent adequate epiglottic deflection.  In addition, laryngeal elevation/closure impaired which allows consistent laryngeal penetration of thin/nectar that largely clears with swallows.  Trace aspiration of thin noted which pt cleared with reflexive throat clearing.  Increased visocity results in more pharyngeal residuals - most notably with solids - puree/cracker.  Liquid swallows helpful to decrease solid residuals and dry swallows x3-4 helpful to decrease liquid residuals in pharynx.  Pt with chronic dysphagia and likely episodically aspirates.  However he has been tolerating po as his CXRs are repeatedly negative and he appears to be well nourished.  Using monitor, educated pt to findings and reinforced effective compensation strategies.  Chin tuck and head turn NOT HELPFUL~   SLP will follow up and educate family/pt to reinforce effective compensations.  Thanks for this referral.   SLP Visit Diagnosis Dysphagia, pharyngoesophageal phase (R13.14) Attention and concentration deficit following -- Frontal lobe and executive function deficit following -- Impact on safety and function Moderate aspiration risk   CHL IP TREATMENT RECOMMENDATION 04/22/2018 Treatment Recommendations Therapy as outlined in treatment plan below   Prognosis 04/22/2018 Prognosis for Safe Diet Advancement Fair Barriers to Reach Goals Time post onset Barriers/Prognosis Comment -- CHL IP DIET RECOMMENDATION 04/22/2018 SLP Diet Recommendations Dysphagia 3 (Mech soft) solids;Thin liquid Liquid Administration via Cup;Straw Medication Administration Crushed with puree Compensations Small sips/bites;Slow rate;Follow solids with liquid Postural Changes Seated upright at 90 degrees;Remain semi-upright after after feeds/meals (Comment)   CHL IP OTHER RECOMMENDATIONS  04/22/2018 Recommended Consults -- Oral Care Recommendations Oral care BID Other Recommendations --   CHL IP FOLLOW UP RECOMMENDATIONS 04/22/2018 Follow up Recommendations (No Data)   CHL IP FREQUENCY AND DURATION 04/22/2018 Speech Therapy Frequency (ACUTE ONLY) min 2x/week Treatment Duration 1 week      CHL IP ORAL PHASE  04/22/2018 Oral Phase WFL Oral - Pudding Teaspoon -- Oral - Pudding Cup -- Oral - Honey Teaspoon -- Oral - Honey Cup -- Oral - Nectar Teaspoon -- Oral - Nectar Cup -- Oral - Nectar Straw -- Oral - Thin Teaspoon -- Oral - Thin Cup -- Oral - Thin Straw -- Oral - Puree -- Oral - Mech Soft -- Oral - Regular -- Oral - Multi-Consistency -- Oral - Pill -- Oral Phase - Comment --  CHL IP PHARYNGEAL PHASE 04/22/2018 Pharyngeal Phase Impaired Pharyngeal- Pudding Teaspoon -- Pharyngeal -- Pharyngeal- Pudding Cup -- Pharyngeal -- Pharyngeal- Honey Teaspoon -- Pharyngeal -- Pharyngeal- Honey Cup -- Pharyngeal -- Pharyngeal- Nectar Teaspoon Reduced airway/laryngeal closure;Reduced tongue base retraction;Reduced epiglottic inversion;Pharyngeal residue - valleculae;Pharyngeal residue - pyriform;Penetration/Aspiration during swallow Pharyngeal Material enters airway, remains ABOVE vocal cords then ejected out Pharyngeal- Nectar Cup -- Pharyngeal -- Pharyngeal- Nectar Straw Reduced epiglottic inversion;Reduced laryngeal elevation;Reduced airway/laryngeal closure;Reduced tongue base retraction;Pharyngeal residue - valleculae;Pharyngeal residue - pyriform Pharyngeal -- Pharyngeal- Thin Teaspoon Reduced epiglottic inversion;Reduced airway/laryngeal closure;Reduced tongue base retraction;Reduced laryngeal elevation;Reduced anterior laryngeal mobility;Penetration/Aspiration during swallow Pharyngeal Material enters airway, remains ABOVE vocal cords and not ejected out Pharyngeal- Thin Cup Reduced epiglottic inversion;Reduced laryngeal elevation;Reduced airway/laryngeal closure;Reduced tongue base retraction;Pharyngeal residue -  valleculae;Pharyngeal residue - pyriform;Penetration/Aspiration during swallow Pharyngeal Material enters airway, remains ABOVE vocal cords and not ejected out Pharyngeal- Thin Straw Reduced airway/laryngeal closure;Reduced tongue base retraction;Reduced laryngeal elevation;Reduced epiglottic inversion;Penetration/Aspiration during swallow;Pharyngeal residue - valleculae;Pharyngeal residue - pyriform Pharyngeal Material enters airway, remains ABOVE vocal cords and not ejected out Pharyngeal- Puree Reduced airway/laryngeal closure;Reduced epiglottic inversion;Reduced tongue base retraction;Pharyngeal residue - valleculae;Reduced laryngeal elevation Pharyngeal -- Pharyngeal- Mechanical Soft -- Pharyngeal -- Pharyngeal- Regular Reduced tongue base retraction;Reduced epiglottic inversion;Pharyngeal residue - valleculae;Reduced laryngeal elevation;Reduced airway/laryngeal closure Pharyngeal -- Pharyngeal- Multi-consistency -- Pharyngeal -- Pharyngeal- Pill -- Pharyngeal -- Pharyngeal Comment chin tuck unable to be performed adequately - suspect due to cervical spine appearing fused, head turn right *more mobility than left* did not prevent residuals, following solids with liquids and dry swallows helpful to decrease residuals in pharynx , pt with sensory deficits to residuals  CHL IP CERVICAL ESOPHAGEAL PHASE 04/22/2018 Cervical Esophageal Phase Impaired Pudding Teaspoon -- Pudding Cup -- Honey Teaspoon -- Honey Cup -- Nectar Teaspoon -- Nectar Cup -- Nectar Straw -- Thin Teaspoon -- Thin Cup -- Thin Straw -- Puree -- Mechanical Soft -- Regular -- Multi-consistency -- Pill -- Cervical Esophageal Comment decreased UES clearance, suspect mechanical and motor component resulting in residuals of liquids more than solids Macario Golds 04/22/2018, 4:04 PM  Luanna Salk, MS [facility] SLP [phone]              Procedures Procedures (including critical care time)  Medications Ordered in ED Medications  iopamidol  (ISOVUE-300) 61 % injection (has no administration in time range)  iopamidol (ISOVUE-300) 61 % injection 100 mL (100 mLs Intravenous Contrast Given 04/23/18 2037)     Initial Impression / Assessment and Plan / ED Course  I have reviewed the triage vital signs and the nursing notes.  Pertinent labs & imaging results that were available during my care of the patient were reviewed by me and considered in my medical decision making (see chart for details).    Cholelithiasis with mild pericholecystic fluid on Ultrasound with cholelithiasis but negative Murphy sign.  Patient has a normal white blood cell count and LFTs.  Discussed with general surgery.  Fluid around the gallbladder likely related to his low albumin  and CHF.  We will follow-up with general surgery as an outpatient.  Patient is in no respiratory distress.  Maintaining oxygen saturations in the mid 90s on room air.  Discussed with patient's power of attorney Lamont Snowball.  Agrees to plan to discharge back to nursing home.   Final Clinical Impressions(s) / ED Diagnoses   Final diagnoses:  Calculus of gallbladder without cholecystitis without obstruction  Dyspnea, unspecified type    ED Discharge Orders    None       Julianne Rice, MD 04/23/18 2309

## 2018-04-23 NOTE — ED Notes (Signed)
Patient transported to X-ray 

## 2018-04-23 NOTE — ED Notes (Signed)
No urine noted in cath at this time

## 2018-04-23 NOTE — ED Triage Notes (Signed)
Pt arrives via EMS from [place]. Pt was discharged from [facility] today. Pt was headed back to facility with [facility] transport and Per [facility]: "Pt complained of Shortness of breath and then had a gasp of air" Pt never arrived to facility [facility] called EMS. Per EMS: pts sat have been 95% on RA. Pt has delay per baseline. Pt endorses chest pain and shortness of breath at this time.

## 2018-04-23 NOTE — Discharge Summary (Signed)
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 in the last 168 hours. CBC: Recent Labs  Lab 04/19/18 1707 04/20/18 0517 04/22/18 0617  WBC 15.5* 12.4* 7.3  NEUTROABS 13.0*  --   --   HGB 12.0* 10.7* 10.9*  HCT 37.0* 33.1* 34.6*  MCV 94.9 95.1 98.0  PLT 331 271 260   Cardiac Enzymes: No results for input(s): CKTOTAL, CKMB, CKMBINDEX, TROPONINI in the last 168 hours. BNP: Invalid input(s): POCBNP CBG: No results for input(s): GLUCAP in the last 168 hours. D-Dimer No results for input(s): DDIMER in the last 72 hours. Hgb A1c No results for input(s): HGBA1C in the last 72 hours. Lipid Profile No results for input(s): CHOL, HDL, LDLCALC, TRIG, CHOLHDL, LDLDIRECT in the last 72 hours. Thyroid function studies No results for input(s): TSH, T4TOTAL, T3FREE, THYROIDAB in the last 72 hours.  Invalid input(s): FREET3 Anemia work up No results for input(s): VITAMINB12, FOLATE, FERRITIN, TIBC, IRON,  RETICCTPCT in the last 72 hours. Urinalysis    Component Value Date/Time   COLORURINE AMBER (A) 04/19/2018 1707   APPEARANCEUR CLOUDY (A) 04/19/2018 1707   LABSPEC 1.013 04/19/2018 1707   PHURINE 5.0 04/19/2018 1707   GLUCOSEU NEGATIVE 04/19/2018 1707   HGBUR LARGE (A) 04/19/2018 1707   BILIRUBINUR NEGATIVE 04/19/2018 1707   KETONESUR NEGATIVE 04/19/2018 1707   PROTEINUR 30 (A) 04/19/2018 1707   UROBILINOGEN 1.0 04/01/2014 1057   NITRITE POSITIVE (A) 04/19/2018 1707   LEUKOCYTESUR LARGE (A) 04/19/2018 1707  Sepsis Labs Invalid input(s): PROCALCITONIN,  WBC,  LACTICIDVEN Microbiology Recent Results (from the past 240 hour(s))  Blood Culture (routine x 2)     Status: None (Preliminary result)   Collection Time: 04/19/18  5:07 PM  Result Value Ref Range Status   Specimen Description   Final    BLOOD RIGHT ARM Performed at [facility], [address] [place], [place] [address]    Special Requests   Final    BOTTLES DRAWN AEROBIC AND ANAEROBIC Blood Culture adequate volume Performed at [place] [address], [place], [place] [address]    Culture   Final    NO GROWTH 3 DAYS Performed at [facility], [place] [address], [place], [place] [address]    Report Status PENDING  Incomplete  Blood Culture (routine x 2)     Status: None (Preliminary result)   Collection Time: 04/19/18  5:07 PM  Result Value Ref Range Status   Specimen Description   Final    BLOOD RIGHT ANTECUBITAL Performed at [place] [address], [place], [place] [address]    Special Requests   Final    BOTTLES DRAWN AEROBIC AND ANAEROBIC Blood Culture adequate volume Performed at [place] [address] [place], [place] [address]    Culture   Final    NO GROWTH 3 DAYS Performed at [facility], [place] [address] [place], [place] [address]    Report Status PENDING  Incomplete  Urine culture     Status: Abnormal    Collection Time: 04/19/18  5:18 PM  Result Value Ref Range Status   Specimen Description   Final    URINE, RANDOM Performed at [place] [address], [place], [place] [address]    Special Requests   Final    NONE Performed at [facility], [place] [address], [place], [place] [address]    Culture >=100,000 COLONIES/mL KLEBSIELLA PNEUMONIAE (A)  Final   Report Status 04/23/2018 FINAL  Final   Organism ID, Bacteria KLEBSIELLA PNEUMONIAE (A)  Final      Susceptibility   Klebsiella pneumoniae - MIC*    AMPICILLIN >=32 RESISTANT Resistant     CEFAZOLIN <=4 SENSITIVE Sensitive     CEFTRIAXONE <=1 SENSITIVE Sensitive     CIPROFLOXACIN <=0.25 SENSITIVE Sensitive     GENTAMICIN <=1 SENSITIVE Sensitive     IMIPENEM <=0.25 SENSITIVE Sensitive     NITROFURANTOIN 32 SENSITIVE Sensitive     TRIMETH/SULFA <=20 SENSITIVE Sensitive     AMPICILLIN/SULBACTAM <=2 SENSITIVE Sensitive     PIP/TAZO <=4 SENSITIVE Sensitive     Extended ESBL NEGATIVE Sensitive     * >=100,000 COLONIES/mL KLEBSIELLA PNEUMONIAE  MRSA PCR Screening     Status: None   Collection Time: 04/20/18  2:51 AM  Result Value Ref Range Status   MRSA by PCR NEGATIVE NEGATIVE Final    Comment:        The GeneXpert MRSA Assay (FDA approved for NASAL specimens only), is one component of a comprehensive MRSA colonization surveillance program. It is not intended to diagnose MRSA infection nor to guide or monitor treatment for MRSA infections. Performed at [facility], [place] [address], [place], [place] [address]      Time coordinating discharge in minutes: 41  SIGNED:   Debbe Odea, MD  [facility] 04/23/2018, 11:25 AM Pager   If 7PM-7AM, please contact night-coverage [email or web address] Password [email or web address]

## 2018-04-23 NOTE — ED Notes (Signed)
MD at bedside. 

## 2018-04-23 NOTE — Progress Notes (Signed)
Report called to [place] at [facility].

## 2018-04-23 NOTE — ED Notes (Signed)
[facility] called for transport back to nursing home.

## 2018-04-23 NOTE — Clinical Social Work Placement (Addendum)
    1:33 PM Patient and family chose bed at [facility].   LCSW confirmed bed with facility.   LCSW faxed dc docs to facility.   Patient will transport by PTAR.   RN report number: [phone]  RN please notify sister when patient is picked up.   Carolin Coy [facility] CSW [phone]   CLINICAL SOCIAL WORK PLACEMENT  NOTE  Date:  04/23/2018  Patient Details  Name: [name] MRN: [id number] Date of Birth: 11-18-1955  Clinical Social Work is seeking post-discharge placement for this patient at the [place] level of care (*CSW will initial, date and re-position this form in  chart as items are completed):  Yes   Patient/family provided with [facility] Work Department's list of facilities offering this level of care within the geographic area requested by the patient (or if unable, by the patient's family).  Yes   Patient/family informed of their freedom to choose among providers that offer the needed level of care, that participate in Medicare, Medicaid or managed care program needed by the patient, have an available bed and are willing to accept the patient.  Yes   Patient/family informed of [facility]'s ownership interest in [facility] and [facility], as well as of the fact that they are under no obligation to receive care at these facilities.  PASRR submitted to EDS on       PASRR number received on 04/23/18     Existing PASRR number confirmed on       FL2 transmitted to all facilities in geographic area requested by pt/family on       FL2 transmitted to all facilities within larger geographic area on       Patient informed that his/her managed care company has contracts with or will negotiate with certain facilities, including the following:        Yes   Patient/family informed of bed offers received.  Patient chooses bed at [place] [place]     Physician recommends and patient chooses bed at       Patient to be transferred to [place] [place] on 04/23/18.  Patient to be transferred to facility by EMS     Patient family notified on 04/23/18 of transfer.  Name of family member notified:  [facility]     PHYSICIAN       Additional Comment:    _______________________________________________ Servando Snare, LCSW 04/23/2018, 1:33 PM

## 2018-04-23 NOTE — Progress Notes (Signed)
Patient has bed at [facility] at [place].   LCSW will continue to follow for dc needs.   Carolin Coy [place] [facility] [place]

## 2018-04-23 NOTE — ED Notes (Signed)
Bed: JY11 Expected date:  Expected time:  Means of arrival:  Comments: EMS-[facility]

## 2018-04-24 ENCOUNTER — Encounter: Payer: Self-pay | Admitting: Internal Medicine

## 2018-04-24 ENCOUNTER — Encounter: Payer: Self-pay | Admitting: *Deleted

## 2018-04-24 LAB — CULTURE, BLOOD (ROUTINE X 2)
CULTURE: NO GROWTH
CULTURE: NO GROWTH
SPECIAL REQUESTS: ADEQUATE
Special Requests: ADEQUATE

## 2018-04-29 ENCOUNTER — Encounter: Payer: Self-pay | Admitting: Internal Medicine

## 2018-05-13 ENCOUNTER — Encounter: Payer: Self-pay | Admitting: *Deleted

## 2018-05-14 ENCOUNTER — Telehealth: Payer: Self-pay | Admitting: Neurology

## 2018-05-14 ENCOUNTER — Telehealth: Payer: Self-pay | Admitting: Internal Medicine

## 2018-05-14 DIAGNOSIS — G40009 Localization-related (focal) (partial) idiopathic epilepsy and epileptic syndromes with seizures of localized onset, not intractable, without status epilepticus: Secondary | ICD-10-CM

## 2018-05-14 MED ORDER — PRIMIDONE 250 MG PO TABS: ORAL_TABLET | ORAL | 3 refills | Status: DC

## 2018-05-14 NOTE — Telephone Encounter (Signed)
Patient's sister called to let Dr. Delice Lesch know that his PCP had labs taken on this patient and his Liver Enzyme Levels have come back very Elevated. They feel it could be due to his Seizure medication? Please Call. Thanks

## 2018-05-14 NOTE — Telephone Encounter (Signed)
Let [name] know that the labs did come in along with an [place] and they are in Dr. Vena Rua inbox, pt has appt Monday and all will be addressed at that visit.

## 2018-05-14 NOTE — Telephone Encounter (Signed)
Pls let her know that I don't think it's the seizure medication, would make sure they rule out other causes of elevated liver enzymes, but if he is not having any seizures, we can also reduce the Primidone 240m: Take 1 tab in AM, 2 tabs in PM and see how he does. If they are agreeable, pls send order to [facility]. thanks

## 2018-05-14 NOTE — Telephone Encounter (Signed)
Informed patients sister of recommendation.  She denies patient having any seizures.  Primidone reduced per Dr Delice Lesch note.  [facility] informed.

## 2018-05-15 ENCOUNTER — Telehealth: Payer: Self-pay | Admitting: Neurology

## 2018-05-15 NOTE — Telephone Encounter (Signed)
[name] states that she needs to talk to someone about the change in the orders on the Primidone.

## 2018-05-15 NOTE — Telephone Encounter (Signed)
Spoke with [name] and clarified new primidone order.

## 2018-05-20 ENCOUNTER — Encounter: Payer: Self-pay | Admitting: Internal Medicine

## 2018-05-20 ENCOUNTER — Ambulatory Visit (INDEPENDENT_AMBULATORY_CARE_PROVIDER_SITE_OTHER): Payer: Medicare Other | Admitting: Internal Medicine

## 2018-05-20 VITALS — BP 110/50 | HR 68

## 2018-05-20 DIAGNOSIS — Z8639 Personal history of other endocrine, nutritional and metabolic disease: Secondary | ICD-10-CM

## 2018-05-20 DIAGNOSIS — E538 Deficiency of other specified B group vitamins: Secondary | ICD-10-CM | POA: Diagnosis not present

## 2018-05-20 DIAGNOSIS — K50813 Crohn's disease of both small and large intestine with fistula: Secondary | ICD-10-CM

## 2018-05-20 DIAGNOSIS — K829 Disease of gallbladder, unspecified: Secondary | ICD-10-CM | POA: Diagnosis not present

## 2018-05-20 DIAGNOSIS — M450 Ankylosing spondylitis of multiple sites in spine: Secondary | ICD-10-CM

## 2018-05-20 NOTE — Progress Notes (Signed)
Subjective:    Patient ID: [name], male    DOB: [birth year], 62 y.o.   MRN: [id number]  HPI [name] is a 62 year old male with a history of ileocolonic Crohn's disease diagnosed in 89 status post right hemicolectomy with known history of fistulizing and stricturing Crohn's disease, history of LOA and left hemicolectomy, history of iron deficiency anemia, history of DVT, ankylosing spondylitis and recent history of cholecystitis who is here for follow-up.  He is here today with his cousin and her husband.  His Sister [name] is his medical power of attorney, her phone number is [phone].  He was last seen on 02/07/2018.  Since his last visit he has moved from [facility] living facility to [facility].  It appears that simply he was not getting adequate care at his former residence.  Unfortunately he was missing his doses of Humira on a regular basis.  It seems that he went at different x12 days and 11 days overdue for Humira.  His sister reviewed medical insurance records and it appears Humira was not given at all in the month of May 2019.  Being off of this medication he had return of his diarrhea and he was also being underdosed on his Imodium.  He was finding it difficult to go to the dining room because he was concerned about having urgent diarrhea.  With his limited mobility and trying to use the bathroom urgently he was having multiple falls.  He denies abdominal pain and has not had blood in his stool or melena.  Most recently he has been back on Humira on a regular basis his last dose was 12 days ago and he is due for repeat dose on 05/22/2018.  It appears he has continued on Lialda at 2.4 g daily.  Unfortunately he was hospitalized from 04/19/2018 to 04/23/2018 for a catheter associated UTI.  He is now on Flomax and his Foley has been removed.  He is now urinating without difficulty.  Just after leaving the hospital he developed upper abdominal and lower chest pain which led to  a CT scan and ultrasound of his abdomen.  Cholelithiasis with gallbladder wall thickening and pericholecystic fluid suspicious for acute cholecystitis was found.  Multiple gallstones were seen.  There was a negative Murphy sign.  He is followed up with Dr. Alphonsa Overall most recently on 05/17/2018.  I have reviewed this note.  Dr. Lucia Gaskins is recommending cholecystectomy but this is not yet scheduled.  Dr. Lucia Gaskins was asking for guidance regarding his Humira.   Review of Systems As per HPI, otherwise negative  Current Medications, Allergies, Past Medical History, Past Surgical History, Family History and Social History were reviewed in [facility] record.     Objective:   Physical Exam BP (!) 110/50   Pulse 68  Constitutional: Well-developed and well-nourished. No distress. HEENT: Normocephalic and atraumatic. Conjunctivae are normal.  No scleral icterus. Neck: Neck supple. Trachea midline. Cardiovascular: Normal rate, regular rhythm and intact distal pulses.  Pulmonary/chest: Effort normal and breath sounds normal. No wheezing, rales or rhonchi. Abdominal: Soft, nontender, nondistended. Bowel sounds active throughout.  Extremities: no clubbing, cyanosis, trace b/l LE, R>L, edema Neurological: Alert and oriented to person place and time. Skin: Skin is warm and dry. Psychiatric: Normal mood and affect. Behavior is normal.  ULTRASOUND ABDOMEN LIMITED RIGHT UPPER QUADRANT   COMPARISON:  CT 04/23/2018   FINDINGS: Gallbladder:   Multiple stones within the gallbladder measuring up to 7 mm. Negative  sonographic Percell Miller. Increased wall thickness at 6.4 mm.   Common bile duct:   Diameter: 2.4 mm   Liver:   Increased echogenicity. Portal vein is patent on color Doppler imaging with normal direction of blood flow towards the liver. Trace ascites in the upper abdomen   IMPRESSION: 1. Multiple gallstones with increased wall thickness but negative sonographic  Murphy. If acute cholecystitis remains a concern, suggest correlation with HIDA scan. 2. Increased hepatic echogenicity suggesting steatosis. Small amount of ascites adjacent to the liver.     Electronically Signed   By: Donavan Foil M.D.   On: 04/23/2018 22:19   CT ABDOMEN AND PELVIS WITH CONTRAST   TECHNIQUE: Multidetector CT imaging of the abdomen and pelvis was performed using the standard protocol following bolus administration of intravenous contrast. Sagittal and coronal MPR images reconstructed from axial data set.   CONTRAST:  170m ISOVUE-300 IOPAMIDOL (ISOVUE-300) INJECTION 61% IV. No oral contrast.   COMPARISON:  12/01/2015   FINDINGS: Lower chest: Bibasilar atelectasis and tiny LEFT pleural effusion.   Hepatobiliary: Calcified gallstone in gallbladder with mild gallbladder wall thickening and minimal pericholecystic infiltration question acute cholecystitis. Liver unremarkable.   Pancreas: Normal appearance   Spleen: Normal appearance   Adrenals/Urinary Tract: Adrenal glands, kidneys, ureters, and bladder normal appearance   Stomach/Bowel: Dense retained contrast with associated artifacts in the colon from speech swallowing study of 1 day prior. Colon decompressed with evidence of a prior RIGHT hemicolectomy. Bowel loops grossly unremarkable. Stomach normal appearance.   Vascular/Lymphatic: Minimal atherosclerotic calcification aorta. Aorta normal caliber. No adenopathy.   Reproductive: Prostate gland normal size.   Other: No free air or free fluid. Ventral surgical scar. No hernia.   Musculoskeletal: Osseous demineralization. Scattered endplate spur formation thoracolumbar spine.   IMPRESSION: Bibasilar atelectasis and tiny LEFT pleural effusion.   Cholelithiasis with gallbladder wall thickening and pericholecystic infiltration highly suspicious for acute cholecystitis; recommend ultrasound assessment.   Prior RIGHT hemicolectomy.       Electronically Signed   By: MLavonia DanaM.D.   On: 04/23/2018 21:15   CMP     Component Value Date/Time   NA 148 (H) 04/23/2018 1727   K 3.3 (L) 04/23/2018 1727   CL 120 (H) 04/23/2018 1727   CO2 24 04/23/2018 1727   GLUCOSE 107 (H) 04/23/2018 1727   BUN 19 04/23/2018 1727   CREATININE 1.13 04/23/2018 1727   CREATININE 0.87 10/28/2014 1533   CALCIUM 8.7 (L) 04/23/2018 1727   PROT 6.9 04/23/2018 1727   ALBUMIN 2.2 (L) 04/23/2018 1727   AST 19 04/23/2018 1727   ALT 18 04/23/2018 1727   ALKPHOS 128 (H) 04/23/2018 1727   BILITOT 0.5 04/23/2018 1727   GFRNONAA >60 04/23/2018 1727   GFRAA >60 04/23/2018 1727        Assessment & Plan:  62year old male with a history of ileocolonic Crohn's disease diagnosed in 837status post right hemicolectomy with known history of fistulizing and stricturing Crohn's disease, history of LOA and left hemicolectomy, history of iron deficiency anemia, history of DVT, ankylosing spondylitis and recent history of cholecystitis who is here for follow-up  1.  Cholelithiasis and recent mild cholecystitis now resolved --I am in agreement with plans for cholecystectomy with Dr. NLucia Gaskins  From a Crohn's perspective we would like to interrupt Humira therapy for a short duration as possible.  That said, I am okay holding it if this is what Dr. NLucia Gaskinsfeels is best.  I suggested that possibly surgery could be done  at trough (2 weeks or so after last dose), and then restarted when Dr. Lucia Gaskins feels appropriate (perhaps 1 week after surgery).  If laparoscopic approach then my concern with wound healing impairment by Humira is very low.  2.  Crohn's ileocolitis/history of fistula and stricture --unfortunately intermittent Humira dosing due to medication administration problem where he was previously living.  It sounds like he is moved to a better living facility where he will get more attention and care. --For now we will continue Humira 40 mg every 14 days, next dose is  05/22/2018. --Continue Lialda 2.4 g daily  3.  History of IDA --improved after IV iron.  Will need periodic following of CBC and iron studies  4.  B12 deficiency --continue monthly B12  5.  Ankylosing spondylitis --no active treatment other than treating problem #2.  Would refer back to Dr. Vertell Limber should he develop more significant spinal pain.  6.  History of DVT --completed therapy, off of anticoagulation now.

## 2018-05-20 NOTE — Patient Instructions (Signed)
Continue Lialda, Humira and Zantac and current dosages and intervals.  Follow up with Dr Hilarie Fredrickson in 3 months.  If you are age 64 or older, your body mass index should be between 23-30. Your There is no height or weight on file to calculate BMI. If this is out of the aforementioned range listed, please consider follow up with your Primary Care Provider.  If you are age 72 or younger, your body mass index should be between 19-25. Your There is no height or weight on file to calculate BMI. If this is out of the aformentioned range listed, please consider follow up with your Primary Care Provider.

## 2018-05-23 NOTE — Progress Notes (Signed)
Records requested from [facility] in preparation for upcoming surgery. [facility] phone [phone], fax [phone].  Coolidge Breeze, RN 05/23/2018

## 2018-05-27 ENCOUNTER — Encounter (HOSPITAL_COMMUNITY): Payer: Self-pay | Admitting: *Deleted

## 2018-05-28 NOTE — Progress Notes (Addendum)
Preop instructions for: [name]                         Date of Birth: May 17, 1956                             Date of Procedure:  06/06/2018      Doctor: Dr Alphonsa Overall  Time to arrive at [facility]: 0630 Report to: Admitting  Procedure: Laparoscopic Cholecystectomy with Intraoperative Cholangiogram  Any procedure time changes, MD office will notify you!   Do not eat or drink past midnight the night before your procedure.(To include any tube feedings-must be discontinued)     Take these morning medications only with sips of water.(or give through gastrostomy or feeding tube).  Tamsulosin, Mesalamine, Atrovent Nasal Spray, Levetiracetam, , Ranitidine ,Loratidine,Primidone      Facility contact: [place]                  Phone:  [place]: [name] [phone]  Transportation contact phone#: [facility]  Please send day of procedure:current med list and meds last taken that day, confirm nothing by mouth status from what time, Patient Demographic info( to include DNR status, problem list, allergies)   RN contact name/phone#: [facility]                              and Fax #: [phone]  [place] card and picture ID Leave all jewelry and other valuables at place where living( no metal or rings to be worn) No contact lens   Men-no colognes,lotions  Any questions day of procedure,call Short Stay [phone]    Sent from :[facility]                   [place]                   [phone]  Sent by :Jonathan Shields RN

## 2018-05-30 ENCOUNTER — Ambulatory Visit: Payer: Self-pay | Admitting: Surgery

## 2018-05-30 NOTE — Pre-Procedure Instructions (Signed)
Spoke with [name] at [facility], she verified that they did receive Mr. Chow's pre op instructions.

## 2018-06-01 ENCOUNTER — Ambulatory Visit: Payer: Self-pay | Admitting: Surgery

## 2018-06-05 NOTE — H&P (Signed)
[name]  Location: [facility] Surgery Patient #: [id number] DOB: 1956-05-06 Single / Language: [name] / Race: White Male  History of Present Illness   The patient is a 62 year old male who presents with a complaint of Follow up for colon surgery.  His PCP is - none  I think that he is seeing [name] while he is at [place].  He sees [name] for GI. He is accompanied by his cousin [name], his sister, and her husband, [name].  He was hospitalized from 04/19/2018 - 04/23/2018 for catherter associated UTI. He has been seen by [name]. He is now on Flomax, his Foley is removed, and he is urinating better. He developed chest/abdominal pain on 7/2/201 and had a CT scan and [place] of his abdomen. The CT showed Bibasilar atelectasis and tiny LEFT pleural effusion. Cholelithiasis with gallbladder wall thickening and pericholecystic infiltration highly suspicious for acute cholecystitis. The US showed 1. Multiple gallstones with increased wall thickness but negative sonographic Murphy. If acute cholecystitis remains a concern, suggest correlation with HIDA scan. 2. Increased hepatic echogenicity suggesting steatosis. Small amount of ascites adjacent to the liver. Since the evaluation early in July, he's had no more abdominal or chest pain. But according to [name], he's been very weak. He just now getting ready can stand on his own. He's also developed an ulcer on his right foot. He is taking Humira for his Crohn's disease. There has been some irregularity to this administration. The family feels more secure at [place]. He is to see [name] next week. I told them to tell [name] that we are thinking about gall bladder surgery.  He has a new diagnosis of ankylosing spondylosis - he is not seeing a rheumotoligst at this time. He has also fallen several times. The cause is uncler. They do not think that the falls are due to  seizures.  I discussed with the patient the indications and risks of gall bladder surgery. The primary risks of gall bladder surgery include, but are not limited to, bleeding, infection, common bile duct injury, and open surgery. There is also the risk that the patient may have continued symptoms after surgery. We discussed the typical post-operative recovery course. I tried to answer the patient's questions. I gave the patient literature about gall bladder surgery.  Plan: 1) I with Armari that he would be best served with proceeding with a lap chole. I don't think the HIDA scan would provide any benefit. He is at increased risk for open surgery and complications.  Past medical history: 1. Colon surgery A. He underwent a Laparoscopic enterolysis of adhesions (45 minutes), laparoscopic-assisted small bowel resection, hand assisted left hemicolectomy with mobilization of splenic flexure, and left transverse colon end colostomy with Hartmann pouch on 12/04/2015 by Dr. Lucia Gaskins for a SBO and history of Crohn's disease. He final pathology was benign. B. Colostomy reversal - 08/02/2016 - [name] 2. [name] is now at [facility] and the family is much happier. 3. History of Crohn's disease Followed by [name] On Humira 4. Seizure disorder 5. Learning challenged. 6. Idiopathic epilepsy 7. Left leg DVT post op 2017.[name] He saw [name] for vascular. He is off blood thinners except for aspirin. 8. Ankylosing spondylosis 9. Heart murmur, systolic  Social History: Unmarried. He is living in [name]. This is where his mother is. She is 67 yo. They are happier His sister, [name] ([phone]), is from [place] - she has  power of atty His cousin, [name] (H: [phone], C: [phone]), is with him. Her husband [name] is with her. They have a place in [facility] that they like to go  to. They are moving to a new house. Right now they are living in their son's basement.   Allergies ([name], CMA; 05/17/2018 4:11 PM) No Known Drug Allergies [01/10/2016]:  Medication History ([name], CMA; 05/17/2018 4:18 PM) LevETIRAcetam (500MG Tablet, Oral) Active. Ipratropium Bromide (0.03% Solution, Nasal) Active. Primidone (250MG Tablet, Oral bID) Active. Tamsulosin HCl (0.4MG Capsule, Oral) Active. Vitamin D (50000U Tablet, Oral WEekly) Active. Aspirin (81MG Tablet, Oral) Active. Claritin (10MG Tablet, Oral) Active. Mesalamine (1.2GM Tablet DR, Oral) Active. Multi-Vitamin (Oral) Active. Acetaminophen (325MG Tablet, Oral) Active. Ondansetron HCl (4MG Tablet, Oral) Active. Tussin CF Cough & Cold (5-10-100MG/5ML Liquid, Oral) Active. Meclizine HCl (25MG Tablet, Oral) Active. Pazeo (0.7% Solution, Ophthalmic) Active. Humira (40MG/0.8ML Prefill Syr Kit, Subcutaneous) Active. Potassium Chloride (20MEQ Tablet ER, Oral) Active. Cyanocobalamin (1000MCG/ML Solution, Injection 1 ML every 30 days) Active. Zofran (4MG Tablet, Oral PRN) Active. Tylenol (325MG Tablet, Oral) Active. Wound Care (External) Active. Medications Reconciled  Vitals ([name] CMA; 05/17/2018 4:18 PM) 05/17/2018 4:18 PM Weight: 219 lb Height: 70in Body Surface Area: 2.17 m Body Mass Index: 31.42 kg/m  Temp.: 97.26F(Oral)  Pulse: 68 (Regular)  BP: 120/68 (Sitting, Left Arm, Standard)   Physical Exam  General: WN older WM alert. He needs help getting on the exam table. Bald. HEENT: Normal. Pupils equal.   Neck: Supple. No mass. No thyroid mass.   Heart: RRR. 3/6 systolic murmur Lungs: Clear  Abdomen: Soft. No mass. Wound - Ostomy wound and midline wound look good. He has a bruise to the left of midline - about 8 cm in size. He does not know how he got that.  Extremities: Has non healing wound on right foot. He is in a sock.  Assessment &  Plan  1.  GALL BLADDER DISEASE (K82.9)  Plan:  1) Lap cholecystecotmy with cholangiogram   2) He is seeing [name] next week - will talk about Humira  2.  HISTORY OF COLOSTOMY  Story: Colostomy closed - 08/02/2016 - D. Royale Swamy 3.  CROHN'S DISEASE WITH FISTULA, UNSPECIFIED GASTROINTESTINAL TRACT LOCATION (K50.913)  Story: Laparoscopic enterolysis of adhesions (45 minutes), laparoscopic-assisted small bowel resection, hand assisted left hemicolectomy with mobilization of splenic flexure, and left transverse colon end colostomy with [name] pouch - 12/04/2015 - D. Jonatha Gagen  Impression: Followed by [name]  On Humira - this is on hold for 2 weeks pre op and one week post op  4.  ACUTE DEEP VEIN THROMBOSIS (DVT) OF DISTAL VEIN OF LOWER EXTREMITY, UNSPECIFIED LATERALITY (I82.4Z9)  Impression: Completed therapy  On an aspirin  5. Seizure disorder 6. Learning challenged. 7. Idiopathic epilepsy 8. Left leg DVT post op 2017.[name]  He saw [name] for vascular.  He is off blood thinners except for aspirin. 9. Ankylosing spondylosis 10. Heart murmur, systolic   Alphonsa Overall, MD, [facility] Surgery Pager: [phone] Office phone:  [phone]

## 2018-06-06 ENCOUNTER — Ambulatory Visit (HOSPITAL_COMMUNITY): Payer: Medicare Other

## 2018-06-06 ENCOUNTER — Encounter (HOSPITAL_COMMUNITY): Payer: Self-pay

## 2018-06-06 ENCOUNTER — Other Ambulatory Visit: Payer: Self-pay

## 2018-06-06 ENCOUNTER — Encounter (HOSPITAL_COMMUNITY)
Admission: RE | Disposition: A | Discharge status: Skilled Nursing Facility | Payer: Self-pay | Source: Ambulatory Visit | Attending: Surgery

## 2018-06-06 ENCOUNTER — Observation Stay (HOSPITAL_COMMUNITY)
Admission: RE | Admit: 2018-06-06 | Discharge: 2018-06-07 | Disposition: A | Discharge status: Skilled Nursing Facility | Payer: Medicare Other | Source: Ambulatory Visit | Attending: Surgery | Admitting: Surgery

## 2018-06-06 ENCOUNTER — Ambulatory Visit (HOSPITAL_COMMUNITY): Payer: Medicare Other | Admitting: Anesthesiology

## 2018-06-06 DIAGNOSIS — K50913 Crohn's disease, unspecified, with fistula: Secondary | ICD-10-CM | POA: Insufficient documentation

## 2018-06-06 DIAGNOSIS — Z6831 Body mass index (BMI) 31.0-31.9, adult: Secondary | ICD-10-CM | POA: Insufficient documentation

## 2018-06-06 DIAGNOSIS — K219 Gastro-esophageal reflux disease without esophagitis: Secondary | ICD-10-CM | POA: Insufficient documentation

## 2018-06-06 DIAGNOSIS — Z86718 Personal history of other venous thrombosis and embolism: Secondary | ICD-10-CM | POA: Diagnosis not present

## 2018-06-06 DIAGNOSIS — Z419 Encounter for procedure for purposes other than remedying health state, unspecified: Secondary | ICD-10-CM

## 2018-06-06 DIAGNOSIS — Z7982 Long term (current) use of aspirin: Secondary | ICD-10-CM | POA: Insufficient documentation

## 2018-06-06 DIAGNOSIS — R011 Cardiac murmur, unspecified: Secondary | ICD-10-CM | POA: Diagnosis not present

## 2018-06-06 DIAGNOSIS — Z9049 Acquired absence of other specified parts of digestive tract: Secondary | ICD-10-CM | POA: Insufficient documentation

## 2018-06-06 DIAGNOSIS — K8010 Calculus of gallbladder with chronic cholecystitis without obstruction: Secondary | ICD-10-CM | POA: Diagnosis present

## 2018-06-06 DIAGNOSIS — E669 Obesity, unspecified: Secondary | ICD-10-CM | POA: Insufficient documentation

## 2018-06-06 DIAGNOSIS — I509 Heart failure, unspecified: Secondary | ICD-10-CM | POA: Insufficient documentation

## 2018-06-06 DIAGNOSIS — G40909 Epilepsy, unspecified, not intractable, without status epilepticus: Secondary | ICD-10-CM | POA: Diagnosis not present

## 2018-06-06 DIAGNOSIS — Z933 Colostomy status: Secondary | ICD-10-CM | POA: Diagnosis not present

## 2018-06-06 HISTORY — DX: Muscle weakness (generalized): M62.81

## 2018-06-06 HISTORY — DX: Urticaria, unspecified: L50.9

## 2018-06-06 HISTORY — DX: Allergic rhinitis, unspecified: J30.9

## 2018-06-06 HISTORY — DX: Benign prostatic hyperplasia without lower urinary tract symptoms: N40.0

## 2018-06-06 HISTORY — DX: Heart failure, unspecified: I50.9

## 2018-06-06 HISTORY — DX: Unsteadiness on feet: R26.81

## 2018-06-06 HISTORY — DX: Sepsis, unspecified organism: A41.9

## 2018-06-06 HISTORY — DX: Other malaise: R53.81

## 2018-06-06 HISTORY — DX: Nonrheumatic aortic (valve) insufficiency: I35.1

## 2018-06-06 HISTORY — DX: Vitamin D deficiency, unspecified: E55.9

## 2018-06-06 HISTORY — DX: Hypokalemia: E87.6

## 2018-06-06 HISTORY — DX: Dysphagia, unspecified: R13.10

## 2018-06-06 HISTORY — DX: Personal history of immunosuppression therapy: Z92.25

## 2018-06-06 HISTORY — PX: CHOLECYSTECTOMY: SHX55

## 2018-06-06 HISTORY — DX: Unspecified severe protein-calorie malnutrition: E43

## 2018-06-06 HISTORY — DX: Dizziness and giddiness: R42

## 2018-06-06 HISTORY — DX: Tremor, unspecified: R25.1

## 2018-06-06 LAB — COMPREHENSIVE METABOLIC PANEL
ALK PHOS: 149 U/L — AB (ref 38–126)
ALT: 24 U/L (ref 0–44)
AST: 30 U/L (ref 15–41)
Albumin: 3.2 g/dL — ABNORMAL LOW (ref 3.5–5.0)
Anion gap: 11 (ref 5–15)
BILIRUBIN TOTAL: 0.8 mg/dL (ref 0.3–1.2)
BUN: 16 mg/dL (ref 8–23)
CALCIUM: 8.8 mg/dL — AB (ref 8.9–10.3)
CO2: 23 mmol/L (ref 22–32)
CREATININE: 0.83 mg/dL (ref 0.61–1.24)
Chloride: 105 mmol/L (ref 98–111)
GFR calc Af Amer: >60 mL/min (ref >60)
Glucose, Bld: 99 mg/dL (ref 70–99)
Potassium: 4.6 mmol/L (ref 3.5–5.1)
Sodium: 139 mmol/L (ref 135–145)
TOTAL PROTEIN: 7.4 g/dL (ref 6.5–8.1)

## 2018-06-06 LAB — CBC WITH DIFFERENTIAL/PLATELET
BASOS ABS: 0.1 10*3/uL (ref 0.0–0.1)
BASOS PCT: 1 %
EOS ABS: 0 10*3/uL (ref 0.0–0.7)
EOS PCT: 0 %
HCT: 41.5 % (ref 39.0–52.0)
Hemoglobin: 13.4 g/dL (ref 13.0–17.0)
LYMPHS ABS: 0.8 10*3/uL (ref 0.7–4.0)
Lymphocytes Relative: 11 %
MCH: 31.7 pg (ref 26.0–34.0)
MCHC: 32.3 g/dL (ref 30.0–36.0)
MCV: 98.1 fL (ref 78.0–100.0)
Monocytes Absolute: 0.9 10*3/uL (ref 0.1–1.0)
Monocytes Relative: 13 %
Neutro Abs: 5.1 10*3/uL (ref 1.7–7.7)
Neutrophils Relative %: 75 %
PLATELETS: 338 10*3/uL (ref 150–400)
RBC: 4.23 MIL/uL (ref 4.22–5.81)
RDW: 16.2 % — ABNORMAL HIGH (ref 11.5–15.5)
WBC: 6.8 10*3/uL (ref 4.0–10.5)

## 2018-06-06 SURGERY — LAPAROSCOPIC CHOLECYSTECTOMY WITH INTRAOPERATIVE CHOLANGIOGRAM
Anesthesia: General

## 2018-06-06 MED ORDER — HYDROCODONE-ACETAMINOPHEN 5-325 MG PO TABS
1.0000 | ORAL_TABLET | ORAL | Status: DC | PRN
  Administered 2018-06-06: 2 via ORAL
  Administered 2018-06-07: 1 via ORAL
  Filled 2018-06-06: qty 1
  Filled 2018-06-06: qty 2

## 2018-06-06 MED ORDER — KCL IN DEXTROSE-NACL 20-5-0.45 MEQ/L-%-% IV SOLN
INTRAVENOUS | Status: DC
  Administered 2018-06-06 – 2018-06-07 (×2): via INTRAVENOUS
  Filled 2018-06-06 (×3): qty 1000

## 2018-06-06 MED ORDER — HEPARIN SODIUM (PORCINE) 5000 UNIT/ML IJ SOLN
5000.0000 [IU] | Freq: Three times a day (TID) | INTRAMUSCULAR | Status: DC
  Administered 2018-06-06 – 2018-06-07 (×2): 5000 [IU] via SUBCUTANEOUS
  Filled 2018-06-06 (×2): qty 1

## 2018-06-06 MED ORDER — GABAPENTIN 300 MG PO CAPS
300.0000 mg | ORAL_CAPSULE | ORAL | Status: AC
  Administered 2018-06-06: 300 mg via ORAL

## 2018-06-06 MED ORDER — LEVETIRACETAM 500 MG PO TABS
500.0000 mg | ORAL_TABLET | Freq: Two times a day (BID) | ORAL | Status: DC
  Administered 2018-06-06 – 2018-06-07 (×2): 500 mg via ORAL
  Filled 2018-06-06 (×2): qty 1

## 2018-06-06 MED ORDER — IOPAMIDOL (ISOVUE-300) INJECTION 61%
INTRAVENOUS | Status: DC | PRN
  Administered 2018-06-06: 4.5 mL

## 2018-06-06 MED ORDER — PRIMIDONE 250 MG PO TABS
250.0000 mg | ORAL_TABLET | Freq: Two times a day (BID) | ORAL | Status: DC
  Administered 2018-06-06 – 2018-06-07 (×2): 250 mg via ORAL
  Filled 2018-06-06 (×3): qty 1

## 2018-06-06 MED ORDER — LIP MEDEX EX OINT
TOPICAL_OINTMENT | CUTANEOUS | Status: AC
  Administered 2018-06-06: 1
  Filled 2018-06-06: qty 7

## 2018-06-06 MED ORDER — ONDANSETRON HCL 4 MG PO TABS: 4.0000 mg | ORAL_TABLET | Freq: Four times a day (QID) | ORAL | Status: DC | PRN

## 2018-06-06 MED ORDER — ROCURONIUM BROMIDE 10 MG/ML (PF) SYRINGE
PREFILLED_SYRINGE | INTRAVENOUS | Status: DC | PRN
  Administered 2018-06-06: 30 mg via INTRAVENOUS
  Administered 2018-06-06 (×2): 10 mg via INTRAVENOUS
  Administered 2018-06-06: 5 mg via INTRAVENOUS
  Administered 2018-06-06 (×2): 10 mg via INTRAVENOUS

## 2018-06-06 MED ORDER — 0.9 % SODIUM CHLORIDE (POUR BTL) OPTIME
TOPICAL | Status: DC | PRN
  Administered 2018-06-06: 1000 mL

## 2018-06-06 MED ORDER — BUPIVACAINE-EPINEPHRINE (PF) 0.25% -1:200000 IJ SOLN
INTRAMUSCULAR | Status: AC
  Filled 2018-06-06: qty 30

## 2018-06-06 MED ORDER — FENTANYL CITRATE (PF) 250 MCG/5ML IJ SOLN
INTRAMUSCULAR | Status: DC | PRN
  Administered 2018-06-06: 50 ug via INTRAVENOUS
  Administered 2018-06-06: 25 ug via INTRAVENOUS
  Administered 2018-06-06: 100 ug via INTRAVENOUS

## 2018-06-06 MED ORDER — OLOPATADINE HCL 0.1 % OP SOLN: 1.0000 [drp] | Freq: Every day | OPHTHALMIC | Status: DC | PRN

## 2018-06-06 MED ORDER — IOPAMIDOL (ISOVUE-300) INJECTION 61%
INTRAVENOUS | Status: AC
  Filled 2018-06-06: qty 50

## 2018-06-06 MED ORDER — SUGAMMADEX SODIUM 500 MG/5ML IV SOLN
INTRAVENOUS | Status: DC | PRN
  Administered 2018-06-06: 250 mg via INTRAVENOUS

## 2018-06-06 MED ORDER — SUCCINYLCHOLINE CHLORIDE 200 MG/10ML IV SOSY
PREFILLED_SYRINGE | INTRAVENOUS | Status: DC | PRN
  Administered 2018-06-06: 120 mg via INTRAVENOUS

## 2018-06-06 MED ORDER — PROPOFOL 10 MG/ML IV BOLUS
INTRAVENOUS | Status: DC | PRN
  Administered 2018-06-06: 150 mg via INTRAVENOUS

## 2018-06-06 MED ORDER — MORPHINE SULFATE (PF) 2 MG/ML IV SOLN
1.0000 mg | INTRAVENOUS | Status: DC | PRN
  Administered 2018-06-06: 1 mg via INTRAVENOUS
  Filled 2018-06-06: qty 1

## 2018-06-06 MED ORDER — CEFAZOLIN SODIUM-DEXTROSE 2-4 GM/100ML-% IV SOLN
INTRAVENOUS | Status: AC
  Filled 2018-06-06: qty 100

## 2018-06-06 MED ORDER — PROPOFOL 10 MG/ML IV BOLUS
INTRAVENOUS | Status: AC
  Filled 2018-06-06: qty 20

## 2018-06-06 MED ORDER — ONDANSETRON HCL 4 MG/2ML IJ SOLN: 4.0000 mg | Freq: Once | INTRAMUSCULAR | Status: DC | PRN

## 2018-06-06 MED ORDER — FENTANYL CITRATE (PF) 100 MCG/2ML IJ SOLN
INTRAMUSCULAR | Status: AC
  Filled 2018-06-06: qty 2

## 2018-06-06 MED ORDER — ONDANSETRON HCL 4 MG/2ML IJ SOLN
INTRAMUSCULAR | Status: DC | PRN
  Administered 2018-06-06: 4 mg via INTRAVENOUS

## 2018-06-06 MED ORDER — MIDAZOLAM HCL 2 MG/2ML IJ SOLN
INTRAMUSCULAR | Status: AC
  Filled 2018-06-06: qty 2

## 2018-06-06 MED ORDER — BUPIVACAINE-EPINEPHRINE 0.25% -1:200000 IJ SOLN
INTRAMUSCULAR | Status: DC | PRN
  Administered 2018-06-06: 30 mL

## 2018-06-06 MED ORDER — GUAIFENESIN 100 MG/5ML PO SOLN: 200.0000 mg | Freq: Four times a day (QID) | ORAL | Status: DC | PRN

## 2018-06-06 MED ORDER — MEPERIDINE HCL 50 MG/ML IJ SOLN: 6.2500 mg | INTRAMUSCULAR | Status: DC | PRN

## 2018-06-06 MED ORDER — ONDANSETRON 4 MG PO TBDP: 4.0000 mg | ORAL_TABLET | Freq: Four times a day (QID) | ORAL | Status: DC | PRN

## 2018-06-06 MED ORDER — LORATADINE 10 MG PO TABS
10.0000 mg | ORAL_TABLET | Freq: Every day | ORAL | Status: DC
  Administered 2018-06-07: 10 mg via ORAL
  Filled 2018-06-06: qty 1

## 2018-06-06 MED ORDER — ACETAMINOPHEN 500 MG PO TABS
ORAL_TABLET | ORAL | Status: AC
  Filled 2018-06-06: qty 2

## 2018-06-06 MED ORDER — LACTATED RINGERS IV SOLN
INTRAVENOUS | Status: DC
  Administered 2018-06-06 (×2): via INTRAVENOUS

## 2018-06-06 MED ORDER — CEFAZOLIN SODIUM-DEXTROSE 2-4 GM/100ML-% IV SOLN
2.0000 g | INTRAVENOUS | Status: AC
  Administered 2018-06-06: 2 g via INTRAVENOUS

## 2018-06-06 MED ORDER — ACETAMINOPHEN 500 MG PO TABS
1000.0000 mg | ORAL_TABLET | ORAL | Status: AC
  Administered 2018-06-06: 1000 mg via ORAL

## 2018-06-06 MED ORDER — DEXAMETHASONE SODIUM PHOSPHATE 10 MG/ML IJ SOLN
INTRAMUSCULAR | Status: DC | PRN
  Administered 2018-06-06: 10 mg via INTRAVENOUS

## 2018-06-06 MED ORDER — IPRATROPIUM BROMIDE 0.03 % NA SOLN
1.0000 | Freq: Two times a day (BID) | NASAL | Status: DC
  Administered 2018-06-07: 1 via NASAL
  Filled 2018-06-06: qty 30

## 2018-06-06 MED ORDER — FAMOTIDINE 20 MG PO TABS
20.0000 mg | ORAL_TABLET | Freq: Every day | ORAL | Status: DC
  Administered 2018-06-07: 20 mg via ORAL
  Filled 2018-06-06: qty 1

## 2018-06-06 MED ORDER — CHLORHEXIDINE GLUCONATE CLOTH 2 % EX PADS: 6.0000 | MEDICATED_PAD | Freq: Once | CUTANEOUS | Status: DC

## 2018-06-06 MED ORDER — FENTANYL CITRATE (PF) 100 MCG/2ML IJ SOLN
25.0000 ug | INTRAMUSCULAR | Status: DC | PRN
  Administered 2018-06-06: 50 ug via INTRAVENOUS

## 2018-06-06 MED ORDER — GABAPENTIN 300 MG PO CAPS
ORAL_CAPSULE | ORAL | Status: AC
  Filled 2018-06-06: qty 1

## 2018-06-06 MED ORDER — FENTANYL CITRATE (PF) 250 MCG/5ML IJ SOLN
INTRAMUSCULAR | Status: AC
  Filled 2018-06-06: qty 5

## 2018-06-06 MED ORDER — TAMSULOSIN HCL 0.4 MG PO CAPS
0.4000 mg | ORAL_CAPSULE | Freq: Every day | ORAL | Status: DC
  Administered 2018-06-07: 0.4 mg via ORAL
  Filled 2018-06-06: qty 1

## 2018-06-06 MED ORDER — TRAMADOL HCL 50 MG PO TABS: 50.0000 mg | ORAL_TABLET | Freq: Four times a day (QID) | ORAL | Status: DC | PRN

## 2018-06-06 MED ORDER — MESALAMINE 1.2 G PO TBEC
2.4000 g | DELAYED_RELEASE_TABLET | Freq: Every day | ORAL | Status: DC
  Administered 2018-06-07: 2.4 g via ORAL
  Filled 2018-06-06: qty 2

## 2018-06-06 MED ORDER — LIDOCAINE 2% (20 MG/ML) 5 ML SYRINGE
INTRAMUSCULAR | Status: DC | PRN
  Administered 2018-06-06: 60 mg via INTRAVENOUS

## 2018-06-06 MED ORDER — ONDANSETRON HCL 4 MG/2ML IJ SOLN: 4.0000 mg | Freq: Four times a day (QID) | INTRAMUSCULAR | Status: DC | PRN

## 2018-06-06 MED ORDER — MIDAZOLAM HCL 2 MG/2ML IJ SOLN
INTRAMUSCULAR | Status: DC | PRN
  Administered 2018-06-06: 1 mg via INTRAVENOUS

## 2018-06-06 MED ORDER — LACTATED RINGERS IR SOLN
Status: DC | PRN
  Administered 2018-06-06: 5000 mL

## 2018-06-06 SURGICAL SUPPLY — 46 items
ADH SKN CLS APL DERMABOND .7 (GAUZE/BANDAGES/DRESSINGS) ×1
APL SKNCLS STERI-STRIP NONHPOA (GAUZE/BANDAGES/DRESSINGS)
APPLIER CLIP 5 13 M/L LIGAMAX5 (MISCELLANEOUS)
APPLIER CLIP ROT 10 11.4 M/L (STAPLE) ×2
APR CLP MED LRG 11.4X10 (STAPLE) ×1
APR CLP MED LRG 5 ANG JAW (MISCELLANEOUS)
BAG SPEC RTRVL 10 TROC 200 (ENDOMECHANICALS) ×1
BENZOIN TINCTURE PRP APPL 2/3 (GAUZE/BANDAGES/DRESSINGS) IMPLANT
CABLE HIGH FREQUENCY MONO STRZ (ELECTRODE) ×2 IMPLANT
CHLORAPREP W/TINT 26ML (MISCELLANEOUS) ×2 IMPLANT
CHOLANGIOGRAM CATH TAUT (CATHETERS) ×2 IMPLANT
CLIP APPLIE 5 13 M/L LIGAMAX5 (MISCELLANEOUS) IMPLANT
CLIP APPLIE ROT 10 11.4 M/L (STAPLE) IMPLANT
COVER MAYO STAND STRL (DRAPES) ×2 IMPLANT
COVER SURGICAL LIGHT HANDLE (MISCELLANEOUS) ×2 IMPLANT
DECANTER SPIKE VIAL GLASS SM (MISCELLANEOUS) ×2 IMPLANT
DERMABOND ADVANCED (GAUZE/BANDAGES/DRESSINGS) ×1
DERMABOND ADVANCED .7 DNX12 (GAUZE/BANDAGES/DRESSINGS) IMPLANT
DRAPE C-ARM 42X120 X-RAY (DRAPES) ×2 IMPLANT
ELECT REM PT RETURN 15FT ADLT (MISCELLANEOUS) ×2 IMPLANT
GLOVE BIO SURGEON STRL SZ7.5 (GLOVE) ×1 IMPLANT
GLOVE BIOGEL PI IND STRL 7.0 (GLOVE) IMPLANT
GLOVE BIOGEL PI INDICATOR 7.0 (GLOVE) ×2
GLOVE ECLIPSE 7.0 STRL STRAW (GLOVE) ×1 IMPLANT
GLOVE SURG SIGNA 7.5 PF LTX (GLOVE) ×2 IMPLANT
GOWN STRL REUS W/TWL XL LVL3 (GOWN DISPOSABLE) ×7 IMPLANT
HEMOSTAT SURGICEL 4X8 (HEMOSTASIS) ×1 IMPLANT
IV CATH 14GX2 1/4 (CATHETERS) ×2 IMPLANT
IV SET EXTENSION CATH 6 NF (IV SETS) ×2 IMPLANT
KIT BASIN OR (CUSTOM PROCEDURE TRAY) ×2 IMPLANT
POUCH RETRIEVAL ECOSAC 10 (ENDOMECHANICALS) ×1 IMPLANT
POUCH RETRIEVAL ECOSAC 10MM (ENDOMECHANICALS) ×1
SCISSORS LAP 5X35 DISP (ENDOMECHANICALS) ×2 IMPLANT
SET IRRIG TUBING LAPAROSCOPIC (IRRIGATION / IRRIGATOR) ×2 IMPLANT
SLEEVE ADV FIXATION 5X100MM (TROCAR) ×3 IMPLANT
STOPCOCK 4 WAY LG BORE MALE ST (IV SETS) ×2 IMPLANT
STRIP CLOSURE SKIN 1/4X4 (GAUZE/BANDAGES/DRESSINGS) IMPLANT
SUT MNCRL AB 4-0 PS2 18 (SUTURE) ×3 IMPLANT
SYR 10ML ECCENTRIC (SYRINGE) ×2 IMPLANT
TOWEL OR 17X26 10 PK STRL BLUE (TOWEL DISPOSABLE) ×2 IMPLANT
TOWEL OR NON WOVEN STRL DISP B (DISPOSABLE) ×2 IMPLANT
TRAY LAPAROSCOPIC (CUSTOM PROCEDURE TRAY) ×2 IMPLANT
TROCAR ADV FIXATION 11X100MM (TROCAR) ×1 IMPLANT
TROCAR ADV FIXATION 5X100MM (TROCAR) ×2 IMPLANT
TROCAR XCEL BLUNT TIP 100MML (ENDOMECHANICALS) ×2 IMPLANT
TUBING INSUF HEATED (TUBING) ×2 IMPLANT

## 2018-06-06 NOTE — Interval H&P Note (Signed)
History and Physical Interval Note:  06/06/2018 8:42 AM  [name]  has presented today for surgery, with the diagnosis of Gall Bladder disease, Cholelithiasis  The various methods of treatment have been discussed with the patient and family.   [name] [place], his cousin, is with him.  After consideration of risks, benefits and other options for treatment, the patient has consented to  Procedure(s): LAPAROSCOPIC CHOLECYSTECTOMY WITH INTRAOPERATIVE CHOLANGIOGRAM ERAS PATHWAY (N/A) as a surgical intervention .  The patient's history has been reviewed, patient examined, no change in status, stable for surgery.  I have reviewed the patient's chart and labs.  Questions were answered to the patient's satisfaction.     Shann Medal

## 2018-06-06 NOTE — Anesthesia Procedure Notes (Signed)
Date/Time: 06/06/2018 11:42 AM Performed by: Cynda Familia, CRNA Oxygen Delivery Method: Simple face mask Placement Confirmation: positive ETCO2 and breath sounds checked- equal and bilateral Dental Injury: Teeth and Oropharynx as per pre-operative assessment

## 2018-06-06 NOTE — Anesthesia Procedure Notes (Signed)
Procedure Name: Intubation Date/Time: 06/06/2018 9:43 AM Performed by: Cynda Familia, CRNA Pre-anesthesia Checklist: Patient identified, Emergency Drugs available, Suction available and Patient being monitored Patient Re-evaluated:Patient Re-evaluated prior to induction Oxygen Delivery Method: Circle System Utilized Preoxygenation: Pre-oxygenation with 100% oxygen Induction Type: IV induction Ventilation: Mask ventilation without difficulty Laryngoscope Size: Glidescope Grade View: Grade I Tube type: Parker flex tip Number of attempts: 1 Airway Equipment and Method: Stylet Placement Confirmation: ETT inserted through vocal cords under direct vision,  positive ETCO2 and breath sounds checked- equal and bilateral Secured at: 21 cm Tube secured with: Tape Dental Injury: Teeth and Oropharynx as per pre-operative assessment  Comments: Smooth IV induction Foster-- intubation AM CRNA with Glidescope due to limited ROM of mouth and neck--- atraumatic-- teeth and mouth as preop-- bilat BS [facility]

## 2018-06-06 NOTE — Op Note (Addendum)
06/06/2018  11:31 AM  PATIENT:  [name], 62 y.o., male, MRN: [id number]  PREOP DIAGNOSIS:  Gall Bladder disease, Cholelithiasis  POSTOP DIAGNOSIS:   Severe chronic cholecystitis, cholelthiasis  PROCEDURE:   Procedure(s):  LAPAROSCOPIC CHOLECYSTECTOMY WITH INTRAOPERATIVE CHOLANGIOGRAM ERAS PATHWAY  SURGEON:   Alphonsa Overall, M.D.  ASSISTANT:   P. Marlou Starks, M.D.  ANESTHESIA:   general  Anesthesiologist: Josephine Igo, MD CRNA: Victoriano Lain, CRNA; Cynda Familia, CRNA  General  ASA: 3  EBL:  minimal  ml  BLOOD ADMINISTERED: none  DRAINS: none   LOCAL MEDICATIONS USED:   30 cc of 1/4% marcaine  SPECIMEN:   Gall bladder  COUNTS CORRECT:  YES  INDICATIONS FOR PROCEDURE:  [name] is a 62 y.o. (DOB: [birth year]) white male whose primary care physician is Leonard Downing, MD and comes for cholecystectomy.   The indications and risks of the gall bladder surgery were explained to the patient.  The risks include, but are not limited to, infection, bleeding, common bile duct injury and open surgery.  SURGERY:  The patient was taken to [facility] #[facility] at [facility].  The abdomen was prepped with chloroprep.  The patient was given 2 gm Ancef at the beginning of the operation.   A time out was held and the surgical checklist run.   I accessed the abdominal cavity in the right upper quadrant with a 5 mm Ethicon Optiview trocar.  Four additional trocars were inserted: a 10 mm trocar in the sub-xiphoid location, a 5 mm trocar in the right mid subcostal area, a 5 mm trocar mid way between the xiphoid and umbilicus, and a 5 mm trocar in the right lateral subcostal area.   The abdominal cavity had adhesions from the prior surgery.  I spent about 10 minutes taking down the adhesions to create a space to work.  I left alone adhesion in the left abdomen where small bowel was adhered to anterior abdominal wall.  The liver had adhesions over the dome, but the liver  otherwise looked good.  The stomach looked good.   The gall bladder was encased in omentum.  The gall bladder was chronically inflamed and thickened.   I grasped the gall bladder and rotated it cephalad.  Disssection was carried down to the gall bladder/cystic duct junction and the cystic duct isolated.  The dissection was tedious, but I though I identified the key structures and created a critical view.   A clip was placed on the gall bladder side of the cystic duct.   An intra-operative cholangiogram was shot.   The intra-operative cholangiogram was shot using a cut off Taut catheter placed through a 14 gauge angiocath in the RUQ.  The Taut catheter was inserted in the cut cystic duct and secured with an endoclip.  A cholangiogram was shot with 8 cc of 1/2 strength Isoview.  Using fluoroscopy, the cholangiogram showed the flow of contrast into the common bile duct, up the hepatic radicals, and into the duodenum.  There was no mass or obstruction.  This was a normal intra-operative cholangiogram.   The Taut catheter was removed.  The cystic duct was tripley endoclipped and the cystic artery was identified and clipped.  The gall bladder was bluntly and sharpley dissected from the gall bladder bed.  The gall bladder was intra-hepatic.  And the wall of the gall bladder tore easily while retracting the gall bladder.  The gall bladder was completely packed with stones and debris.  I tore into the gall bladder and spilled stones.  I spent a fair amount of time retrieving these stones.   After the gall bladder was removed from the liver, the gall bladder bed and Triangle of [place]ot were inspected.  There was no bleeding or bile leak.  The gall bladder was placed in a Ecco Sac bag and delivered through the umbilicus.  The abdomen was irrigated with 5,000 cc saline.  The bed of his gall bladder/liver was chewed up in getting gall bladder out.  I placed some Surgicel in the gall bladder bed.   The trocars were then  removed.  I infiltrated 30 cc of 1/4% Marcaine into the incisions.  The umbilical port closed with a 0 Vicryl suture and the skin closed with 4-0 Monocryl.  The skin was painted with DermaBond.  The patient's sponge and needle count were correct.  The patient was transported to the RR in good condition.  Alphonsa Overall, MD, [facility] Surgery Pager: [phone] Office phone:  [phone]

## 2018-06-06 NOTE — Anesthesia Preprocedure Evaluation (Signed)
Anesthesia Evaluation  Patient identified by MRN, date of birth, ID band Patient awake    Reviewed: Allergy & Precautions, NPO status , Patient's Chart, lab work & pertinent test results  Airway Mallampati: III  TM Distance: >3 FB Neck ROM: Limited    Dental no notable dental hx. (+) Teeth Intact   Pulmonary neg pulmonary ROS,    Pulmonary exam normal breath sounds clear to auscultation       Cardiovascular +CHF and + DVT  Normal cardiovascular exam+ Valvular Problems/Murmurs AS and AI  Rhythm:Regular Rate:Normal  EKG 04/23/2018- NSR borderline prolongation QT  ECHO 01/18/2016 Left ventricle: The cavity size was mildly dilated. Systolic  function was normal. The estimated ejection fraction was in the range of 50% to 55%. Features are consistent with a pseudonormal left ventricular filling pattern, with concomitant abnormal relaxation and increased filling pressure (grade 2 diastolic dysfunction). Doppler parameters are consistent with elevated ventricular end-diastolic filling pressure. - Aortic valve: There was very mild stenosis. There was moderate regurgitation. Peak velocity (S): 228 cm/s. Mean gradient (S): 11 mm Hg. Regurgitation pressure half-time: 393 ms. - Mitral valve: There was no evidence for stenosis. There was no regurgitation. - Right ventricle: The cavity size was normal. Wall thickness was normal. Systolic function was normal. - Pericardium, extracardiac: A trivial pericardial effusion was identified. There was no evidence of hemodynamic compromise.   Neuro/Psych Seizures -, Well Controlled,  Mental deficiencyPetit mal    GI/Hepatic Neg liver ROS, GERD  Medicated and Controlled,Cholelithiasis Dysphagia Crohn's disease S/P Colostomy and reversal   Endo/Other  negative endocrine ROS  Renal/GU negative Renal ROS   BPH    Musculoskeletal negative musculoskeletal ROS (+) Hx/o ankylosing spondylitis    Abdominal (+) + obese,   Peds  Hematology  (+) anemia ,   Anesthesia Other Findings   Reproductive/Obstetrics                             Anesthesia Physical Anesthesia Plan  ASA: III  Anesthesia Plan: General   Post-op Pain Management:    Induction: Intravenous  PONV Risk Score and Plan: 3 and Ondansetron, Dexamethasone, Treatment may vary due to age or medical condition and Midazolam  Airway Management Planned: Oral ETT and Video Laryngoscope Planned  Additional Equipment:   Intra-op Plan:   Post-operative Plan: Extubation in OR  Informed Consent: I have reviewed the patients History and Physical, chart, labs and discussed the procedure including the risks, benefits and alternatives for the proposed anesthesia with the patient or authorized representative who has indicated his/her understanding and acceptance.   Dental advisory given  Plan Discussed with: CRNA and Surgeon  Anesthesia Plan Comments:         Anesthesia Quick Evaluation

## 2018-06-06 NOTE — Anesthesia Postprocedure Evaluation (Signed)
Anesthesia Post Note  Patient: [name]  Procedure(s) Performed: LAPAROSCOPIC CHOLECYSTECTOMY WITH INTRAOPERATIVE CHOLANGIOGRAM ERAS PATHWAY (N/A )     Patient location during evaluation: PACU Anesthesia Type: General Level of consciousness: awake and alert and oriented Pain management: pain level controlled Vital Signs Assessment: post-procedure vital signs reviewed and stable Respiratory status: spontaneous breathing, nonlabored ventilation and respiratory function stable Cardiovascular status: blood pressure returned to baseline and stable Postop Assessment: no apparent nausea or vomiting Anesthetic complications: no    Last Vitals:  Vitals:   06/06/18 1200 06/06/18 1215  BP: (!) 141/60 (!) 143/60  Pulse: 71 69  Resp: 11 11  Temp:    SpO2: 100% 100%    Last Pain:  Vitals:   06/06/18 1215  TempSrc:   PainSc: 0-No pain                 Millie Forde A.

## 2018-06-06 NOTE — Transfer of Care (Signed)
Immediate Anesthesia Transfer of Care Note  Patient: [name]  Procedure(s) Performed: LAPAROSCOPIC CHOLECYSTECTOMY WITH INTRAOPERATIVE CHOLANGIOGRAM ERAS PATHWAY (N/A )  Patient Location: PACU  Anesthesia Type:General  Level of Consciousness: sedated  Airway & Oxygen Therapy: Patient Spontanous Breathing and Patient connected to face mask oxygen  Post-op Assessment: Report given to RN and Post -op Vital signs reviewed and stable  Post vital signs: Reviewed and stable  Last Vitals:  Vitals Value Taken Time  BP 142/61 06/06/2018 12:45 PM  Temp    Pulse 68 06/06/2018 12:58 PM  Resp 14 06/06/2018 12:58 PM  SpO2 98 % 06/06/2018 12:58 PM  Vitals shown include unvalidated device data.  Last Pain:  Vitals:   06/06/18 1245  TempSrc:   PainSc: 5          Complications: No apparent anesthesia complications

## 2018-06-07 ENCOUNTER — Encounter (HOSPITAL_COMMUNITY): Payer: Self-pay | Admitting: Surgery

## 2018-06-07 DIAGNOSIS — K8010 Calculus of gallbladder with chronic cholecystitis without obstruction: Secondary | ICD-10-CM | POA: Diagnosis not present

## 2018-06-07 MED ORDER — TRAMADOL HCL 50 MG PO TABS: 50.0000 mg | ORAL_TABLET | Freq: Four times a day (QID) | ORAL | 0 refills | Status: DC | PRN

## 2018-06-07 NOTE — Progress Notes (Signed)
Patient from [facility]. CSW assisting with the patient return to the facility. CSW confirmed with nursing administration [name] and Lattie Haw the sign fl2 and discharge summary received.  CSW informed patient sister about the patient return.  PTAR arranged for transport.   Kathrin Greathouse, Marlinda Mike, MSW Clinical Social Worker  [phone] 06/07/2018  3:01 PM

## 2018-06-07 NOTE — NC FL2 (Addendum)
[place] LEVEL OF CARE SCREENING TOOL     IDENTIFICATION  Patient Name: [name] Birthdate: Apr 11, 1956 Sex: male Admission Date (Current Location): 06/06/2018  [facility] and [place] Number:  Herbalist and Address:  [facility],  [place] [address], [place]      Provider Number: [id number]  Attending Physician Name and Address:  Alphonsa Overall, MD  Relative Name and Phone Number:       Current Level of Care: Hospital Recommended Level of Care: [place] Prior Approval Number:    Date Approved/Denied:   PASRR Number:    Discharge Plan: ([place] )    Current Diagnoses: Patient Active Problem List   Diagnosis Date Noted  . Cholecystitis with cholelithiasis 06/06/2018  . Diastolic CHF, chronic ([place]) 04/20/2018  . Catheter-associated urinary tract infection ([place]) 04/19/2018  . Mental disability   . S/P colostomy takedown 08/01/2016  . 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 Sepsis ([place]) 12/01/2015  . 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 Aortic valve stenosis, mild 03/31/2014  . Aortic valve regurgitation 03/31/2014  . Pseudopolyposis of colon ([place]) 01/03/2012  . Crohn's disease ([place]) 12/19/2011  . Personal history of immunosupression therapy 12/19/2011  . ECZEMA 08/21/2008  . CHOLELITHIASIS, ASYMPTOMATIC 01/29/2008  . VITAMIN B12 DEFICIENCY 01/17/2008    Orientation RESPIRATION BLADDER Height & Weight     Self, Time, Situation, Place  Normal Continent Weight: 218 lb (98.9 kg) Height:  5' 10"  (202.5 cm)  BEHAVIORAL SYMPTOMS/MOOD NEUROLOGICAL BOWEL NUTRITION STATUS      Continent Diet(Regular)  AMBULATORY STATUS COMMUNICATION OF NEEDS Skin   Limited Assist Verbally Normal                       Personal Care Assistance  Level of Assistance  Bathing, Feeding, Dressing Bathing Assistance: Limited assistance Feeding assistance: Independent Dressing Assistance: Limited assistance     Functional Limitations Info  Sight, Hearing, Speech Sight Info: Adequate Hearing Info: Adequate Speech Info: Adequate    SPECIAL CARE FACTORS FREQUENCY  PT (By licensed PT), OT (By licensed OT)     PT Frequency: 3x/week OT Frequency: 3x/week            Contractures Contractures Info: Not present    Additional Factors Info  Code Status, Allergies, Psychotropic Code Status Info: fullcode  Allergies Info:  Darvon Propoxyphene              Discharge Medications: Please see discharge summary for a list of discharge medications. Discharge Medications:        Allergies as of 06/07/2018      Reactions   Darvon [propoxyphene] Other (See Comments)   Unknown reaction         Medication List    TAKE these medications   guaifenesin 100 MG/5ML syrup Commonly known as:  ROBITUSSIN Take 200 mg by mouth every 6 (six) hours as needed for cough (Do not exceed 4 doses in 24 hours. If cough has not improved in 24 hours notify physician.).   ipratropium 0.03 % nasal spray Commonly known as:  ATROVENT Place 1 spray into both nostrils 2 (two) times daily.   levETIRAcetam 500 MG tablet Commonly known as:  KEPPRA Take 1 tablet (500 mg total) by mouth 2 (two) times daily.   loratadine 10 MG tablet Commonly known  as:  CLARITIN Take 10 mg by mouth daily.   mesalamine 1.2 g EC tablet Commonly known as:  LIALDA Take 2 tablets (2.4 g total) by mouth daily with breakfast.   multivitamins with iron Tabs tablet Take 1 tablet by mouth daily.   ondansetron 4 MG tablet Commonly known as:  ZOFRAN Take 4 mg by mouth every 6 (six) hours as needed for nausea or vomiting.   PAZEO 0.7 % Soln Generic drug:  Olopatadine HCl Place 1 drop into both eyes daily as needed (irritation).   primidone 250 MG  tablet Commonly known as:  MYSOLINE Take 250-500 mg by mouth See admin instructions. Take 250 mg by mouth in the morning and take 250 mg by mouth in the evening   ranitidine 150 MG capsule Commonly known as:  ZANTAC Take 150 mg by mouth 2 (two) times daily.   tamsulosin 0.4 MG Caps capsule Commonly known as:  FLOMAX Take 1 capsule (0.4 mg total) by mouth daily.   traMADol 50 MG tablet Commonly known as:  ULTRAM Take 1 tablet (50 mg total) by mouth every 6 (six) hours as needed (mild pain).   Vitamin D (Ergocalciferol) 50000 units Caps capsule Commonly known as:  DRISDOL Take 50,000 Units by mouth every 30 (thirty) days.    Relevant Imaging Results:  Relevant Lab Results:   Additional Information SSN [id number].   Lia Hopping, LCSW

## 2018-06-07 NOTE — Progress Notes (Signed)
Report called to [facility]

## 2018-06-07 NOTE — Discharge Instructions (Signed)
[facility] - DISCHARGE INSTRUCTIONS TO PATIENT  Activity:  Lifting - No lifting more than 15 pounds for one week, then no limit  Wound Care:   May shower starting tomorrow  Diet:  As tolerated. Drink plenty of water  Follow up appointment:  Call Dr. Pollie Friar office [facility]) at [phone] for an appointment in 2 - 3 weeks.  Medications and dosages:  Resume your home medications.  You have a prescription for:  Ultram  Call Dr. Lucia Gaskins or his office  [phone]) if you have:  Temperature greater than 100.4,  Persistent nausea and vomiting,  Severe uncontrolled pain,  Redness, tenderness, or signs of infection (pain, swelling, redness, odor or green/yellow discharge around the site),  Any other questions or concerns you may have after discharge.  In an emergency, call 911 or go to an Emergency Department at a nearby hospital.

## 2018-06-07 NOTE — Discharge Summary (Signed)
Physician Discharge Summary  Patient ID:  [name]  MRN: [id number]  DOB/AGE: 64-08-1956 64 y.o.  Admit date: 06/06/2018 Discharge date: 06/07/2018  Discharge Diagnoses:  1.  Severe chronic cholecystitis, cholelithiasis 2.  HISTORY OF COLOSTOMY             Story: Colostomy closed - 08/02/2016 - D. Aurea Aronov 3.  CROHN'S DISEASE WITH FISTULA, UNSPECIFIED GASTROINTESTINAL TRACT LOCATION (K50.913)             Story: Laparoscopic enterolysis of adhesions (45 minutes), laparoscopic-assisted small bowel resection, hand assisted left hemicolectomy with mobilization of splenic flexure, and left transverse colon end colostomy with [name] pouch - 12/04/2015 - D. Srah Ake             Impression: Followed by [name]             On Humira - this is on hold for 2 weeks pre op and one week post op  4.  ACUTE DEEP VEIN THROMBOSIS (DVT) OF DISTAL VEIN OF LOWER EXTREMITY, UNSPECIFIED LATERALITY (I82.4Z9)             Impression: Completed therapy             On an aspirin  5. Seizure disorder 6. Learning challenged. 7. Idiopathic epilepsy 8. Left leg DVT post op 2017.[name]       He saw [name] for vascular.       He is off blood thinners except for aspirin. 9. Ankylosing spondylosis 10. Heart murmur, systolic   Active Problems:   Cholecystitis with cholelithiasis   Operation: Procedure(s): LAPAROSCOPIC CHOLECYSTECTOMY WITH INTRAOPERATIVE CHOLANGIOGRAM  on 06/06/2018  Discharged Condition: good  Hospital Course: [name] is an 62 y.o. male whose primary care physician is Leonard Downing, MD and who was admitted 06/06/2018 with a chief complaint of No chief complaint on file. [name]   He was brought to the operating room on 06/06/2018 and underwent  [place] CHOLANGIOGRAM. He is doing well.  He has tolerated liquids and will get solid food for lunch. He is okay to return to [facility] today.   The discharge instructions were  reviewed with the patient.  Consults: None  Significant Diagnostic Studies: Results for orders placed or performed during the hospital encounter of 06/06/18  Comprehensive metabolic panel  Result Value Ref Range   Sodium 139 135 - 145 mmol/L   Potassium 4.6 3.5 - 5.1 mmol/L   Chloride 105 98 - 111 mmol/L   CO2 23 22 - 32 mmol/L   Glucose, Bld 99 70 - 99 mg/dL   BUN 16 8 - 23 mg/dL   Creatinine, Ser 0.83 0.61 - 1.24 mg/dL   Calcium 8.8 (L) 8.9 - 10.3 mg/dL   Total Protein 7.4 6.5 - 8.1 g/dL   Albumin 3.2 (L) 3.5 - 5.0 g/dL   AST 30 15 - 41 U/L   ALT 24 0 - 44 U/L   Alkaline Phosphatase 149 (H) 38 - 126 U/L   Total Bilirubin 0.8 0.3 - 1.2 mg/dL   GFR calc non Af Amer >60 >60 mL/min   GFR calc Af Amer >60 >60 mL/min   Anion gap 11 5 - 15  CBC with Differential/Platelet  Result Value Ref Range   WBC 6.8 4.0 - 10.5 K/uL   RBC 4.23 4.22 - 5.81 MIL/uL   Hemoglobin 13.4 13.0 - 17.0 g/dL   HCT 41.5 39.0 - 52.0 %   MCV 98.1 78.0 - 100.0 fL  MCH 31.7 26.0 - 34.0 pg   MCHC 32.3 30.0 - 36.0 g/dL   RDW 16.2 (H) 11.5 - 15.5 %   Platelets 338 150 - 400 K/uL   Neutrophils Relative % 75 %   Neutro Abs 5.1 1.7 - 7.7 K/uL   Lymphocytes Relative 11 %   Lymphs Abs 0.8 0.7 - 4.0 K/uL   Monocytes Relative 13 %   Monocytes Absolute 0.9 0.1 - 1.0 K/uL   Eosinophils Relative 0 %   Eosinophils Absolute 0.0 0.0 - 0.7 K/uL   Basophils Relative 1 %   Basophils Absolute 0.1 0.0 - 0.1 K/uL    Dg Cholangiogram Operative  Result Date: 06/06/2018 CLINICAL DATA:  Intraoperative cholangiogram during laparoscopic cholecystectomy. EXAM: INTRAOPERATIVE CHOLANGIOGRAM FLUOROSCOPY TIME:  7 seconds (3.8 mGy) COMPARISON:  Right upper quadrant abdominal ultrasound-04/23/2018; CT abdomen pelvis-04/23/2018 FINDINGS: Intraoperative cholangiographic images of the right upper abdominal quadrant during laparoscopic cholecystectomy are provided for review. Surgical clips overlie the expected location of the gallbladder  fossa. Contrast injection demonstrates selective cannulation of the central aspect of the cystic duct. There is passage of contrast through the central aspect of the cystic duct with filling of a non dilated common bile duct. There is passage of contrast though the CBD and into the descending portion of the duodenum. There is minimal reflux of injected contrast into the common hepatic duct and central aspect of the non dilated intrahepatic biliary system. There is minimal opacification the central aspect of the pancreatic duct which appears nondilated. There are no discrete filling defects within the opacified portions of the biliary system to suggest the presence of choledocholithiasis. IMPRESSION: No evidence of choledocholithiasis. Electronically Signed   By: Sandi Mariscal M.D.   On: 06/06/2018 10:57    Discharge Exam:  Vitals:   06/07/18 0134 06/07/18 0554  BP: (!) 105/55 (!) 116/55  Pulse: (!) 58 65  Resp: 18 18  Temp: 98.3 F (36.8 C) (!) 97.4 F (36.3 C)  SpO2: 100% 99%    General: WN older WM who is alert. Lungs: Clear to auscultation and symmetric breath sounds. Heart:  RRR. 3/6 systolic murmur Abdomen: Soft. Normal bowel sounds.  Incisions look good.  Discharge Medications:   Allergies as of 06/07/2018      Reactions   Darvon [propoxyphene] Other (See Comments)   Unknown reaction      Medication List    TAKE these medications   guaifenesin 100 MG/5ML syrup Commonly known as:  ROBITUSSIN Take 200 mg by mouth every 6 (six) hours as needed for cough (Do not exceed 4 doses in 24 hours. If cough has not improved in 24 hours notify physician.).   ipratropium 0.03 % nasal spray Commonly known as:  ATROVENT Place 1 spray into both nostrils 2 (two) times daily.   levETIRAcetam 500 MG tablet Commonly known as:  KEPPRA Take 1 tablet (500 mg total) by mouth 2 (two) times daily.   loratadine 10 MG tablet Commonly known as:  CLARITIN Take 10 mg by mouth daily.   mesalamine 1.2  g EC tablet Commonly known as:  LIALDA Take 2 tablets (2.4 g total) by mouth daily with breakfast.   multivitamins with iron Tabs tablet Take 1 tablet by mouth daily.   ondansetron 4 MG tablet Commonly known as:  ZOFRAN Take 4 mg by mouth every 6 (six) hours as needed for nausea or vomiting.   PAZEO 0.7 % Soln Generic drug:  Olopatadine HCl Place 1 drop into both eyes daily as  needed (irritation).   primidone 250 MG tablet Commonly known as:  MYSOLINE Take 250-500 mg by mouth See admin instructions. Take 250 mg by mouth in the morning and take 250 mg by mouth in the evening   ranitidine 150 MG capsule Commonly known as:  ZANTAC Take 150 mg by mouth 2 (two) times daily.   tamsulosin 0.4 MG Caps capsule Commonly known as:  FLOMAX Take 1 capsule (0.4 mg total) by mouth daily.   traMADol 50 MG tablet Commonly known as:  ULTRAM Take 1 tablet (50 mg total) by mouth every 6 (six) hours as needed (mild pain).   Vitamin D (Ergocalciferol) 50000 units Caps capsule Commonly known as:  DRISDOL Take 50,000 Units by mouth every 30 (thirty) days.       Disposition: Discharge disposition: 01-Home or Self Care       Discharge Instructions    Diet - low sodium heart healthy   Complete by:  As directed    Increase activity slowly   Complete by:  As directed          Signed: Alphonsa Overall, M.D., [facility] Office:  [phone]  06/07/2018, 9:53 AM

## 2018-06-18 ENCOUNTER — Ambulatory Visit: Payer: Medicare Other | Admitting: Podiatry

## 2018-07-23 ENCOUNTER — Telehealth: Payer: Self-pay | Admitting: Neurology

## 2018-07-23 NOTE — Telephone Encounter (Signed)
Bloodwork from SNF reviewed:  06/29/18: Primidone level 16.7 (ref 3012); Phenobarbital level 28.5 (ref 15-40)  On Primidone 239m in AM, 5068min PM; Keppra 50027mID  No report of seizures or side effects except elevated alk phos, proceed with GI eval. Continue current meds.

## 2018-08-23 ENCOUNTER — Encounter: Payer: Self-pay | Admitting: Internal Medicine

## 2018-08-23 ENCOUNTER — Other Ambulatory Visit: Payer: Medicare Other

## 2018-08-23 ENCOUNTER — Ambulatory Visit (INDEPENDENT_AMBULATORY_CARE_PROVIDER_SITE_OTHER): Payer: Medicare Other | Admitting: Internal Medicine

## 2018-08-23 VITALS — BP 136/58 | HR 56 | Ht 70.0 in | Wt 226.0 lb

## 2018-08-23 DIAGNOSIS — K50813 Crohn's disease of both small and large intestine with fistula: Secondary | ICD-10-CM

## 2018-08-23 DIAGNOSIS — E538 Deficiency of other specified B group vitamins: Secondary | ICD-10-CM | POA: Diagnosis not present

## 2018-08-23 NOTE — Progress Notes (Signed)
Subjective:    Patient ID: [name], male    DOB: Apr 13, 1956, 62 y.o.   MRN: [id number]  HPI [name] is a 62 year old male with a past medical history of ileocolonic Crohn's disease diagnosed in 1985 status post right hemicolectomy with known history of Fistulizing and stricturing Crohn's disease, history of lysis of adhesions and left hemicolectomy, history of iron deficiency anemia, history of DVT, ankylosing spondylitis and cholelithiasis status post recent cholecystectomy who is here for follow-up.  He is here today with his cousin.  His Sister [name] his medical power of attorney.  Her phone number is [phone].  He was last seen on 05/20/2018.  He had his cholecystectomy and recovered very well.  He has moved to [facility] assisted living facility and this is been a very positive change for him.  He has continued Humira 40 mg every 14 days without problem.  He is using Lialda 2.4 g daily.  He reports he is feeling wonderfully.  No abdominal pain.  Stools are regular occurring every day to every other day.  No further diarrhea.  No blood in his stool or melena.  No issues with constipation.  No upper GI or hepatobiliary complaint.  Medication administration has been more consistent since moving living facilities.  He rarely if ever needs Zofran any longer.   Review of Systems As per HPI, otherwise negative  Current Medications, Allergies, Past Medical History, Past Surgical History, Family History and Social History were reviewed in [employer] record.     Objective:   Physical Exam BP (!) 136/58   Pulse (!) 56   Ht 5' 10"  (1.778 m)   Wt 226 lb (102.5 kg)   BMI 32.43 kg/m  Constitutional: Well-developed and well-nourished. No distress. HEENT: Normocephalic and atraumatic. Conjunctivae are normal.  No scleral icterus. Neck: Neck supple. Trachea midline. Cardiovascular: Normal rate, regular rhythm and intact distal pulses.    Pulmonary/chest: Effort normal and breath sounds normal. No wheezing, rales or rhonchi. Abdominal: Soft, nontender, nondistended. Bowel sounds active throughout. There are no masses palpable. No hepatosplenomegaly. Extremities: no clubbing, cyanosis, trace pretibial edema Neurological: Alert and oriented to person place and time. Skin: Skin is warm and dry. Psychiatric: Normal mood and affect. Behavior is normal.  CBC    Component Value Date/Time   WBC 6.8 06/06/2018 0655   RBC 4.23 06/06/2018 0655   HGB 13.4 06/06/2018 0655   HCT 41.5 06/06/2018 0655   PLT 338 06/06/2018 0655   MCV 98.1 06/06/2018 0655   MCH 31.7 06/06/2018 0655   MCHC 32.3 06/06/2018 0655   RDW 16.2 (H) 06/06/2018 0655   LYMPHSABS 0.8 06/06/2018 0655   MONOABS 0.9 06/06/2018 0655   EOSABS 0.0 06/06/2018 0655   BASOSABS 0.1 06/06/2018 0655   CMP     Component Value Date/Time   NA 139 06/06/2018 0655   K 4.6 06/06/2018 0655   CL 105 06/06/2018 0655   CO2 23 06/06/2018 0655   GLUCOSE 99 06/06/2018 0655   BUN 16 06/06/2018 0655   CREATININE 0.83 06/06/2018 0655   CREATININE 0.87 10/28/2014 1533   CALCIUM 8.8 (L) 06/06/2018 0655   PROT 7.4 06/06/2018 0655   ALBUMIN 3.2 (L) 06/06/2018 0655   AST 30 06/06/2018 0655   ALT 24 06/06/2018 0655   ALKPHOS 149 (H) 06/06/2018 0655   BILITOT 0.8 06/06/2018 0655   GFRNONAA >60 06/06/2018 0655   GFRAA >60 06/06/2018 0655      Assessment & Plan:  62 year old male with a past medical history of ileocolonic Crohn's disease diagnosed in 1985 status post right hemicolectomy with known history of Fistulizing and stricturing Crohn's disease, history of lysis of adhesions and left hemicolectomy, history of iron deficiency anemia, history of DVT, ankylosing spondylitis and cholelithiasis status post recent cholecystectomy who is here for follow-up.  1.  Crohn's ileocolitis/history of fistula and stricture --he is done very well recently and is receiving medications as  directed.  He is tolerating Humira 40 mg every 14 days and Lialda 2.4 g daily.  We will continue the doses of these 2 medications without change. --Clinically he is in remission --He has already received influenza vaccine and is up-to-date with Pneumovax  2.  Cholelithiasis with cholecystitis --recovered very well from cholecystectomy.  3.  History of iron deficiency anemia --previous IV iron.  Recent blood counts are normal.  Monitor blood counts and iron studies going forward.  May need periodic IV iron  4.  B12 deficiency --continue monthly IM B12  20-monthfollow-up with me, sooner if needed  25 minutes spent with the patient today. Greater than 50% was spent in counseling and coordination of care with the patient

## 2018-08-23 NOTE — Patient Instructions (Addendum)
Continue Humira every 14 days.  Continue Lialda 2.4 grams daily.  Please follow up with Dr Hilarie Fredrickson in 6 months.  Your provider has requested that you go to the basement level for lab work before leaving today. Press "B" on the elevator. The lab is located at the first door on the left as you exit the elevator.

## 2018-08-25 LAB — QUANTIFERON-TB GOLD PLUS
MITOGEN-NIL: 0.25 [IU]/mL
NIL: 0.01 IU/mL
QUANTIFERON-TB GOLD PLUS: UNDETERMINED — AB
TB1-NIL: 0 IU/mL
TB2-NIL: 0 [IU]/mL

## 2018-11-26 ENCOUNTER — Encounter: Payer: Self-pay | Admitting: Neurology

## 2018-11-26 ENCOUNTER — Ambulatory Visit (INDEPENDENT_AMBULATORY_CARE_PROVIDER_SITE_OTHER): Payer: Medicare Other | Admitting: Neurology

## 2018-11-26 ENCOUNTER — Other Ambulatory Visit: Payer: Self-pay

## 2018-11-26 VITALS — BP 118/60 | HR 72 | Ht 70.0 in | Wt 227.0 lb

## 2018-11-26 DIAGNOSIS — G40009 Localization-related (focal) (partial) idiopathic epilepsy and epileptic syndromes with seizures of localized onset, not intractable, without status epilepticus: Secondary | ICD-10-CM | POA: Diagnosis not present

## 2018-11-26 MED ORDER — PRIMIDONE 250 MG PO TABS: ORAL_TABLET | ORAL | 3 refills | Status: DC

## 2018-11-26 MED ORDER — LEVETIRACETAM 500 MG PO TABS: 500.0000 mg | ORAL_TABLET | Freq: Two times a day (BID) | ORAL | 3 refills | Status: DC

## 2018-11-26 NOTE — Patient Instructions (Signed)
Great seeing you! Continue Keppra 530m twice a day and Primidone 2539min AM, 50068mn PM. Follow-up in 1 year, call for any changes.  Seizure Precautions: 1. If medication has been prescribed for you to prevent seizures, take it exactly as directed.  Do not stop taking the medicine without talking to your doctor first, even if you have not had a seizure in a long time.   2. Avoid activities in which a seizure would cause danger to yourself or to others.  Don't operate dangerous machinery, swim alone, or climb in high or dangerous places, such as on ladders, roofs, or girders.  Do not drive unless your doctor says you may.  3. If you have any warning that you may have a seizure, lay down in a safe place where you can't hurt yourself.    4.  No driving for 6 months from last seizure, as per [facility] Please refer to the following link on the [employer] for more information: http://[email or web address].org/[email or web address]/driving/drivingu.cfm   5.  Maintain good sleep hygiene. Avoid alcohol  6.  Contact your doctor if you have any problems that may be related to the medicine you are taking.  7.  Call 911 and bring the patient back to the ED if:        A.  The seizure lasts longer than 5 minutes.       B.  The patient doesn't awaken shortly after the seizure  C.  The patient has new problems such as difficulty seeing, speaking or moving  D.  The patient was injured during the seizure  E.  The patient has a temperature over 102 F (39C)  F.  The patient vomited and now is having trouble breathing
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
## 2018-12-19 ENCOUNTER — Telehealth: Payer: Self-pay | Admitting: Internal Medicine

## 2018-12-19 NOTE — Telephone Encounter (Signed)
Pt nurse [name] from [facility] called wanting to speak with pt nurse about some orders and labs that were sent over to be reviewed. She will like a call back to discuss. thanks

## 2018-12-19 NOTE — Telephone Encounter (Signed)
Nurse from [facility] states she faxed over labs and facility md wanted to start pt on lisinopril and ferrous gluconate. Pts family wanted to run all this by [name] to see if he was ok with this. Dottie have you seen this come through the fax?

## 2018-12-19 NOTE — Telephone Encounter (Signed)
Left message for [name] to call back.

## 2018-12-19 NOTE — Telephone Encounter (Signed)
Spoke with [name] and requested she refax the results on [name].

## 2018-12-19 NOTE — Telephone Encounter (Signed)
I have not

## 2018-12-20 NOTE — Telephone Encounter (Signed)
Spoke with [name] and she is aware, verbal order given over the phone. She will call back when next labs are drawn.

## 2018-12-20 NOTE — Telephone Encounter (Signed)
Please see notes below and advise, records are in your inbox.

## 2018-12-20 NOTE — Telephone Encounter (Signed)
Labs reviewed Hemoglobin 12.3 MCV 95.2 on 12/16/2018 Hemoglobin 14.4, MCV 95.9 on 12/11/2018  --It is unclear to me why labs were checked 5 days apart.  CV is normal which would not be consistent with low iron --Rather than start oral iron which may cause change in bowel habit, I would recommend that they repeat CBC with ferritin plus IBC panel in 2 weeks --I am okay if he begins lisinopril per managing provider  Thanks JMP

## 2018-12-20 NOTE — Telephone Encounter (Signed)
[name] on the behalf of the pt called back to speak with linda.

## 2019-01-01 ENCOUNTER — Telehealth: Payer: Self-pay | Admitting: Internal Medicine

## 2019-01-01 NOTE — Telephone Encounter (Signed)
Pt has positive hemasure test today. 2 weeks ago the pt had a flare with 2 days of diarrhea and a temp. Facility PA wanted to know if [name] feels he needs to see the pt and if he may need a colon. Please advise.

## 2019-01-01 NOTE — Telephone Encounter (Signed)
[name], pt's nurse at assisted living called to inform that pt had a positive hemosure. They want to know if [name] would like to see pt in the office.

## 2019-01-02 NOTE — Telephone Encounter (Signed)
The positive Hemoccult in a patient with history of colitis is not surprising nor overly concerning That said if he has diarrhea with a temperature I would recommend stool studies be performed along with a CBC If he is having flare symptoms that are persistent then I should see him in clinic I would also recommend that just before his next Humira dose a Humira level with antibody be performed He should also continue Lialda which can be increased from 2.4 to 4.8 g a day if he is having colitis symptoms

## 2019-01-02 NOTE — Telephone Encounter (Signed)
Pt had the flare 2 weeks ago,he is not having diarrhea or running a temp now. Do you still want him to have stool studies etc.?

## 2019-01-03 NOTE — Telephone Encounter (Signed)
Spoke with [name] and she is aware and will communicate this to the pts family and the Facility PA.

## 2019-01-03 NOTE — Telephone Encounter (Signed)
No stool studies if diarrhea resolved

## 2019-01-13 ENCOUNTER — Telehealth: Payer: Self-pay | Admitting: *Deleted

## 2019-01-13 NOTE — Telephone Encounter (Signed)
I have spoken with [name], patient's POA (phone [phone]) to discuss patient's recent lab results and positive hemoccult results. Dr Hilarie Fredrickson indicated that patient "has borderline anemia. Iron level has decreased, ferritin low at 23. Would repeat IV iron. Feraheme (or last tolerated) x 1. Repeat CBC, ferritin 8 weeks after. JMP"    Unfortunately, due to COVID-19 pandemic, Dr Hilarie Fredrickson would like patient to hold off on coming to hospital setting to have IV iron as he is already immunocompromised being on biologics for IBD and he lives in an assisted living setting in which he could easily spread the virus should he contract it. At this time, we will hold off on scheduling patient for IV iron since this is not a life threatening problem and we will schedule IV iron infusion at a later time. [name] voices understanding.

## 2019-01-21 NOTE — Telephone Encounter (Signed)
I have spoken to [facility] [place] to advise that Dr Hilarie Fredrickson would like patient to have feraheme at hospital. He can safely wait 1-2 months. Cathy verbalizes understanding.(hgb on 12/30/18 13.7, hct 40.4, iron 40, ferritin 23.86)

## 2019-02-05 ENCOUNTER — Telehealth: Payer: Self-pay | Admitting: *Deleted

## 2019-02-05 NOTE — Telephone Encounter (Signed)
With very high Humira antibodies and low drug level, we need to change therapy as the medication is no longer effective for him.  It had been working very well Is communicate this with the patient and his sister, his healthcare POA  I would recommend that we begin Cimzia with standard induction and dosing.  This can start ASAP Virtual visit if possible in the next several weeks.

## 2019-02-05 NOTE — Telephone Encounter (Signed)
Dr Hilarie Fredrickson,  We received faxed results from [place]. 01/30/2019  Humira level result: <0.8 Humira antibody >100 (reference range <10)  Original faxed paperwork in inbox of your office. Wanted to give you a heads up since you are out of office and hospital doc this week.Marland KitchenMarland KitchenMarland Kitchen

## 2019-02-10 ENCOUNTER — Telehealth: Payer: Self-pay | Admitting: Neurology

## 2019-02-10 NOTE — Telephone Encounter (Signed)
Spoke with [name] at [facility] and she is aware.

## 2019-02-10 NOTE — Telephone Encounter (Signed)
Referral faxed to [facility] for Cimzia. Pt is due for next Humira injection this Thursday. [name] at [facility] wants to know if they need to give that injection or just wait until they receive the Cimzia. Please advise.

## 2019-02-10 NOTE — Telephone Encounter (Signed)
[name] from [facility] called regarding [name] and his family wanting to switch him from [facility] to another medication.  Please call to verify. His family does not want him to take the Humara due to the Side Effects of Seizures. She mentioned [name] at [facility] [phone] is where the order was coming from. Please Call. Thanks

## 2019-02-10 NOTE — Telephone Encounter (Signed)
Would not redose Humira, the medication will not be helpful given his very high antibody level Hopefully Cimzia can be approved quickly. Wallene Huh, rep, may be able to help with a starter kit if we anticipate approval soon

## 2019-02-11 NOTE — Telephone Encounter (Signed)
Orders faxed to [facility]

## 2019-02-11 NOTE — Telephone Encounter (Signed)
[name] at nursing home called regarding orders for Cimzia , she said that she did not receive the orders and is asking if they can be re-faxed to [phone].

## 2019-02-12 NOTE — Telephone Encounter (Signed)
Pls let [name] know that I reviewed notes, and it looks like [name] does agree with switching from Humira to another medication. So the family does not want him taking Humira? [name] wants to switch him anyway, not quite sure I understand the question. Thanks

## 2019-02-12 NOTE — Telephone Encounter (Signed)
Spoke with [name] from [facility] she stated that family just wanted you to be informed that Dr.Pyrtle was going to make the change. She stated that she would inform the family that you had reviewed Dr Vena Rua notes

## 2019-02-13 NOTE — Telephone Encounter (Signed)
Pt has been approved for Cimzia at no cost.

## 2019-05-05 ENCOUNTER — Encounter: Payer: Self-pay | Admitting: *Deleted

## 2019-05-06 ENCOUNTER — Telehealth: Payer: Self-pay | Admitting: *Deleted

## 2019-05-06 ENCOUNTER — Encounter: Payer: Self-pay | Admitting: Internal Medicine

## 2019-05-06 ENCOUNTER — Ambulatory Visit (INDEPENDENT_AMBULATORY_CARE_PROVIDER_SITE_OTHER): Payer: Medicare Other | Admitting: Internal Medicine

## 2019-05-06 VITALS — Ht 70.0 in | Wt 230.0 lb

## 2019-05-06 DIAGNOSIS — Z79899 Other long term (current) drug therapy: Secondary | ICD-10-CM | POA: Diagnosis not present

## 2019-05-06 DIAGNOSIS — K50813 Crohn's disease of both small and large intestine with fistula: Secondary | ICD-10-CM | POA: Diagnosis not present

## 2019-05-06 DIAGNOSIS — E538 Deficiency of other specified B group vitamins: Secondary | ICD-10-CM | POA: Diagnosis not present

## 2019-05-06 DIAGNOSIS — Z8639 Personal history of other endocrine, nutritional and metabolic disease: Secondary | ICD-10-CM | POA: Diagnosis not present

## 2019-05-06 NOTE — Progress Notes (Signed)
Subjective:    Patient ID: [name], male    DOB: Dec 18, 1955, 63 y.o.   MRN: [id number]  This service was provided via telemedicine.  Doximity application with A/V communication The patient was located at his home and [facility] facility The provider was located in provider's GI office. The patient did consent to this telephone visit and is aware of possible charges through their insurance for this visit.   The persons participating in this telemedicine service were the patient, his nurse [name], and I. Time spent on call: 14 minutes   HPI [name] is a 63 year old male with a past medical history of ileocolonic fistulizing and stricturing Crohn's disease diagnosed in 1985 with a history of right hemicolectomy, history of lysis of adhesions and left hemicolectomy, history of iron deficiency anemia, history of cholelithiasis status post cholecystectomy, history of ankylosing spondylitis, history of DVT who is seen for follow-up.  He is seen virtually today through the Doximity app with A/V communication in the setting of COVID-19.  He was last seen in the office on 08/23/2018.  Since his office visit he was noted to have a heme positive stool and there was concern about possible activity of his Crohn's disease.  We checked a Humira level and antibody and his Humira level was undetectable with an antibody level greater than 100.  Humira was then discontinued and he was switched to Cimzia starting in April 2020.  This is been going well and he reports he is feeling good.  His next Cimzia dose is tomorrow 05/07/2019.  He is tolerating the injections well with no injection site reaction.  He is also taking Lialda 2.4 g daily.  No abdominal pain.  Bowel movements have been regular with 1 bowel movement daily.  Very rarely will he have diarrhea.  No visible blood in his stool or melena.  No rectal pain or tenesmus symptom.  He is eating well and denies heartburn, dysphagia and odynophagia.  No  weight loss.  He has been able to avoid COVID-19 despite there having been an outbreak at his living facility.  His ferritin was 23 when checked earlier and he has received IV iron. His last PPD was in June 2019 and he had an indeterminate QuantiFERON gold in November 2019. He had a B12 performed at his nursing facility in January 2020 and level was 665  His last colonoscopy was in October 2018 and we reviewed this today.  Review of Systems As per HPI, otherwise negative  Current Medications, Allergies, Past Medical History, Past Surgical History, Family History and Social History were reviewed in [employer] record.     Objective:   Physical Exam No physical exam, virtual visit  Labs reviewed in the medical record and with his nurse [name] today.     Assessment & Plan:  63 year old male with a past medical history of ileocolonic fistulizing and stricturing Crohn's disease diagnosed in 1985 with a history of right hemicolectomy, history of lysis of adhesions and left hemicolectomy, history of iron deficiency anemia, history of cholelithiasis status post cholecystectomy, history of ankylosing spondylitis, history of DVT who is seen for follow-up.   1.  Crohn's ileocolitis with history of fistula and stricture --he developed Humira antibodies and has been switched to Cimzia.  He is tolerating this well.  He continues on Lialda 2.4 g daily and currently has achieved clinical remission. --Continue Cimzia 400 mg every 4 weeks; next dose 05/07/2019 --Continue Lialda 2.4 g daily --I  asked that [name], his nurse, place a PPD at this time and she will do so and fax [place] the results --I also requested and gave her verbal order for CBC, ferritin, IBC panel and CMP --He will need influenza vaccine when it is available in the next month or 2 --Surveillance colonoscopy would be October 2020 but this will likely be delayed given the COVID-19 pandemic.  I explained this to him today and  will plan to repeat colonoscopy in October 2021, please change recall  2.  History of IDA --previously received IV iron.  Rechecking iron studies and blood counts today  3.  B12 deficiency --normal B12 level in January, continue monthly IM B12 which he receives on schedule  51-monthfollow-up, sooner if needed

## 2019-05-06 NOTE — Telephone Encounter (Signed)
I have spoken to patient's POA, Cathy to advise of Dr Vena Rua recommendations from office visit today. In addition, I have mailed her a copy of those same recommendations to her home address in [place] since patient is in an assisted living facility and she has been unable to have anyone accompany him for office visit etc due to COVID-19 lockdown.   I have also sent office note from today to Rodey, RN with [facility]. Fax (Nurse [phone]; Main fax [phone]); Phone number [phone].

## 2019-05-06 NOTE — Patient Instructions (Addendum)
Continue Cimzia 400 mg every 4 weeks; next dose 05/07/2019  Continue Lialda 2.4 g daily.  After having your PPD placed and read, please fax results at [phone].  We also request that [place] draw a CBC, ferritin, IBC panel and CMP. Please fax those results as well to [phone].  You will need influenza vaccine when it is available in the next month or two.  Surveillance colonoscopy would be October 2020 but this will likely be delayed given the COVID-19 pandemic.  With this information, we will plan to repeat colonoscopy in October 2021.  Continue intramuscular B12 which you already receive on a monthly schedule.  Please follow up with Dr Hilarie Fredrickson in 45-month, sooner if needed.

## 2019-05-08 ENCOUNTER — Encounter: Payer: Self-pay | Admitting: Internal Medicine

## 2019-05-23 ENCOUNTER — Telehealth: Payer: Self-pay | Admitting: *Deleted

## 2019-05-23 NOTE — Telephone Encounter (Addendum)
Dr Hilarie Fredrickson has received fax from [facility] with CMP, CBC and IBC results from 05/08/19. Dr Hilarie Fredrickson notes the following: "He remains anemic. Iron studies are low, ferritin wasn't done (that I can see). IV iron needed, if possible. Some restrictions given he lives in facility in time of COVID-19. Might need COVID test before IV iron. JMP"  I have contacted [facility] nursing facility and spoke to [place], nurse on for evening who indicates that [place], the daytime nurse and facility administrator would need to assist with this. She states that she will leave a message for [name] to call me back.

## 2019-05-26 ENCOUNTER — Other Ambulatory Visit: Payer: Self-pay | Admitting: Internal Medicine

## 2019-05-26 ENCOUNTER — Telehealth: Payer: Self-pay | Admitting: Internal Medicine

## 2019-05-26 DIAGNOSIS — D509 Iron deficiency anemia, unspecified: Secondary | ICD-10-CM

## 2019-05-26 NOTE — Telephone Encounter (Signed)
Patient has been scheduled for IV feraheme on 05/27/19 at [facility] and 06/03/19 at 9am at the [facility] (feraheme x 2 doses verbal order given by Dr Hilarie Fredrickson).   I have spoken to Caryl Asp, Therapist, sports at [facility] to advise of times/dates/locations of these appointments. I have also advised [name], patient's POA of these iron infusions and I have advised patient's cousin, [name] who will be transporting patient to appointments. All of these individuals verbalize understanding and are in agreement of these appointments and procedure.  Per G[place] (scheduled feraheme), patient's cousin [name] can accompany patient to feraheme appointment and will just need to advise whomever is at the front door that due to special circumstances, she needs to be with him for appointment.

## 2019-05-26 NOTE — Telephone Encounter (Signed)
See 05/23/19 telephone note.

## 2019-05-27 ENCOUNTER — Other Ambulatory Visit: Payer: Self-pay

## 2019-05-27 ENCOUNTER — Ambulatory Visit (HOSPITAL_COMMUNITY)
Admission: RE | Admit: 2019-05-27 | Discharge: 2019-05-27 | Disposition: A | Discharge status: Home / Self Care | Payer: Medicare Other | Source: Ambulatory Visit | Attending: Internal Medicine | Admitting: Internal Medicine

## 2019-05-27 DIAGNOSIS — D509 Iron deficiency anemia, unspecified: Secondary | ICD-10-CM | POA: Diagnosis present

## 2019-05-27 MED ORDER — SODIUM CHLORIDE 0.9 % IV SOLN
510.0000 mg | INTRAVENOUS | Status: DC
  Administered 2019-05-27: 510 mg via INTRAVENOUS
  Filled 2019-05-27: qty 17

## 2019-05-27 MED ORDER — SODIUM CHLORIDE 0.9 % IV SOLN
INTRAVENOUS | Status: DC | PRN
  Administered 2019-05-27: 250 mL via INTRAVENOUS

## 2019-05-27 NOTE — Progress Notes (Signed)
Patient received Feraheme via PIV. Observed for at least 30 minutes post infusion.Tolerated well, vitals stable, discharge instructions given, verbalized understanding. Patient alert, oriented and ambulatory at the time of discharge.  

## 2019-05-27 NOTE — Discharge Instructions (Signed)

## 2019-06-03 ENCOUNTER — Ambulatory Visit (HOSPITAL_COMMUNITY)
Admission: RE | Admit: 2019-06-03 | Discharge: 2019-06-03 | Disposition: A | Discharge status: Home / Self Care | Payer: Medicare Other | Source: Ambulatory Visit | Attending: Internal Medicine | Admitting: Internal Medicine

## 2019-06-03 ENCOUNTER — Other Ambulatory Visit: Payer: Self-pay

## 2019-06-03 DIAGNOSIS — D509 Iron deficiency anemia, unspecified: Secondary | ICD-10-CM | POA: Diagnosis not present

## 2019-06-03 MED ORDER — SODIUM CHLORIDE 0.9 % IV SOLN
510.0000 mg | Freq: Once | INTRAVENOUS | Status: AC
  Administered 2019-06-03: 510 mg via INTRAVENOUS
  Filled 2019-06-03: qty 17

## 2019-06-03 MED ORDER — SODIUM CHLORIDE 0.9 % IV SOLN
INTRAVENOUS | Status: DC | PRN
  Administered 2019-06-03: 250 mL via INTRAVENOUS

## 2019-06-03 NOTE — Discharge Instructions (Signed)

## 2019-06-03 NOTE — Progress Notes (Signed)
Patient received Feraheme via PIV. Observed for at least 30 minutes post infusion.Tolerated well, vitals stable, discharge instructions given, verbalized understanding. Patient alert, oriented and ambulatory at the time of discharge.  

## 2019-07-10 ENCOUNTER — Telehealth: Payer: Self-pay | Admitting: Internal Medicine

## 2019-07-10 NOTE — Telephone Encounter (Signed)
Records are here and on Dr Vena Rua desk for review

## 2019-07-10 NOTE — Telephone Encounter (Signed)
Dottie see note below. Have you seen anything come via fax on this pt?

## 2019-07-11 NOTE — Telephone Encounter (Signed)
I have spoken to Caryl Asp, RN to advise of Dr Vena Rua interpretation of labs and nursing notes as well as his current recommendations. She verbalizes understanding. She indicates that at the request of family and recommendation of primary care (Dr Hilarie Fredrickson was out of the office for a while and was unable to advise), patient has already been started on lasix 20 mg every other day and an ECHO has been ordered. They are checking with Medicaid to see if cardiology would be covered. I advised Ms.[name] that I would fax our information to her at fax [phone].

## 2019-07-11 NOTE — Telephone Encounter (Signed)
I have reviewed the patient's lab work. Basic metabolic panel from 12/27/6281 within normal limits except for mildly low carbon dioxide at 18.  Creatinine is 1.16.  Potassium back to normal at 5.0 CMP from 06/19/2019 potassium was slightly elevated at 5.4.  Creatinine was 1.25.  Albumin 3.1 AST 20, ALT 13, total bilirubin less than 0.2, alkaline phosphatase 119 Hemoglobin improved from 10.6-12.0, white count normal 4.5, platelet count normal 197 Vitamin D 34.5  Nursing notes that he has increase in his bilateral lower extremity edema.  He started Cimzia in April 2020 Blood pressures look good ranging from 151-761 systolic over 60-73 diastolic.  Pulse ranges 64-73 Weight up 10 pounds  I do not think lower extremity edema is related to Cimzia.  Heart failure is possible with anti-TNF therapy but unlikely. I would recommend he be evaluated by primary care who can consider diuretic therapy. Chest x-ray to rule out pulmonary edema also reasonable  Would continue Cimzia on current dosing schedule for now

## 2019-07-22 ENCOUNTER — Telehealth: Payer: Self-pay | Admitting: *Deleted

## 2019-07-22 DIAGNOSIS — D509 Iron deficiency anemia, unspecified: Secondary | ICD-10-CM

## 2019-07-22 DIAGNOSIS — K50818 Crohn's disease of both small and large intestine with other complication: Secondary | ICD-10-CM

## 2019-07-22 NOTE — Telephone Encounter (Signed)
I have spoken to Caryl Asp, Therapist, sports at [facility] [employer] number [phone]). She asks that we fax orders over to [phone]. This has been done. She is also been given a verbal order as well. She indicates they will draw labs on 07/29/19 as requested.

## 2019-07-22 NOTE — Telephone Encounter (Signed)
-----   Message from Larina Bras, [place] sent at 05/26/2019 10:59 AM EDT ----- Needs to have repeat CBC, IBC, Ferritin around 07/29/19. Call [facility] and order

## 2019-07-24 ENCOUNTER — Ambulatory Visit: Payer: Self-pay | Admitting: Cardiology

## 2019-07-30 ENCOUNTER — Other Ambulatory Visit: Payer: Self-pay

## 2019-07-30 ENCOUNTER — Encounter: Payer: Self-pay | Admitting: Cardiology

## 2019-07-30 ENCOUNTER — Ambulatory Visit: Payer: Self-pay | Admitting: Cardiology

## 2019-07-30 ENCOUNTER — Telehealth (INDEPENDENT_AMBULATORY_CARE_PROVIDER_SITE_OTHER): Payer: Medicare Other | Admitting: Cardiology

## 2019-07-30 VITALS — BP 111/58 | HR 68 | Ht 72.0 in | Wt 243.0 lb

## 2019-07-30 DIAGNOSIS — I350 Nonrheumatic aortic (valve) stenosis: Secondary | ICD-10-CM | POA: Diagnosis not present

## 2019-07-30 DIAGNOSIS — R600 Localized edema: Secondary | ICD-10-CM | POA: Diagnosis not present

## 2019-07-30 NOTE — Progress Notes (Signed)
Subjective:   [name], male    DOB: 1956-07-09, 63 y.o.   MRN: [id number]   I connected with the patient on 07/30/19 by a video enabled telemedicine application and verified that I am speaking with the correct person using two identifiers.     I discussed the limitations of evaluation and management by telemedicine and the availability of in person appointments. The patient expressed understanding and agreed to proceed.   This visit type was conducted due to national recommendations for restrictions regarding the COVID-19 Pandemic (e.g. social distancing).  This format is felt to be most appropriate for this patient at this time.  All issues noted in this document were discussed and addressed.  No physical exam was performed (except for noted visual exam findings with Tele health visits).  The patient has consented to conduct a Tele health visit and understands insurance will be billed.     Chief complaint:  Leg swelling  HPI  63 y/o Caucasian male, referred for evaluation of mild aortic stenosis, systolic murmur. Nurse [name] present with the patient, sister [name] on speaker phone.  Patient has complex medical history including mental disability, seizure disorder, ileocolonic visualizing stricturing Crohn's disease, status post right hemicolectomy, history of lysis of lesions and left hemicolectomy, and deficiency anemia, cholelithiasis status post cholecystectomy, history of ankylosing spondylitis, history of DVT-currently not on anticoagulation.  Patient has been living at assisting living facility since June 2019, when her caretaker mother passed. He has noticed leg swelling and weight gain in the last few weeks. He currently takes lasix 20 mg every other day, without much improvement. He recently had echocardiogram that showed mild AS, mild AR. Report available, but images not available to me.    Past Medical History:  Diagnosis Date  . Abscess of anal and rectal regions   .  Allergic rhinitis   . Aortic valve insufficiency    non-rheumatic   . BPH (benign prostatic hyperplasia)   . CHF (congestive heart failure) (HCC)     chronic diastolic heart failure   . Cholelithiasis   . Crohn's disease ([place])   . Dizziness and giddiness   . DVT (deep venous thrombosis) ([place])   . Dysphagia   . Epilepsy ([place])    "petit mal; only at home" (6/9//2015)  . Heart murmur   . Hepatomegaly   . History of blood transfusion ~ 1992   "related to my Crohn's"  . History of immunosuppression therapy   . History of stomach ulcers   . Hyperplastic colon polyp   . Hypokalemia    hx of   . Iron deficiency anemia, unspecified    iron deficiency anemia   . Malaise   . Mental disability   . Muscle weakness (generalized)   . Other B-complex deficiencies   . REGIONAL ENTERITIS, LARGE INTESTINE 10/05/2000   Qualifier: Diagnosis of  By: Jerral Ralph    . S/P partial colectomy for Crohn's disease 12/01/2015  . SBO (small bowel obstruction) ([place]) 12/01/2015  . Sepsis ([place])    hx of   . Severe protein-calorie malnutrition Altamease Oiler: less than 60% of standard weight) ([place])   . Syncope 03/31/2014  . Syncope and collapse 03/31/2014   "while driving"  . Tremor   . Unsteadiness on feet   . Urticaria   . Vitamin D deficiency      Past Surgical History:  Procedure Laterality Date  . CHOLECYSTECTOMY N/A 06/06/2018   Procedure: LAPAROSCOPIC CHOLECYSTECTOMY WITH INTRAOPERATIVE CHOLANGIOGRAM ERAS PATHWAY;  Surgeon: Alphonsa Overall, MD;  Location: [facility];  Service: General;  Laterality: N/A;  . COLON SURGERY     Anastomosis 45cm from anal verge c/w deswecending colon anastomosis  . COLONOSCOPY  2013, 2016  . COLOSTOMY TAKEDOWN N/A 08/01/2016   Procedure: LAPAROSCOPIC COLOSTOMY REVERSAL;  Surgeon: Alphonsa Overall, MD;  Location: [facility];  Service: General;  Laterality: N/A;  . [place]   ileocectomy   . HERNIA REPAIR    . LAPAROSCOPY N/A 12/04/2015   Procedure: LAPAROSCOPY  DIAGNOSTIC, LAPAROSCOPIC INTEROLYSIS OF ADHESIONS, HAND- ASSISTED SMALL BOWEL RESECTION, MOBILIZATION OF SPLENIC FLEXURE, LEFT COLECTOMY WITH CREATION OF COLOSTOMY (HARTMAN'S PROCEDURE);  Surgeon: Alphonsa Overall, MD;  Location: [facility];  Service: General;  Laterality: N/A;  . PILONIDAL CYST EXCISION  1980's  . TONSILLECTOMY  ~ 1967  . UMBILICAL HERNIA REPAIR  ~ 1983     Social History   Socioeconomic History  . Marital status: Single    Spouse name: Not on file  . Number of children: 0  . Years of education: Not on file  . Highest education level: Not on file  Occupational History  . Occupation: Disabled  Social Needs  . Financial resource strain: Not on file  . Food insecurity    Worry: Not on file    Inability: Not on file  . Transportation needs    Medical: Not on file    Non-medical: Not on file  Tobacco Use  . Smoking status: Never Smoker  . Smokeless tobacco: Never Used  Substance and Sexual Activity  . Alcohol use: No    Alcohol/week: 0.0 standard drinks  . Drug use: No  . Sexual activity: Never  Lifestyle  . Physical activity    Days per week: Not on file    Minutes per session: Not on file  . Stress: Not on file  Relationships  . Social Herbalist on phone: Not on file    Gets together: Not on file    Attends religious service: Not on file    Active member of club or organization: Not on file    Attends meetings of clubs or organizations: Not on file    Relationship status: Not on file  . Intimate partner violence    Fear of current or ex partner: Not on file    Emotionally abused: Not on file    Physically abused: Not on file    Forced sexual activity: Not on file  Other Topics Concern  . Not on file  Social History Narrative   Lives at [facility]     Family History  Problem Relation Age of Onset  . Skin cancer Father   . Colon cancer Mother   . Seizures Neg Hx   . Crohn's disease Neg Hx      Current Outpatient Medications on File  Prior to Visit  Medication Sig Dispense Refill  . acetaminophen (TYLENOL) 325 MG tablet Take 325 mg by mouth every 6 (six) hours as needed.    [name] acetaminophen (TYLENOL) 500 MG tablet Take 500 mg by mouth at bedtime.    [name] aspirin EC 81 MG tablet Take 81 mg by mouth daily.    . Certolizumab Pegol (CIMZIA PREFILLED) 2 X 200 MG/ML KIT Inject 400 mg into the skin every 30 (thirty) days.     . Cyanocobalamin (VITAMIN B-12 IJ) Inject 1 mL as directed every 30 (thirty) days.    . famotidine (PEPCID) 20 MG tablet Take 20 mg by  mouth 2 (two) times daily.    . ferrous sulfate 325 (65 FE) MG tablet Take 325 mg by mouth daily with breakfast.    . furosemide (LASIX) 20 MG tablet Take 1 tablet by mouth every other day.    . guaifenesin (ROBITUSSIN) 100 MG/5ML syrup Take 200 mg by mouth every 6 (six) hours as needed for cough (Do not exceed 4 doses in 24 hours. If cough has not improved in 24 hours notify physician.).    [name] ipratropium (ATROVENT) 0.03 % nasal spray Place 1 spray into both nostrils 2 (two) times daily.     [name] levETIRAcetam (KEPPRA) 500 MG tablet Take 1 tablet (500 mg total) by mouth 2 (two) times daily. 180 tablet 3  . lisinopril (ZESTRIL) 5 MG tablet Take 1 tablet by mouth daily.    [name] loratadine (CLARITIN) 10 MG tablet Take 10 mg by mouth daily.    . mesalamine (LIALDA) 1.2 g EC tablet Take 2 tablets (2.4 g total) by mouth daily with breakfast. 60 tablet 5  . Multiple Vitamins-Iron (MULTIVITAMINS WITH IRON) TABS tablet Take 1 tablet by mouth daily.    . ondansetron (ZOFRAN) 4 MG tablet Take 4 mg by mouth every 6 (six) hours as needed for nausea or vomiting.    [name] PAZEO 0.7 % SOLN Place 1 drop into both eyes daily as needed (irritation).     . primidone (MYSOLINE) 250 MG tablet Take 1 tab in AM, 2 tabs in PM 270 tablet 3  . tamsulosin (FLOMAX) 0.4 MG CAPS capsule Take 1 capsule (0.4 mg total) by mouth daily. 30 capsule 30  . triamcinolone cream (KENALOG) 0.1 % Apply 1 application topically as  directed.    . Vitamin D, Ergocalciferol, (DRISDOL) 1.25 MG (50000 UT) CAPS capsule Take 50,000 Units by mouth every 7 (seven) days.     No current facility-administered medications on file prior to visit.     Cardiovascular studies:  Echocardiogram 07/11/2019 (Outside study): Mild left ventricular dilatation.  EF 55-60%.  Normal diastolic function. Aortic valve sclerosis with mild stenosis by gradients, but accurate AVA not calculated.  Mild aortic valve regurgitation.  Echocardiogram 2017: - Left ventricle: The cavity size was mildly dilated. Systolic   function was normal. The estimated ejection fraction was in the   range of 50% to 55%. Features are consistent with a pseudonormal   left ventricular filling pattern, with concomitant abnormal   relaxation and increased filling pressure (grade 2 diastolic   dysfunction). Doppler parameters are consistent with elevated   ventricular end-diastolic filling pressure. - Aortic valve: There was very mild stenosis. There was moderate   regurgitation. Peak velocity (S): 228 cm/s. Mean gradient (S): 11   mm Hg. Regurgitation pressure half-time: 393 ms. - Mitral valve: There was no evidence for stenosis. There was no   regurgitation. - Right ventricle: The cavity size was normal. Wall thickness was   normal. Systolic function was normal. - Pericardium, extracardiac: A trivial pericardial effusion was   identified. There was no evidence of hemodynamic compromise.  Vascular US 07/2016: - Findings consistent with acute occlusive deep vein thrombosis   involving the popliteal, proximal profunda, femoral, and common   femoral veins of the left lower extremity. Posterior tibial and   peroneal veins appear patent. - Findings also consistent with superficial thrombosis of the   proximal greater saphenous vein into the saphenofemoral junction   of the left lower extremity. - No evidence of deep vein thrombosis involving the right lower  extremity. - Unable to visualize the right peroneal vein well enough to fully   evaluate. - No evidence of superficial thrombosis involving the right lower   extremity. - No evidence of Baker&'s cyst on the right or left.  Recent labs: 07/28/2019: Glucose 125.  BUN/creatinine 24.9/1.25.  EGFR 59.  Sodium 140, potassium 5.0. H/H 13.4/40.6.  MCV 94.  Platelets 271. NT proBNP 238, mildly elevated. Iron 78 normal.  TIBC 177-.  Transferrin saturation 14% normal.   07/10/2019: Glucose 93.  BUN/creatinine 22/1.13.  EGFR 67.  Sodium 138, potassium 5.1.  Total protein 5.9, albumin 3.3-49 mL. Cholesterol 129, triglycerides 240, HDL 35, LDL 46.  06/19/2019: Glucose 91.  BUN/creatinine 23/1.25.  EGFR 60.  Sodium 139, potassium 4.4.  Total protein 5.5, albumin 3.1, both mildly reduced. H/H 12.7/37.8.  MCV 95.  Platelets 196.   Review of Systems  Constitution: Positive for weight gain. Negative for decreased appetite, malaise/fatigue and weight loss.  HENT: Negative for congestion.   Eyes: Negative for visual disturbance.  Cardiovascular: Positive for leg swelling. Negative for chest pain, dyspnea on exertion, palpitations and syncope.  Respiratory: Negative for cough.   Endocrine: Negative for cold intolerance.  Hematologic/Lymphatic: Does not bruise/bleed easily.  Skin: Negative for itching and rash.  Musculoskeletal: Negative for myalgias.  Gastrointestinal: Negative for abdominal pain, nausea and vomiting.  Genitourinary: Negative for dysuria.  Neurological: Negative for dizziness and weakness.  Psychiatric/Behavioral: The patient is not nervous/anxious.   All other systems reviewed and are negative.        Vitals:   07/30/19 1354  BP: (!) 111/58  [name]: 68   (Measured by the patient using a home BP monitor)  Body mass index is 32.96 kg/m. Filed Weights   07/30/19 1354  Weight: 243 lb (110.2 kg)     Observation/findings during video visit   Objective:    Physical  Exam  Constitutional: He is oriented to person, place, and time. He appears well-developed and well-nourished. No distress.  Neck: No JVD present.  Pulmonary/Chest: Effort normal.  Musculoskeletal:        General: Edema (Trace b/l) present.  Neurological: He is alert and oriented to person, place, and time.  Psychiatric: He has a normal mood and affect.  Nursing note and vitals reviewed.         Assessment & Recommendations:   63 y/o Caucasian male, referred for evaluation of mild aortic stenosis, systolic murmur, leg swelling, weight gain. Complex medical history including mental disability, seizure disorder, ileocolonic visualizing stricturing Crohn's disease, status post right hemicolectomy, history of lysis of lesions and left hemicolectomy, and deficiency anemia, cholelithiasis status post cholecystectomy, history of ankylosing spondylitis, history of DVT-currently not on anticoagulation.  Leg swelling: Echocardiogram report and physical exam are underwhelming. Recommend increasing lasix to 20 mg daily. I  will try to obtain echocardiogram images. Recommend regular weight checks.  Follow up in 4 weeks.   Nigel Mormon, MD [facility] Pager: [phone] Office: [phone] If no answer Cell [phone]

## 2019-08-07 NOTE — Telephone Encounter (Signed)
Dr Hilarie Fredrickson has reviewed labs and indicates: "CBC looks better. Hemoglobin @ 13.4. Iron good. Continue current medications. Repeat CBC, CMP, ferritin in 3 months.- JMP"  I have contacted patient's POA, Cathy Gallardy to advise of this. I have also spoken to Caryl Asp, RN at [facility] to advise of this. She verbalizes understanding. More labs will be drawn in 3 months.

## 2019-08-27 ENCOUNTER — Ambulatory Visit (INDEPENDENT_AMBULATORY_CARE_PROVIDER_SITE_OTHER): Payer: Medicare Other | Admitting: Cardiology

## 2019-08-27 ENCOUNTER — Encounter: Payer: Self-pay | Admitting: Cardiology

## 2019-08-27 ENCOUNTER — Other Ambulatory Visit: Payer: Self-pay

## 2019-08-27 VITALS — BP 115/34 | HR 75 | Temp 98.0°F | Ht 70.0 in | Wt 242.0 lb

## 2019-08-27 DIAGNOSIS — I351 Nonrheumatic aortic (valve) insufficiency: Secondary | ICD-10-CM

## 2019-08-27 DIAGNOSIS — I350 Nonrheumatic aortic (valve) stenosis: Secondary | ICD-10-CM | POA: Diagnosis not present
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
## 2019-10-23 ENCOUNTER — Telehealth: Payer: Self-pay | Admitting: *Deleted

## 2019-10-23 DIAGNOSIS — K50818 Crohn's disease of both small and large intestine with other complication: Secondary | ICD-10-CM

## 2019-10-23 DIAGNOSIS — D509 Iron deficiency anemia, unspecified: Secondary | ICD-10-CM

## 2019-10-23 NOTE — Telephone Encounter (Signed)
I have spoken to [place] (Camera operator) at [facility] to give verbal orders for patient to have CBC, CMP and ferritin drawn on 10/28/19. I have also faxed orders to fax number [phone]. She verbalizes understanding and indicates she will get this done for [place].

## 2019-10-23 NOTE — Telephone Encounter (Signed)
-----   Message from Larina Bras, [place] sent at 08/07/2019 11:55 AM EDT ----- Needs cbc, cmp, ferritin drawn around 10/28/2019. See 07/22/19 telephone note. Contact [name] at [facility] to advise of this.Marland KitchenMarland KitchenMarland Kitchen

## 2019-10-31 NOTE — Telephone Encounter (Signed)
Definitely okay for COVID-19 vaccination

## 2019-10-31 NOTE — Telephone Encounter (Signed)
I have spoken to Caryl Asp, RN at [facility] who indicates that they actually had patient written down to have labs completed this upcoming Monday. I advised that this should still be fine. She will fax results when they come back. In addition, Caryl Asp indicates that patient is set to have [place] vaccination on Monday as well.  She wants to make sure Dr Hilarie Fredrickson is okay with this since he is on biologics. Dr Hilarie Fredrickson, please advise.Marland KitchenMarland Kitchen

## 2019-10-31 NOTE — Telephone Encounter (Signed)
I have spoken to Caryl Asp, RN at [facility] to advise that per Dr Hilarie Fredrickson, patient is fine to have COVID-19 vaccination. She verbalizes understanding.

## 2019-11-24 NOTE — Progress Notes (Signed)
Spoke to [place]

## 2019-11-25 ENCOUNTER — Telehealth (INDEPENDENT_AMBULATORY_CARE_PROVIDER_SITE_OTHER): Payer: Medicare Other | Admitting: Neurology

## 2019-11-25 ENCOUNTER — Other Ambulatory Visit: Payer: Self-pay

## 2019-11-25 ENCOUNTER — Encounter: Payer: Self-pay | Admitting: Neurology

## 2019-11-25 DIAGNOSIS — G40009 Localization-related (focal) (partial) idiopathic epilepsy and epileptic syndromes with seizures of localized onset, not intractable, without status epilepticus: Secondary | ICD-10-CM
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
## 2020-01-05 ENCOUNTER — Telehealth: Payer: Self-pay | Admitting: Neurology

## 2020-01-05 NOTE — Telephone Encounter (Signed)
Spoke with [name] informed her that we did get [name]'s lab results

## 2020-01-05 NOTE — Telephone Encounter (Signed)
[name] called regarding patient and needing to see if our office received labs that were faxed over? Please Call. Thank you

## 2020-01-26 ENCOUNTER — Encounter: Payer: Self-pay | Admitting: Cardiology

## 2020-01-26 ENCOUNTER — Ambulatory Visit: Payer: Medicare Other | Admitting: Cardiology

## 2020-01-26 ENCOUNTER — Other Ambulatory Visit: Payer: Self-pay

## 2020-01-26 VITALS — BP 116/54 | HR 66 | Temp 98.0°F | Resp 17 | Ht 72.0 in | Wt 244.0 lb

## 2020-01-26 DIAGNOSIS — R600 Localized edema: Secondary | ICD-10-CM

## 2020-01-26 DIAGNOSIS — I351 Nonrheumatic aortic (valve) insufficiency: Secondary | ICD-10-CM

## 2020-01-26 DIAGNOSIS — I350 Nonrheumatic aortic (valve) stenosis: Secondary | ICD-10-CM

## 2020-01-26 MED ORDER — FUROSEMIDE 20 MG PO TABS: 20.0000 mg | ORAL_TABLET | Freq: Two times a day (BID) | ORAL | 2 refills | Status: DC

## 2020-01-26 MED ORDER — FUROSEMIDE 20 MG PO TABS: 20.0000 mg | ORAL_TABLET | ORAL | 2 refills | Status: DC | PRN

## 2020-01-26 NOTE — Progress Notes (Signed)
Subjective:   [name], male    DOB: 04-04-1956, 64 y.o.   MRN: [id number]   Chief complaint:  Leg swelling  HPI  64 y/o Caucasian male with mild aortic stenosis and regurgitation, multiple medical comorbidities.  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  Patient is here today with his cousin Vaughan Basta. He has not been compliant with wearing compression stockings regularly. He has noticed increased swelling in both his legs. He denies chest pain, shortness of breath, palpitations, orthopnea, PND, TIA/syncope.   Current Outpatient Medications on File Prior to Visit  Medication Sig Dispense Refill  . acetaminophen (TYLENOL) 325 MG tablet Take 325 mg by mouth every 6 (six) hours as needed.    [name] acetaminophen (TYLENOL) 500 MG tablet Take 500 mg by mouth at bedtime.    . Certolizumab Pegol (CIMZIA PREFILLED) 2 X 200 MG/ML KIT Inject 400 mg into the skin every 30 (thirty) days.     . Cyanocobalamin (VITAMIN B-12 IJ) Inject 1 mL as directed every 30 (thirty) days.    . famotidine (PEPCID) 20 MG tablet Take 20 mg by mouth 2 (two) times daily.    . ferrous sulfate 325 (65 FE) MG tablet Take 325 mg by mouth daily with breakfast.    . furosemide (LASIX) 20 MG tablet Take 1 tablet by mouth daily.     . furosemide (LASIX) 20 MG tablet Take 20 mg by mouth as needed. 3 pound wight gain    . guaifenesin (ROBITUSSIN) 100 MG/5ML syrup Take 200 mg by mouth every 6 (six) hours as needed for cough (Do not exceed 4 doses in 24 hours. If cough has not improved in 24 hours notify physician.).    [name] ipratropium (ATROVENT) 0.03 % nasal spray  Place 1 spray into both nostrils 2 (two) times daily.     [name] levETIRAcetam (KEPPRA) 500 MG tablet Take 1 tablet (500 mg total) by mouth 2 (two) times daily. 180 tablet 3  . lisinopril (ZESTRIL) 5 MG tablet Take 1 tablet by mouth daily.    [name] loratadine (CLARITIN) 10 MG tablet Take 10 mg by mouth daily.    . mesalamine (LIALDA) 1.2 g EC tablet Take 2 tablets (2.4 g total) by mouth daily with breakfast. 60 tablet 5  . Multiple Vitamins-Iron (MULTIVITAMINS WITH IRON) TABS tablet Take 1 tablet by mouth daily.    . ondansetron (ZOFRAN) 4 MG tablet Take 4 mg by mouth every 6 (six) hours as needed for nausea or vomiting.    [name] PAZEO 0.7 % SOLN Place 1 drop into both eyes daily as needed (irritation).     . primidone (MYSOLINE) 250 MG tablet Take 1 tab in AM, 2 tabs in PM 270 tablet 3  . sodium chloride (OCEAN) 0.65 % SOLN nasal spray Place 1 spray into both nostrils as needed for congestion.    . tamsulosin (FLOMAX) 0.4 MG CAPS capsule Take 1 capsule (0.4 mg total) by mouth daily. 30 capsule 30  . triamcinolone cream (KENALOG) 0.1 % Apply 1 application topically as directed.    . Vitamin D, Ergocalciferol, (DRISDOL) 1.25 MG (50000 UT) CAPS capsule Take 50,000 Units by mouth every 30 (thirty) days.      No current facility-administered medications on  file prior to visit.    Cardiovascular studies:  EKG 08/27/2019: Sinus rhythm 72 bpm. Normal EKG.  Echocardiogram 07/11/2019 (Outside study): Mild left ventricular dilatation.  EF 55-60%.  Normal diastolic function. Aortic valve sclerosis with mild stenosis by gradients, but accurate AVA not calculated.  Mild aortic valve regurgitation.  Echocardiogram 2017: - Left ventricle: The cavity size was mildly dilated. Systolic   function was normal. The estimated ejection fraction was in the   range of 50% to 55%. Features are consistent with a pseudonormal   left ventricular filling pattern, with concomitant abnormal   relaxation and increased filling  pressure (grade 2 diastolic   dysfunction). Doppler parameters are consistent with elevated   ventricular end-diastolic filling pressure. - Aortic valve: There was very mild stenosis. There was moderate   regurgitation. Peak velocity (S): 228 cm/s. Mean gradient (S): 11   mm Hg. Regurgitation pressure half-time: 393 ms. - Mitral valve: There was no evidence for stenosis. There was no   regurgitation. - Right ventricle: The cavity size was normal. Wall thickness was   normal. Systolic function was normal. - Pericardium, extracardiac: A trivial pericardial effusion was   identified. There was no evidence of hemodynamic compromise.  Vascular US 07/2016: - Findings consistent with acute occlusive deep vein thrombosis   involving the popliteal, proximal profunda, femoral, and common   femoral veins of the left lower extremity. Posterior tibial and   peroneal veins appear patent. - Findings also consistent with superficial thrombosis of the   proximal greater saphenous vein into the saphenofemoral junction   of the left lower extremity. - No evidence of deep vein thrombosis involving the right lower   extremity. - Unable to visualize the right peroneal vein well enough to fully   evaluate. - No evidence of superficial thrombosis involving the right lower   extremity. - No evidence of Baker&'s cyst on the right or left.  Recent labs: 07/28/2019: Glucose 125.  BUN/creatinine 24.9/1.25.  EGFR 59.  Sodium 140, potassium 5.0. H/H 13.4/40.6.  MCV 94.  Platelets 271. NT proBNP 238, mildly elevated. Iron 78 normal.  TIBC 177-.  Transferrin saturation 14% normal.   Review of Systems  Cardiovascular: Positive for leg swelling. Negative for chest pain, dyspnea on exertion, palpitations and syncope.         Vitals:   01/26/20 1412 01/26/20 1413  BP: (!) 124/51 (!) 116/54  Pulse: 75 66  Resp: 17   Temp: 98 F (36.7 C)   SpO2: 97%      Body mass index is 33.09 kg/m. Filed Weights    01/26/20 1412  Weight: 244 lb (110.7 kg)     Objective:    Physical Exam  Constitutional: No distress.  Neck: No JVD present.  Cardiovascular: Normal rate, regular rhythm and intact distal pulses.  Murmur heard.  Systolic murmur is present with a grade of 1/6 at the upper right sternal border.  Diastolic murmur is present with a grade of 1/6 at the upper right sternal border. Pulmonary/Chest: Effort normal and breath sounds normal. He has no wheezes. He has no rales.  Abdominal: There is no rebound.  Musculoskeletal:        General: Edema (1-2+ b/l) present.  Neurological: No cranial nerve deficit.  Psychiatric: He has a normal mood and affect.  Nursing note and vitals reviewed.         Assessment & Recommendations:   64 y/o Caucasian male with mild aortic stenosis and regurgitation, multiple medical comorbidities.  Mild AS, mild to mod AR: Repeat echocardiogram in 06/2020  Leg edema: He has prior presumed diagnosis of May Thurner syndrome. I do not see any laterality to his leg swelling today.  Will increase lasix to 20 mg bid, with additional dose of 20 mg as needed. Recommend daily weight checks.  F/u on 4/16.   Nigel Mormon, MD [facility] Pager: [phone] Office: [phone] If no answer Cell [phone]

## 2020-02-06 ENCOUNTER — Encounter: Payer: Self-pay | Admitting: Cardiology

## 2020-02-06 ENCOUNTER — Ambulatory Visit: Payer: Medicare Other | Admitting: Cardiology

## 2020-02-06 ENCOUNTER — Other Ambulatory Visit: Payer: Self-pay

## 2020-02-06 VITALS — BP 113/50 | HR 74 | Temp 97.5°F | Resp 16 | Ht 70.0 in | Wt 244.0 lb

## 2020-02-06 DIAGNOSIS — I350 Nonrheumatic aortic (valve) stenosis: Secondary | ICD-10-CM

## 2020-02-06 DIAGNOSIS — I351 Nonrheumatic aortic (valve) insufficiency: Secondary | ICD-10-CM

## 2020-02-06 DIAGNOSIS — R600 Localized edema: Secondary | ICD-10-CM

## 2020-02-06 NOTE — Progress Notes (Signed)
Subjective:   [name], male    DOB: [birth year], 64 y.o.   MRN: [id number]   Chief complaint:  Leg swelling  HPI  64 y/o Caucasian male with mild aortic stenosis and regurgitation, multiple medical comorbidities.  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  Patient is here today with his cousin Vaughan Basta. He is now more compliant with wearing compression stockings regularly. Leg swelling has improved on increased dose of lasix 20 mg bid. He has lost 4 lbs, according to his scale.   Current Outpatient Medications on File Prior to Visit  Medication Sig Dispense Refill  . acetaminophen (TYLENOL) 325 MG tablet Take 325 mg by mouth every 6 (six) hours as needed.    [name] acetaminophen (TYLENOL) 500 MG tablet Take 500 mg by mouth at bedtime.    . Certolizumab Pegol (CIMZIA PREFILLED) 2 X 200 MG/ML KIT Inject 400 mg into the skin every 30 (thirty) days.     . Cyanocobalamin (VITAMIN B-12 IJ) Inject 1 mL as directed every 30 (thirty) days.    . famotidine (PEPCID) 20 MG tablet Take 20 mg by mouth 2 (two) times daily.    . ferrous sulfate 325 (65 FE) MG tablet Take 325 mg by mouth daily with breakfast.    . furosemide (LASIX) 20 MG tablet Take 1 tablet (20 mg total) by mouth 2 (two) times daily. 60 tablet 2  . furosemide (LASIX) 20 MG tablet Take 1 tablet (20 mg total) by mouth as needed (In addition to 20 mg bid). 3 pound wight gain 30 tablet 2  . guaifenesin (ROBITUSSIN) 100 MG/5ML syrup Take 200 mg by mouth every 6 (six) hours as needed for cough (Do not exceed 4 doses in 24 hours. If cough has not improved in 24 hours notify  physician.).    [name] ipratropium (ATROVENT) 0.03 % nasal spray Place 1 spray into both nostrils 2 (two) times daily.     [name] levETIRAcetam (KEPPRA) 500 MG tablet Take 1 tablet (500 mg total) by mouth 2 (two) times daily. 180 tablet 3  . lisinopril (ZESTRIL) 5 MG tablet Take 1 tablet by mouth daily.    [name] loratadine (CLARITIN) 10 MG tablet Take 10 mg by mouth daily.    . mesalamine (LIALDA) 1.2 g EC tablet Take 2 tablets (2.4 g total) by mouth daily with breakfast. 60 tablet 5  . Multiple Vitamins-Iron (MULTIVITAMINS WITH IRON) TABS tablet Take 1 tablet by mouth daily.    . ondansetron (ZOFRAN) 4 MG tablet Take 4 mg by mouth every 6 (six) hours as needed for nausea or vomiting.    [name] PAZEO 0.7 % SOLN Place 1 drop into both eyes daily as needed (irritation).     . primidone (MYSOLINE) 250 MG tablet Take 1 tab in AM, 2 tabs in PM 270 tablet 3  . sodium chloride (OCEAN) 0.65 % SOLN nasal spray Place 1 spray into both nostrils as needed for congestion.    . tamsulosin (FLOMAX) 0.4 MG CAPS capsule Take 1 capsule (0.4 mg total) by mouth daily. 30 capsule 30  . triamcinolone cream (KENALOG) 0.1 % Apply 1 application topically as directed.    . Vitamin D, Ergocalciferol, (DRISDOL) 1.25 MG (50000 UT) CAPS capsule Take 50,000  Units by mouth every 30 (thirty) days.     [name] zinc oxide 20 % ointment Apply 1 application topically as needed for irritation.     No current facility-administered medications on file prior to visit.    Cardiovascular studies:  EKG 02/06/2020: Sinus rhythm 73 bpm. Normal EKG.  Echocardiogram 07/11/2019 (Outside study): Mild left ventricular dilatation.  EF 55-60%.  Normal diastolic function. Aortic valve sclerosis with mild stenosis by gradients, but accurate AVA not calculated.  Mild aortic valve regurgitation.  Echocardiogram 2017: - Left ventricle: The cavity size was mildly dilated. Systolic   function was normal. The estimated ejection fraction was in the   range of 50% to 55%.  Features are consistent with a pseudonormal   left ventricular filling pattern, with concomitant abnormal   relaxation and increased filling pressure (grade 2 diastolic   dysfunction). Doppler parameters are consistent with elevated   ventricular end-diastolic filling pressure. - Aortic valve: There was very mild stenosis. There was moderate   regurgitation. Peak velocity (S): 228 cm/s. Mean gradient (S): 11   mm Hg. Regurgitation pressure half-time: 393 ms. - Mitral valve: There was no evidence for stenosis. There was no   regurgitation. - Right ventricle: The cavity size was normal. Wall thickness was   normal. Systolic function was normal. - Pericardium, extracardiac: A trivial pericardial effusion was   identified. There was no evidence of hemodynamic compromise.  Vascular US 07/2016: - Findings consistent with acute occlusive deep vein thrombosis   involving the popliteal, proximal profunda, femoral, and common   femoral veins of the left lower extremity. Posterior tibial and   peroneal veins appear patent. - Findings also consistent with superficial thrombosis of the   proximal greater saphenous vein into the saphenofemoral junction   of the left lower extremity. - No evidence of deep vein thrombosis involving the right lower   extremity. - Unable to visualize the right peroneal vein well enough to fully   evaluate. - No evidence of superficial thrombosis involving the right lower   extremity. - No evidence of Baker&'s cyst on the right or left.  Recent labs: 07/28/2019: Glucose 125.  BUN/creatinine 24.9/1.25.  EGFR 59.  Sodium 140, potassium 5.0. H/H 13.4/40.6.  MCV 94.  Platelets 271. NT proBNP 238, mildly elevated. Iron 78 normal.  TIBC 177-.  Transferrin saturation 14% normal.   Review of Systems  Cardiovascular: Positive for leg swelling. Negative for chest pain, dyspnea on exertion, palpitations and syncope.         Vitals:   02/06/20 1346  BP: (!) 113/50    Pulse: 74  Resp: 16  Temp: (!) 97.5 F (36.4 C)  SpO2: 96%     Body mass index is 35.01 kg/m. Filed Weights   02/06/20 1346  Weight: 244 lb (110.7 kg)     Objective:    Physical Exam  Constitutional: No distress.  Neck: No JVD present.  Cardiovascular: Normal rate, regular rhythm and intact distal pulses.  Murmur heard.  Systolic murmur is present with a grade of 1/6 at the upper right sternal border.  Diastolic murmur is present with a grade of 1/6 at the upper right sternal border. Pulmonary/Chest: Effort normal and breath sounds normal. He has no wheezes. He has no rales.  Abdominal: There is no rebound.  Musculoskeletal:        General: Edema (1+  b/l) present.  Neurological: No cranial nerve deficit.  Psychiatric: He has a normal mood and affect.  Nursing note and  vitals reviewed.         Assessment & Recommendations:   64 y/o Caucasian male with mild aortic stenosis and regurgitation, multiple medical comorbidities.  Mild AS, mild to mod AR: Repeat echocardiogram in 06/2020  Leg edema: He has prior presumed diagnosis of May Thurner syndrome. I do not see any laterality to his leg swelling today.  Continue lasix to 20 mg bid, with additional dose of 20 mg as needed. Recommend daily weight checks.  F/u in 07/2020  Nigel Mormon, MD [facility] Pager: [phone] Office: [phone] If no answer Cell [phone]

## 2020-04-19 ENCOUNTER — Telehealth: Payer: Self-pay | Admitting: Internal Medicine

## 2020-04-19 NOTE — Telephone Encounter (Signed)
Orders faxed to [facility] attn [name], also requested they call the office to schedule appt if pt able to leave facility.

## 2020-04-19 NOTE — Telephone Encounter (Signed)
[name] from [facility] called and states that last month she noticed that a few days before his cimzia was due pt started having some diarrhea stools. This month she gave him the cimzia injection 2 days early but pt reported to her that he had 2 diarrhea stools over the weekend where he could not make it to the restroom. Pt did not mention any blood in the stool. He is currently taking Lialda 2.4gm daily along with the Cimzia. Please advise.

## 2020-04-19 NOTE — Telephone Encounter (Signed)
I would recommend GI profile stool, fecal calprotectin to assess for active inflammation If profile not available then stool culture, O&P and C diff testing + Fecal calprotectin Continue cimzia and lialda He will need OV soon as well if able to leave [facility]

## 2020-04-19 NOTE — Telephone Encounter (Signed)
[name] from [facility] is requesting a call back from a nurse. Assisted Living Nurse wants to inform that pt is experiencing increased diarrhea due to his Crohns Disease

## 2020-04-20 NOTE — Telephone Encounter (Signed)
Spoke with [name] and let her know the orders have been refaxed to [phone].

## 2020-04-20 NOTE — Telephone Encounter (Signed)
[name] called pt is having more Crohns flare up with diarrhea  fax [phone]

## 2020-04-22 ENCOUNTER — Other Ambulatory Visit: Payer: Self-pay

## 2020-04-22 MED ORDER — LEVOFLOXACIN 500 MG PO TABS: 500.0000 mg | ORAL_TABLET | Freq: Every day | ORAL | 0 refills | Status: DC

## 2020-04-22 NOTE — Telephone Encounter (Signed)
Per Dr. Norman Herrlich pt needs to be treated with Levofloxocin once daily for 7 days.

## 2020-04-22 NOTE — Telephone Encounter (Signed)
Pt tested negative for cdiff but positive for salmonella. [name] with [facility] calling to see what Dr. Norman Herrlich would like to do. Please advise.

## 2020-04-22 NOTE — Telephone Encounter (Signed)
To clarify pt needs Levaquin 574m daily for 7 days. [facility] [phone]

## 2020-04-22 NOTE — Telephone Encounter (Signed)
[name] called to go over recent labs with you stated she faxed them this morning

## 2020-05-04 NOTE — Telephone Encounter (Signed)
I received the patient's fecal calprotectin results which are dated 04/21/2020 Fecal calprotectin was notably elevated at 933  He was also found to have Salmonella and was treated with levofloxacin Fecal calprotectin can be elevated in an active IBD but also active infections and so my opinion is this elevation was likely due to active Salmonella enteritis/colitis  Please see how the patient is doing after being treated with Levaquin for Salmonella Is he still having diarrhea? He needs an office visit, next available if not already scheduled

## 2020-05-05 NOTE — Telephone Encounter (Signed)
Spoke with [name] at [facility] and she states he is better, explosive diarrhea is gone and he is back to his baseline. Pt scheduled to see [name] 06/23/20@2 :30pm. Caryl Asp will let pts family know about the appt.

## 2020-06-08 ENCOUNTER — Telehealth: Payer: Self-pay | Admitting: Internal Medicine

## 2020-06-08 NOTE — Telephone Encounter (Signed)
Given his history I would feel more

## 2020-06-08 NOTE — Telephone Encounter (Signed)
Attempted to call [name] back but she was not in the building. Will try again.

## 2020-06-08 NOTE — Telephone Encounter (Signed)
Given his history I would feel more comfortable with rechecking stool panel and inflammatory marker GI pathogen panel Fecal calprotectin Increase lialda to 4.8 g daily in the meantime to try and control any potential inflammation Can he be seen now in the office?  Previously his living facility had restrictions due to [place] Thanks

## 2020-06-08 NOTE — Telephone Encounter (Signed)
Pt got last dose of Cimzia on 06/01/20. Pt states that he has been having daily diarrhea that he cannot control at least once in the evening. He usually wears boxers but lately they have been putting him in depends. They have not seen any blood in the stool and report it is not explosive like it was last time with the infection. State he is having trouble "holding it in." Please advise.

## 2020-06-09 NOTE — Telephone Encounter (Signed)
Spoke with [name] and she is aware. Orders for labs and medication increase faxed to [facility] at [phone]. Requested results be faxed back to the office. Pt is already scheduled for an OV 07/27/20@1 :30pm.

## 2020-06-15 ENCOUNTER — Telehealth: Payer: Self-pay | Admitting: Internal Medicine

## 2020-06-15 MED ORDER — CIMZIA PREFILLED 2 X 200 MG/ML ~~LOC~~ KIT: 400.0000 mg | PACK | SUBCUTANEOUS | 1 refills | Status: DC

## 2020-06-15 NOTE — Telephone Encounter (Signed)
Refill sent to pharmacy.   

## 2020-06-21 ENCOUNTER — Telehealth: Payer: Self-pay | Admitting: Internal Medicine

## 2020-06-21 NOTE — Telephone Encounter (Signed)
Can change to Pentasa 1 g TID

## 2020-06-21 NOTE — Telephone Encounter (Signed)
[name] is having trouble swallowing the mesalamine. The pharmacy that [facility] uses recommends Pentasa and then they can open up the capsules and get the medicine to him that way. Please advise.

## 2020-06-22 NOTE — Telephone Encounter (Signed)
Order faxed back to [facility].

## 2020-06-22 NOTE — Telephone Encounter (Signed)
Order refaxed

## 2020-06-23 ENCOUNTER — Ambulatory Visit: Payer: Medicare Other | Admitting: Internal Medicine

## 2020-07-02 ENCOUNTER — Telehealth: Payer: Self-pay | Admitting: Internal Medicine

## 2020-07-02 NOTE — Telephone Encounter (Signed)
Spoke with [name] at [facility] and she took a telephone order to increase the pentasa to 1gm QID as below.

## 2020-07-02 NOTE — Telephone Encounter (Signed)
Pt has OV scheduled to see Dr. Norman Herrlich 07/27/20. [name] wanted to let [name] know that since they have changed his meds to the capsules he is not back to his baseline. States he is having 1-2 "explosions" in the evenings and they are having to put him in depends. Pt was switched from mesalamine due to difficulty swallowing pills to Pentasa 1gm tid. Please advise.

## 2020-07-02 NOTE — Telephone Encounter (Signed)
Ok, before we go back to [place], try Pentasa 1g QID (currently on TID)

## 2020-07-05 ENCOUNTER — Other Ambulatory Visit: Payer: Medicare Other

## 2020-07-05 ENCOUNTER — Other Ambulatory Visit: Payer: Self-pay

## 2020-07-05 ENCOUNTER — Ambulatory Visit: Payer: Medicare Other

## 2020-07-05 DIAGNOSIS — I351 Nonrheumatic aortic (valve) insufficiency: Secondary | ICD-10-CM

## 2020-07-26 ENCOUNTER — Encounter: Payer: Self-pay | Admitting: Cardiology

## 2020-07-26 ENCOUNTER — Ambulatory Visit: Payer: Medicare Other | Admitting: Cardiology

## 2020-07-26 ENCOUNTER — Other Ambulatory Visit: Payer: Self-pay

## 2020-07-26 VITALS — BP 124/46 | HR 67 | Resp 16 | Ht 70.0 in | Wt 236.0 lb

## 2020-07-26 DIAGNOSIS — I351 Nonrheumatic aortic (valve) insufficiency: Secondary | ICD-10-CM

## 2020-07-26 DIAGNOSIS — I350 Nonrheumatic aortic (valve) stenosis: Secondary | ICD-10-CM

## 2020-07-26 NOTE — Progress Notes (Signed)
Subjective:   [name], male    DOB: Nov 15, 1955, 64 y.o.   MRN: [id number]   Chief complaint:  Leg swelling  HPI  64 y/o Caucasian male with mild aortic stenosis and regurgitation, multiple medical comorbidities.  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  Patient is here today with his cousin Vaughan Basta. He is compliant with wearing compression stockings regularly. Leg swelling has improved.  Today, they brought his DNR form with him.  This is his baseline wishes.  Current Outpatient Medications on File Prior to Visit  Medication Sig Dispense Refill  . acetaminophen (TYLENOL) 325 MG tablet Take 325 mg by mouth every 6 (six) hours as needed.    [name] acetaminophen (TYLENOL) 500 MG tablet Take 500 mg by mouth at bedtime.    . Certolizumab Pegol (CIMZIA PREFILLED) 2 X 200 MG/ML KIT Inject 400 mg into the skin every 30 (thirty) days. 1 kit 1  . Cyanocobalamin (VITAMIN B-12 IJ) Inject 1 mL as directed every 30 (thirty) days.    . famotidine (PEPCID) 20 MG tablet Take 20 mg by mouth 2 (two) times daily.    . ferrous sulfate 325 (65 FE) MG tablet Take 325 mg by mouth daily with breakfast.    . furosemide (LASIX) 20 MG tablet Take 1 tablet (20 mg total) by mouth 2 (two) times daily. 60 tablet 2  . guaifenesin (ROBITUSSIN) 100 MG/5ML syrup Take 200 mg by mouth every 6 (six) hours as needed for cough (Do not exceed 4 doses in 24 hours. If cough has not improved in 24 hours notify physician.).    [name] ipratropium (ATROVENT) 0.03 % nasal spray Place 1 spray into both nostrils 2 (two) times daily.     [name] levETIRAcetam (KEPPRA) 500 MG tablet  Take 1 tablet (500 mg total) by mouth 2 (two) times daily. 180 tablet 3  . levofloxacin (LEVAQUIN) 500 MG tablet Take 1 tablet (500 mg total) by mouth daily. 7 tablet 0  . lisinopril (ZESTRIL) 5 MG tablet Take 1 tablet by mouth daily.    [name] loratadine (CLARITIN) 10 MG tablet Take 10 mg by mouth daily.    . mesalamine (LIALDA) 1.2 g EC tablet Take 2 tablets (2.4 g total) by mouth daily with breakfast. 60 tablet 5  . Multiple Vitamins-Iron (MULTIVITAMINS WITH IRON) TABS tablet Take 1 tablet by mouth daily.    . ondansetron (ZOFRAN) 4 MG tablet Take 4 mg by mouth every 6 (six) hours as needed for nausea or vomiting.    . primidone (MYSOLINE) 250 MG tablet Take 1 tab in AM, 2 tabs in PM 270 tablet 3  . sodium chloride (OCEAN) 0.65 % SOLN nasal spray Place 1 spray into both nostrils as needed for congestion.    . tamsulosin (FLOMAX) 0.4 MG CAPS capsule Take 1 capsule (0.4 mg total) by mouth daily. 30 capsule 30  . triamcinolone cream (KENALOG) 0.1 % Apply 1 application topically as directed.    . Vitamin D, Ergocalciferol, (DRISDOL) 1.25 MG (50000 UT) CAPS capsule Take 50,000 Units by mouth every 30 (thirty) days.     [name] zinc oxide 20 % ointment Apply 1 application topically as needed for irritation.     No current facility-administered medications on file  prior to visit.    Cardiovascular studies:  Echocardiogram 07/05/2020:  Left ventricle cavity is normal in size and wall thickness. Normal global  wall motion. Normal LV systolic function with EF 65%. Normal diastolic  filling pattern.  Structurally normal trileaflet aortic valve. Mild aortic stenosis.  Moderate (Grade II) aortic regurgitation. Vmax 2.1 m/sec, mean PG 12 mmHg,  AVA 1.4 cm2.  Mild (Grade I) mitral regurgitation.  Mild tricuspid regurgitation.  No evidence of pulmonary hypertension.  EKG 02/06/2020: Sinus rhythm 73 bpm. Normal EKG.  Vascular US 07/2016: - Findings consistent with acute occlusive deep vein thrombosis    involving the popliteal, proximal profunda, femoral, and common   femoral veins of the left lower extremity. Posterior tibial and   peroneal veins appear patent. - Findings also consistent with superficial thrombosis of the   proximal greater saphenous vein into the saphenofemoral junction   of the left lower extremity. - No evidence of deep vein thrombosis involving the right lower   extremity. - Unable to visualize the right peroneal vein well enough to fully   evaluate. - No evidence of superficial thrombosis involving the right lower   extremity. - No evidence of Baker&'s cyst on the right or left.  Recent labs: 11/03/2019: Glucose 103, BUN/Cr 25.8/1.29. EGFR 57. Na/K 141/5.0. Albumin 3.0. Rest of the CMP normal  07/28/2019: Glucose 125.  BUN/creatinine 24.9/1.25.  EGFR 59.  Sodium 140, potassium 5.0. H/H 13.4/40.6.  MCV 94.  Platelets 271. NT proBNP 238, mildly elevated. Iron 78 normal.  TIBC 177-.  Transferrin saturation 14% normal.   Review of Systems  Cardiovascular: Negative for chest pain, dyspnea on exertion, leg swelling, palpitations and syncope.         Vitals:   07/26/20 1408  BP: (!) 124/46  Pulse: 67  Resp: 16  SpO2: 96%     Body mass index is 33.86 kg/m. Filed Weights   07/26/20 1408  Weight: 236 lb (107 kg)     Objective:    Physical Exam Vitals and nursing note reviewed.  Constitutional:      General: He is not in acute distress. Neck:     Vascular: No JVD.  Cardiovascular:     Rate and Rhythm: Normal rate and regular rhythm.     Pulses: Intact distal pulses.     Heart sounds: Murmur heard.  Systolic murmur is present with a grade of 1/6 at the upper right sternal border.  Diastolic murmur is present with a grade of 1/4 at the upper right sternal border.   Pulmonary:     Effort: Pulmonary effort is normal.     Breath sounds: Normal breath sounds. No wheezing or rales.  Abdominal:     Tenderness: There is no rebound.    Neurological:     Cranial Nerves: No cranial nerve deficit.           Assessment & Recommendations:   64 y/o Caucasian male with mild aortic stenosis and regurgitation, multiple medical comorbidities.  Mild AS, mild to mod AR: Stable. Repeat echocardiogram in summer 2023.  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 I do not see any laterality to his leg swelling today. Continue lasix to 20 mg bid, with additional dose of 20 mg as needed. Recommend daily weight checks.  F/u in summer 2023  [place], MD [facility] Pager: [phone] Office: [phone] If no answer Cell [phone]

## 2020-07-27 ENCOUNTER — Encounter: Payer: Self-pay | Admitting: Internal Medicine

## 2020-07-27 ENCOUNTER — Ambulatory Visit (INDEPENDENT_AMBULATORY_CARE_PROVIDER_SITE_OTHER): Payer: Medicare Other | Admitting: Internal Medicine

## 2020-07-27 ENCOUNTER — Other Ambulatory Visit (INDEPENDENT_AMBULATORY_CARE_PROVIDER_SITE_OTHER): Payer: Medicare Other

## 2020-07-27 ENCOUNTER — Telehealth: Payer: Self-pay | Admitting: *Deleted

## 2020-07-27 VITALS — BP 88/48 | HR 72 | Ht 69.0 in | Wt 269.0 lb

## 2020-07-27 DIAGNOSIS — K50818 Crohn's disease of both small and large intestine with other complication: Secondary | ICD-10-CM

## 2020-07-27 DIAGNOSIS — R197 Diarrhea, unspecified: Secondary | ICD-10-CM | POA: Diagnosis not present

## 2020-07-27 DIAGNOSIS — R1319 Other dysphagia: Secondary | ICD-10-CM

## 2020-07-27 DIAGNOSIS — D509 Iron deficiency anemia, unspecified: Secondary | ICD-10-CM | POA: Diagnosis not present

## 2020-07-27 DIAGNOSIS — Z8639 Personal history of other endocrine, nutritional and metabolic disease: Secondary | ICD-10-CM

## 2020-07-27 DIAGNOSIS — E538 Deficiency of other specified B group vitamins: Secondary | ICD-10-CM

## 2020-07-27 LAB — COMPREHENSIVE METABOLIC PANEL
ALT: 12 U/L (ref 0–53)
AST: 12 U/L (ref 0–37)
Albumin: 3.5 g/dL (ref 3.5–5.2)
Alkaline Phosphatase: 119 U/L — ABNORMAL HIGH (ref 39–117)
BUN: 23 mg/dL (ref 6–23)
CO2: 25 mEq/L (ref 19–32)
Calcium: 8.3 mg/dL — ABNORMAL LOW (ref 8.4–10.5)
Chloride: 105 mEq/L (ref 96–112)
Creatinine, Ser: 1.29 mg/dL (ref 0.40–1.50)
GFR: 57.32 mL/min — ABNORMAL LOW (ref >60.00)
Glucose, Bld: 96 mg/dL (ref 70–99)
Potassium: 4.8 mEq/L (ref 3.5–5.1)
Sodium: 135 mEq/L (ref 135–145)
Total Bilirubin: 0.3 mg/dL (ref 0.2–1.2)
Total Protein: 7.1 g/dL (ref 6.0–8.3)

## 2020-07-27 LAB — CBC WITH DIFFERENTIAL/PLATELET
Basophils Absolute: 0 10*3/uL (ref 0.0–0.1)
Basophils Relative: 0.7 % (ref 0.0–3.0)
Eosinophils Absolute: 0 10*3/uL (ref 0.0–0.7)
Eosinophils Relative: 0.1 % (ref 0.0–5.0)
HCT: 36.6 % — ABNORMAL LOW (ref 39.0–52.0)
Hemoglobin: 12.2 g/dL — ABNORMAL LOW (ref 13.0–17.0)
Lymphocytes Relative: 10.5 % — ABNORMAL LOW (ref 12.0–46.0)
Lymphs Abs: 0.7 10*3/uL (ref 0.7–4.0)
MCHC: 33.4 g/dL (ref 30.0–36.0)
MCV: 96.6 fl (ref 78.0–100.0)
Monocytes Absolute: 1 10*3/uL (ref 0.1–1.0)
Monocytes Relative: 15.1 % — ABNORMAL HIGH (ref 3.0–12.0)
Neutro Abs: 4.7 10*3/uL (ref 1.4–7.7)
Neutrophils Relative %: 73.6 % (ref 43.0–77.0)
Platelets: 267 10*3/uL (ref 150.0–400.0)
RBC: 3.79 Mil/uL — ABNORMAL LOW (ref 4.22–5.81)
RDW: 13.7 % (ref 11.5–15.5)
WBC: 6.4 10*3/uL (ref 4.0–10.5)

## 2020-07-27 LAB — FERRITIN: Ferritin: 45.9 ng/mL (ref 22.0–322.0)

## 2020-07-27 LAB — HIGH SENSITIVITY CRP: CRP, High Sensitivity: 53.57 mg/L — ABNORMAL HIGH (ref 0.000–5.000)

## 2020-07-27 MED ORDER — SUPREP BOWEL PREP KIT 17.5-3.13-1.6 GM/177ML PO SOLN: 1.0000 | ORAL | 0 refills | Status: DC

## 2020-07-27 NOTE — Patient Instructions (Addendum)
Your provider has requested that you go to the basement level for lab work before leaving today. Press "B" on the elevator. The lab is located at the first door on the left as you exit the elevator.  ____________________________________________________________  We have given you orders to have C Diff stool testing completed at [facility]. Once this testing has been completed, please have results faxed to [phone], attention: Dottie.  _____________________________________________________________  Continue Pentasa, 1 gram four times daily.  _____________________________________________________________  [name] have been scheduled for a colonoscopy. Please follow written instructions given to you at your visit today.  Please pick up your prep supplies at the pharmacy within the next 1-3 days. If you use inhalers (even only as needed), please bring them with you on the day of your procedure.  _____________________________________________________________  [name] must have written permission from your power of attorney to go forward with endoscopy/colonoscopy prior to having these tests completed.   _____________________________________________________________  If you are age 58 or older, your body mass index should be between 23-30. Your Body mass index is 39.72 kg/m. If this is out of the aforementioned range listed, please consider follow up with your Primary Care Provider.  ______________________________________________________________  Due to recent changes in healthcare laws, you may see the results of your imaging and laboratory studies on MyChart before your provider has had a chance to review them.  We understand that in some cases there may be results that are confusing or concerning to you. Not all laboratory results come back in the same time frame and the provider may be waiting for multiple results in order to interpret others.  Please give [place] 48 hours in order for your  provider to thoroughly review all the results before contacting the office for clarification of your results.

## 2020-07-27 NOTE — Progress Notes (Signed)
Subjective:    Patient ID: [name], male    DOB: 1956-08-04, 64 y.o.   MRN: [id number]  HPI [name] is 64 year old with history of ileocolonic fistulizing and stricturing Crohn's disease diagnosed in 1985, prior right hemicolectomy, history of lysis of adhesions of the left hemicolectomy, history of IDA, gallstones status post cholecystectomy, history of ankylosing spondylitis, prior DVT who is here today for follow-up.  He is here with his family member [name].  He was last seen by virtual visit on 05/06/2019.  He has been struggling with recurrent diarrhea and in June was diagnosed with Salmonella by GI pathogen panel.  He was treated with levofloxacin and symptoms improved.  Then several weeks later his diarrhea returned and has continued rather unabated since then.  He was taking Lialda 2.4 g and I increased this to 4.8 g trying to get diarrhea under control however these tablets became difficult for him to swallow.  We changed him to Pentasa and have increase the dose slowly currently now 1 g 4 times daily.  He continues to have diarrhea and loose urgent stools.  He can have accidents which are difficult to control.  He has 2-4 bowel movements per day but it is hard for him to quantify exactly how many he has.  He is not having abdominal pain but has noticed increased intestinal gas.  His appetite has remained very good.  He denies nausea and vomiting.  He did have issue recently swallowing roast beef and had this stick in his esophagus for a few seconds.  This also occurs with large pills.  He denies heartburn.  He has continued Cimzia which was started in April 2020 after he developed antibodies to Humira.  His last colonoscopy was in October 2018  He recently had a negative GI pathogen panel but C. difficile was not checked.  C. difficile was negative in June when he was diagnosed with Salmonella.  He very recently had a fecal calprotectin at 1700 this was on 06/10/2020.   Review  of Systems As per HPI, otherwise negative  Current Medications, Allergies, Past Medical History, Past Surgical History, Family History and Social History were reviewed in [employer] record.     Objective:   Physical Exam BP (!) 88/48   Pulse 72   Ht 5' 9"  (1.753 m)   Wt 269 lb (122 kg)   SpO2 99%   BMI 39.72 kg/m  Gen: awake, alert, NAD HEENT: anicteric CV: RRR, no mrg Pulm: CTA b/l Abd: soft, NT/ND, +BS throughout Ext: no c/c/e Neuro: nonfocal       Assessment & Plan:   64 year old with history of ileocolonic fistulizing and stricturing Crohn's disease diagnosed in 1985, prior right hemicolectomy, history of lysis of adhesions of the left hemicolectomy, history of IDA, gallstones status post cholecystectomy, history of ankylosing spondylitis, prior DVT who is here today for follow-up.   1.  Crohn's ileocolitis with history of fistula and stricture --his Crohn's disease had been under good control after switching Humira to Cimzia.  He did have Salmonella enteritis which was treated with levofloxacin and symptoms have improved.  Now he is having diarrhea and increased gas with a very elevated fecal calprotectin consistent with ongoing inflammation.  We discussed how this is most likely Crohn's disease causing current symptoms.  I recommend the following: --Check C. difficile PCR --Check Cimzia antibody, his last dose was this morning --CBC, CMP, CRP, QuantiFERON gold --Proceed with colonoscopy,  we discussed the risk, benefits alternatives reviewed  2. Pill and solid food dysphagia --upper endoscopy with possible dilation  3.  History of IDA --continue oral iron, recheck ferritin today  4.  B12 deficiency --continue IM B12  30 minutes total spent today including patient facing time, coordination of care, reviewing medical history/procedures/pertinent radiology studies, and documentation of the encounter.

## 2020-07-27 NOTE — Telephone Encounter (Signed)
[employer]   Calling you back.

## 2020-07-27 NOTE — Telephone Encounter (Signed)
I have left a voicemail for patient's POA (and sister), [name] to call our office back. Patient is scheduled for endoscopy/colonoscopy for evaluation of diarrhea, dysphagia, and f/u crohns. However, due to the fact that [name] lives in [place], it is not possible for her to be with him for his procedure. She can give written consent for the procedure as well as verbal consent for caregiver to bring [name] for procedure.  I will send written instructions to her and have her look over and sign. I will await her call back to discuss this information.

## 2020-07-28 NOTE — Telephone Encounter (Signed)
Left message for patient's POA, Cathy to call back.

## 2020-07-28 NOTE — Telephone Encounter (Signed)
I have spoken to patient's POA/sister, [name] to advise that we would like to perform endoscopy/colonoscopy on patient for further evaluation of continued diarrhea despite crohns medications and previous treatment for samonella. We would also like to evaluate him due to dysphagia to pills/solids and for IDA. I have advised that we are doing labwork and have explained the results of the labwork we have already gotten back. In addition, I have advised that we have ordered c diff testing to be completed for completeness. [name] verbalizes understanding and states she is in agreement with this plan. I have sent copies of all prep paperwork for endoscopy/colonoscopy, patient informed consent, and patient pre-procedure acknowledgement. She will sign appropriately and return to [place].

## 2020-07-29 LAB — QUANTIFERON-TB GOLD PLUS
Mitogen-NIL: 0.46 IU/mL
NIL: 0.03 IU/mL
QuantiFERON-TB Gold Plus: UNDETERMINED — AB
TB1-NIL: 0 IU/mL
TB2-NIL: 0.01 IU/mL

## 2020-08-06 LAB — SERIAL MONITORING

## 2020-08-09 LAB — CERTOLIZUMAB AND ANTI-CERTO AB
Anti-Certolizumab Ab Level: 11606 ng/mL
Certolizumab DRUG Level: <1 ug/mL

## 2020-08-10 ENCOUNTER — Other Ambulatory Visit: Payer: Self-pay

## 2020-08-10 DIAGNOSIS — K50919 Crohn's disease, unspecified, with unspecified complications: Secondary | ICD-10-CM

## 2020-08-10 MED ORDER — AZATHIOPRINE 50 MG PO TABS: 50.0000 mg | ORAL_TABLET | Freq: Every day | ORAL | 3 refills | Status: AC

## 2020-08-10 NOTE — Progress Notes (Signed)
aza

## 2020-08-16 ENCOUNTER — Telehealth: Payer: Self-pay | Admitting: Internal Medicine

## 2020-08-16 NOTE — Telephone Encounter (Signed)
Spoke with Caryl Asp and let her know we just got prior auth for Stelara today. Left message for [facility] to get Stelara infusion scheduled.

## 2020-08-16 NOTE — Telephone Encounter (Signed)
Joey from [facility] called inquiring about pt's stelara infusion. Pls call her.

## 2020-08-17 ENCOUNTER — Other Ambulatory Visit: Payer: Self-pay

## 2020-08-17 DIAGNOSIS — K50919 Crohn's disease, unspecified, with unspecified complications: Secondary | ICD-10-CM

## 2020-08-17 NOTE — Telephone Encounter (Signed)
Pt scheduled for stelara infusion at [facility] short stay 08/23/20 at 12noon. Orders in epic. [facility] to provide negative covid test result upon arrival. Spoke with [name] and she is aware.

## 2020-08-23 ENCOUNTER — Ambulatory Visit (HOSPITAL_COMMUNITY)
Admission: RE | Admit: 2020-08-23 | Discharge: 2020-08-23 | Disposition: A | Discharge status: Home / Self Care | Payer: Medicare Other | Source: Ambulatory Visit | Attending: Internal Medicine | Admitting: Internal Medicine

## 2020-08-23 ENCOUNTER — Other Ambulatory Visit: Payer: Self-pay

## 2020-08-23 DIAGNOSIS — K50919 Crohn's disease, unspecified, with unspecified complications: Secondary | ICD-10-CM | POA: Diagnosis present

## 2020-08-23 MED ORDER — USTEKINUMAB 130 MG/26ML IV SOLN
520.0000 mg | Freq: Once | INTRAVENOUS | Status: AC
  Administered 2020-08-23: 520 mg via INTRAVENOUS
  Filled 2020-08-23: qty 104

## 2020-09-23 ENCOUNTER — Telehealth: Payer: Self-pay | Admitting: Internal Medicine

## 2020-09-23 NOTE — Telephone Encounter (Signed)
Pt scheduled to see [name] Monday, 09-27-2020, for endo/colon. Pt's consent form was expired. Performed a phone consent with Emmit Alexanders, CMA to obtain consent again from Mr. Mees's power of attorney, [name]. POA gave consent for endo/colon to be performed.

## 2020-09-27 ENCOUNTER — Encounter: Payer: Self-pay | Admitting: Internal Medicine

## 2020-09-27 ENCOUNTER — Ambulatory Visit (AMBULATORY_SURGERY_CENTER): Payer: Medicare Other | Admitting: Internal Medicine

## 2020-09-27 ENCOUNTER — Other Ambulatory Visit: Payer: Self-pay

## 2020-09-27 VITALS — BP 105/64 | HR 64 | Temp 97.5°F | Resp 14 | Ht 69.0 in | Wt 269.0 lb

## 2020-09-27 DIAGNOSIS — K2950 Unspecified chronic gastritis without bleeding: Secondary | ICD-10-CM

## 2020-09-27 DIAGNOSIS — K50818 Crohn's disease of both small and large intestine with other complication: Secondary | ICD-10-CM

## 2020-09-27 DIAGNOSIS — R1319 Other dysphagia: Secondary | ICD-10-CM

## 2020-09-27 DIAGNOSIS — K9189 Other postprocedural complications and disorders of digestive system: Secondary | ICD-10-CM

## 2020-09-27 DIAGNOSIS — K319 Disease of stomach and duodenum, unspecified: Secondary | ICD-10-CM | POA: Diagnosis not present

## 2020-09-27 DIAGNOSIS — K5140 Inflammatory polyps of colon without complications: Secondary | ICD-10-CM | POA: Diagnosis not present

## 2020-09-27 DIAGNOSIS — D122 Benign neoplasm of ascending colon: Secondary | ICD-10-CM

## 2020-09-27 DIAGNOSIS — K50919 Crohn's disease, unspecified, with unspecified complications: Secondary | ICD-10-CM

## 2020-09-27 DIAGNOSIS — K2900 Acute gastritis without bleeding: Secondary | ICD-10-CM | POA: Diagnosis not present

## 2020-09-27 MED ORDER — SODIUM CHLORIDE 0.9 % IV SOLN: 500.0000 mL | Freq: Once | INTRAVENOUS | Status: DC

## 2020-09-27 MED ORDER — BUDESONIDE 3 MG PO CPEP: 9.0000 mg | ORAL_CAPSULE | Freq: Every day | ORAL | 0 refills | Status: AC

## 2020-09-27 MED ORDER — NYSTATIN 100000 UNIT/GM EX OINT: 1.0000 "application " | TOPICAL_OINTMENT | Freq: Three times a day (TID) | CUTANEOUS | 1 refills | Status: DC

## 2020-09-27 NOTE — Telephone Encounter (Signed)
New consent form signed by Emmit Alexanders, [place] and Alphonsa Gin, RN.

## 2020-09-27 NOTE — Patient Instructions (Signed)
HANDOUTS PROVIDED ON: POST DILATION DIET & GASTRITIS  The biopsies taken today have been sent for pathology.  The results can take 1-3 weeks to receive.    You may resume your previous medication schedule.  For today you should have only clear liquids from 12 pm - 1 pm then only soft foods for the rest of the day.  Thank you for allowing [place] to care for you today!!!   YOU HAD AN ENDOSCOPIC PROCEDURE TODAY AT [place] [facility]:   Refer to the procedure report that was given to you for any specific questions about what was found during the examination.  If the procedure report does not answer your questions, please call your gastroenterologist to clarify.  If you requested that your care partner not be given the details of your procedure findings, then the procedure report has been included in a sealed envelope for you to review at your convenience later.  YOU SHOULD EXPECT: Some feelings of bloating in the abdomen. Passage of more gas than usual.  Walking can help get rid of the air that was put into your GI tract during the procedure and reduce the bloating. If you had a lower endoscopy (such as a colonoscopy or flexible sigmoidoscopy) you may notice spotting of blood in your stool or on the toilet paper. If you underwent a bowel prep for your procedure, you may not have a normal bowel movement for a few days.  Please Note:  You might notice some irritation and congestion in your nose or some drainage.  This is from the oxygen used during your procedure.  There is no need for concern and it should clear up in a day or so.  SYMPTOMS TO REPORT IMMEDIATELY:   Following lower endoscopy (colonoscopy or flexible sigmoidoscopy):  Excessive amounts of blood in the stool  Significant tenderness or worsening of abdominal pains  Swelling of the abdomen that is new, acute  Fever of 100F or higher   Following upper endoscopy (EGD)  Vomiting of blood or coffee ground material  New chest  pain or pain under the shoulder blades  Painful or persistently difficult swallowing  New shortness of breath  Fever of 100F or higher  Black, tarry-looking stools  For urgent or emergent issues, a gastroenterologist can be reached at any hour by calling [phone]. Do not use MyChart messaging for urgent concerns.    DIET:  We do recommend clear liquids until 1 pm then soft foods for the rest of today.  Tomorrow you can start with a small meal at first, but then you may proceed to your regular diet.  Drink plenty of fluids but you should avoid alcoholic beverages for 24 hours.  ACTIVITY:  You should plan to take it easy for the rest of today and you should NOT DRIVE or use heavy machinery until tomorrow (because of the sedation medicines used during the test).    FOLLOW UP: Our staff will call the number listed on your records Wednesday morning between 7:15 am and 8:15 pm to check on you and address any questions or concerns that you may have regarding the information given to you following your procedure. If we do not reach you, we will leave a message.  We will attempt to reach you two times.  During this call, we will ask if you have developed any symptoms of COVID 19. If you develop any symptoms (ie: fever, flu-like symptoms, shortness of breath, cough etc.) before then, please call (  [phone].  If you test positive for Covid 19 in the 2 weeks post procedure, please call and report this information to [place].    If any biopsies were taken you will be contacted by phone or by letter within the next 1-3 weeks.  Please call us at [phone] if you have not heard about the biopsies in 3 weeks.    SIGNATURES/CONFIDENTIALITY: You and/or your care partner have signed paperwork which will be entered into your electronic medical record.  These signatures attest to the fact that that the information above on your After Visit Summary has been reviewed and is understood.  Full responsibility of  the confidentiality of this discharge information lies with you and/or your care-partner.

## 2020-09-27 NOTE — Progress Notes (Signed)
Called to room to assist during endoscopic procedure.  Patient ID and intended procedure confirmed with present staff. Received instructions for my participation in the procedure from the performing physician.  

## 2020-09-27 NOTE — Progress Notes (Signed)
To PACU, vss. Report to [email or web address] 

## 2020-09-27 NOTE — Op Note (Signed)
[facility] Patient Name: [name] Procedure Date: 09/27/2020 11:05 AM MRN: [id number] Endoscopist: Jerene Bears , MD Age: 64 Referring MD:  Date of Birth: July 10, 1956 Gender: Male Account #: [id number] Procedure:                Upper GI endoscopy Indications:              Dysphagia Medicines:                Monitored Anesthesia Care Procedure:                Pre-Anesthesia Assessment:                           - Prior to the procedure, a History and Physical                            was performed, and patient medications and                            allergies were reviewed. The patient's tolerance of                            previous anesthesia was also reviewed. The risks                            and benefits of the procedure and the sedation                            options and risks were discussed with the patient.                            All questions were answered, and informed consent                            was obtained. Prior Anticoagulants: The patient has                            taken no previous anticoagulant or antiplatelet                            agents. ASA Grade Assessment: III - A patient with                            severe systemic disease. After reviewing the risks                            and benefits, the patient was deemed in                            satisfactory condition to undergo the procedure.                           After obtaining informed consent, the endoscope was  passed under direct vision. Throughout the                            procedure, the patient's blood pressure, pulse, and                            oxygen saturations were monitored continuously. The                            Endoscope was introduced through the mouth, and                            advanced to the second part of duodenum. The upper                            GI endoscopy was accomplished without  difficulty.                            The patient tolerated the procedure well. Scope In: Scope Out: Findings:                 Normal esophageal mucosa. No endoscopic abnormality                            was evident in the esophagus to explain the                            patient's complaint of dysphagia. It was decided,                            however, to proceed with dilation of the entire                            esophagus. Attempt to initially pass [address]                            proved difficult to get dilator past the oropharynx                            and given this attempt was aborted. The scope was                            reinserted and a guidewire was placed and the scope                            was withdrawn. Dilation was performed with a Savary                            dilator with moderate resistance at 17 mm.                           The cardia and gastric fundus were normal on  retroflexion.                           Segmental moderate inflammation characterized by                            adherent blood and erythema was found on the                            greater curvature of the gastric body. Biopsies                            were taken with a cold forceps for histology and                            Helicobacter pylori testing.                           The examined duodenum was normal. Complications:            No immediate complications. Estimated Blood Loss:     Estimated blood loss was minimal. Impression:               - Normal esophageal mucosa. No endoscopic                            esophageal abnormality to explain patient's                            dysphagia. Esophagus dilated with Savary over a                            guidewire at 17 mm.                           - Localized gastritis in gastric body. Biopsied.                           - Normal examined duodenum. Recommendation:            - Patient has a contact number available for                            emergencies. The signs and symptoms of potential                            delayed complications were discussed with the                            patient. Return to normal activities tomorrow.                            Written discharge instructions were provided to the                            patient.                           -  Post-dilation diet and then resume previous diet.                           - Continue present medications.                           - Await pathology results.                           - See the other procedure note for documentation of                            additional recommendations. Jerene Bears, MD 09/27/2020 11:54:55 AM This report has been signed electronically.

## 2020-09-27 NOTE — Progress Notes (Signed)
VS taken by C.W. 

## 2020-09-27 NOTE — Progress Notes (Signed)
Medication reconciliation gone over with [name] at [facility] as pt. Unable to remember when he last took his medicines.

## 2020-09-27 NOTE — Op Note (Addendum)
[facility] Patient Name: [name] Procedure Date: 09/27/2020 11:04 AM MRN: [id number] Endoscopist: Jerene Bears , MD Age: 64 Referring MD:  Date of Birth: 1956/01/19 Gender: Male Account #: [id number] Procedure:                Colonoscopy Indications:              Crohn's disease of the small bowel and colon dated                            back to 1985, Disease activity assessment of                            Crohn's disease of the small bowel and colon,                            recent diarrhea, recent lab data revealed                            antibodies to Cimzia (after previous use of Humira                            with antibody formation); last colonoscopy Oct 2018 Medicines:                Monitored Anesthesia Care Procedure:                Pre-Anesthesia Assessment:                           - Prior to the procedure, a History and Physical                            was performed, and patient medications and                            allergies were reviewed. The patient's tolerance of                            previous anesthesia was also reviewed. The risks                            and benefits of the procedure and the sedation                            options and risks were discussed with the patient.                            All questions were answered, and informed consent                            was obtained. Prior Anticoagulants: The patient has                            taken no previous anticoagulant or antiplatelet  agents. ASA Grade Assessment: III - A patient with                            severe systemic disease. After reviewing the risks                            and benefits, the patient was deemed in                            satisfactory condition to undergo the procedure.                           After obtaining informed consent, the colonoscope                            was passed under direct  vision. Throughout the                            procedure, the patient's blood pressure, pulse, and                            oxygen saturations were monitored continuously. The                            Colonoscope was introduced through the anus and                            advanced to the ileocolonic anastomosis. The                            colonoscopy was performed without difficulty. The                            patient tolerated the procedure well. The quality                            of the bowel preparation was good. The ileocecal                            valve, appendiceal orifice, and rectum were                            photographed. Scope In: 11:30:58 AM Scope Out: 11:47:00 AM Scope Withdrawal Time: 0 hours 14 minutes 58 seconds  Total Procedure Duration: 0 hours 16 minutes 2 seconds  Findings:                 The perianal exam findings include a perianal                            fungal rash.                           The digital rectal exam was normal.  There was evidence of a prior end-to-end                            ileo-colonic anastomosis in the ascending colon.                            This was characterized by edema, erythema, mild                            stenosis and ulceration consistent with active                            Crohn's diease. The anastomosis could not be                            traversed and thus the TI was not seen adequately                            today. This was biopsied with a cold forceps for                            histology.                           A 4 mm polyp was found in the ascending colon. The                            polyp was sessile. The polyp was removed with a                            cold snare. Resection and retrieval were complete.                           The transverse colon and ascending colon                            immediately proximal to the anastomosis  appeared                            normal. Four biopsies were taken every 10 cm with a                            cold forceps from the ascending colon and                            transverse colon for Crohn's disease surveillance                            and dysplasia surveillance. These biopsy specimens                            were sent to Pathology.  There was evidence of a prior end-to-end                            colo-colonic anastomosis in the descending/sigmoid                            colon. This was patent and was characterized by                            congestion, erosion, erythema and inflammation.                            This was biopsied with a cold forceps for histology.                           The rectum, recto-sigmoid colon and distal/mid                            sigmoid colon appeared normal. Four biopsies were                            taken every 10 cm with a cold forceps from the                            sigmoid colon, rectum and rectosigmoid colon for                            Crohn's disease surveillance and dysplasia                            surveillance. These biopsy specimens were sent to                            Pathology.                           The retroflexed view of the distal rectum and anal                            verge was normal and showed no anal or rectal                            abnormalities. Complications:            No immediate complications. Estimated Blood Loss:     Estimated blood loss was minimal. Impression:               - Perianal fungal rash found on perianal exam.                           - End-to-end ileo-colonic anastomosis,                            characterized by edema, erythema, ulceration and  mild stenosis. Biopsied. Active Crohn's disease at                            the ileocolonic anastomosis.                           - One 4 mm polyp  in the ascending colon, removed                            with a cold snare. Resected and retrieved.                           - The transverse colon and ascending colon are                            normal. Biopsied. No evidence of active Crohn's                            colitis in the ascending and transverse colon.                           - Patent end-to-end colo-colonic anastomosis,                            characterized by congestion, erosion, erythema and                            inflammation. Biopsied. Active Crohn's disease at                            the colocolonic anastomosis in the left colon.                           - The rectum, recto-sigmoid colon and distal                            sigmoid colon are normal. Biopsied. No evidence of                            active Crohn's colitis in the left colon other than                            at the anastomosis.                           - The distal rectum and anal verge are normal on                            retroflexion view. Recommendation:           - Patient has a contact number available for                            emergencies. The signs and symptoms of potential  delayed complications were discussed with the                            patient. Return to normal activities tomorrow.                            Written discharge instructions were provided to the                            patient.                           - Resume previous diet.                           - Continue present medications.                           - Continue Stelara (1st dose given 08/23/2020, next                            doses will be subcutaneous), newly started                            azathioprine 50 mg daily, and Lialda. Would start                            Entocort 9 mg daily x 6 weeks. Would use Nystatin                            ointment TID-QID to perianal/perineal skin for                             fungal infection due to recent diarrhea.                           - Await pathology results.                           - Follow-up with me in office in Jan or Feb 2022.                           - Repeat colonoscopy is recommended for                            surveillance. The colonoscopy date will be                            determined after pathology results from today's                            exam become available for review. Jerene Bears, MD 09/27/2020 12:12:57 PM This report has been signed electronically.

## 2020-09-29 ENCOUNTER — Telehealth: Payer: Self-pay

## 2020-09-29 NOTE — Telephone Encounter (Signed)
  Follow up Call-  Call back number 09/27/2020  Post procedure Call Back phone  # [phone] [employer] will call him to the desk  Permission to leave phone message Yes  Some recent data might be hidden     Patient questions:  Do you have a fever, pain , or abdominal swelling? No. Pain Score  0 *  Have you tolerated food without any problems? Yes.    Have you been able to return to your normal activities? Yes.    Do you have any questions about your discharge instructions: Diet   No. Medications  No. Follow up visit  No.  Do you have questions or concerns about your Care? No.  Actions: * If pain score is 4 or above: 1. No action needed, pain <4.Have you developed a fever since your procedure? no  2.   Have you had an respiratory symptoms (SOB or cough) since your procedure? no  3.   Have you tested positive for COVID 19 since your procedure no  4.   Have you had any family members/close contacts diagnosed with the COVID 19 since your procedure?  no   If yes to any of these questions please route to Joylene John, RN and Joella Prince, RN

## 2020-10-06 NOTE — Progress Notes (Signed)
Spoke with [name] at [facility] and she is aware and will let Irma know. Also discussed with Cathy. Pt scheduled for OV with [name] 11/30/20 at 3pm.

## 2020-11-24 ENCOUNTER — Ambulatory Visit: Payer: Medicare Other | Admitting: Neurology

## 2020-11-30 ENCOUNTER — Ambulatory Visit (INDEPENDENT_AMBULATORY_CARE_PROVIDER_SITE_OTHER): Payer: Medicare Other | Admitting: Internal Medicine

## 2020-11-30 ENCOUNTER — Encounter: Payer: Self-pay | Admitting: Internal Medicine

## 2020-11-30 VITALS — BP 106/52 | HR 59 | Ht 69.0 in | Wt 228.0 lb

## 2020-11-30 DIAGNOSIS — E538 Deficiency of other specified B group vitamins: Secondary | ICD-10-CM | POA: Diagnosis not present

## 2020-11-30 DIAGNOSIS — D509 Iron deficiency anemia, unspecified: Secondary | ICD-10-CM

## 2020-11-30 DIAGNOSIS — K50819 Crohn's disease of both small and large intestine with unspecified complications: Secondary | ICD-10-CM

## 2020-11-30 NOTE — Progress Notes (Signed)
Subjective:    Patient ID: [name], male    DOB: 07-26-1956, 65 y.o.   MRN: [id number]  HPI [name] is a 65 year old male with a history of ileocolonic visualizing and stricturing Crohn's disease dating back to 1985, prior right hemicolectomy, history of LOA and left hemicolectomy, history of IDA, gallstones with prior cholecystectomy, history of ankylosing spondylitis and prior DVT who is here for follow-up.  He is here today with his family member [name].  He was last seen in the office in October 2021 and for upper endoscopy and colonoscopy on 09/27/2020.  Upper endoscopy revealed normal esophagus empirically dilated with 17 mm Savary dilator.  There was moderate inflammation in the greater curve of the gastric body which was biopsied.  The examined duodenum was normal.  Gastric biopsies showed mild reactive gastropathy and mild chronic gastritis without H. Pylori. Colonoscopy revealed a perianal fungal rash.  Prior end-to-end ileocolonic anastomosis in the ascending colon with active inflammation and stenosis.  This was biopsied.  4 mm polyp in the ascending colon was removed.  Prior to and in colocolonic anastomosis in the descending colon with congestion and erosion and inflammation.  Surveillance biopsies were performed.  Pathology showed severely active chronic ileocolitis in the right colon anastomosis.  The a sending polyp was inflammatory.  In the transverse and ascending colon there was severely active chronic colitis.  The descending colocolonic anastomosis showed no significant pathologic findings.  The distal sigmoid and rectum also were normal by biopsy.  There was no dysplasia.  He is just completing 8 weeks of budesonide therapy.  He has received his initial Stelara infusion and one subsequent injection.  He has tolerated this well.  He is also using Pentasa.  He is currently feeling very well.  His diarrhea is resolved.  His bowel movements have been formed and without  blood.  No abdominal pain.  His perianal rash is improved with nystatin ointment.   Review of Systems  As per HPI, otherwise negative  Current Medications, Allergies, Past Medical History, Past Surgical History, Family History and Social History were reviewed in [employer] record.     Objective:   Physical Exam BP (!) 106/52   Pulse (!) 59   Ht 5' 9"  (1.753 m)   Wt 228 lb (103.4 kg)   BMI 33.67 kg/m  Gen: awake, alert, NAD HEENT: anicteric  CV: RRR, no mrg Pulm: CTA b/l Abd: soft, NT/ND, +BS throughout Ext: no c/c/e Neuro: nonfocal      Assessment & Plan:  65 year old male with a history of ileocolonic visualizing and stricturing Crohn's disease dating back to 1985, prior right hemicolectomy, history of LOA and left hemicolectomy, history of IDA, gallstones with prior cholecystectomy, history of ankylosing spondylitis and prior DVT who is here for follow-up.   1.  Crohn's ileocolitis with history of fistula and stricture --colonoscopy proved active disease which was manifested by diarrhea for him.  We have switched from Cimzia (high antibody with 0 drug level at trough) to Stelara and low-dose azathioprine 50 mg daily.  He is also taking Pentasa.  Symptoms have come under good control. --Stop budesonide after the 8-week treatment; patient and family reminded to call me if diarrhea or symptoms recur as the budesonide goes away --Continue Stelara every 8 weeks --Continue azathioprine 50 mg daily --Continue Pentasa 1 g 4 times a day  2.  History of IDA --continue iron supplementation and continue to monitor CBC and iron studies  3.  B12 deficiency --continue IM B12 monthly  3 to 29-monthfollow-up with me  30 minutes total spent today including patient facing time, coordination of care, reviewing medical history/procedures/pertinent radiology studies, and documentation of the encounter.

## 2020-11-30 NOTE — Patient Instructions (Addendum)
If you are age 67 or older, your body mass index should be between 23-30. Your Body mass index is 33.67 kg/m. If this is out of the aforementioned range listed, please consider follow up with your Primary Care Provider.  Please continue Budesonide until finished.  Once finished you will STOP completely.  CONTINUE: Stelara Azathioprine 71m once daily Famotidine Pentasa  You will follow up in our office in June.  We will contact you to schedule the appointment.  Thank you for entrusting me with your care and choosing [facility]  Dr PHilarie Fredrickson

## 2020-12-01 NOTE — Progress Notes (Signed)
Copy of office note faxed to [employer] at fax [phone] and recall placed in Epic for 3 month follow up.

## 2020-12-27 ENCOUNTER — Inpatient Hospital Stay (HOSPITAL_COMMUNITY)
Admission: EM | Admit: 2020-12-27 | Discharge: 2020-12-31 | DRG: 872 | Disposition: A | Discharge status: Skilled Nursing Facility | Payer: Medicare Other | Source: Skilled Nursing Facility | Attending: Internal Medicine | Admitting: Internal Medicine

## 2020-12-27 ENCOUNTER — Emergency Department (HOSPITAL_COMMUNITY): Payer: Medicare Other

## 2020-12-27 ENCOUNTER — Inpatient Hospital Stay (HOSPITAL_COMMUNITY): Payer: Medicare Other

## 2020-12-27 DIAGNOSIS — E860 Dehydration: Secondary | ICD-10-CM | POA: Diagnosis present

## 2020-12-27 DIAGNOSIS — K50813 Crohn's disease of both small and large intestine with fistula: Secondary | ICD-10-CM | POA: Diagnosis present

## 2020-12-27 DIAGNOSIS — I5032 Chronic diastolic (congestive) heart failure: Secondary | ICD-10-CM | POA: Diagnosis present

## 2020-12-27 DIAGNOSIS — E559 Vitamin D deficiency, unspecified: Secondary | ICD-10-CM | POA: Diagnosis present

## 2020-12-27 DIAGNOSIS — A4189 Other specified sepsis: Principal | ICD-10-CM | POA: Diagnosis present

## 2020-12-27 DIAGNOSIS — I110 Hypertensive heart disease with heart failure: Secondary | ICD-10-CM | POA: Diagnosis present

## 2020-12-27 DIAGNOSIS — Z9049 Acquired absence of other specified parts of digestive tract: Secondary | ICD-10-CM

## 2020-12-27 DIAGNOSIS — I959 Hypotension, unspecified: Secondary | ICD-10-CM

## 2020-12-27 DIAGNOSIS — I350 Nonrheumatic aortic (valve) stenosis: Secondary | ICD-10-CM

## 2020-12-27 DIAGNOSIS — D509 Iron deficiency anemia, unspecified: Secondary | ICD-10-CM | POA: Diagnosis present

## 2020-12-27 DIAGNOSIS — A084 Viral intestinal infection, unspecified: Secondary | ICD-10-CM | POA: Diagnosis present

## 2020-12-27 DIAGNOSIS — Z885 Allergy status to narcotic agent status: Secondary | ICD-10-CM

## 2020-12-27 DIAGNOSIS — Z20822 Contact with and (suspected) exposure to covid-19: Secondary | ICD-10-CM | POA: Diagnosis present

## 2020-12-27 DIAGNOSIS — E861 Hypovolemia: Secondary | ICD-10-CM | POA: Diagnosis present

## 2020-12-27 DIAGNOSIS — R6520 Severe sepsis without septic shock: Secondary | ICD-10-CM | POA: Diagnosis present

## 2020-12-27 DIAGNOSIS — G40A09 Absence epileptic syndrome, not intractable, without status epilepticus: Secondary | ICD-10-CM | POA: Diagnosis present

## 2020-12-27 DIAGNOSIS — N39498 Other specified urinary incontinence: Secondary | ICD-10-CM | POA: Diagnosis not present

## 2020-12-27 DIAGNOSIS — Z8719 Personal history of other diseases of the digestive system: Secondary | ICD-10-CM

## 2020-12-27 DIAGNOSIS — I352 Nonrheumatic aortic (valve) stenosis with insufficiency: Secondary | ICD-10-CM | POA: Diagnosis present

## 2020-12-27 DIAGNOSIS — L8990 Pressure ulcer of unspecified site, unspecified stage: Secondary | ICD-10-CM | POA: Insufficient documentation

## 2020-12-27 DIAGNOSIS — N401 Enlarged prostate with lower urinary tract symptoms: Secondary | ICD-10-CM | POA: Diagnosis not present

## 2020-12-27 DIAGNOSIS — A09 Infectious gastroenteritis and colitis, unspecified: Secondary | ICD-10-CM | POA: Diagnosis not present

## 2020-12-27 DIAGNOSIS — F79 Unspecified intellectual disabilities: Secondary | ICD-10-CM

## 2020-12-27 DIAGNOSIS — E538 Deficiency of other specified B group vitamins: Secondary | ICD-10-CM | POA: Diagnosis present

## 2020-12-27 DIAGNOSIS — L89151 Pressure ulcer of sacral region, stage 1: Secondary | ICD-10-CM | POA: Diagnosis present

## 2020-12-27 DIAGNOSIS — N179 Acute kidney failure, unspecified: Secondary | ICD-10-CM | POA: Diagnosis present

## 2020-12-27 DIAGNOSIS — K529 Noninfective gastroenteritis and colitis, unspecified: Secondary | ICD-10-CM | POA: Diagnosis not present

## 2020-12-27 DIAGNOSIS — Z66 Do not resuscitate: Secondary | ICD-10-CM | POA: Diagnosis present

## 2020-12-27 DIAGNOSIS — Z8711 Personal history of peptic ulcer disease: Secondary | ICD-10-CM

## 2020-12-27 DIAGNOSIS — Z808 Family history of malignant neoplasm of other organs or systems: Secondary | ICD-10-CM

## 2020-12-27 DIAGNOSIS — Z79899 Other long term (current) drug therapy: Secondary | ICD-10-CM

## 2020-12-27 DIAGNOSIS — K5090 Crohn's disease, unspecified, without complications: Secondary | ICD-10-CM | POA: Diagnosis present

## 2020-12-27 DIAGNOSIS — E876 Hypokalemia: Secondary | ICD-10-CM | POA: Diagnosis present

## 2020-12-27 DIAGNOSIS — D7589 Other specified diseases of blood and blood-forming organs: Secondary | ICD-10-CM | POA: Diagnosis present

## 2020-12-27 DIAGNOSIS — Z800 Family history of malignant neoplasm of digestive organs: Secondary | ICD-10-CM

## 2020-12-27 LAB — CBC WITH DIFFERENTIAL/PLATELET
Abs Immature Granulocytes: 0.3 10*3/uL — ABNORMAL HIGH (ref 0.00–0.07)
Basophils Absolute: 0 10*3/uL (ref 0.0–0.1)
Basophils Relative: 0 %
Eosinophils Absolute: 0 10*3/uL (ref 0.0–0.5)
Eosinophils Relative: 0 %
HCT: 35.7 % — ABNORMAL LOW (ref 39.0–52.0)
Hemoglobin: 11.5 g/dL — ABNORMAL LOW (ref 13.0–17.0)
Lymphocytes Relative: 11 %
Lymphs Abs: 1 10*3/uL (ref 0.7–4.0)
MCH: 33.4 pg (ref 26.0–34.0)
MCHC: 32.2 g/dL (ref 30.0–36.0)
MCV: 103.8 fL — ABNORMAL HIGH (ref 80.0–100.0)
Monocytes Absolute: 1 10*3/uL (ref 0.1–1.0)
Monocytes Relative: 11 %
Neutro Abs: 7.1 10*3/uL (ref 1.7–7.7)
Neutrophils Relative %: 75 %
Platelets: 229 10*3/uL (ref 150–400)
Promyelocytes Relative: 3 %
RBC: 3.44 MIL/uL — ABNORMAL LOW (ref 4.22–5.81)
RDW: 14.2 % (ref 11.5–15.5)
WBC: 9.4 10*3/uL (ref 4.0–10.5)
nRBC: 0 % (ref 0.0–0.2)
nRBC: 0 /100 WBC

## 2020-12-27 LAB — URINALYSIS, ROUTINE W REFLEX MICROSCOPIC
Bacteria, UA: NONE SEEN
Bilirubin Urine: NEGATIVE
Glucose, UA: NEGATIVE mg/dL
Ketones, ur: NEGATIVE mg/dL
Leukocytes,Ua: NEGATIVE
Nitrite: NEGATIVE
Protein, ur: 30 mg/dL — AB
Specific Gravity, Urine: 1.018 (ref 1.005–1.030)
pH: 5 (ref 5.0–8.0)

## 2020-12-27 LAB — CBG MONITORING, ED: Glucose-Capillary: 116 mg/dL — ABNORMAL HIGH (ref 70–99)

## 2020-12-27 LAB — I-STAT VENOUS BLOOD GAS, ED
Acid-base deficit: 10 mmol/L — ABNORMAL HIGH (ref 0.0–2.0)
Bicarbonate: 15.3 mmol/L — ABNORMAL LOW (ref 20.0–28.0)
Calcium, Ion: 0.98 mmol/L — ABNORMAL LOW (ref 1.15–1.40)
HCT: 33 % — ABNORMAL LOW (ref 39.0–52.0)
Hemoglobin: 11.2 g/dL — ABNORMAL LOW (ref 13.0–17.0)
O2 Saturation: 99 %
Potassium: 4.5 mmol/L (ref 3.5–5.1)
Sodium: 138 mmol/L (ref 135–145)
TCO2: 16 mmol/L — ABNORMAL LOW (ref 22–32)
pCO2, Ven: 29.5 mmHg — ABNORMAL LOW (ref 44.0–60.0)
pH, Ven: 7.322 (ref 7.250–7.430)
pO2, Ven: 156 mmHg — ABNORMAL HIGH (ref 32.0–45.0)

## 2020-12-27 LAB — PROTIME-INR
INR: 1.3 — ABNORMAL HIGH (ref 0.8–1.2)
Prothrombin Time: 15.8 seconds — ABNORMAL HIGH (ref 11.4–15.2)

## 2020-12-27 LAB — LACTIC ACID, PLASMA
Lactic Acid, Venous: 2.2 mmol/L (ref 0.5–1.9)
Lactic Acid, Venous: 3.8 mmol/L (ref 0.5–1.9)
Lactic Acid, Venous: 1.8 mmol/L (ref 0.5–1.9)

## 2020-12-27 LAB — APTT: aPTT: 27 seconds (ref 24–36)

## 2020-12-27 LAB — COMPREHENSIVE METABOLIC PANEL
ALT: 12 U/L (ref 0–44)
AST: 17 U/L (ref 15–41)
Albumin: 2.5 g/dL — ABNORMAL LOW (ref 3.5–5.0)
Alkaline Phosphatase: 65 U/L (ref 38–126)
Anion gap: 20 — ABNORMAL HIGH (ref 5–15)
BUN: 75 mg/dL — ABNORMAL HIGH (ref 8–23)
CO2: 11 mmol/L — ABNORMAL LOW (ref 22–32)
Calcium: 7.3 mg/dL — ABNORMAL LOW (ref 8.9–10.3)
Chloride: 107 mmol/L (ref 98–111)
Creatinine, Ser: 6.36 mg/dL — ABNORMAL HIGH (ref 0.61–1.24)
GFR, Estimated: 9 mL/min — ABNORMAL LOW (ref >60)
Glucose, Bld: 98 mg/dL (ref 70–99)
Potassium: 4.8 mmol/L (ref 3.5–5.1)
Sodium: 138 mmol/L (ref 135–145)
Total Bilirubin: 0.5 mg/dL (ref 0.3–1.2)
Total Protein: 6.6 g/dL (ref 6.5–8.1)

## 2020-12-27 LAB — GLUCOSE, CAPILLARY: Glucose-Capillary: 100 mg/dL — ABNORMAL HIGH (ref 70–99)

## 2020-12-27 LAB — RESP PANEL BY RT-PCR (FLU A&B, COVID) ARPGX2
Influenza A by PCR: NEGATIVE
Influenza B by PCR: NEGATIVE
SARS Coronavirus 2 by RT PCR: NEGATIVE

## 2020-12-27 LAB — SEDIMENTATION RATE: Sed Rate: 49 mm/hr — ABNORMAL HIGH (ref 0–16)

## 2020-12-27 MED ORDER — PRIMIDONE 250 MG PO TABS
250.0000 mg | ORAL_TABLET | Freq: Every day | ORAL | Status: DC
  Administered 2020-12-28 – 2020-12-31 (×4): 250 mg via ORAL
  Filled 2020-12-27 (×4): qty 1

## 2020-12-27 MED ORDER — SODIUM CHLORIDE 0.9 % IV SOLN
2.0000 g | Freq: Once | INTRAVENOUS | Status: AC
  Administered 2020-12-27: 2 g via INTRAVENOUS
  Filled 2020-12-27: qty 2

## 2020-12-27 MED ORDER — PRIMIDONE 250 MG PO TABS
500.0000 mg | ORAL_TABLET | Freq: Every day | ORAL | Status: DC
  Administered 2020-12-27 – 2020-12-30 (×4): 500 mg via ORAL
  Filled 2020-12-27 (×5): qty 2

## 2020-12-27 MED ORDER — BACID PO TABS
2.0000 | ORAL_TABLET | Freq: Three times a day (TID) | ORAL | Status: DC
  Administered 2020-12-27 – 2020-12-30 (×9): 2 via ORAL
  Filled 2020-12-27 (×9): qty 2

## 2020-12-27 MED ORDER — LORATADINE 10 MG PO TABS
10.0000 mg | ORAL_TABLET | Freq: Every day | ORAL | Status: DC
  Administered 2020-12-28 – 2020-12-31 (×4): 10 mg via ORAL
  Filled 2020-12-27 (×4): qty 1

## 2020-12-27 MED ORDER — LACTATED RINGERS IV BOLUS (SEPSIS)
1000.0000 mL | Freq: Once | INTRAVENOUS | Status: AC
  Administered 2020-12-27: 1000 mL via INTRAVENOUS

## 2020-12-27 MED ORDER — IPRATROPIUM BROMIDE 0.06 % NA SOLN
1.0000 | Freq: Two times a day (BID) | NASAL | Status: DC
  Administered 2020-12-27 – 2020-12-31 (×8): 1 via NASAL
  Filled 2020-12-27: qty 15

## 2020-12-27 MED ORDER — METRONIDAZOLE IN NACL 5-0.79 MG/ML-% IV SOLN
500.0000 mg | Freq: Once | INTRAVENOUS | Status: AC
  Administered 2020-12-27: 500 mg via INTRAVENOUS
  Filled 2020-12-27: qty 100

## 2020-12-27 MED ORDER — FAMOTIDINE 20 MG PO TABS
20.0000 mg | ORAL_TABLET | Freq: Every day | ORAL | Status: DC
Start: 2020-12-27 — End: 2020-12-31
  Administered 2020-12-28 – 2020-12-31 (×4): 20 mg via ORAL
  Filled 2020-12-27 (×4): qty 1

## 2020-12-27 MED ORDER — HEPARIN SODIUM (PORCINE) 5000 UNIT/ML IJ SOLN
5000.0000 [IU] | Freq: Three times a day (TID) | INTRAMUSCULAR | Status: DC
  Administered 2020-12-27 – 2020-12-31 (×11): 5000 [IU] via SUBCUTANEOUS
  Filled 2020-12-27 (×11): qty 1

## 2020-12-27 MED ORDER — SODIUM CHLORIDE 0.9 % IV SOLN: 2.0000 g | INTRAVENOUS | Status: DC

## 2020-12-27 MED ORDER — TRIAMCINOLONE ACETONIDE 0.1 % EX CREA
1.0000 "application " | TOPICAL_CREAM | Freq: Every day | CUTANEOUS | Status: DC
  Administered 2020-12-28 – 2020-12-31 (×3): 1 via TOPICAL
  Filled 2020-12-27: qty 15

## 2020-12-27 MED ORDER — ACETAMINOPHEN 325 MG PO TABS: 325.0000 mg | ORAL_TABLET | Freq: Four times a day (QID) | ORAL | Status: DC | PRN

## 2020-12-27 MED ORDER — TAMSULOSIN HCL 0.4 MG PO CAPS
0.4000 mg | ORAL_CAPSULE | Freq: Every day | ORAL | Status: DC
  Administered 2020-12-28 – 2020-12-31 (×4): 0.4 mg via ORAL
  Filled 2020-12-27 (×4): qty 1

## 2020-12-27 MED ORDER — LACTATED RINGERS IV SOLN: INTRAVENOUS | Status: DC

## 2020-12-27 MED ORDER — FERROUS SULFATE 325 (65 FE) MG PO TABS
325.0000 mg | ORAL_TABLET | Freq: Every day | ORAL | Status: DC
  Administered 2020-12-28 – 2020-12-31 (×4): 325 mg via ORAL
  Filled 2020-12-27 (×4): qty 1

## 2020-12-27 MED ORDER — VANCOMYCIN HCL 1750 MG/350ML IV SOLN
1750.0000 mg | Freq: Once | INTRAVENOUS | Status: DC
  Administered 2020-12-27: 1750 mg via INTRAVENOUS
  Filled 2020-12-27: qty 350

## 2020-12-27 MED ORDER — MESALAMINE ER 250 MG PO CPCR
1000.0000 mg | ORAL_CAPSULE | Freq: Four times a day (QID) | ORAL | Status: DC
  Administered 2020-12-27 – 2020-12-31 (×15): 1000 mg via ORAL
  Filled 2020-12-27 (×20): qty 4

## 2020-12-27 MED ORDER — LACTATED RINGERS IV BOLUS (SEPSIS)
500.0000 mL | Freq: Once | INTRAVENOUS | Status: AC
  Administered 2020-12-27: 500 mL via INTRAVENOUS

## 2020-12-27 MED ORDER — VANCOMYCIN VARIABLE DOSE PER UNSTABLE RENAL FUNCTION (PHARMACIST DOSING): Status: DC

## 2020-12-27 MED ORDER — SODIUM BICARBONATE 8.4 % IV SOLN
INTRAVENOUS | Status: AC
  Filled 2020-12-27 (×4): qty 850

## 2020-12-27 NOTE — ED Notes (Signed)
hospitalist at bedside

## 2020-12-27 NOTE — Progress Notes (Signed)
Following for code sepsis 

## 2020-12-27 NOTE — ED Notes (Signed)
Dinner Tray Ordered @ 1700.

## 2020-12-27 NOTE — ED Notes (Signed)
Entered room around 2015 to take pt upstairs to find both IV's have been removed.  PT is difficult stick.  Attempted twice unsuccessfully. Charge notified. IV team consult being ordered

## 2020-12-27 NOTE — Progress Notes (Signed)
Notified bedside nurse of need to draw repeat lactic acid (#3).

## 2020-12-27 NOTE — ED Notes (Signed)
Lamont Snowball (sister and Henrietta Dine) [phone] would like an update asap, says she's been calling all day

## 2020-12-27 NOTE — ED Notes (Signed)
Spoke with sister and updated.  She is primary contact and would like [place] to reach out to her with any questions or concerns/updates.

## 2020-12-27 NOTE — Progress Notes (Signed)
Notified provider of need to repeat lactate @ 1612 since last one increased, bedside RN aware

## 2020-12-27 NOTE — Progress Notes (Signed)
Pharmacy Antibiotic Note  [name] is a 65 y.o. male admitted on 12/27/2020 with sepsis of an unknown source.  Pharmacy has been consulted for Cefepime and Vancomycin dosing. Pt has an AKI with admission Scr of 6.36 (BL 0.8-1.2).  Plan: Vancomycin 1750 mg IV x 1. Further dosing based on levels/renal recovery. Cefepime 2 g IV q24h Monitor renal function, clinical progression and treatment plan.    Temp (24hrs), Avg:97.8 F (36.6 C), Min:97.8 F (36.6 C), Max:97.8 F (36.6 C)  Recent Labs  Lab 12/27/20 1151 12/27/20 1156  CREATININE 6.36*  --   LATICACIDVEN  --  2.2*    CrCl cannot be calculated (Unknown ideal weight.).   ~13 mL/min based off of 100 kg reported weight  Allergies  Allergen Reactions  . Darvon [Propoxyphene] Other (See Comments)    Unknown reaction  . Other Other (See Comments)    Ask    Antimicrobials this admission: Cefepime 3/7 >>  Vancomycin 3/7 >>  Metronidazole 3/7 >>  Dose adjustments this admission: Antibiotic dosing adjusted per renal function  Microbiology results: 3/7 BCx: pending 3/7 UCx: pending  3/7 Resp Panel: pending  Thank you for allowing pharmacy to be a part of this patient's care.  Jacobo Forest PharmD Candidate 2022 12/27/2020 1:56 PM

## 2020-12-27 NOTE — ED Notes (Signed)
[place] at bedside for [place] of abdomen

## 2020-12-27 NOTE — Progress Notes (Signed)
Patient arrived to unit from ED. Able to transfer self from stretcher to bed. Patient is actively having diarrhea, will continue to monitor and reach out to physician for any new changes.

## 2020-12-27 NOTE — Progress Notes (Signed)
CBC reviewed, PLT>200, TTP unlikely.

## 2020-12-27 NOTE — ED Notes (Signed)
Pt's wife has gone home. Requested pt be placed on posey alarm.  Also requested to update sister with any developments.

## 2020-12-27 NOTE — Consult Note (Addendum)
[facility] Consult: 2:25 PM 12/27/2020 Monday  LOS: 0 days    Referring Provider: Dr Wynetta Fines  Primary Care Physician:  Leonard Downing, MD Primary Gastroenterologist:  [name]    Reason for Consultation:  Brain Hilts, diarrhea   HPI: [name] is a 65 y.o. male.  PMH:  Echo 12/2015 with LVEF 50 to 36%, grade 2 diastolic dysfunction.  Moderate aortic valve stenosis and regurge, trivial pericardial effusion. Seizure disorder.  Mental retardation.  Lap cholecystectomy 05/2018, no choledocholithiasis on IOC.  Ankylosing spondylitis.  IDA.  B12 deficiency. SB and colonic Crohn's disease with fistula and stricturing disease dates back to the early 1980s.   1980s right ileocecectomy. 11/2015 laparoscopic LOA, SB resection, left hemicolectomy, left transverse colon and colostomy with Hartmann pouch to address SBO. 07/2016 laparoscopic closure of colostomy. S/p multiple colonoscopies.  Latest:  09/27/2020 colonoscopy.  For reassessment of disease activity.  Edema, erythema, mild stenosis, ulceration consistent with active disease at ileocolonic anastomosis (severe, active chronic ileo-colitis).  Sessile, 4 mm polyp snared from ascending colon (inflammatory polyp).  Transverse, ascending colon proximal to anastomosis normal-appearing but biopsies obtained (severely active chronic colitis, no dysplasia, no granulomata at transverse.  No significant pathology in the descending, colocolonic anastomosis rectum or distal sigmoid).  Perianal fungal rash. 09/27/2020 EGD.  For dysphagia.  Esophagus normal but empirically dilated.  Segmental, moderate inflammation with adherent blood and erythema at the greater curvature of stomach, biopsied ( mild reactive gastropathy and gastritis, no H Pylori).  Examined duodenum normal.    After reviewing pathology [name] communicated patient to continue recently initiated Stelara in addition to low-dose azathioprine and mesalamine.  Added 8 weeks Entocort therapy.  Planned colonoscopy in 2 years. 04/2018 latest CTAP w contrast: Cholelithiasis suspicion for acute cholecystitis, previous right hemicolectomy.  Unremarkable liver. 11/30/2020 latest office visit with [name]: The switch from Cimzia to Stelara along with low-dose azathioprine, 8 weeks budesonide resulted in control of symptoms.  Plan was to continue Stelara every 2 months, azathioprine 50 mg daily, Pentasa 1 g qid.  Also was to continue monthly IM B12 injection and oral iron supplements.    Beginning last Thursday after lunch where he ate a hot dog with mustard and an unspecified drink, he developed nonbloody nausea and vomiting and watery/loose, brown, nonbloody diarrhea.  No abdominal pain.  Multiple episodes of N/V/D but no abdominal pain.  Symptoms quite unusual for him.  His Crohn's was well controlled and he was having up to 2 formed brown stools daily prior to these acute symptoms.  No particular chills or sweats.  Symptoms persisted through yesterday but resolved overnight.  Although he has had an episode of diarrhea in the ED, there is been no nausea or vomiting and he actually feels like he could tolerate some liquids. Pt denies sick contacts at his SNF. Patient's next Stelara dose is due on 01/07/2021.  BPs 70s over 40s-50s, heart rate low 100s, no fever at arrival.  Pressures improved but still low at 109/50, heart rate 102 fluid boluses.  Labs significant for AKI, BUN/creatinine 75/6.3 compared to 23/1.2 in early October 2021. With the exception of albumin 2.5, LFTs normal. Venous lactic acid 2.2. PT/INR 15.8/1.3. Patient receiving his third liter of lactated Ringer's, dosed with cefepime, metronidazole and vancomycin.  Family history of colon cancer and dementia in his mother, skin cancer in his father.    Patient lives at [facility].  No alcohol, no tobacco products.    Past Medical History:  Diagnosis Date  . Abscess of anal and rectal regions   . Allergic rhinitis   . Aortic valve insufficiency    non-rheumatic   . BPH (benign prostatic hyperplasia)   . CHF (congestive heart failure) (HCC)     chronic diastolic heart failure   . Cholelithiasis   . Crohn's disease ([place])   . Dizziness and giddiness   . DVT (deep venous thrombosis) ([place])   . Dysphagia   . Epilepsy ([place])    "petit mal; only at home" (6/9//2015)  . Heart murmur   . Hepatomegaly   . History of blood transfusion ~ 1992   "related to my Crohn's"  . History of immunosuppression therapy   . History of stomach ulcers   . Hyperplastic colon polyp   . Hypokalemia    hx of   . Iron deficiency anemia, unspecified    iron deficiency anemia   . Malaise   . Mental disability   . Muscle weakness (generalized)   . Other B-complex deficiencies   . REGIONAL ENTERITIS, LARGE INTESTINE 10/05/2000   Qualifier: Diagnosis of  By: Jerral Ralph    . S/P partial colectomy for Crohn's disease 12/01/2015  . SBO (small bowel obstruction) ([place]) 12/01/2015  . Sepsis ([place])    hx of   . Severe protein-calorie malnutrition Altamease Oiler: less than 60% of standard weight) ([place])   . Syncope 03/31/2014  . Syncope and collapse 03/31/2014   "while driving"  . Tremor   . Unsteadiness on feet   . Urticaria   . Vitamin D deficiency     Past Surgical History:  Procedure Laterality Date  . CHOLECYSTECTOMY N/A 06/06/2018   Procedure: LAPAROSCOPIC CHOLECYSTECTOMY WITH INTRAOPERATIVE CHOLANGIOGRAM ERAS PATHWAY;  Surgeon: Alphonsa Overall, MD;  Location: [facility];  Service: General;  Laterality: N/A;  . COLON SURGERY     Anastomosis 45cm from anal verge c/w deswecending colon anastomosis  . COLONOSCOPY  2013, 2016  . COLOSTOMY TAKEDOWN N/A 08/01/2016   Procedure: LAPAROSCOPIC COLOSTOMY REVERSAL;  Surgeon: Alphonsa Overall, MD;   Location: [facility];  Service: General;  Laterality: N/A;  . [place]   ileocectomy   . HERNIA REPAIR    . LAPAROSCOPY N/A 12/04/2015   Procedure: LAPAROSCOPY DIAGNOSTIC, LAPAROSCOPIC INTEROLYSIS OF ADHESIONS, HAND- ASSISTED SMALL BOWEL RESECTION, MOBILIZATION OF SPLENIC FLEXURE, LEFT COLECTOMY WITH CREATION OF COLOSTOMY (HARTMAN'S PROCEDURE);  Surgeon: Alphonsa Overall, MD;  Location: [facility];  Service: General;  Laterality: N/A;  . PILONIDAL CYST EXCISION  1980's  . TONSILLECTOMY  ~ 1967  . UMBILICAL HERNIA REPAIR  ~ 1983    Prior to Admission medications   Medication Sig Start Date End Date Taking? Authorizing Provider  cetaphil (CETAPHIL) lotion Apply 1 application topically at bedtime. Apply to arms,legs,torso topically at bedtime for dry skin   Yes [provider]  selenium sulfide (SELSUN) 1 % LOTN Apply 1 application topically See admin instructions. Apply to beard/eyebrows topically every evening shift every Mon, Wed,Fri for dandruff   Yes [provider]  acetaminophen (TYLENOL)  325 MG tablet Take 325 mg by mouth every 6 (six) hours as needed.    [provider]  acetaminophen (TYLENOL) 500 MG tablet Take 500 mg by mouth at bedtime.    [provider]  azaTHIOprine (IMURAN) 50 MG tablet Take 1 tablet (50 mg total) by mouth daily. 08/10/20   Pyrtle, Lajuan Lines, MD  Cyanocobalamin (VITAMIN B-12 IJ) Inject 1 mL as directed every 30 (thirty) days.    [provider]  famotidine (PEPCID) 20 MG tablet Take 20 mg by mouth 2 (two) times daily.    [provider]  ferrous sulfate 325 (65 FE) MG tablet Take 325 mg by mouth daily with breakfast.    [provider]  furosemide (LASIX) 20 MG tablet Take 1 tablet (20 mg total) by mouth 2 (two) times daily. 01/26/20   Patwardhan, Reynold Bowen, MD  guaifenesin (ROBITUSSIN) 100 MG/5ML syrup Take 200 mg by mouth every 6 (six) hours as needed for cough (Do not exceed 4 doses in 24 hours. If cough has  not improved in 24 hours notify physician.).    [provider]  ipratropium (ATROVENT) 0.03 % nasal spray Place 1 spray into both nostrils 2 (two) times daily.  10/22/16   [provider]  levETIRAcetam (KEPPRA) 500 MG tablet Take 500 mg by mouth 2 (two) times daily.    [provider]  lisinopril (ZESTRIL) 5 MG tablet Take 1 tablet by mouth daily. 07/03/19   [provider]  loratadine (CLARITIN) 10 MG tablet Take 10 mg by mouth daily.    [provider]  mesalamine (PENTASA) 500 MG CR capsule Take 1,000 mg by mouth 4 (four) times daily. Opened in applesauce     [provider]  Multiple Vitamins-Iron (MULTIVITAMINS WITH IRON) TABS tablet Take 1 tablet by mouth daily.    [provider]  nystatin ointment (MYCOSTATIN) Apply 1 application topically 3 (three) times daily. 09/27/20   Pyrtle, Lajuan Lines, MD  primidone (MYSOLINE) 250 MG tablet Take 1 tab in AM, 2 tabs in PM 11/26/18   Cameron Sprang, MD  STELARA 90 MG/ML SOSY injection Inject 90 mg into the skin every 8 (eight) weeks. 10/04/20   [provider]  tamsulosin (FLOMAX) 0.4 MG CAPS capsule Take 1 capsule (0.4 mg total) by mouth daily. 04/12/18   Pattricia Boss, MD  triamcinolone cream (KENALOG) 0.1 % Apply 1 application topically as directed. 05/02/19   [provider]  Vitamin D, Ergocalciferol, (DRISDOL) 1.25 MG (50000 UT) CAPS capsule Take 50,000 Units by mouth every 30 (thirty) days.     [provider]  zinc oxide 20 % ointment Apply 1 application topically as needed for irritation.    [provider]    Scheduled Meds:  Infusions: . lactated ringers 1,000 mL (12/27/20 1411)   And  . lactated ringers    . lactated ringers    . vancomycin 1,750 mg (12/27/20 1350)   PRN Meds:    Allergies as of 12/27/2020 - Review Complete 12/27/2020  Allergen Reaction Noted  . Darvon [propoxyphene] Other (See Comments) 04/19/2018  . Other Other (See  Comments) 05/16/2017    Family History  Problem Relation Age of Onset  . Skin cancer Father   . Colon cancer Mother   . Dementia Mother   . Seizures Neg Hx   . Crohn's disease Neg Hx   . Esophageal cancer Neg Hx   . Rectal cancer Neg Hx   . Stomach cancer  Neg Hx     Social History   Socioeconomic History  . Marital status: Single    Spouse name: Not on file  . Number of children: 0  . Years of education: Not on file  . Highest education level: Not on file  Occupational History  . Occupation: Disabled  Tobacco Use  . Smoking status: Never Smoker  . Smokeless tobacco: Never Used  Vaping Use  . Vaping Use: Never used  Substance and Sexual Activity  . Alcohol use: No    Alcohol/week: 0.0 standard drinks  . Drug use: No  . Sexual activity: Never  Other Topics Concern  . Not on file  Social History Narrative   Lives at [place] Strain: Not on file  Food Insecurity: Not on file  Transportation Needs: Not on file  Physical Activity: Not on file  Stress: Not on file  Social Connections: Not on file  Intimate Partner Violence: Not on file    REVIEW OF SYSTEMS: Constitutional:   Denies dizziness but feels weak. ENT:  No nose bleeds Pulm: No shortness of breath or cough. CV:  No palpitations, no LE edema.  No chest pain.  No PND. GU:  No hematuria, no frequency GI:   See HPI.  No dysphagia leading up to current symptoms. Heme: [place] unusual or excessive bleeding or bruising Transfusions: No records of previous blood transfusions. Neuro:  No headaches, no peripheral tingling or numbness.  No recent seizure.  No syncope Derm:  No itching, no rash or sores.  Endocrine:  No sweats or chills.  No polyuria or dysuria Immunization: Says he has received 4 vaccines for COVID-19 Travel:  None beyond local counties in last few months.    PHYSICAL EXAM: Vital signs in last 24 hours: Vitals:   12/27/20 1400 12/27/20  1415  BP: (!) 108/50 (!) 109/50  Pulse: (!) 102 (!) 102  Resp: 17 17  Temp:    SpO2: 99% 99%   Wt Readings from Last 3 Encounters:  11/30/20 103.4 kg  09/27/20 122 kg  07/27/20 122 kg    General: Patient lying comfortably on stretcher in bed.  Does not look toxic or acutely ill. Head: No facial asymmetry or swelling.  No signs of head trauma. Eyes: No scleral icterus or conjunctival pallor.  Amblyopia on right eye Ears: Not hard of hearing Nose: No congestion or discharge Mouth: Oral mucosa is dry but clear.  Dental condition is fair to poor.  Tongue midline. Neck: No JVD, no masses, no thyromegaly Lungs: Crackles in the bases.  No respiratory distress or cough. Heart: RRR.  No MRG.  S1, S2 present Abdomen: Not tender, not distended.  Soft.  Active bowel sounds.  No HSM, masses, bruits, hernias. GU: Condom catheter attached to intermittent suction.  There is no urine in the tubing or canister. Rectal: No DRE performed but there is liquid medium brown, nonbloody stool pooled between his upper thighs. Musc/Skeltl: No joint redness, swelling or gross deformity. Extremities: No CCE.  Feet are warm. Neurologic: Alert.  Appropriate.  Oriented x3.  Moves all 4 limbs without tremor, strength not tested. Skin: Erythema in skin of the central face, question rosacea vs seb dermatitis Tattoos:  None seen Nodes: No cervical adenopathy. Psych: Calm, cooperative, pleasant.  Intake/Output from previous day: No intake/output data recorded. Intake/Output this shift: No intake/output data recorded.  LAB RESULTS: No results for input(s): WBC, HGB, HCT, PLT in the last  72 hours. BMET Lab Results  Component Value Date   NA 138 12/27/2020   NA 135 07/27/2020   NA 139 06/06/2018   K 4.8 12/27/2020   K 4.8 07/27/2020   K 4.6 06/06/2018   CL 107 12/27/2020   CL 105 07/27/2020   CL 105 06/06/2018   CO2 11 (L) 12/27/2020   CO2 25 07/27/2020   CO2 23 06/06/2018   GLUCOSE 98 12/27/2020    GLUCOSE 96 07/27/2020   GLUCOSE 99 06/06/2018   BUN 75 (H) 12/27/2020   BUN 23 07/27/2020   BUN 16 06/06/2018   CREATININE 6.36 (H) 12/27/2020   CREATININE 1.29 07/27/2020   CREATININE 0.83 06/06/2018   CALCIUM 7.3 (L) 12/27/2020   CALCIUM 8.3 (L) 07/27/2020   CALCIUM 8.8 (L) 06/06/2018   LFT Recent Labs    12/27/20 1151  PROT 6.6  ALBUMIN 2.5*  AST 17  ALT 12  ALKPHOS 65  BILITOT 0.5   PT/INR Lab Results  Component Value Date   INR 1.3 (H) 12/27/2020   INR 1.08 08/09/2016   INR 1.20 08/08/2016   Hepatitis Panel No results for input(s): HEPBSAG, HCVAB, HEPAIGM, HEPBIGM in the last 72 hours. C-Diff No components found for: CDIFF  DG Chest Port 1 View  Result Date: 12/27/2020 CLINICAL DATA:  Questionable sepsis EXAM: PORTABLE CHEST 1 VIEW COMPARISON:  July 2019 FINDINGS: Low lung volumes. No consolidation or edema. No significant pleural effusion. No pneumothorax. Similar cardiomediastinal contours. IMPRESSION: No acute process in the chest. Electronically Signed   By: Macy Mis M.D.   On: 12/27/2020 12:49     IMPRESSION:   *    Acute nausea, vomiting, diarrhea.  All of it nonbloody.  No abdominal pain. Suspect acute viral GI illness not Crohn's flare. So far received metronidazole, vancomycin, cefepime as he meets sepsis criteria.  Not clear from a GI standpoint that antibiotics are necessary and agree with Dr. Roosevelt Locks in holding further antibiotic.  *     AKI.  Looks to be oliguric.  Just completed his third liter of LR.    *    Longstanding history of Crohn's ileocolitis.  Previous surgeries, bowel resections, SBO's, LOA.  For at least a few months symptoms have been stable on every 2 month Stelara, daily azathioprine and Pentasa.  Next dose of Stelara is due 01/07/2021.  *    Hypokalemia.    PLAN:     *    Has yet to produce any urine, so likely needs more fluid but he does have some crackles in the base of both lungs so need to be careful and watch for  volume overload/CHF  *   Awaiting CBC which is in process.  Note, for comparison, on 12/22/2020 Hgb was 12.6, platelets 265 and WBCs 4.4.  *   Note that renal diet has been ordered and this is fine so long as he tolerates, if not regress diet to clear liquids.  *   Admitting physician, Dr. Roosevelt Locks mentioned holding further antibiotics but I note that the patient continues with active orders for Maxipime.  Will message Dr. Roosevelt Locks to clarify his wishes.   Azucena Freed  12/27/2020, 2:25 PM Phone [phone]  ________________________________________________________________________  Velora Heckler GI MD note:  I personally examined the patient, reviewed the data and agree with the assessment and plan described above.  65 yo man with complicated Crohn's disease that has been in remission clinically for the past few months on stelara, azathiaprine and  pentasa.  He's been having 1-2 BMs daily, no abd pains, no bleeding and then had an acute diarrheal/vomiting illness that started 4 days ago, lasted 2-3 days. No vomiting or diarrhea in 12-24 hours now. Never noticed any bleeding, never abdominal pains.  No fevers or chills.    Admitted with hypotension, very dehydrated with significant AKI.  CBC pending. CXR negative.  No urine sample yet.  I suspect this acute vomiting, diarrheal illness is not related to his Crohn's. Rather it is probably an infectious gastroenteritis.  Most likely viral etiology.  His symptoms have already improved in past 12-24 hours (no vomiting or diarrhea). He needs to recover from the hypovolemia, AKI that has resulted with supportive care, IVF.  I don't think IV antibiotics are necessary unless new data supports a bacterial process (urine not sent because he's had no urine output).  OK for renal diet or clears if he does not tolerate that.    We will follow along.  Would hold his azathiaprin, pentasa until good PO intake at least.   Owens Loffler, MD [facility] Pager  [phone]

## 2020-12-27 NOTE — ED Provider Notes (Signed)
[facility] EMERGENCY DEPARTMENT Provider Note   CSN: [id number] Arrival date & time: 12/27/20  1137     History No chief complaint on file.   [name] is a 65 y.o. male.  65 year old male presents with several days of vomiting and diarrhea from facility.  Unknown if patient has had a fever.  Patient has a history of intellectual disability and is a DNR.  Reportedly tested negative for Covid this morning.  Has not had any cough or congestion.  Denies any abdominal discomfort.  EMS was called and patient found to be hypotensive and given 900 of saline with good response.  Transported here for further management        Past Medical History:  Diagnosis Date  . Abscess of anal and rectal regions   . Allergic rhinitis   . Aortic valve insufficiency    non-rheumatic   . BPH (benign prostatic hyperplasia)   . CHF (congestive heart failure) (HCC)     chronic diastolic heart failure   . Cholelithiasis   . Crohn's disease ([place])   . Dizziness and giddiness   . DVT (deep venous thrombosis) ([place])   . Dysphagia   . Epilepsy ([place])    "petit mal; only at home" (6/9//2015)  . Heart murmur   . Hepatomegaly   . History of blood transfusion ~ 1992   "related to my Crohn's"  . History of immunosuppression therapy   . History of stomach ulcers   . Hyperplastic colon polyp   . Hypokalemia    hx of   . Iron deficiency anemia, unspecified    iron deficiency anemia   . Malaise   . Mental disability   . Muscle weakness (generalized)   . Other B-complex deficiencies   . REGIONAL ENTERITIS, LARGE INTESTINE 10/05/2000   Qualifier: Diagnosis of  By: Jerral Ralph    . S/P partial colectomy for Crohn's disease 12/01/2015  . SBO (small bowel obstruction) ([place]) 12/01/2015  . Sepsis ([place])    hx of   . Severe protein-calorie malnutrition Altamease Oiler: less than 60% of standard weight) ([place])   . Syncope 03/31/2014  . Syncope and collapse 03/31/2014   "while driving"  .  Tremor   . Unsteadiness on feet   . Urticaria   . Vitamin D deficiency     Patient Active Problem List   Diagnosis Date Noted  . Leg edema 07/30/2019  . Cholecystitis with cholelithiasis 06/06/2018  . Diastolic CHF, chronic ([place]) 04/20/2018  . Catheter-associated urinary tract infection ([place] ) 04/19/2018  . Mental disability   . S/P colostomy takedown 08/01/2016  . Crohn's disease of both small and large intestine with fistula ([place])   . Sepsis ([place]) 12/01/2015  . Localization-related idiopathic epilepsy and epileptic syndromes with seizures of localized onset, not intractable, without status epilepticus ([place]) 03/30/2015  . Mild aortic stenosis 03/31/2014  . Nonrheumatic aortic valve insufficiency 03/31/2014  . Pseudopolyposis of colon ([place]) 01/03/2012  . Crohn's disease ([place]) 12/19/2011  . Personal history of immunosupression therapy 12/19/2011  . ECZEMA 08/21/2008  . CHOLELITHIASIS, ASYMPTOMATIC 01/29/2008  . VITAMIN B12 DEFICIENCY 01/17/2008    Past Surgical History:  Procedure Laterality Date  . CHOLECYSTECTOMY N/A 06/06/2018   Procedure: LAPAROSCOPIC CHOLECYSTECTOMY WITH INTRAOPERATIVE CHOLANGIOGRAM ERAS PATHWAY;  Surgeon: Alphonsa Overall, MD;  Location: [facility];  Service: General;  Laterality: N/A;  . COLON SURGERY     Anastomosis 45cm from anal verge c/w deswecending colon anastomosis  . COLONOSCOPY  2013, 2016  .  COLOSTOMY TAKEDOWN N/A 08/01/2016   Procedure: LAPAROSCOPIC COLOSTOMY REVERSAL;  Surgeon: Alphonsa Overall, MD;  Location: [facility];  Service: General;  Laterality: N/A;  . [place]   ileocectomy   . HERNIA REPAIR    . LAPAROSCOPY N/A 12/04/2015   Procedure: LAPAROSCOPY DIAGNOSTIC, LAPAROSCOPIC INTEROLYSIS OF ADHESIONS, HAND- ASSISTED SMALL BOWEL RESECTION, MOBILIZATION OF SPLENIC FLEXURE, LEFT COLECTOMY WITH CREATION OF COLOSTOMY (HARTMAN'S PROCEDURE);  Surgeon: Alphonsa Overall, MD;  Location: [facility];  Service: General;  Laterality: N/A;  . PILONIDAL CYST  EXCISION  1980's  . TONSILLECTOMY  ~ 1967  . UMBILICAL HERNIA REPAIR  ~ 1983       Family History  Problem Relation Age of Onset  . Skin cancer Father   . Colon cancer Mother   . Dementia Mother   . Seizures Neg Hx   . Crohn's disease Neg Hx   . Esophageal cancer Neg Hx   . Rectal cancer Neg Hx   . Stomach cancer Neg Hx     Social History   Tobacco Use  . Smoking status: Never Smoker  . Smokeless tobacco: Never Used  Vaping Use  . Vaping Use: Never used  Substance Use Topics  . Alcohol use: No    Alcohol/week: 0.0 standard drinks  . Drug use: No    Home Medications Prior to Admission medications   Medication Sig Start Date End Date Taking? Authorizing Provider  acetaminophen (TYLENOL) 325 MG tablet Take 325 mg by mouth every 6 (six) hours as needed.    [provider]  acetaminophen (TYLENOL) 500 MG tablet Take 500 mg by mouth at bedtime.    [provider]  azaTHIOprine (IMURAN) 50 MG tablet Take 1 tablet (50 mg total) by mouth daily. 08/10/20   Pyrtle, Lajuan Lines, MD  Cyanocobalamin (VITAMIN B-12 IJ) Inject 1 mL as directed every 30 (thirty) days.    [provider]  famotidine (PEPCID) 20 MG tablet Take 20 mg by mouth 2 (two) times daily.    [provider]  ferrous sulfate 325 (65 FE) MG tablet Take 325 mg by mouth daily with breakfast.    [provider]  furosemide (LASIX) 20 MG tablet Take 1 tablet (20 mg total) by mouth 2 (two) times daily. 01/26/20   Patwardhan, Reynold Bowen, MD  guaifenesin (ROBITUSSIN) 100 MG/5ML syrup Take 200 mg by mouth every 6 (six) hours as needed for cough (Do not exceed 4 doses in 24 hours. If cough has not improved in 24 hours notify physician.).    [provider]  ipratropium (ATROVENT) 0.03 % nasal spray Place 1 spray into both nostrils 2 (two) times daily.  10/22/16   [provider]  levETIRAcetam (KEPPRA) 500 MG tablet Take 500 mg by mouth 2 (two) times daily.    [provider]  lisinopril (ZESTRIL) 5 MG tablet Take 1 tablet by mouth daily. 07/03/19   [provider]  loratadine (CLARITIN) 10 MG tablet Take 10 mg by mouth daily.    [provider]  mesalamine (PENTASA) 500 MG CR capsule Take 1,000 mg by mouth 4 (four) times daily. Opened in applesauce     [provider]  Multiple Vitamins-Iron (MULTIVITAMINS WITH IRON) TABS tablet Take 1 tablet by mouth daily.    [provider]  nystatin ointment (MYCOSTATIN) Apply 1 application topically 3 (three) times daily. 09/27/20   Pyrtle, Lajuan Lines, MD  primidone (MYSOLINE) 250 MG tablet Take 1 tab in AM, 2 tabs in  PM 11/26/18   Cameron Sprang, MD  STELARA 90 MG/ML SOSY injection Inject 90 mg into the skin every 8 (eight) weeks. 10/04/20   [provider]  tamsulosin (FLOMAX) 0.4 MG CAPS capsule Take 1 capsule (0.4 mg total) by mouth daily. 04/12/18   Pattricia Boss, MD  triamcinolone cream (KENALOG) 0.1 % Apply 1 application topically as directed. 05/02/19   [provider]  Vitamin D, Ergocalciferol, (DRISDOL) 1.25 MG (50000 UT) CAPS capsule Take 50,000 Units by mouth every 30 (thirty) days.     [provider]  zinc oxide 20 % ointment Apply 1 application topically as needed for irritation.    [provider]    Allergies    Darvon [propoxyphene] and Other  Review of Systems   Review of Systems  Unable to perform ROS: Other    Physical Exam Updated Vital Signs BP (!) 70/42   Pulse (!) 102   Temp 97.8 F (36.6 C) (Oral)   Resp (!) 22   SpO2 100%   Physical Exam Vitals and nursing note reviewed.  Constitutional:      General: He is not in acute distress.    Appearance: Normal appearance. He is well-developed and well-nourished. He is not toxic-appearing.  HENT:     Head: Normocephalic and atraumatic.  Eyes:     General: Lids are normal.     Extraocular Movements: EOM normal.     Conjunctiva/sclera: Conjunctivae normal.      Pupils: Pupils are equal, round, and reactive to light.  Neck:     Thyroid: No thyroid mass.     Trachea: No tracheal deviation.  Cardiovascular:     Rate and Rhythm: Normal rate and regular rhythm.     Heart sounds: Normal heart sounds. No murmur heard. No gallop.   Pulmonary:     Effort: Pulmonary effort is normal. No respiratory distress.     Breath sounds: Normal breath sounds. No stridor. No decreased breath sounds, wheezing, rhonchi or rales.  Abdominal:     General: Bowel sounds are normal. There is no distension.     Palpations: Abdomen is soft.     Tenderness: There is no abdominal tenderness. There is no CVA tenderness or rebound.  Musculoskeletal:        General: No tenderness or edema. Normal range of motion.     Cervical back: Normal range of motion and neck supple.  Skin:    General: Skin is warm and dry.     Findings: No abrasion or rash.  Neurological:     General: No focal deficit present.     Mental Status: He is alert and oriented to person, place, and time.     GCS: GCS eye subscore is 4. GCS verbal subscore is 5. GCS motor subscore is 6.     Cranial Nerves: No cranial nerve deficit.     Sensory: No sensory deficit.     Deep Tendon Reflexes: Strength normal.     Comments: Moves all 4 extremities at this time.  Psychiatric:        Attention and Perception: Attention normal.        Mood and Affect: Mood and affect normal. Affect is flat.     ED Results / Procedures / Treatments   Labs (all labs ordered are listed, but only abnormal results are displayed) Labs Reviewed  RESP PANEL BY RT-PCR (FLU A&B, COVID) ARPGX2  CULTURE, BLOOD (ROUTINE X 2)  CULTURE, BLOOD (ROUTINE X 2)  URINE  CULTURE  LACTIC ACID, PLASMA  LACTIC ACID, PLASMA  COMPREHENSIVE METABOLIC PANEL  CBC WITH DIFFERENTIAL/PLATELET  PROTIME-INR  APTT  URINALYSIS, ROUTINE W REFLEX MICROSCOPIC    EKG EKG Interpretation  Date/Time:  Monday December 27 2020 11:40:04 EST Ventricular Rate:   102 PR Interval:    QRS Duration: 88 QT Interval:  345 QTC Calculation: 450 R Axis:   48 Text Interpretation: Sinus tachycardia Borderline T abnormalities, anterior leads Minimal ST elevation, inferior leads Confirmed by Lacretia Leigh ([id number]) on 12/27/2020 11:52:35 AM   Radiology No results found.  Procedures Procedures   Medications Ordered in ED Medications  lactated ringers infusion (has no administration in time range)  lactated ringers bolus 1,000 mL (has no administration in time range)    And  lactated ringers bolus 1,000 mL (has no administration in time range)    And  lactated ringers bolus 1,000 mL (has no administration in time range)    And  lactated ringers bolus 500 mL (has no administration in time range)  ceFEPIme (MAXIPIME) 2 g in sodium chloride 0.9 % 100 mL IVPB (has no administration in time range)  metroNIDAZOLE (FLAGYL) IVPB 500 mg (has no administration in time range)  vancomycin (VANCOREADY) IVPB 1000 mg/200 mL (has no administration in time range)    ED Course  I have reviewed the triage vital signs and the nursing notes.  Pertinent labs & imaging results that were available during my care of the patient were reviewed by me and considered in my medical decision making (see chart for details).    MDM Rules/Calculators/A&P                         Patient is given hypotension here and started on IV fluids. Patient has evidence of acute kidney injury here.  Concern for possible sepsis and patient given IV fluid bolus along with IV antibiotics.  Blood pressure has improved after multiple assessments.  Will admit to the hospital  CRITICAL CARE Performed by: Leota Jacobsen Total critical care time: 60 minutes Critical care time was exclusive of separately billable procedures and treating other patients. Critical care was necessary to treat or prevent imminent or life-threatening deterioration. Critical care was time spent personally by me on the  following activities: development of treatment plan with patient and/or surrogate as well as nursing, discussions with consultants, evaluation of patient's response to treatment, examination of patient, obtaining history from patient or surrogate, ordering and performing treatments and interventions, ordering and review of laboratory studies, ordering and review of radiographic studies, pulse oximetry and re-evaluation of patient's condition.  Final Clinical Impression(s) / ED Diagnoses Final diagnoses:  None    Rx / DC Orders ED Discharge Orders    None       Lacretia Leigh, MD 12/27/20 1348

## 2020-12-27 NOTE — Progress Notes (Signed)
Notified bedside nurse of need to draw repeat lactic acid. 

## 2020-12-27 NOTE — H&P (Addendum)
History and Physical    [name] KKO:[id number] DOB: December 04, 1955 DOA: 12/27/2020  PCP: Leonard Downing, MD (Confirm with patient/family/NH records and if not entered, this has to be entered at [facility] point of entry) Patient coming from: SNF  I have personally briefly reviewed patient's old medical records in [place]  Chief Complaint: Feeling weak  HPI: [name] is a 65 y.o. male with medical history significant of Crohn's disease on Imuran, HTN, mental retardation, seizure disorder, BPH, aortic stenosis and regurgitation, presented with worsening of diarrhea, new onset of nausea with vomiting and weakness.  Patient's Crohn's disease not well controlled and his GI doctor recently started patient on Imuran about 3 weeks ago.  Patient had 2 episodes of flareup after Imuran started, first episode resolved by its own.  1 week ago he started to have loose diarrhea, frequent nausea and vomiting of stomach content, initially he attributed to mustard hot dog he ate one week ago, but the frequent diarrhea lasted about 5 days and subsided by its own, and no diarrhea for last 2-3 days. Denied any abdominal pain, no fever chills.  His appetite significantly affected, and he has not been eating drinking much for last 2 days and minimal urine output.  He also noticed his urine output has significantly reduced. ED Course: Hypotensive, systolic 75Y, improved to 110s after 3-1/2 L of IV boluses.  Blood work showed AKI with creatinine 6.3 compared to less than 1 baseline, bicarb 11.  Anion gap 20.  Review of Systems: As per HPI otherwise 14 point review of systems negative.    Past Medical History:  Diagnosis Date  . Abscess of anal and rectal regions   . Allergic rhinitis   . Aortic valve insufficiency    non-rheumatic   . BPH (benign prostatic hyperplasia)   . CHF (congestive heart failure) (HCC)     chronic diastolic heart failure   . Cholelithiasis   . Crohn's disease ([place])    . Dizziness and giddiness   . DVT (deep venous thrombosis) ([place])   . Dysphagia   . Epilepsy ([place])    "petit mal; only at home" (6/9//2015)  . Heart murmur   . Hepatomegaly   . History of blood transfusion ~ 1992   "related to my Crohn's"  . History of immunosuppression therapy   . History of stomach ulcers   . Hyperplastic colon polyp   . Hypokalemia    hx of   . Iron deficiency anemia, unspecified    iron deficiency anemia   . Malaise   . Mental disability   . Muscle weakness (generalized)   . Other B-complex deficiencies   . REGIONAL ENTERITIS, LARGE INTESTINE 10/05/2000   Qualifier: Diagnosis of  By: Jerral Ralph    . S/P partial colectomy for Crohn's disease 12/01/2015  . SBO (small bowel obstruction) ([place]) 12/01/2015  . Sepsis ([place])    hx of   . Severe protein-calorie malnutrition Altamease Oiler: less than 60% of standard weight) ([place])   . Syncope 03/31/2014  . Syncope and collapse 03/31/2014   "while driving"  . Tremor   . Unsteadiness on feet   . Urticaria   . Vitamin D deficiency     Past Surgical History:  Procedure Laterality Date  . CHOLECYSTECTOMY N/A 06/06/2018   Procedure: LAPAROSCOPIC CHOLECYSTECTOMY WITH INTRAOPERATIVE CHOLANGIOGRAM ERAS PATHWAY;  Surgeon: Alphonsa Overall, MD;  Location: [facility];  Service: General;  Laterality: N/A;  . COLON SURGERY     Anastomosis  45cm from anal verge c/w deswecending colon anastomosis  . COLONOSCOPY  2013, 2016  . COLOSTOMY TAKEDOWN N/A 08/01/2016   Procedure: LAPAROSCOPIC COLOSTOMY REVERSAL;  Surgeon: Alphonsa Overall, MD;  Location: [facility];  Service: General;  Laterality: N/A;  . [place]   ileocectomy   . HERNIA REPAIR    . LAPAROSCOPY N/A 12/04/2015   Procedure: LAPAROSCOPY DIAGNOSTIC, LAPAROSCOPIC INTEROLYSIS OF ADHESIONS, HAND- ASSISTED SMALL BOWEL RESECTION, MOBILIZATION OF SPLENIC FLEXURE, LEFT COLECTOMY WITH CREATION OF COLOSTOMY (HARTMAN'S PROCEDURE);  Surgeon: Alphonsa Overall, MD;  Location: [facility];  Service:  General;  Laterality: N/A;  . PILONIDAL CYST EXCISION  1980's  . TONSILLECTOMY  ~ 1967  . UMBILICAL HERNIA REPAIR  ~ 1983     reports that he has never smoked. He has never used smokeless tobacco. He reports that he does not drink alcohol and does not use drugs.  Allergies  Allergen Reactions  . Darvon [Propoxyphene] Other (See Comments)    Unknown reaction  . Other Other (See Comments)    Ask    Family History  Problem Relation Age of Onset  . Skin cancer Father   . Colon cancer Mother   . Dementia Mother   . Seizures Neg Hx   . Crohn's disease Neg Hx   . Esophageal cancer Neg Hx   . Rectal cancer Neg Hx   . Stomach cancer Neg Hx      Prior to Admission medications   Medication Sig Start Date End Date Taking? Authorizing Provider  acetaminophen (TYLENOL) 325 MG tablet Take 325 mg by mouth every 6 (six) hours as needed.   Yes [provider]  acetaminophen (TYLENOL) 500 MG tablet Take 500 mg by mouth at bedtime.   Yes [provider]  azaTHIOprine (IMURAN) 50 MG tablet Take 1 tablet (50 mg total) by mouth daily. 08/10/20  Yes Pyrtle, Lajuan Lines, MD  cetaphil (CETAPHIL) lotion Apply 1 application topically at bedtime. Apply to arms,legs,torso topically at bedtime for dry skin   Yes [provider]  famotidine (PEPCID) 20 MG tablet Take 20 mg by mouth 2 (two) times daily.   Yes [provider]  ferrous sulfate 325 (65 FE) MG tablet Take 325 mg by mouth daily with breakfast.   Yes [provider]  furosemide (LASIX) 20 MG tablet Take 1 tablet (20 mg total) by mouth 2 (two) times daily. 01/26/20  Yes Patwardhan, Manish J, MD  guaifenesin (ROBITUSSIN) 100 MG/5ML syrup Take 200 mg by mouth every 6 (six) hours as needed for cough (Do not exceed 4 doses in 24 hours. If cough has not improved in 24 hours notify physician.).   Yes [provider]  ipratropium (ATROVENT) 0.03 % nasal spray Place 1 spray into both nostrils 2 (two) times daily.   10/22/16  Yes [provider]  levETIRAcetam (KEPPRA) 500 MG tablet Take 500 mg by mouth 2 (two) times daily.   Yes [provider]  lisinopril (ZESTRIL) 5 MG tablet Take 5 mg by mouth daily. 07/03/19  Yes [provider]  loratadine (CLARITIN) 10 MG tablet Take 10 mg by mouth daily.   Yes [provider]  mesalamine (PENTASA) 500 MG CR capsule Take 1,000 mg by mouth 4 (four) times daily. Opened in applesauce   Yes [provider]  Multiple Vitamins-Iron (MULTIVITAMINS WITH IRON) TABS tablet Take 1 tablet by mouth daily.   Yes [provider]  primidone (MYSOLINE) 250 MG tablet Take 1 tab in AM, 2  tabs in PM Patient taking differently: Take 250-500 mg by mouth See admin instructions. Taking 1 tablet (250 mg) in morning, taking 2 tablets (500 mg) at bedtime 11/26/18  Yes Cameron Sprang, MD  selenium sulfide (SELSUN) 1 % LOTN Apply 1 application topically See admin instructions. Apply to beard/eyebrows topically every evening shift every Mon, Wed,Fri for dandruff   Yes [provider]  tamsulosin (FLOMAX) 0.4 MG CAPS capsule Take 1 capsule (0.4 mg total) by mouth daily. 04/12/18  Yes Pattricia Boss, MD  triamcinolone cream (KENALOG) 0.1 % Apply 1 application topically daily. Apply to face after shaving 05/02/19  Yes [provider]  Cyanocobalamin (VITAMIN B-12 IJ) Inject 1 mL as directed every 30 (thirty) days.    [provider]  nystatin ointment (MYCOSTATIN) Apply 1 application topically 3 (three) times daily. 09/27/20   Pyrtle, Lajuan Lines, MD  STELARA 90 MG/ML SOSY injection Inject 45 mg into the skin every 8 (eight) weeks. Starting on 01-07-21 10/04/20   [provider]  Vitamin D, Ergocalciferol, (DRISDOL) 1.25 MG (50000 UT) CAPS capsule Take 50,000 Units by mouth every 30 (thirty) days. Due 01-04-21    [provider]    Physical Exam: Vitals:   12/27/20 1315 12/27/20 1345 12/27/20 1400 12/27/20 1415   BP: (!) 94/49 (!) 88/46 (!) 108/50 (!) 109/50  Pulse: 94 96 (!) 102 (!) 102  Resp: 16 16 17 17   Temp:      TempSrc:      SpO2: 99% 100% 99% 99%    Constitutional: NAD, calm, comfortable Vitals:   12/27/20 1315 12/27/20 1345 12/27/20 1400 12/27/20 1415  BP: (!) 94/49 (!) 88/46 (!) 108/50 (!) 109/50  Pulse: 94 96 (!) 102 (!) 102  Resp: 16 16 17 17   Temp:      TempSrc:      SpO2: 99% 100% 99% 99%   Eyes: PERRL, lids and conjunctivae normal ENMT: Mucous membranes are dry. Posterior pharynx clear of any exudate or lesions.Normal dentition.  Neck: normal, supple, no masses, no thyromegaly Respiratory: clear to auscultation bilaterally, no wheezing, no crackles. Normal respiratory effort. No accessory muscle use.  Cardiovascular: Regular rate and rhythm, no murmurs / rubs / gallops. No extremity edema. 2+ pedal pulses. No carotid bruits.  Abdomen: no tenderness, no masses palpated. No hepatosplenomegaly. Bowel sounds positive.  Musculoskeletal: no clubbing / cyanosis. No joint deformity upper and lower extremities. Good ROM, no contractures. Normal muscle tone.  Skin: no rashes, lesions, ulcers. No induration Neurologic: CN 2-12 grossly intact. Sensation intact, DTR normal. Strength 5/5 in all 4.  Psychiatric: Normal judgment and insight. Alert and oriented x 3. Normal mood.     Labs on Admission: I have personally reviewed following labs and imaging studies  CBC: No results for input(s): WBC, NEUTROABS, HGB, HCT, MCV, PLT in the last 168 hours. Basic Metabolic Panel: Recent Labs  Lab 12/27/20 1151  NA 138  K 4.8  CL 107  CO2 11*  GLUCOSE 98  BUN 75*  CREATININE 6.36*  CALCIUM 7.3*   GFR: CrCl cannot be calculated (Unknown ideal weight.). Liver Function Tests: Recent Labs  Lab 12/27/20 1151  AST 17  ALT 12  ALKPHOS 65  BILITOT 0.5  PROT 6.6  ALBUMIN 2.5*   No results for input(s): LIPASE, AMYLASE in the last 168 hours. No results for input(s): AMMONIA in the  last 168 hours. Coagulation Profile: Recent Labs  Lab 12/27/20 1245  INR 1.3*   Cardiac Enzymes: No results  for input(s): CKTOTAL, CKMB, CKMBINDEX, TROPONINI in the last 168 hours. BNP (last 3 results) No results for input(s): PROBNP in the last 8760 hours. HbA1C: No results for input(s): HGBA1C in the last 72 hours. CBG: Recent Labs  Lab 12/27/20 1152  GLUCAP 116*   Lipid Profile: No results for input(s): CHOL, HDL, LDLCALC, TRIG, CHOLHDL, LDLDIRECT in the last 72 hours. Thyroid Function Tests: No results for input(s): TSH, T4TOTAL, FREET4, T3FREE, THYROIDAB in the last 72 hours. Anemia Panel: No results for input(s): VITAMINB12, FOLATE, FERRITIN, TIBC, IRON, RETICCTPCT in the last 72 hours. Urine analysis:    Component Value Date/Time   COLORURINE YELLOW 04/23/2018 1727   APPEARANCEUR HAZY (A) 04/23/2018 1727   LABSPEC 1.019 04/23/2018 1727   PHURINE 5.0 04/23/2018 1727   GLUCOSEU NEGATIVE 04/23/2018 1727   HGBUR SMALL (A) 04/23/2018 1727   BILIRUBINUR NEGATIVE 04/23/2018 1727   KETONESUR NEGATIVE 04/23/2018 1727   PROTEINUR 100 (A) 04/23/2018 1727   UROBILINOGEN 1.0 04/01/2014 1057   NITRITE NEGATIVE 04/23/2018 1727   LEUKOCYTESUR MODERATE (A) 04/23/2018 1727    Radiological Exams on Admission: DG Chest Port 1 View  Result Date: 12/27/2020 CLINICAL DATA:  Questionable sepsis EXAM: PORTABLE CHEST 1 VIEW COMPARISON:  July 2019 FINDINGS: Low lung volumes. No consolidation or edema. No significant pleural effusion. No pneumothorax. Similar cardiomediastinal contours. IMPRESSION: No acute process in the chest. Electronically Signed   By: Macy Mis M.D.   On: 12/27/2020 12:49    EKG: Independently reviewed.  Sinus tachycardia  Assessment/Plan Active Problems:   AKI (acute kidney injury) ([place])  (please populate well all problems here in Problem List. (For example, if patient is on BP meds at home and you resume or decide to hold them, it is a problem that needs to  be her. Same for CAD, COPD, HLD and so on)  AKI -Severe hypokalemia secondary to GI loss from frequent diarrhea and nauseous vomit. -Food toxin vs Crohn's disease flaring up.  Send ESR and CRP. -No diarrhea/bowel movement for last 2 days. -Received 3.5 L of IV boluses in ED, switch IV fluids to bicarb drip. -Hopefully not ATN, no indication for HD now. -I/O -Repeat BMP and consider Nephro consult if no kidney function improvement.  Hypotension and SIRS -From severe hypovolemia/dehydration, no clear source of infection as of now, since earlier diarrhea has resolved. -Received vancomycin and cefepime in ED.  Discontinue further ABX.  Combined non-anion gap and high anion gap metabolic acidosis -From AKI, and GI loss -Hydration and re-evaluate.  HTN -Hold Lasix and BP meds  Mental retardation -At baseline  DVT prophylaxis: Heparin subQ Code Status: DNR Family Communication: Patient's sister's phone not taking message. Disposition Plan: Expect more than 2 midnight hospital stay to treat AKI Consults called: None Admission status: PCU   Lequita Halt MD [facility] Pager [phone]  12/27/2020, 2:49 PM

## 2020-12-27 NOTE — ED Triage Notes (Signed)
Pt to ED via EMS from [facility] home. Nausea /Vomitting that started on Friday, diarrhea /vomitting over the weekend. 950ML NS bolus given by EMS. # 20 Left AC. HX: HTN, Dysphagia, intellectual disability. DNR form accompanies pt, Orientation at baseline, pt weak. Pt tested for COVID this morning- pt was negative: Last VS: 76/42, hr 104, 96.4 temp. 115 cbg. RR 30. 93% RA/

## 2020-12-28 ENCOUNTER — Other Ambulatory Visit: Payer: Self-pay

## 2020-12-28 ENCOUNTER — Encounter (HOSPITAL_COMMUNITY): Payer: Self-pay | Admitting: Internal Medicine

## 2020-12-28 DIAGNOSIS — K529 Noninfective gastroenteritis and colitis, unspecified: Secondary | ICD-10-CM

## 2020-12-28 DIAGNOSIS — L8990 Pressure ulcer of unspecified site, unspecified stage: Secondary | ICD-10-CM | POA: Insufficient documentation

## 2020-12-28 LAB — BASIC METABOLIC PANEL
Anion gap: 16 — ABNORMAL HIGH (ref 5–15)
BUN: 79 mg/dL — ABNORMAL HIGH (ref 8–23)
CO2: 21 mmol/L — ABNORMAL LOW (ref 22–32)
Calcium: 8.1 mg/dL — ABNORMAL LOW (ref 8.9–10.3)
Chloride: 99 mmol/L (ref 98–111)
Creatinine, Ser: 5.5 mg/dL — ABNORMAL HIGH (ref 0.61–1.24)
GFR, Estimated: 11 mL/min — ABNORMAL LOW (ref >60)
Glucose, Bld: 111 mg/dL — ABNORMAL HIGH (ref 70–99)
Potassium: 4 mmol/L (ref 3.5–5.1)
Sodium: 136 mmol/L (ref 135–145)

## 2020-12-28 LAB — GASTROINTESTINAL PANEL BY PCR, STOOL (REPLACES STOOL CULTURE)

## 2020-12-28 LAB — LACTIC ACID, PLASMA
Lactic Acid, Venous: 2 mmol/L (ref 0.5–1.9)
Lactic Acid, Venous: 2.1 mmol/L (ref 0.5–1.9)
Lactic Acid, Venous: 1.3 mmol/L (ref 0.5–1.9)

## 2020-12-28 LAB — C DIFFICILE QUICK SCREEN W PCR REFLEX
C Diff antigen: NEGATIVE
C Diff interpretation: NOT DETECTED
C Diff toxin: NEGATIVE

## 2020-12-28 LAB — BLOOD GAS, VENOUS
Acid-base deficit: 2.9 mmol/L — ABNORMAL HIGH (ref 0.0–2.0)
Bicarbonate: 22.7 mmol/L (ref 20.0–28.0)
O2 Saturation: 44.5 %
Patient temperature: 37
pCO2, Ven: 48.1 mmHg (ref 44.0–60.0)
pH, Ven: 7.294 (ref 7.250–7.430)

## 2020-12-28 LAB — ALBUMIN: Albumin: 2.3 g/dL — ABNORMAL LOW (ref 3.5–5.0)

## 2020-12-28 LAB — C-REACTIVE PROTEIN: CRP: 31.1 mg/dL — ABNORMAL HIGH (ref <1.0)

## 2020-12-28 LAB — MRSA PCR SCREENING: MRSA by PCR: POSITIVE — AB

## 2020-12-28 LAB — CK: Total CK: 401 U/L — ABNORMAL HIGH (ref 49–397)

## 2020-12-28 MED ORDER — LACTATED RINGERS IV SOLN: INTRAVENOUS | Status: DC

## 2020-12-28 MED ORDER — MIDODRINE HCL 5 MG PO TABS
5.0000 mg | ORAL_TABLET | Freq: Three times a day (TID) | ORAL | Status: DC
  Administered 2020-12-28 – 2020-12-31 (×9): 5 mg via ORAL
  Filled 2020-12-28 (×9): qty 1

## 2020-12-28 MED ORDER — LOPERAMIDE HCL 2 MG PO CAPS
2.0000 mg | ORAL_CAPSULE | Freq: Two times a day (BID) | ORAL | Status: AC
  Administered 2020-12-28 – 2020-12-31 (×6): 2 mg via ORAL
  Filled 2020-12-28 (×6): qty 1

## 2020-12-28 NOTE — Progress Notes (Signed)
PROGRESS NOTE    [name]  QZR:[id number] DOB: 07/14/1956 DOA: 12/27/2020 PCP: Leonard Downing, MD   Brief Narrative:  HPI: [name] is a 65 y.o. male with medical history significant of Crohn's disease on Imuran, HTN, mental retardation, seizure disorder, BPH, aortic stenosis and regurgitation, presented with worsening of diarrhea, new onset of nausea with vomiting and weakness.  Patient's Crohn's disease not well controlled and his GI doctor recently started patient on Imuran about 3 weeks ago.  Patient had 2 episodes of flareup after Imuran started, first episode resolved by its own.  1 week ago he started to have loose diarrhea, frequent nausea and vomiting of stomach content, initially he attributed to mustard hot dog he ate one week ago, but the frequent diarrhea lasted about 5 days and subsided by its own, and no diarrhea for last 2-3 days. Denied any abdominal pain, no fever chills.  His appetite significantly affected, and he has not been eating drinking much for last 2 days and minimal urine output.  He also noticed his urine output has significantly reduced. ED Course: Hypotensive, systolic 35K, improved to 110s after 3-1/2 L of IV boluses.  Blood work showed AKI with creatinine 6.3 compared to less than 1 baseline, bicarb 11.  Anion gap 20.  Assessment & Plan:   Active Problems:   AKI (acute kidney injury) ([place])   Pressure injury of skin   AKI/dehydration: His AKI is likely secondary to dehydration stemming from viral gastroenteritis.  His creatinine has improved from 6.3-5.5 but he does not have good urine output.  Will check bladder scan and if needed, insert Foley catheter for accurate I's and O's.  Ultrasound kidney shows medical renal disease in the right kidney.  His creatinine previously was within normal range.  Avoid nephrotoxic agents.  Hold home dose of Lasix.  Due to alarming rise in creatinine and the fact that he has crackles which prohibits me from  providing him aggressive hydration, I have consulted nephrology and discussed with Dr. Joylene Grapes.  Metabolic acidosis: Presented with CO2 of 11, started on bicarb drip.  Currently 21.  Continue bicarb drip.  Defer further management to nephrology.  Severe sepsis likely due to viral gastroenteritis: Patient met criteria for severe sepsis based on tachycardia, tachypnea presumed viral gastroenteritis and lactic acid of 3.8.  Repeat lactic acid this morning was 2.0.  Will repeat again.  No leukocytosis.  He is afebrile.  Has had 3 more bowel movements which were loose.  C. difficile tested negative.  GI pathogen panel pending.  GI on board.  GI recommends against antibiotics and does not feel that patient has any Crohn's exacerbation.  Hypotension: Unsure what his baseline blood pressure is.  It appears that his blood pressure has been running low since yesterday but patient remains asymptomatic.  Unable to provide more IV fluids due to crackles on the lung exam.  Hold home dose of lisinopril and Lasix.  Mental retardation -At baseline  DVT prophylaxis: heparin injection 5,000 Units Start: 12/27/20 1445   Code Status: DNR  Family Communication:  None present at bedside. Discussed with patient sister [name] over the phone.  She is requesting to call tomorrow again with update due to patient having mental retardation.  Status is: Inpatient  Remains inpatient appropriate because:Inpatient level of care appropriate due to severity of illness   Dispo: The patient is from: SNF              Anticipated d/c is  to: SNF              Patient currently is not medically stable to d/c.   Difficult to place patient No        Estimated body mass index is 32.59 kg/m as calculated from the following:   Height as of 11/30/20: 5' 9"  (1.753 m).   Weight as of this encounter: 100.1 kg.  Pressure Injury 06/06/18 Covered with foam dressing (Active)  06/06/18 1315  Location: Heel  Location Orientation: Right   Staging:   Wound Description (Comments): Covered with foam dressing  Present on Admission: Yes     Pressure Injury 12/27/20 Coccyx Medial Stage 1 -  Intact skin with non-blanchable redness of a localized area usually over a bony prominence. (Active)  12/27/20 2300  Location: Coccyx  Location Orientation: Medial  Staging: Stage 1 -  Intact skin with non-blanchable redness of a localized area usually over a bony prominence.  Wound Description (Comments):   Present on Admission: Yes     Nutritional status:               Consultants:   Nephrology  GI  Procedures:   None  Antimicrobials:  Anti-infectives (From admission, onward)   Start     Dose/Rate Route Frequency Ordered Stop   12/28/20 1200  ceFEPIme (MAXIPIME) 2 g in sodium chloride 0.9 % 100 mL IVPB  Status:  Discontinued        2 g 200 mL/hr over 30 Minutes Intravenous Every 24 hours 12/27/20 1543 12/27/20 1548   12/27/20 1542  vancomycin variable dose per unstable renal function (pharmacist dosing)  Status:  Discontinued         Does not apply See admin instructions 12/27/20 1543 12/27/20 1549   12/27/20 1200  ceFEPIme (MAXIPIME) 2 g in sodium chloride 0.9 % 100 mL IVPB        2 g 200 mL/hr over 30 Minutes Intravenous  Once 12/27/20 1151 12/27/20 1242   12/27/20 1200  metroNIDAZOLE (FLAGYL) IVPB 500 mg        500 mg 100 mL/hr over 60 Minutes Intravenous  Once 12/27/20 1151 12/27/20 1348   12/27/20 1200  vancomycin (VANCOREADY) IVPB 1750 mg/350 mL  Status:  Discontinued        1,750 mg 175 mL/hr over 120 Minutes Intravenous  Once 12/27/20 1151 12/27/20 1624         Subjective: Seen and examined.  He has no complaints.  He was fully alert and mostly oriented.  Objective: Vitals:   12/27/20 2151 12/28/20 0733 12/28/20 0808 12/28/20 1115  BP: (!) 104/53 (!) 73/40  (!) 96/55  Pulse: 90 74  95  Resp: 16 10  16   Temp: 98 F (36.7 C)     TempSrc: Oral     SpO2: 98% 97%  98%  Weight:   100.1 kg      Intake/Output Summary (Last 24 hours) at 12/28/2020 1512 Last data filed at 12/27/2020 1624 Gross per 24 hour  Intake 850 ml  Output --  Net 850 ml   Filed Weights   12/28/20 0808  Weight: 100.1 kg    Examination:  General exam: Appears calm and comfortable  Respiratory system: Clear to auscultation. Respiratory effort normal. Cardiovascular system: S1 & S2 heard, RRR. No JVD, murmurs, rubs, gallops or clicks. No pedal edema. Gastrointestinal system: Abdomen is nondistended, soft and nontender. No organomegaly or masses felt. Normal bowel sounds heard. Central nervous system: Alert and oriented x2. No  focal neurological deficits. Extremities: Symmetric 5 x 5 power. Skin: No rashes, lesions or ulcers Psychiatry: Judgement and insight appear poor   Data Reviewed: I have personally reviewed following labs and imaging studies  CBC: Recent Labs  Lab 12/27/20 1639 12/27/20 1650  WBC 9.4  --   NEUTROABS 7.1  --   HGB 11.5* 11.2*  HCT 35.7* 33.0*  MCV 103.8*  --   PLT 229  --    Basic Metabolic Panel: Recent Labs  Lab 12/27/20 1151 12/27/20 1650 12/28/20 0307  NA 138 138 136  K 4.8 4.5 4.0  CL 107  --  99  CO2 11*  --  21*  GLUCOSE 98  --  111*  BUN 75*  --  79*  CREATININE 6.36*  --  5.50*  CALCIUM 7.3*  --  8.1*   GFR: Estimated Creatinine Clearance: 15.4 mL/min (A) (by C-G formula based on SCr of 5.5 mg/dL (H)). Liver Function Tests: Recent Labs  Lab 12/27/20 1151  AST 17  ALT 12  ALKPHOS 65  BILITOT 0.5  PROT 6.6  ALBUMIN 2.5*   No results for input(s): LIPASE, AMYLASE in the last 168 hours. No results for input(s): AMMONIA in the last 168 hours. Coagulation Profile: Recent Labs  Lab 12/27/20 1245  INR 1.3*   Cardiac Enzymes: Recent Labs  Lab 12/28/20 0307  CKTOTAL 401*   BNP (last 3 results) No results for input(s): PROBNP in the last 8760 hours. HbA1C: No results for input(s): HGBA1C in the last 72 hours. CBG: Recent Labs  Lab  12/27/20 1152 12/27/20 2302  GLUCAP 116* 100*   Lipid Profile: No results for input(s): CHOL, HDL, LDLCALC, TRIG, CHOLHDL, LDLDIRECT in the last 72 hours. Thyroid Function Tests: No results for input(s): TSH, T4TOTAL, FREET4, T3FREE, THYROIDAB in the last 72 hours. Anemia Panel: No results for input(s): VITAMINB12, FOLATE, FERRITIN, TIBC, IRON, RETICCTPCT in the last 72 hours. Sepsis Labs: Recent Labs  Lab 12/27/20 1156 12/27/20 1351 12/27/20 1833 12/28/20 0307  LATICACIDVEN 2.2* 3.8* 1.8 2.0*    Recent Results (from the past 240 hour(s))  Blood Culture (routine x 2)     Status: None (Preliminary result)   Collection Time: 12/27/20 11:56 AM   Specimen: BLOOD LEFT HAND  Result Value Ref Range Status   Specimen Description BLOOD LEFT HAND  Final   Special Requests   Final    BOTTLES DRAWN AEROBIC ONLY Blood Culture results may not be optimal due to an inadequate volume of blood received in culture bottles   Culture   Final    NO GROWTH < 24 HOURS Performed at [facility], [place] [address], [place], [place] [address]    Report Status PENDING  Incomplete  Resp Panel by RT-PCR (Flu A&B, Covid) Nasopharyngeal Swab     Status: None   Collection Time: 12/27/20 12:32 PM   Specimen: Nasopharyngeal Swab; Nasopharyngeal(NP) swabs in vial transport medium  Result Value Ref Range Status   SARS Coronavirus 2 by RT PCR NEGATIVE NEGATIVE Final    Comment: (NOTE) SARS-CoV-2 target nucleic acids are NOT DETECTED.  The SARS-CoV-2 RNA is generally detectable in upper respiratory specimens during the acute phase of infection. The lowest concentration of SARS-CoV-2 viral copies this assay can detect is 138 copies/mL. A negative result does not preclude SARS-Cov-2 infection and should not be used as the sole basis for treatment or other patient management decisions. A negative result may occur with  improper specimen collection/handling, submission of specimen  other than nasopharyngeal  swab, presence of viral mutation(s) within the areas targeted by this assay, and inadequate number of viral copies(<138 copies/mL). A negative result must be combined with clinical observations, patient history, and epidemiological information. The expected result is Negative.  Fact Sheet for Patients:  EntrepreneurPulse.com.au  Fact Sheet for Healthcare Providers:  IncredibleEmployment.be  This test is no t yet approved or cleared by the [place] FDA and  has been authorized for detection and/or diagnosis of SARS-CoV-2 by FDA under an Emergency Use Authorization (EUA). This EUA will remain  in effect (meaning this test can be used) for the duration of the COVID-19 declaration under Section 564(b)(1) of the Act, 21 U.S.C.section 360bbb-3(b)(1), unless the authorization is terminated  or revoked sooner.       Influenza A by PCR NEGATIVE NEGATIVE Final   Influenza B by PCR NEGATIVE NEGATIVE Final    Comment: (NOTE) The Xpert Xpress SARS-CoV-2/FLU/RSV plus assay is intended as an aid in the diagnosis of influenza from Nasopharyngeal swab specimens and should not be used as a sole basis for treatment. Nasal washings and aspirates are unacceptable for Xpert Xpress SARS-CoV-2/FLU/RSV testing.  Fact Sheet for Patients: EntrepreneurPulse.com.au  Fact Sheet for Healthcare Providers: IncredibleEmployment.be  This test is not yet approved or cleared by the [place] FDA and has been authorized for detection and/or diagnosis of SARS-CoV-2 by FDA under an Emergency Use Authorization (EUA). This EUA will remain in effect (meaning this test can be used) for the duration of the COVID-19 declaration under Section 564(b)(1) of the Act, 21 U.S.C. section 360bbb-3(b)(1), unless the authorization is terminated or revoked.  Performed at [facility], [place] [address] [place], [place] [address]   Blood  Culture (routine x 2)     Status: None (Preliminary result)   Collection Time: 12/27/20 12:39 PM   Specimen: BLOOD RIGHT FOREARM  Result Value Ref Range Status   Specimen Description BLOOD RIGHT FOREARM  Final   Special Requests   Final    BOTTLES DRAWN AEROBIC ONLY Blood Culture results may not be optimal due to an inadequate volume of blood received in culture bottles   Culture   Final    NO GROWTH 1 DAY Performed at [facility], [place] [address], [place], [place] [address]    Report Status PENDING  Incomplete  C Difficile Quick Screen w PCR reflex     Status: None   Collection Time: 12/28/20  6:15 AM   Specimen: STOOL  Result Value Ref Range Status   C Diff antigen NEGATIVE NEGATIVE Final   C Diff toxin NEGATIVE NEGATIVE Final   C Diff interpretation No C. difficile detected.  Final    Comment: Performed at [facility], [place] [address], [place], [place] [address]      Radiology Studies: US RENAL  Result Date: 12/27/2020 CLINICAL DATA:  Acute renal injury EXAM: RENAL / URINARY TRACT ULTRASOUND COMPLETE COMPARISON:  04/23/2018 FINDINGS: Right Kidney: Renal measurements: 9.2 x 4.6 x 4.0 cm. = volume: 87 mL. Diffuse increased echogenicity is noted. No mass lesion or hydronephrosis is seen. Left Kidney: Renal measurements: 10.8 x 5.4 x 5.7 cm. = volume: 173 mL. No mass lesion or hydronephrosis is noted. Bladder: Decompressed Other: Fluid-filled bowel is noted just superior to the bladder. IMPRESSION: No mass lesion or hydronephrosis is noted. Increased echogenicity in the right kidney is noted consistent with medical renal disease. The echo pattern of the left kidney is incompletely evaluated. Electronically  Signed   By: Inez Catalina M.D.   On: 12/27/2020 19:52   DG Chest Port 1 View  Result Date: 12/27/2020 CLINICAL DATA:  Questionable sepsis EXAM: PORTABLE CHEST 1 VIEW COMPARISON:  July 2019 FINDINGS: Low lung volumes. No consolidation or edema. No significant pleural effusion.  No pneumothorax. Similar cardiomediastinal contours. IMPRESSION: No acute process in the chest. Electronically Signed   By: Macy Mis M.D.   On: 12/27/2020 12:49    Scheduled Meds: . famotidine  20 mg Oral Daily  . ferrous sulfate  325 mg Oral Q breakfast  . heparin  5,000 Units Subcutaneous Q8H  . ipratropium  1 spray Each Nare BID  . lactobacillus acidophilus  2 tablet Oral TID  . loperamide  2 mg Oral BID  . loratadine  10 mg Oral Daily  . mesalamine  1,000 mg Oral QID  . primidone  250 mg Oral Daily  . primidone  500 mg Oral QHS  . tamsulosin  0.4 mg Oral Daily  . triamcinolone  1 application Topical Daily   Continuous Infusions:   LOS: 1 day   Time spent: 37 minutes   Darliss Cheney, MD [facility]  12/28/2020, 3:12 PM   To contact the attending provider between 7A-7P or the covering provider during after hours 7P-7A, please log into the web site [email or web address].

## 2020-12-28 NOTE — Progress Notes (Addendum)
Daily Rounding Note  12/28/2020, 9:07 AM  LOS: 1 day   SUBJECTIVE:   Chief complaint: Diarrhea, nausea, non-bloody emesis.    Nausea has not recurred in > 24 hours.  Tolerating solid food. Liquid brown stools persist. 1 episode of urinary incontinence but no urine output from condom catheter recorded since arrival.  Pt feels no bladder pressure BPS soft/low to 80s-90s/40s-60s.  HR mid 90s to low 100s.    OBJECTIVE:         Vital signs in last 24 hours:    Temp:  [97.8 F (36.6 C)-99.6 F (37.6 C)] 98 F (36.7 C) (03/07 2151) Pulse Rate:  [90-104] 90 (03/07 2151) Resp:  [16-22] 16 (03/07 2151) BP: (70-109)/(29-55) 104/53 (03/07 2151) SpO2:  [96 %-100 %] 98 % (03/07 2151) FiO2 (%):  [21 %] 21 % (03/07 1419) Weight:  [100.1 kg] 100.1 kg (03/08 0808)   Filed Weights   12/28/20 0808  Weight: 100.1 kg   General: pleasant, comfortable, not looking acutely ill   Heart: RRR Chest: clear bil.   Abdomen: soft, NT, ND.  Normal, active BS  Extremities: no CCE Neuro/Psych:  Oriented x 3.   No gross  Deficits, weakness or tremors.     Lab Results: Recent Labs    12/27/20 1639 12/27/20 1650  WBC 9.4  --   HGB 11.5* 11.2*  HCT 35.7* 33.0*  PLT 229  --    BMET Recent Labs    12/27/20 1151 12/27/20 1650 12/28/20 0307  NA 138 138 136  K 4.8 4.5 4.0  CL 107  --  99  CO2 11*  --  21*  GLUCOSE 98  --  111*  BUN 75*  --  79*  CREATININE 6.36*  --  5.50*  CALCIUM 7.3*  --  8.1*   LFT Recent Labs    12/27/20 1151  PROT 6.6  ALBUMIN 2.5*  AST 17  ALT 12  ALKPHOS 65  BILITOT 0.5   PT/INR Recent Labs    12/27/20 1245  LABPROT 15.8*  INR 1.3*    ASSESMENT:   *   Acute onset N/V/D.  Suspect acute GI viral illness.   Longstanding hx SB and colonic Crohn's dz w previous surgeries.  Crohn's stable on Stelara, Pentasa and Imuran.    ESR elevated at 49, non-specific and does not necessarily indicate Crohn's  flare. WBCs normal.    *   Sepsis with elevated lactic acid and CRP.  *   AKI in setting of dehydration.  Improved.  Persistent oliguria.  Renal ultrasound shows medical renal disease in R kidney.    *    macrocytosis.  Minor anemia w Hgb 11.2.  Outpatient meds include oral iron, monthly B12 injection.  PLAN   *   Agree w checking for C diff and stool PCR panel given persistence of diarrhea, collect stool spec ASAP.   *    Obtain B12 and folate levels in AM.  .  *    Lamont Snowball (sister and poa) [phone].  In case consent required.     Azucena Freed  12/28/2020, 9:07 AM Phone [phone]   ________________________________________________________________________  Velora Heckler GI MD note:  I personally examined the patient, reviewed the data and agree with the assessment and plan described above.  Still seems much more likely to be an infectious gastroenteritis than a Crohn's flare. His nausea is completely gone, he is tolerating solid foods.  Diarrhea persists be overall much improved.  C. Diff negative, the path panel is otherwise pending.  I am going to start imodium BID on scheduled basis for now.   Owens Loffler, MD [facility] Pager [phone]

## 2020-12-28 NOTE — Consult Note (Signed)
Nephrology Consult   Requesting provider: Darliss Cheney Service requesting consult: Hospitalist Reason for consult: AKI   Assessment/Recommendations: [name] is a/an 65 y.o. male with a past medical history  Crohn's, hypertension, seizures, aortic insufficiency  who present w/ Crohn's flare? C/b AKI  Nonoliguric severe AKI secondary to dehydration: Likely dehydration with hypotension contributing.  Urinalysis fairly bland.  Possible ATN but urine output is improved today.  Appears relatively euvolemic or slightly dehydrated today.  Previously had crackles but none audible at this time.  Chest x-ray clear -Lactated Ringer's 100 cc/h, can stop if hypoxia develops -Home lisinopril -Midodrine for hypotension as below -Obtain urine sodium -Continue to monitor daily Cr, Dose meds for GFR -Monitor Daily I/Os, Daily weight  -Maintain MAP>65 for optimal renal perfusion.  -Avoid nephrotoxic medications including NSAIDs and Vanc/Zosyn combo -Renal ultrasound without obstruction -Currently no indication for HD  Crohn's disease: With nausea, vomiting, diarrhea.  Not definitively felt to be a flareup per GI.  Continue to follow with her management.  C. difficile negative  Metabolic acidosis: Likely associated with AKI.  Improved with bicarb supplementation  Hypotension: History of hypertension now with significantly low blood pressures.  Hydration as above.  Start midodrine 5 mg 3 times daily.  Obtain echocardiogram  Severe sepsis: Thought to be secondary to viral gastroenteritis.  Follow-up work-up.   Recommendations conveyed to primary service.    [facility] 12/28/2020 3:43 PM   _____________________________________________________________________________________ CC: AKI  History of Present Illness: [name] is a/an 65 y.o. male with a past medical history of Crohn's, hypertension, seizures, aortic insufficiency who presents with nausea and  diarrhea  The patient presented yesterday with worsening diarrhea, nausea, vomiting, weakness.  Patient has Crohn's disease that is not very well controlled.  History of subtotal colectomy.  Patient has had recent flares.  1 week ago the patient had worsening diarrhea, nausea, vomiting.  He did not have any fevers or chills.  Did not notice that he was urinating less (although he stated to previous providers that his urine output had decreased) at home but sometimes it is hard to empty his bladder.  His appetite had also decreased.  Did not notice any blood in his urine.  No NSAID use  In the emergency department he was noted to be hypotensive.  He was given several liters of IV fluids.  Lab work demonstrated a creatinine of 6.3 compared to baseline of 1.  Bicarb is also 11.  Patient was admitted for ongoing management.  Today creatinine improved to 5.5.  Urine output is also improved.  Patient states that he feels much better at this time.  Denies fevers, chills, shortness of breath.  Mild cough today  Medications:  Current Facility-Administered Medications  Medication Dose Route Frequency Provider Last Rate Last Admin  . acetaminophen (TYLENOL) tablet 325 mg  325 mg Oral Q6H PRN Wynetta Fines T, MD      . famotidine (PEPCID) tablet 20 mg  20 mg Oral Daily Wynetta Fines T, MD   20 mg at 12/28/20 0826  . ferrous sulfate tablet 325 mg  325 mg Oral Q breakfast Wynetta Fines T, MD   325 mg at 12/28/20 0827  . heparin injection 5,000 Units  5,000 Units Subcutaneous Q8H Lequita Halt, MD   5,000 Units at 12/28/20 1301  . ipratropium (ATROVENT) 0.06 % nasal spray 1 spray  1 spray Each Nare BID Lequita Halt, MD   1 spray at 12/28/20  2549  . lactated ringers infusion   Intravenous Continuous Reesa Chew, MD      . lactobacillus acidophilus (BACID) tablet 2 tablet  2 tablet Oral TID Lequita Halt, MD   2 tablet at 12/28/20 1451  . loperamide (IMODIUM) capsule 2 mg  2 mg Oral BID [name], MD      .  loratadine (CLARITIN) tablet 10 mg  10 mg Oral Daily Wynetta Fines T, MD   10 mg at 12/28/20 8264  . mesalamine (PENTASA) CR capsule 1,000 mg  1,000 mg Oral QID Wynetta Fines T, MD   1,000 mg at 12/28/20 1450  . midodrine (PROAMATINE) tablet 5 mg  5 mg Oral TID WC Reesa Chew, MD      . primidone (MYSOLINE) tablet 250 mg  250 mg Oral Daily Wynetta Fines T, MD   250 mg at 12/28/20 1583  . primidone (MYSOLINE) tablet 500 mg  500 mg Oral QHS Wynetta Fines T, MD   500 mg at 12/27/20 2254  . tamsulosin (FLOMAX) capsule 0.4 mg  0.4 mg Oral Daily Wynetta Fines T, MD   0.4 mg at 12/28/20 0825  . triamcinolone (KENALOG) 0.1 % cream 1 application  1 application Topical Daily Lequita Halt, MD   1 application at 09/40/76 0830     ALLERGIES Darvon [propoxyphene] and Other  MEDICAL HISTORY Past Medical History:  Diagnosis Date  . Abscess of anal and rectal regions   . Allergic rhinitis   . Aortic valve insufficiency    non-rheumatic   . BPH (benign prostatic hyperplasia)   . CHF (congestive heart failure) (HCC)     chronic diastolic heart failure   . Cholelithiasis   . Crohn's disease ([place])   . Dizziness and giddiness   . DVT (deep venous thrombosis) ([place])   . Dysphagia   . Epilepsy ([place])    "petit mal; only at home" (6/9//2015)  . Heart murmur   . Hepatomegaly   . History of blood transfusion ~ 1992   "related to my Crohn's"  . History of immunosuppression therapy   . History of stomach ulcers   . Hyperplastic colon polyp   . Hypokalemia    hx of   . Iron deficiency anemia, unspecified    iron deficiency anemia   . Malaise   . Mental disability   . Muscle weakness (generalized)   . Other B-complex deficiencies   . REGIONAL ENTERITIS, LARGE INTESTINE 10/05/2000   Qualifier: Diagnosis of  By: Jerral Ralph    . S/P partial colectomy for Crohn's disease 12/01/2015  . SBO (small bowel obstruction) ([place]) 12/01/2015  . Sepsis ([place])    hx of   . Severe protein-calorie malnutrition  Altamease Oiler: less than 60% of standard weight) ([place])   . Syncope 03/31/2014  . Syncope and collapse 03/31/2014   "while driving"  . Tremor   . Unsteadiness on feet   . Urticaria   . Vitamin D deficiency      SOCIAL HISTORY Social History   Socioeconomic History  . Marital status: Single    Spouse name: Not on file  . Number of children: 0  . Years of education: Not on file  . Highest education level: Not on file  Occupational History  . Occupation: Disabled  Tobacco Use  . Smoking status: Never Smoker  . Smokeless tobacco: Never Used  Vaping Use  . Vaping Use: Never used  Substance and Sexual Activity  . Alcohol use: No  Alcohol/week: 0.0 standard drinks  . Drug use: No  . Sexual activity: Never  Other Topics Concern  . Not on file  Social History Narrative   Lives at [place] Strain: Not on file  Food Insecurity: Not on file  Transportation Needs: Not on file  Physical Activity: Not on file  Stress: Not on file  Social Connections: Not on file  Intimate Partner Violence: Not on file     FAMILY HISTORY Family History  Problem Relation Age of Onset  . Skin cancer Father   . Colon cancer Mother   . Dementia Mother   . Seizures Neg Hx   . Crohn's disease Neg Hx   . Esophageal cancer Neg Hx   . Rectal cancer Neg Hx   . Stomach cancer Neg Hx       Review of Systems: 12 systems reviewed Otherwise as per HPI, all other systems reviewed and negative  Physical Exam: Vitals:   12/28/20 0733 12/28/20 1115  BP: (!) 73/40 (!) 96/55  Pulse: 74 95  Resp: 10 16  Temp:    SpO2: 97% 98%   No intake/output data recorded.  Intake/Output Summary (Last 24 hours) at 12/28/2020 1543 Last data filed at 12/27/2020 1624 Gross per 24 hour  Intake 850 ml  Output --  Net 850 ml   General: well-appearing, no acute distress HEENT: anicteric sclera, oropharynx clear without lesions CV: Normal rate, no audible murmur, no  peripheral edema Lungs: clear to auscultation bilaterally, normal work of breathing Abd: soft, non-tender, non-distended Skin: Chronic facial rash, otherwise no visible lesions or rashes Psych: alert, engaged, appropriate mood and affect Musculoskeletal: no obvious deformities Neuro: normal speech, no gross focal deficits   Test Results Reviewed Lab Results  Component Value Date   NA 136 12/28/2020   K 4.0 12/28/2020   CL 99 12/28/2020   CO2 21 (L) 12/28/2020   BUN 79 (H) 12/28/2020   CREATININE 5.50 (H) 12/28/2020   GFR 57.32 (L) 07/27/2020   CALCIUM 8.1 (L) 12/28/2020   ALBUMIN 2.5 (L) 12/27/2020   PHOS 2.9 01/16/2016     I have reviewed all relevant outside healthcare records related to the patient's current hospitalization

## 2020-12-29 ENCOUNTER — Telehealth: Payer: Self-pay

## 2020-12-29 ENCOUNTER — Inpatient Hospital Stay (HOSPITAL_COMMUNITY): Payer: Medicare Other

## 2020-12-29 DIAGNOSIS — A09 Infectious gastroenteritis and colitis, unspecified: Secondary | ICD-10-CM

## 2020-12-29 LAB — ECHOCARDIOGRAM COMPLETE
AR max vel: 1.61 cm2
AV Area VTI: 1.45 cm2
AV Area mean vel: 1.53 cm2
AV Mean grad: 14.5 mmHg
AV Peak grad: 24.2 mmHg
Ao pk vel: 2.46 m/s
Area-P 1/2: 4.21 cm2
Height: 69 in
MV VTI: 2.53 cm2
P 1/2 time: 494 msec
S' Lateral: 2.8 cm
Weight: 3530.89 oz

## 2020-12-29 LAB — BASIC METABOLIC PANEL
Anion gap: 10 (ref 5–15)
BUN: 52 mg/dL — ABNORMAL HIGH (ref 8–23)
CO2: 27 mmol/L (ref 22–32)
Calcium: 7.6 mg/dL — ABNORMAL LOW (ref 8.9–10.3)
Chloride: 102 mmol/L (ref 98–111)
Creatinine, Ser: 2.62 mg/dL — ABNORMAL HIGH (ref 0.61–1.24)
GFR, Estimated: 26 mL/min — ABNORMAL LOW (ref >60)
Glucose, Bld: 119 mg/dL — ABNORMAL HIGH (ref 70–99)
Potassium: 3 mmol/L — ABNORMAL LOW (ref 3.5–5.1)
Sodium: 139 mmol/L (ref 135–145)

## 2020-12-29 LAB — VITAMIN B12: Vitamin B-12: 785 pg/mL (ref 180–914)

## 2020-12-29 MED ORDER — PERFLUTREN LIPID MICROSPHERE
1.0000 mL | INTRAVENOUS | Status: AC | PRN
Start: 2020-12-29 — End: 2020-12-29
  Administered 2020-12-29: 3 mL via INTRAVENOUS
  Filled 2020-12-29: qty 10

## 2020-12-29 MED ORDER — POTASSIUM CHLORIDE CRYS ER 10 MEQ PO TBCR
30.0000 meq | EXTENDED_RELEASE_TABLET | Freq: Four times a day (QID) | ORAL | Status: AC
  Administered 2020-12-29 (×2): 30 meq via ORAL
  Filled 2020-12-29 (×2): qty 1

## 2020-12-29 NOTE — Progress Notes (Addendum)
Nephrology Follow-Up Consult note   Assessment/Recommendations: [name] is a/an 65 y.o. male with a past medical history significant for Crohn's, hypertension, seizures, aortic insufficiency  who present w/ Crohn's flare? C/b AKI  Nonoliguric severe AKI secondary to dehydration, improving:  AKI secondary to dehydration related to GI illness.  Now significantly improved with creatinine going from 2.4 to 2.6.  His hypotension also likely contributing to AKI -We will sign off at this time given his rapidly improving kidney function -Hold Home lisinopril -Midodrine for hypotension as below -Continue to monitor daily Cr, Dose meds for GFR -Monitor Daily I/Os, Daily weight  -Maintain MAP>65 for optimal renal perfusion.  -Avoid nephrotoxic medications including NSAIDs and Vanc/Zosyn combo -Renal ultrasound without obstruction -Currently no indication for HD  Crohn's disease: With nausea, vomiting, diarrhea.  Not definitively felt to be a flareup per GI.  Continue to follow with their management.  C. difficile negative   Hypotension: History of hypertension now with significantly low blood pressures.    Stopped IV hydration today.  Continue midodrine 5 mg 3 times daily ideally with systolic greater than 540.  Can titrate off as the patient's blood pressure improves  Severe sepsis: Thought to be secondary to viral gastroenteritis.  Follow-up work-up.  Hypokalemia: Likely renal losses but also some GI losses.  Replacement today and continue replacement as needed  Recommendations conveyed to primary service.    [facility] 12/29/2020 11:25 AM  ___________________________________________________________  CC: AKI  Interval History/Subjective: Patient states he feels well today.  Urine output increasing but some uncollected measurements.  Overall feels well   Medications:  Current Facility-Administered Medications  Medication Dose Route Frequency  Provider Last Rate Last Admin  . acetaminophen (TYLENOL) tablet 325 mg  325 mg Oral Q6H PRN Wynetta Fines T, MD      . famotidine (PEPCID) tablet 20 mg  20 mg Oral Daily Wynetta Fines T, MD   20 mg at 12/29/20 0818  . ferrous sulfate tablet 325 mg  325 mg Oral Q breakfast Wynetta Fines T, MD   325 mg at 12/29/20 0819  . heparin injection 5,000 Units  5,000 Units Subcutaneous Q8H Lequita Halt, MD   5,000 Units at 12/29/20 [phone]  . ipratropium (ATROVENT) 0.06 % nasal spray 1 spray  1 spray Each Nare BID Lequita Halt, MD   1 spray at 12/29/20 [phone]  . lactobacillus acidophilus (BACID) tablet 2 tablet  2 tablet Oral TID Lequita Halt, MD   2 tablet at 12/29/20 0815  . loperamide (IMODIUM) capsule 2 mg  2 mg Oral BID [name], MD   2 mg at 12/29/20 0820  . loratadine (CLARITIN) tablet 10 mg  10 mg Oral Daily Wynetta Fines T, MD   10 mg at 12/29/20 0818  . mesalamine (PENTASA) CR capsule 1,000 mg  1,000 mg Oral QID Wynetta Fines T, MD   1,000 mg at 12/29/20 0932  . midodrine (PROAMATINE) tablet 5 mg  5 mg Oral TID WC Reesa Chew, MD   5 mg at 12/29/20 0818  . potassium chloride (KLOR-CON) CR tablet 30 mEq  30 mEq Oral Q6H Reesa Chew, MD   30 mEq at 12/29/20 0820  . primidone (MYSOLINE) tablet 250 mg  250 mg Oral Daily Wynetta Fines T, MD   250 mg at 12/29/20 0819  . primidone (MYSOLINE) tablet 500 mg  500 mg Oral QHS Wynetta Fines T, MD   500 mg at 12/28/20  2058  . tamsulosin (FLOMAX) capsule 0.4 mg  0.4 mg Oral Daily Wynetta Fines T, MD   0.4 mg at 12/29/20 0817  . triamcinolone (KENALOG) 0.1 % cream 1 application  1 application Topical Daily Lequita Halt, MD   1 application at 24/40/10 0830      Review of Systems: 10 systems reviewed and negative except per interval history/subjective  Physical Exam: Vitals:   12/29/20 0705 12/29/20 0800  BP:  (!) 96/49  Pulse:  83  Resp:    Temp:  98 F (36.7 C)  SpO2: 90% 90%   No intake/output data recorded.  Intake/Output Summary (Last 24  hours) at 12/29/2020 1125 Last data filed at 12/29/2020 2725 Gross per 24 hour  Intake 2004.48 ml  Output --  Net 2004.48 ml   General: well-appearing, no acute distress HEENT: anicteric sclera, oropharynx clear without lesions CV: Normal rate, no audible murmur, no peripheral edema Lungs: Bilateral chest rise, normal work of breathing Abd: soft, non-tender, non-distended Skin: Chronic facial rash, otherwise no visible lesions or rashes Psych: alert, engaged, appropriate mood and affect Neuro: normal speech, no gross focal deficits    Test Results I personally reviewed new and old clinical labs and radiology tests Lab Results  Component Value Date   NA 139 12/29/2020   K 3.0 (L) 12/29/2020   CL 102 12/29/2020   CO2 27 12/29/2020   BUN 52 (H) 12/29/2020   CREATININE 2.62 (H) 12/29/2020   GFR 57.32 (L) 07/27/2020   CALCIUM 7.6 (L) 12/29/2020   ALBUMIN 2.3 (L) 12/28/2020   PHOS 2.9 01/16/2016

## 2020-12-29 NOTE — Progress Notes (Signed)
[name] Gastroenterology Progress Note    Since last GI note: He is tolerating solid foods without nausea or vomiting.   I started imodium yesterday on scheduled basis and this has completely stopped his loose stools  Objective: Vital signs in last 24 hours: Temp:  [98 F (36.7 C)-99.4 F (37.4 C)] 98.1 F (36.7 C) (03/09 1142) Pulse Rate:  [83-89] 83 (03/09 1142) Resp:  [16-20] 18 (03/09 1142) BP: (88-114)/(45-53) 114/53 (03/09 1142) SpO2:  [90 %-98 %] 92 % (03/09 1142) Weight:  [100.1 kg] 100.1 kg (03/08 1958) Last BM Date: 12/28/20 General: alert and oriented times 3 Heart: regular rate and rythm Abdomen: soft, non-tender, non-distended, normal bowel sounds   Lab Results: Recent Labs    12/27/20 1639 12/27/20 1650  WBC 9.4  --   HGB 11.5* 11.2*  PLT 229  --   MCV 103.8*  --    Recent Labs    12/27/20 1151 12/27/20 1650 12/28/20 0307 12/29/20 0254  NA 138 138 136 139  K 4.8 4.5 4.0 3.0*  CL 107  --  99 102  CO2 11*  --  21* 27  GLUCOSE 98  --  111* 119*  BUN 75*  --  79* 52*  CREATININE 6.36*  --  5.50* 2.62*  CALCIUM 7.3*  --  8.1* 7.6*   Recent Labs    12/27/20 1151 12/28/20 1550  PROT 6.6  --   ALBUMIN 2.5* 2.3*  AST 17  --   ALT 12  --   ALKPHOS 65  --   BILITOT 0.5  --    Recent Labs    12/27/20 1245  INR 1.3*     Studies/Results: US RENAL  Result Date: 12/27/2020 CLINICAL DATA:  Acute renal injury EXAM: RENAL / URINARY TRACT ULTRASOUND COMPLETE COMPARISON:  04/23/2018 FINDINGS: Right Kidney: Renal measurements: 9.2 x 4.6 x 4.0 cm. = volume: 87 mL. Diffuse increased echogenicity is noted. No mass lesion or hydronephrosis is seen. Left Kidney: Renal measurements: 10.8 x 5.4 x 5.7 cm. = volume: 173 mL. No mass lesion or hydronephrosis is noted. Bladder: Decompressed Other: Fluid-filled bowel is noted just superior to the bladder. IMPRESSION: No mass lesion or hydronephrosis is noted. Increased echogenicity in the right kidney is noted  consistent with medical renal disease. The echo pattern of the left kidney is incompletely evaluated. Electronically Signed   By: Inez Catalina M.D.   On: 12/27/2020 19:52   DG Chest Port 1 View  Result Date: 12/27/2020 CLINICAL DATA:  Questionable sepsis EXAM: PORTABLE CHEST 1 VIEW COMPARISON:  July 2019 FINDINGS: Low lung volumes. No consolidation or edema. No significant pleural effusion. No pneumothorax. Similar cardiomediastinal contours. IMPRESSION: No acute process in the chest. Electronically Signed   By: Macy Mis M.D.   On: 12/27/2020 12:49     Medications: Scheduled Meds: . famotidine  20 mg Oral Daily  . ferrous sulfate  325 mg Oral Q breakfast  . heparin  5,000 Units Subcutaneous Q8H  . ipratropium  1 spray Each Nare BID  . lactobacillus acidophilus  2 tablet Oral TID  . loperamide  2 mg Oral BID  . loratadine  10 mg Oral Daily  . mesalamine  1,000 mg Oral QID  . midodrine  5 mg Oral TID WC  . primidone  250 mg Oral Daily  . primidone  500 mg Oral QHS  . tamsulosin  0.4 mg Oral Daily  . triamcinolone  1 application Topical Daily   Continuous Infusions:  PRN Meds:.acetaminophen    Assessment/Plan: 65 y.o. male with acute vomiting, diarrheal illness in setting of long standing Crohn's.  I do not think this acute illness was related to his Crohn's disease. He is much improved since admission. He received Iv ABx on day one which were stopped because he was recovering very quickly. GI pathogen panel and C diff testing were negative.    He is safe for d/c later today. Should resume his usual Crohn's meds that he was on PTA: Stelara every 2 months, azathioprine 50 mg daily, Pentasa 1 g qid.   He can take an OTC imodium 1-2 times daily PRN.  [facility] will reach out to schedule follow up with [name] or extender in the next 3-4 weeks.  Please call or page with any further questions or concerns.    [name], MD  12/29/2020, 12:37 PM [place]  [facility] Pager [phone]

## 2020-12-29 NOTE — Care Management Important Message (Signed)
Important Message  Patient Details  Name: [name] MRN: [id number] Date of Birth: 1955-11-06   Medicare Important Message Given:  Yes  Patient has a lprecaution in place will mail the  IM  To patient home address.    Sheriece Jefcoat 12/29/2020, 2:03 PM

## 2020-12-29 NOTE — Telephone Encounter (Signed)
Office appt has been made for 4/6 with Dr Hilarie Fredrickson for follow up of post hospital vomiting-diarrhea. Letter mailed to the pt with appt information.

## 2020-12-29 NOTE — Progress Notes (Signed)
PROGRESS NOTE    [name]  RSW:[id number] DOB: September 27, 1956 DOA: 12/27/2020 PCP: Leonard Downing, MD     Brief Narrative:  Jonathan Paganini Sheppardis a 65 y.o.malewith medical history significant ofCrohn's disease on Imuran,HTN,mental retardation, seizure disorder,BPH,aortic stenosis and regurgitation, presented with worsening of diarrhea, new onset of nausea with vomiting and weakness.  Patient's Crohn's disease was not well controlled and his GI doctor recently started patient on Imuran about 3 weeks ago. Patient had 2 episodes of flareup after Imuran started,first episode resolved by its own. 1 week ago he started to have loose diarrhea, frequent nausea and vomiting of stomach content, initially he attributed to mustard hot dog he ate one week ago, but thefrequent diarrhea lasted about 5 daysand subsided by its own, and no diarrhea for last 2-3 days. Denied any abdominal pain, no fever chills. His appetite significantly affected, and he has not been eating drinking muchfor last 2 days and minimal urine output. He also noticed his urine output has significantly reduced.  Patient was admitted with diagnosis of acute kidney injury with creatinine up to 6.3.  New events last 24 hours / Subjective: Very pleasant this morning, denies any further diarrhea.  No new complaints on examination today.  Assessment & Plan:   Active Problems:   AKI (acute kidney injury) ([place])   Pressure injury of skin   AKI -Secondary to prerenal etiology, dehydration, hypotension -Improving -Nephrology signed off today  Severe sepsis likely secondary to viral gastroenteritis -Patient met severe sepsis criteria based on tachycardia, tachypnea with lactic acid of 3.8 on admission -Improved -C. difficile, GI PCR panel negative -GI signed off today  Crohn's disease, without exacerbation -Continue Stelara, azathioprine, Pentasa  Hypotension -Continue midodrine.  Can titrate off as patient's  blood pressure improves  Hypokalemia -Replace, trend  Mental retardation -At baseline   In agreement with assessment of the pressure ulcer as below:  Pressure Injury 12/27/20 Coccyx Medial Stage 1 -  Intact skin with non-blanchable redness of a localized area usually over a bony prominence. (Active)  12/27/20 2300  Location: Coccyx  Location Orientation: Medial  Staging: Stage 1 -  Intact skin with non-blanchable redness of a localized area usually over a bony prominence.  Wound Description (Comments):   Present on Admission: Yes         DVT prophylaxis:  heparin injection 5,000 Units Start: 12/27/20 1445  Code Status: DNR Family Communication: Updated sister [name] over the phone today Disposition Plan:  Status is: Inpatient  Remains inpatient appropriate because:Inpatient level of care appropriate due to severity of illness   Dispo: The patient is from: SNF              Anticipated d/c is to: SNF              Patient currently is not medically stable to d/c.  Hopeful to discharge back to SNF tomorrow pending repeat BMP   Difficult to place patient No   Antimicrobials:  Anti-infectives (From admission, onward)   Start     Dose/Rate Route Frequency Ordered Stop   12/28/20 1200  ceFEPIme (MAXIPIME) 2 g in sodium chloride 0.9 % 100 mL IVPB  Status:  Discontinued        2 g 200 mL/hr over 30 Minutes Intravenous Every 24 hours 12/27/20 1543 12/27/20 1548   12/27/20 1542  vancomycin variable dose per unstable renal function (pharmacist dosing)  Status:  Discontinued         Does not apply See  admin instructions 12/27/20 1543 12/27/20 1549   12/27/20 1200  ceFEPIme (MAXIPIME) 2 g in sodium chloride 0.9 % 100 mL IVPB        2 g 200 mL/hr over 30 Minutes Intravenous  Once 12/27/20 1151 12/27/20 1242   12/27/20 1200  metroNIDAZOLE (FLAGYL) IVPB 500 mg        500 mg 100 mL/hr over 60 Minutes Intravenous  Once 12/27/20 1151 12/27/20 1348   12/27/20 1200  vancomycin  (VANCOREADY) IVPB 1750 mg/350 mL  Status:  Discontinued        1,750 mg 175 mL/hr over 120 Minutes Intravenous  Once 12/27/20 1151 12/27/20 1624        Objective: Vitals:   12/29/20 0417 12/29/20 0705 12/29/20 0800 12/29/20 1142  BP: (!) 105/48  (!) 96/49 (!) 114/53  Pulse: 84  83 83  Resp: 16   18  Temp: 99.4 F (37.4 C)  98 F (36.7 C) 98.1 F (36.7 C)  TempSrc: Oral  Oral Oral  SpO2: 92% 90% 90% 92%  Weight:      Height:        Intake/Output Summary (Last 24 hours) at 12/29/2020 1444 Last data filed at 12/29/2020 0238 Gross per 24 hour  Intake 2004.48 ml  Output -  Net 2004.48 ml   Filed Weights   12/28/20 0808 12/28/20 1958  Weight: 100.1 kg 100.1 kg    Examination: \ General exam: Appears calm and comfortable  Respiratory system: Clear to auscultation. Respiratory effort normal. No respiratory distress. No conversational dyspnea.  Cardiovascular system: S1 & S2 heard, RRR. No murmurs. No pedal edema. Gastrointestinal system: Abdomen is nondistended, soft and nontender. Normal bowel sounds heard. Central nervous system: Alert and oriented. No focal neurological deficits. Speech clear.  Extremities: Symmetric in appearance  Skin: No rashes, lesions or ulcers on exposed skin  Psychiatry: Stable   Data Reviewed: I have personally reviewed following labs and imaging studies  CBC: Recent Labs  Lab 12/27/20 1639 12/27/20 1650  WBC 9.4  --   NEUTROABS 7.1  --   HGB 11.5* 11.2*  HCT 35.7* 33.0*  MCV 103.8*  --   PLT 229  --    Basic Metabolic Panel: Recent Labs  Lab 12/27/20 1151 12/27/20 1650 12/28/20 0307 12/29/20 0254  NA 138 138 136 139  K 4.8 4.5 4.0 3.0*  CL 107  --  99 102  CO2 11*  --  21* 27  GLUCOSE 98  --  111* 119*  BUN 75*  --  79* 52*  CREATININE 6.36*  --  5.50* 2.62*  CALCIUM 7.3*  --  8.1* 7.6*   GFR: Estimated Creatinine Clearance: 32.4 mL/min (A) (by C-G formula based on SCr of 2.62 mg/dL (H)). Liver Function Tests: Recent Labs   Lab 12/27/20 1151 12/28/20 1550  AST 17  --   ALT 12  --   ALKPHOS 65  --   BILITOT 0.5  --   PROT 6.6  --   ALBUMIN 2.5* 2.3*   No results for input(s): LIPASE, AMYLASE in the last 168 hours. No results for input(s): AMMONIA in the last 168 hours. Coagulation Profile: Recent Labs  Lab 12/27/20 1245  INR 1.3*   Cardiac Enzymes: Recent Labs  Lab 12/28/20 0307  CKTOTAL 401*   BNP (last 3 results) No results for input(s): PROBNP in the last 8760 hours. HbA1C: No results for input(s): HGBA1C in the last 72 hours. CBG: Recent Labs  Lab 12/27/20 1152 12/27/20 2302  GLUCAP 116* 100*   Lipid Profile: No results for input(s): CHOL, HDL, LDLCALC, TRIG, CHOLHDL, LDLDIRECT in the last 72 hours. Thyroid Function Tests: No results for input(s): TSH, T4TOTAL, FREET4, T3FREE, THYROIDAB in the last 72 hours. Anemia Panel: Recent Labs    12/29/20 0254  VITAMINB12 785   Sepsis Labs: Recent Labs  Lab 12/27/20 1833 12/28/20 0307 12/28/20 1550 12/28/20 1825  LATICACIDVEN 1.8 2.0* 2.1* 1.3    Recent Results (from the past 240 hour(s))  Blood Culture (routine x 2)     Status: None (Preliminary result)   Collection Time: 12/27/20 11:56 AM   Specimen: BLOOD LEFT HAND  Result Value Ref Range Status   Specimen Description BLOOD LEFT HAND  Final   Special Requests   Final    BOTTLES DRAWN AEROBIC ONLY Blood Culture results may not be optimal due to an inadequate volume of blood received in culture bottles   Culture   Final    NO GROWTH 2 DAYS Performed at [facility], [place] [address], [place], [place] [address]    Report Status PENDING  Incomplete  Resp Panel by RT-PCR (Flu A&B, Covid) Nasopharyngeal Swab     Status: None   Collection Time: 12/27/20 12:32 PM   Specimen: Nasopharyngeal Swab; Nasopharyngeal(NP) swabs in vial transport medium  Result Value Ref Range Status   SARS Coronavirus 2 by RT PCR NEGATIVE NEGATIVE Final    Comment: (NOTE) SARS-CoV-2 target  nucleic acids are NOT DETECTED.  The SARS-CoV-2 RNA is generally detectable in upper respiratory specimens during the acute phase of infection. The lowest concentration of SARS-CoV-2 viral copies this assay can detect is 138 copies/mL. A negative result does not preclude SARS-Cov-2 infection and should not be used as the sole basis for treatment or other patient management decisions. A negative result may occur with  improper specimen collection/handling, submission of specimen other than nasopharyngeal swab, presence of viral mutation(s) within the areas targeted by this assay, and inadequate number of viral copies(<138 copies/mL). A negative result must be combined with clinical observations, patient history, and epidemiological information. The expected result is Negative.  Fact Sheet for Patients:  EntrepreneurPulse.com.au  Fact Sheet for Healthcare Providers:  IncredibleEmployment.be  This test is no t yet approved or cleared by the [place] FDA and  has been authorized for detection and/or diagnosis of SARS-CoV-2 by FDA under an Emergency Use Authorization (EUA). This EUA will remain  in effect (meaning this test can be used) for the duration of the COVID-19 declaration under Section 564(b)(1) of the Act, 21 U.S.C.section 360bbb-3(b)(1), unless the authorization is terminated  or revoked sooner.       Influenza A by PCR NEGATIVE NEGATIVE Final   Influenza B by PCR NEGATIVE NEGATIVE Final    Comment: (NOTE) The Xpert Xpress SARS-CoV-2/FLU/RSV plus assay is intended as an aid in the diagnosis of influenza from Nasopharyngeal swab specimens and should not be used as a sole basis for treatment. Nasal washings and aspirates are unacceptable for Xpert Xpress SARS-CoV-2/FLU/RSV testing.  Fact Sheet for Patients: EntrepreneurPulse.com.au  Fact Sheet for Healthcare  Providers: IncredibleEmployment.be  This test is not yet approved or cleared by the [place] FDA and has been authorized for detection and/or diagnosis of SARS-CoV-2 by FDA under an Emergency Use Authorization (EUA). This EUA will remain in effect (meaning this test can be used) for the duration of the COVID-19 declaration under Section 564(b)(1) of the Act, 21 U.S.C. section 360bbb-3(b)(1), unless the authorization is terminated  or revoked.  Performed at [facility], [place] [address] [place], [place] [address]   Blood Culture (routine x 2)     Status: None (Preliminary result)   Collection Time: 12/27/20 12:39 PM   Specimen: BLOOD RIGHT FOREARM  Result Value Ref Range Status   Specimen Description BLOOD RIGHT FOREARM  Final   Special Requests   Final    BOTTLES DRAWN AEROBIC ONLY Blood Culture results may not be optimal due to an inadequate volume of blood received in culture bottles   Culture   Final    NO GROWTH 2 DAYS Performed at [facility], [place] [address], [place], [place] [address]    Report Status PENDING  Incomplete  Gastrointestinal Panel by PCR , Stool     Status: None   Collection Time: 12/28/20  6:15 AM   Specimen: STOOL  Result Value Ref Range Status   Campylobacter species NOT DETECTED NOT DETECTED Final   Plesimonas shigelloides NOT DETECTED NOT DETECTED Final   Salmonella species NOT DETECTED NOT DETECTED Final   Yersinia enterocolitica NOT DETECTED NOT DETECTED Final   Vibrio species NOT DETECTED NOT DETECTED Final   Vibrio cholerae NOT DETECTED NOT DETECTED Final   Enteroaggregative E coli (EAEC) NOT DETECTED NOT DETECTED Final   Enteropathogenic E coli (EPEC) NOT DETECTED NOT DETECTED Final   Enterotoxigenic E coli (ETEC) NOT DETECTED NOT DETECTED Final   Shiga like toxin producing E coli (STEC) NOT DETECTED NOT DETECTED Final   Shigella/Enteroinvasive E coli (EIEC) NOT DETECTED NOT DETECTED Final   Cryptosporidium NOT  DETECTED NOT DETECTED Final   Cyclospora cayetanensis NOT DETECTED NOT DETECTED Final   Entamoeba histolytica NOT DETECTED NOT DETECTED Final   Giardia lamblia NOT DETECTED NOT DETECTED Final   Adenovirus F40/41 NOT DETECTED NOT DETECTED Final   Astrovirus NOT DETECTED NOT DETECTED Final   Norovirus GI/GII NOT DETECTED NOT DETECTED Final   Rotavirus A NOT DETECTED NOT DETECTED Final   Sapovirus (I, II, IV, and V) NOT DETECTED NOT DETECTED Final    Comment: Performed at [facility], [place] [place], [place] [address]  C Difficile Quick Screen w PCR reflex     Status: None   Collection Time: 12/28/20  6:15 AM   Specimen: STOOL  Result Value Ref Range Status   C Diff antigen NEGATIVE NEGATIVE Final   C Diff toxin NEGATIVE NEGATIVE Final   C Diff interpretation No C. difficile detected.  Final    Comment: Performed at [facility], [place] [address] [place], [place] [address]  MRSA PCR Screening     Status: Abnormal   Collection Time: 12/28/20  4:36 PM   Specimen: Nasal Mucosa; Nasopharyngeal  Result Value Ref Range Status   MRSA by PCR POSITIVE (A) NEGATIVE Final    Comment:        The GeneXpert MRSA Assay (FDA approved for NASAL specimens only), is one component of a comprehensive MRSA colonization surveillance program. It is not intended to diagnose MRSA infection nor to guide or monitor treatment for MRSA infections. RESULT CALLED TO, READ BACK BY AND VERIFIED WITH: P CHAILENDRA RN 12/28/20 2051 JDW Performed at [facility] [address] [place], [place] [address]       Radiology Studies: US RENAL  Result Date: 12/27/2020 CLINICAL DATA:  Acute renal injury EXAM: RENAL / URINARY TRACT ULTRASOUND COMPLETE COMPARISON:  04/23/2018 FINDINGS: Right Kidney: Renal measurements: 9.2 x 4.6 x 4.0 cm. =  volume: 87 mL. Diffuse increased echogenicity is noted. No mass lesion or hydronephrosis is seen. Left Kidney: Renal measurements: 10.8 x 5.4 x 5.7 cm. =  volume: 173 mL. No mass lesion or hydronephrosis is noted. Bladder: Decompressed Other: Fluid-filled bowel is noted just superior to the bladder. IMPRESSION: No mass lesion or hydronephrosis is noted. Increased echogenicity in the right kidney is noted consistent with medical renal disease. The echo pattern of the left kidney is incompletely evaluated. Electronically Signed   By: Inez Catalina M.D.   On: 12/27/2020 19:52      Scheduled Meds: . famotidine  20 mg Oral Daily  . ferrous sulfate  325 mg Oral Q breakfast  . heparin  5,000 Units Subcutaneous Q8H  . ipratropium  1 spray Each Nare BID  . lactobacillus acidophilus  2 tablet Oral TID  . loperamide  2 mg Oral BID  . loratadine  10 mg Oral Daily  . mesalamine  1,000 mg Oral QID  . midodrine  5 mg Oral TID WC  . primidone  250 mg Oral Daily  . primidone  500 mg Oral QHS  . tamsulosin  0.4 mg Oral Daily  . triamcinolone  1 application Topical Daily   Continuous Infusions:   LOS: 2 days      Time spent: 25 minutes   Dessa Phi, DO [facility] 12/29/2020, 2:44 PM   Available via Epic secure chat 7am-7pm After these hours, please refer to coverage provider listed on [email or web address]

## 2020-12-29 NOTE — Telephone Encounter (Signed)
-----   Message from [name], MD sent at 12/29/2020 12:43 PM EST ----- He needs OV with Pyrtle or extender in 3-4 weeks, hosp follow up acute vomiting/diarrheal illness.  Thanks

## 2020-12-30 DIAGNOSIS — I959 Hypotension, unspecified: Secondary | ICD-10-CM

## 2020-12-30 LAB — BASIC METABOLIC PANEL
Anion gap: 8 (ref 5–15)
BUN: 35 mg/dL — ABNORMAL HIGH (ref 8–23)
CO2: 27 mmol/L (ref 22–32)
Calcium: 7.8 mg/dL — ABNORMAL LOW (ref 8.9–10.3)
Chloride: 106 mmol/L (ref 98–111)
Creatinine, Ser: 1.69 mg/dL — ABNORMAL HIGH (ref 0.61–1.24)
GFR, Estimated: 44 mL/min — ABNORMAL LOW (ref >60)
Glucose, Bld: 110 mg/dL — ABNORMAL HIGH (ref 70–99)
Potassium: 3.8 mmol/L (ref 3.5–5.1)
Sodium: 141 mmol/L (ref 135–145)

## 2020-12-30 LAB — FOLATE RBC
Folate, Hemolysate: 353 ng/mL
Folate, RBC: 1135 ng/mL (ref >498)
Hematocrit: 31.1 % — ABNORMAL LOW (ref 37.5–51.0)

## 2020-12-30 LAB — SARS CORONAVIRUS 2 (TAT 6-24 HRS): SARS Coronavirus 2: NEGATIVE

## 2020-12-30 MED ORDER — MIDODRINE HCL 5 MG PO TABS: 5.0000 mg | ORAL_TABLET | Freq: Three times a day (TID) | ORAL | 0 refills | Status: AC

## 2020-12-30 NOTE — Discharge Summary (Signed)
Physician Discharge Summary  TAYLOR LEVICK INO:[id number] DOB: 1956/10/17 DOA: 12/27/2020  PCP: Leonard Downing, MD  Admit date: 12/27/2020 Discharge date: 12/30/2020  Admitted From: SNF Disposition: SNF  Recommendations for Outpatient Follow-up:  1. Follow up with PCP in 1 week 2. Follow up with GI as scheduled  3. Repeat BMP in 1 week to check creatinine.  AKI improving at the time of discharge. Lasix and lisinopril on hold 4. Wean and titrate off midodrine as tolerated by his blood pressure  Discharge Condition: Stable CODE STATUS: DNR  Diet recommendation: Regular  Brief/Interim Summary: Johncharles Fusselman Sheppardis a 65 y.o.malewith medical history significant ofCrohn's disease on Imuran,HTN,mental retardation, seizure disorder,BPH,aortic stenosis and regurgitation, presented with worsening of diarrhea, new onset of nausea with vomiting and weakness.  Patient's Crohn's disease was not well controlled and his GI doctor recently started patient on Imuran about 3 weeks ago. Patient had 2 episodes of flareup after Imuran started,first episode resolved by its own. 1 week ago he started to have loose diarrhea, frequent nausea and vomiting of stomach content, initially he attributed to mustard hot dog he ate one week ago, but thefrequent diarrhea lasted about 5 daysand subsided by its own, and no diarrhea for last 2-3 days. Denied any abdominal pain, no fever chills. His appetite significantly affected, and he has not been eating drinking muchfor last 2 days and minimal urine output. He also noticed his urine output has significantly reduced.  Patient was admitted with diagnosis of acute kidney injury with creatinine up to 6.3.  GI as well as nephrology were consulted due to AKI and severe sepsis with viral gastroenteritis.  Patient's kidney function as well as diarrhea continue to improve with supportive care.  Patient was discharged back to his skilled nursing facility in stable  and improved condition.  Discharge Diagnoses:  Principal Problem:   AKI (acute kidney injury) ([place]) Active Problems:   Crohn's disease ([place])   Nonrheumatic aortic valve stenosis   Mental disability   Pressure injury of skin   Hypotension    AKI -Secondary to prerenal etiology, dehydration, hypotension -Improving -Appreciate Nephrology   Severe sepsis likely secondary to viral gastroenteritis -Patient met severe sepsis criteria based on tachycardia, tachypnea with lactic acid of 3.8 on admission -Improved -C. difficile, GI PCR panel negative -Appreciate GI  Crohn's disease, without exacerbation -Continue Stelara, azathioprine, Pentasa  Hypotension -Continue midodrine.  Can titrate off as patient's blood pressure improves  Mental retardation -At baseline   In agreement with assessment of the pressure ulcer as below:  Pressure Injury 12/27/20 Coccyx Medial Stage 1 -  Intact skin with non-blanchable redness of a localized area usually over a bony prominence. (Active)  12/27/20 2300  Location: Coccyx  Location Orientation: Medial  Staging: Stage 1 -  Intact skin with non-blanchable redness of a localized area usually over a bony prominence.  Wound Description (Comments):   Present on Admission: Yes       Discharge Instructions  Discharge Instructions    Discharge instructions   Complete by: As directed    You were cared for by a hospitalist during your hospital stay. If you have any questions about your discharge medications or the care you received while you were in the hospital after you are discharged, you can call the unit and ask to speak with the hospitalist on call if the hospitalist that took care of you is not available. Once you are discharged, your primary care physician will handle any further medical issues.  Please note that NO REFILLS for any discharge medications will be authorized once you are discharged, as it is imperative that you return to your  primary care physician (or establish a relationship with a primary care physician if you do not have one) for your aftercare needs so that they can reassess your need for medications and monitor your lab values.   Increase activity slowly   Complete by: As directed    No wound care   Complete by: As directed      Allergies as of 12/30/2020      Reactions   Darvon [propoxyphene] Other (See Comments)   Unknown reaction   Other Other (See Comments)   Ask      Medication List    STOP taking these medications   furosemide 20 MG tablet Commonly known as: LASIX   lisinopril 5 MG tablet Commonly known as: ZESTRIL     TAKE these medications   acetaminophen 500 MG tablet Commonly known as: TYLENOL Take 500 mg by mouth at bedtime.   acetaminophen 325 MG tablet Commonly known as: TYLENOL Take 325 mg by mouth every 6 (six) hours as needed.   azaTHIOprine 50 MG tablet Commonly known as: IMURAN Take 1 tablet (50 mg total) by mouth daily.   cetaphil lotion Apply 1 application topically at bedtime. Apply to arms,legs,torso topically at bedtime for dry skin   famotidine 20 MG tablet Commonly known as: PEPCID Take 20 mg by mouth 2 (two) times daily.   ferrous sulfate 325 (65 FE) MG tablet Take 325 mg by mouth daily with breakfast.   ipratropium 0.03 % nasal spray Commonly known as: ATROVENT Place 1 spray into both nostrils 2 (two) times daily.   levETIRAcetam 500 MG tablet Commonly known as: KEPPRA Take 500 mg by mouth 2 (two) times daily.   loratadine 10 MG tablet Commonly known as: CLARITIN Take 10 mg by mouth daily.   mesalamine 500 MG CR capsule Commonly known as: PENTASA Take 1,000 mg by mouth 4 (four) times daily. Opened in applesauce   midodrine 5 MG tablet Commonly known as: PROAMATINE Take 1 tablet (5 mg total) by mouth 3 (three) times daily with meals.   multivitamins with iron Tabs tablet Take 1 tablet by mouth daily.   nystatin ointment Commonly known  as: MYCOSTATIN Apply 1 application topically 3 (three) times daily.   primidone 250 MG tablet Commonly known as: MYSOLINE Take 1 tab in AM, 2 tabs in PM What changed:   how much to take  how to take this  when to take this  additional instructions   selenium sulfide 1 % Lotn Commonly known as: SELSUN Apply 1 application topically See admin instructions. Apply to beard/eyebrows topically every evening shift every Mon, Wed,Fri for dandruff   Stelara 90 MG/ML Sosy injection Generic drug: ustekinumab Inject 45 mg into the skin every 8 (eight) weeks. Starting on 01-07-21   tamsulosin 0.4 MG Caps capsule Commonly known as: FLOMAX Take 1 capsule (0.4 mg total) by mouth daily.   triamcinolone 0.1 % Commonly known as: KENALOG Apply 1 application topically daily. Apply to face after shaving   VITAMIN B-12 IJ Inject 1 mL as directed every 30 (thirty) days.   Vitamin D (Ergocalciferol) 1.25 MG (50000 UNIT) Caps capsule Commonly known as: DRISDOL Take 50,000 Units by mouth every 30 (thirty) days. Due 01-04-21       Follow-up Information    Leonard Downing, MD. Schedule an appointment as soon as possible  for a visit in 1 week(s).   Specialty: Family Medicine Contact information: [place] [address] [phone]        Jerene Bears, MD Follow up.   Specialty: Gastroenterology Contact information: [address] [address] [phone]              Allergies  Allergen Reactions  . Darvon [Propoxyphene] Other (See Comments)    Unknown reaction  . Other Other (See Comments)    Ask      Procedures/Studies: US RENAL  Result Date: 12/27/2020 CLINICAL DATA:  Acute renal injury EXAM: RENAL / URINARY TRACT ULTRASOUND COMPLETE COMPARISON:  04/23/2018 FINDINGS: Right Kidney: Renal measurements: 9.2 x 4.6 x 4.0 cm. = volume: 87 mL. Diffuse increased echogenicity is noted. No mass lesion or hydronephrosis is seen. Left Kidney:  Renal measurements: 10.8 x 5.4 x 5.7 cm. = volume: 173 mL. No mass lesion or hydronephrosis is noted. Bladder: Decompressed Other: Fluid-filled bowel is noted just superior to the bladder. IMPRESSION: No mass lesion or hydronephrosis is noted. Increased echogenicity in the right kidney is noted consistent with medical renal disease. The echo pattern of the left kidney is incompletely evaluated. Electronically Signed   By: Inez Catalina M.D.   On: 12/27/2020 19:52   DG Chest Port 1 View  Result Date: 12/27/2020 CLINICAL DATA:  Questionable sepsis EXAM: PORTABLE CHEST 1 VIEW COMPARISON:  July 2019 FINDINGS: Low lung volumes. No consolidation or edema. No significant pleural effusion. No pneumothorax. Similar cardiomediastinal contours. IMPRESSION: No acute process in the chest. Electronically Signed   By: Macy Mis M.D.   On: 12/27/2020 12:49   ECHOCARDIOGRAM COMPLETE  Result Date: 12/29/2020    ECHOCARDIOGRAM REPORT   Patient Name:   KEMO SPRUCE [facility] Date of Exam: 12/29/2020 Medical Rec #:  [id number]        Height:       69.0 in Accession #:    [id number]       Weight:       220.7 lb Date of Birth:  Nov 27, 1955         BSA:          2.154 m Patient Age:    38 years         BP:           114/53 mmHg Patient Gender: M                HR:           80 bpm. Exam Location:  Inpatient Procedure: 2D Echo, Cardiac Doppler, Color Doppler and Intracardiac            Opacification Agent Indications:     CHF-Acute diastolic  History:         Patient has prior history of Echocardiogram examinations, most                  recent 01/18/2016. Aortic Valve Disease; Risk                  Factors:Hypertension.  Sonographer:     Clayton Lefort RDCS (AE) Referring Phys:  [id number] Shaune Pollack PEEPLES Diagnosing Phys: Vernell Leep MD IMPRESSIONS  1. Left ventricular ejection fraction, by estimation, is 65 to 70%. The left ventricle has normal function. The left ventricle has no regional wall motion abnormalities. There is mild left  ventricular hypertrophy. Left ventricular diastolic parameters are consistent with Grade II diastolic dysfunction (pseudonormalization). Elevated left atrial  pressure.  2. Right ventricular systolic function is normal. The right ventricular size is normal.  3. The mitral valve is grossly normal. No evidence of mitral valve regurgitation.  4. The aortic valve is tricuspid. Aortic valve regurgitation is mild to moderate. Aortic valve area, by VTI measures 1.45 cm. Aortic valve mean gradient measures 14.5 mmHg.  5. Aortic dilatation noted. There is mild dilatation of the ascending aorta, measuring 43 mm.  6. The inferior vena cava is normal in size with greater than 50% respiratory variability, suggesting right atrial pressure of 3 mmHg.  7. Compared to previous studies, dilated proxiumal ascending aorta isa a new finding. Recommend outpatient clinical surveillence. FINDINGS  Left Ventricle: Left ventricular ejection fraction, by estimation, is 65 to 70%. The left ventricle has normal function. The left ventricle has no regional wall motion abnormalities. Definity contrast agent was given IV to delineate the left ventricular  endocardial borders. The left ventricular internal cavity size was normal in size. There is mild left ventricular hypertrophy. Left ventricular diastolic parameters are consistent with Grade II diastolic dysfunction (pseudonormalization). Elevated left atrial pressure. Right Ventricle: The right ventricular size is normal. No increase in right ventricular wall thickness. Right ventricular systolic function is normal. Left Atrium: Left atrial size was normal in size. Right Atrium: Right atrial size was normal in size. Pericardium: There is no evidence of pericardial effusion. Mitral Valve: The mitral valve is grossly normal. No evidence of mitral valve regurgitation. MV peak gradient, 4.4 mmHg. The mean mitral valve gradient is 2.0 mmHg. Tricuspid Valve: The tricuspid valve is grossly normal.  Tricuspid valve regurgitation is not demonstrated. Aortic Valve: The aortic valve is tricuspid. Aortic valve regurgitation is mild to moderate. Aortic regurgitation PHT measures 494 msec. Aortic valve mean gradient measures 14.5 mmHg. Aortic valve peak gradient measures 24.2 mmHg. Aortic valve area, by VTI measures 1.45 cm. Pulmonic Valve: The pulmonic valve was grossly normal. Pulmonic valve regurgitation is not visualized. Aorta: Aortic dilatation noted. There is mild dilatation of the ascending aorta, measuring 43 mm. Venous: The inferior vena cava is normal in size with greater than 50% respiratory variability, suggesting right atrial pressure of 3 mmHg. IAS/Shunts: No atrial level shunt detected by color flow Doppler.  LEFT VENTRICLE PLAX 2D LVIDd:         4.80 cm  Diastology LVIDs:         2.80 cm  LV e' medial:    6.85 cm/s LV PW:         1.50 cm  LV E/e' medial:  16.6 LV IVS:        1.30 cm  LV e' lateral:   6.74 cm/s LVOT diam:     2.30 cm  LV E/e' lateral: 16.9 LV SV:         84 LV SV Index:   39 LVOT Area:     4.15 cm  RIGHT VENTRICLE             IVC RV Basal diam:  2.60 cm     IVC diam: 0.90 cm RV S prime:     11.50 cm/s TAPSE (M-mode): 2.6 cm LEFT ATRIUM           Index       RIGHT ATRIUM           Index LA diam:      2.90 cm 1.35 cm/m  RA Area:     12.50 cm LA Vol (A2C): 28.1 ml 13.04 ml/m RA Volume:  25.20 ml  11.70 ml/m LA Vol (A4C): 51.2 ml 23.77 ml/m  AORTIC VALVE AV Area (Vmax):    1.61 cm AV Area (Vmean):   1.53 cm AV Area (VTI):     1.45 cm AV Vmax:           246.00 cm/s AV Vmean:          179.000 cm/s AV VTI:            0.584 m AV Peak Grad:      24.2 mmHg AV Mean Grad:      14.5 mmHg LVOT Vmax:         95.30 cm/s LVOT Vmean:        65.800 cm/s LVOT VTI:          0.203 m LVOT/AV VTI ratio: 0.35 AI PHT:            494 msec  AORTA Ao Root diam: 3.80 cm Ao Asc diam:  4.30 cm MITRAL VALVE MV Area (PHT): 4.21 cm     SHUNTS MV Area VTI:   2.53 cm     Systemic VTI:  0.20 m MV Peak grad:   4.4 mmHg     Systemic Diam: 2.30 cm MV Mean grad:  2.0 mmHg MV Vmax:       1.05 m/s MV Vmean:      69.2 cm/s MV Decel Time: 180 msec MV E velocity: 114.00 cm/s MV A velocity: 100.00 cm/s MV E/A ratio:  1.14 Manish Patwardhan MD Electronically signed by Vernell Leep MD Signature Date/Time: 12/29/2020/3:45:44 PM    Final        Discharge Exam: Vitals:   12/29/20 2104 12/30/20 0300  BP: (!) 105/42 (!) 99/52  Pulse: 83 83  Resp: 20 18  Temp: 99.9 F (37.7 C) 98 F (36.7 C)  SpO2: 91% 94%     General: Pt is alert, awake, not in acute distress Cardiovascular: RRR, S1/S2 +, no edema Respiratory: CTA bilaterally, no wheezing, no rhonchi, no respiratory distress, no conversational dyspnea  Abdominal: Soft, NT, ND, bowel sounds + Extremities: no edema, no cyanosis Psych: Normal mood and affect   The results of significant diagnostics from this hospitalization (including imaging, microbiology, ancillary and laboratory) are listed below for reference.     Microbiology: Recent Results (from the past 240 hour(s))  Blood Culture (routine x 2)     Status: None (Preliminary result)   Collection Time: 12/27/20 11:56 AM   Specimen: BLOOD LEFT HAND  Result Value Ref Range Status   Specimen Description BLOOD LEFT HAND  Final   Special Requests   Final    BOTTLES DRAWN AEROBIC ONLY Blood Culture results may not be optimal due to an inadequate volume of blood received in culture bottles   Culture   Final    NO GROWTH 2 DAYS Performed at [facility], [place] [address], [place], [place] [address]    Report Status PENDING  Incomplete  Resp Panel by RT-PCR (Flu A&B, Covid) Nasopharyngeal Swab     Status: None   Collection Time: 12/27/20 12:32 PM   Specimen: Nasopharyngeal Swab; Nasopharyngeal(NP) swabs in vial transport medium  Result Value Ref Range Status   SARS Coronavirus 2 by RT PCR NEGATIVE NEGATIVE Final    Comment: (NOTE) SARS-CoV-2 target nucleic acids are NOT DETECTED.  The  SARS-CoV-2 RNA is generally detectable in upper respiratory specimens during the acute phase of infection. The lowest concentration of SARS-CoV-2 viral copies this assay can  detect is 138 copies/mL. A negative result does not preclude SARS-Cov-2 infection and should not be used as the sole basis for treatment or other patient management decisions. A negative result may occur with  improper specimen collection/handling, submission of specimen other than nasopharyngeal swab, presence of viral mutation(s) within the areas targeted by this assay, and inadequate number of viral copies(<138 copies/mL). A negative result must be combined with clinical observations, patient history, and epidemiological information. The expected result is Negative.  Fact Sheet for Patients:  EntrepreneurPulse.com.au  Fact Sheet for Healthcare Providers:  IncredibleEmployment.be  This test is no t yet approved or cleared by the [place] FDA and  has been authorized for detection and/or diagnosis of SARS-CoV-2 by FDA under an Emergency Use Authorization (EUA). This EUA will remain  in effect (meaning this test can be used) for the duration of the COVID-19 declaration under Section 564(b)(1) of the Act, 21 U.S.C.section 360bbb-3(b)(1), unless the authorization is terminated  or revoked sooner.       Influenza A by PCR NEGATIVE NEGATIVE Final   Influenza B by PCR NEGATIVE NEGATIVE Final    Comment: (NOTE) The Xpert Xpress SARS-CoV-2/FLU/RSV plus assay is intended as an aid in the diagnosis of influenza from Nasopharyngeal swab specimens and should not be used as a sole basis for treatment. Nasal washings and aspirates are unacceptable for Xpert Xpress SARS-CoV-2/FLU/RSV testing.  Fact Sheet for Patients: EntrepreneurPulse.com.au  Fact Sheet for Healthcare Providers: IncredibleEmployment.be  This test is not yet approved or  cleared by the [place] FDA and has been authorized for detection and/or diagnosis of SARS-CoV-2 by FDA under an Emergency Use Authorization (EUA). This EUA will remain in effect (meaning this test can be used) for the duration of the COVID-19 declaration under Section 564(b)(1) of the Act, 21 U.S.C. section 360bbb-3(b)(1), unless the authorization is terminated or revoked.  Performed at [facility], [place] [address] [place], [place] [address]   Blood Culture (routine x 2)     Status: None (Preliminary result)   Collection Time: 12/27/20 12:39 PM   Specimen: BLOOD RIGHT FOREARM  Result Value Ref Range Status   Specimen Description BLOOD RIGHT FOREARM  Final   Special Requests   Final    BOTTLES DRAWN AEROBIC ONLY Blood Culture results may not be optimal due to an inadequate volume of blood received in culture bottles   Culture   Final    NO GROWTH 2 DAYS Performed at [facility], [place] [address], [place], [place] [address]    Report Status PENDING  Incomplete  Gastrointestinal Panel by PCR , Stool     Status: None   Collection Time: 12/28/20  6:15 AM   Specimen: STOOL  Result Value Ref Range Status   Campylobacter species NOT DETECTED NOT DETECTED Final   Plesimonas shigelloides NOT DETECTED NOT DETECTED Final   Salmonella species NOT DETECTED NOT DETECTED Final   Yersinia enterocolitica NOT DETECTED NOT DETECTED Final   Vibrio species NOT DETECTED NOT DETECTED Final   Vibrio cholerae NOT DETECTED NOT DETECTED Final   Enteroaggregative E coli (EAEC) NOT DETECTED NOT DETECTED Final   Enteropathogenic E coli (EPEC) NOT DETECTED NOT DETECTED Final   Enterotoxigenic E coli (ETEC) NOT DETECTED NOT DETECTED Final   Shiga like toxin producing E coli (STEC) NOT DETECTED NOT DETECTED Final   Shigella/Enteroinvasive E coli (EIEC) NOT DETECTED NOT DETECTED Final   Cryptosporidium NOT DETECTED NOT DETECTED Final   Cyclospora cayetanensis NOT DETECTED  NOT DETECTED Final    Entamoeba histolytica NOT DETECTED NOT DETECTED Final   Giardia lamblia NOT DETECTED NOT DETECTED Final   Adenovirus F40/41 NOT DETECTED NOT DETECTED Final   Astrovirus NOT DETECTED NOT DETECTED Final   Norovirus GI/GII NOT DETECTED NOT DETECTED Final   Rotavirus A NOT DETECTED NOT DETECTED Final   Sapovirus (I, II, IV, and V) NOT DETECTED NOT DETECTED Final    Comment: Performed at [facility], [address], [place], [place] [address]  C Difficile Quick Screen w PCR reflex     Status: None   Collection Time: 12/28/20  6:15 AM   Specimen: STOOL  Result Value Ref Range Status   C Diff antigen NEGATIVE NEGATIVE Final   C Diff toxin NEGATIVE NEGATIVE Final   C Diff interpretation No C. difficile detected.  Final    Comment: Performed at [facility], [place] [address] [place], [place] [address]  MRSA PCR Screening     Status: Abnormal   Collection Time: 12/28/20  4:36 PM   Specimen: Nasal Mucosa; Nasopharyngeal  Result Value Ref Range Status   MRSA by PCR POSITIVE (A) NEGATIVE Final    Comment:        The GeneXpert MRSA Assay (FDA approved for NASAL specimens only), is one component of a comprehensive MRSA colonization surveillance program. It is not intended to diagnose MRSA infection nor to guide or monitor treatment for MRSA infections. RESULT CALLED TO, READ BACK BY AND VERIFIED WITH: P CHAILENDRA RN 12/28/20 2051 JDW Performed at [place] [address], [place], [place] [address]      Labs: BNP (last 3 results) No results for input(s): BNP in the last 8760 hours. Basic Metabolic Panel: Recent Labs  Lab 12/27/20 1151 12/27/20 1650 12/28/20 0307 12/29/20 0254 12/30/20 0202  NA 138 138 136 139 141  K 4.8 4.5 4.0 3.0* 3.8  CL 107  --  99 102 106  CO2 11*  --  21* 27 27  GLUCOSE 98  --  111* 119* 110*  BUN 75*  --  79* 52* 35*  CREATININE 6.36*  --  5.50* 2.62* 1.69*  CALCIUM 7.3*  --  8.1* 7.6* 7.8*   Liver Function Tests: Recent Labs   Lab 12/27/20 1151 12/28/20 1550  AST 17  --   ALT 12  --   ALKPHOS 65  --   BILITOT 0.5  --   PROT 6.6  --   ALBUMIN 2.5* 2.3*   No results for input(s): LIPASE, AMYLASE in the last 168 hours. No results for input(s): AMMONIA in the last 168 hours. CBC: Recent Labs  Lab 12/27/20 1639 12/27/20 1650  WBC 9.4  --   NEUTROABS 7.1  --   HGB 11.5* 11.2*  HCT 35.7* 33.0*  MCV 103.8*  --   PLT 229  --    Cardiac Enzymes: Recent Labs  Lab 12/28/20 0307  CKTOTAL 401*   BNP: Invalid input(s): POCBNP CBG: Recent Labs  Lab 12/27/20 1152 12/27/20 2302  GLUCAP 116* 100*   D-Dimer No results for input(s): DDIMER in the last 72 hours. Hgb A1c No results for input(s): HGBA1C in the last 72 hours. Lipid Profile No results for input(s): CHOL, HDL, LDLCALC, TRIG, CHOLHDL, LDLDIRECT in the last 72 hours. Thyroid function studies No results for input(s): TSH, T4TOTAL, T3FREE, THYROIDAB in the last 72 hours.  Invalid input(s): FREET3 Anemia work up [employer]    12/29/20 [place]  785   Urinalysis    Component Value Date/Time   COLORURINE AMBER (A) 12/27/2020 1800   APPEARANCEUR HAZY (A) 12/27/2020 1800   LABSPEC 1.018 12/27/2020 1800   PHURINE 5.0 12/27/2020 1800   GLUCOSEU NEGATIVE 12/27/2020 1800   HGBUR SMALL (A) 12/27/2020 1800   BILIRUBINUR NEGATIVE 12/27/2020 1800   KETONESUR NEGATIVE 12/27/2020 1800   PROTEINUR 30 (A) 12/27/2020 1800   UROBILINOGEN 1.0 04/01/2014 1057   NITRITE NEGATIVE 12/27/2020 1800   LEUKOCYTESUR NEGATIVE 12/27/2020 1800   Sepsis Labs Invalid input(s): PROCALCITONIN,  WBC,  LACTICIDVEN Microbiology Recent Results (from the past 240 hour(s))  Blood Culture (routine x 2)     Status: None (Preliminary result)   Collection Time: 12/27/20 11:56 AM   Specimen: BLOOD LEFT HAND  Result Value Ref Range Status   Specimen Description BLOOD LEFT HAND  Final   Special Requests   Final    BOTTLES DRAWN AEROBIC ONLY Blood Culture  results may not be optimal due to an inadequate volume of blood received in culture bottles   Culture   Final    NO GROWTH 2 DAYS Performed at [facility], [place] [address] [place], [place] [address]    Report Status PENDING  Incomplete  Resp Panel by RT-PCR (Flu A&B, Covid) Nasopharyngeal Swab     Status: None   Collection Time: 12/27/20 12:32 PM   Specimen: Nasopharyngeal Swab; Nasopharyngeal(NP) swabs in vial transport medium  Result Value Ref Range Status   SARS Coronavirus 2 by RT PCR NEGATIVE NEGATIVE Final    Comment: (NOTE) SARS-CoV-2 target nucleic acids are NOT DETECTED.  The SARS-CoV-2 RNA is generally detectable in upper respiratory specimens during the acute phase of infection. The lowest concentration of SARS-CoV-2 viral copies this assay can detect is 138 copies/mL. A negative result does not preclude SARS-Cov-2 infection and should not be used as the sole basis for treatment or other patient management decisions. A negative result may occur with  improper specimen collection/handling, submission of specimen other than nasopharyngeal swab, presence of viral mutation(s) within the areas targeted by this assay, and inadequate number of viral copies(<138 copies/mL). A negative result must be combined with clinical observations, patient history, and epidemiological information. The expected result is Negative.  Fact Sheet for Patients:  EntrepreneurPulse.com.au  Fact Sheet for Healthcare Providers:  IncredibleEmployment.be  This test is no t yet approved or cleared by the [place] FDA and  has been authorized for detection and/or diagnosis of SARS-CoV-2 by FDA under an Emergency Use Authorization (EUA). This EUA will remain  in effect (meaning this test can be used) for the duration of the COVID-19 declaration under Section 564(b)(1) of the Act, 21 U.S.C.section 360bbb-3(b)(1), unless the authorization is terminated  or  revoked sooner.       Influenza A by PCR NEGATIVE NEGATIVE Final   Influenza B by PCR NEGATIVE NEGATIVE Final    Comment: (NOTE) The Xpert Xpress SARS-CoV-2/FLU/RSV plus assay is intended as an aid in the diagnosis of influenza from Nasopharyngeal swab specimens and should not be used as a sole basis for treatment. Nasal washings and aspirates are unacceptable for Xpert Xpress SARS-CoV-2/FLU/RSV testing.  Fact Sheet for Patients: EntrepreneurPulse.com.au  Fact Sheet for Healthcare Providers: IncredibleEmployment.be  This test is not yet approved or cleared by the [place] FDA and has been authorized for detection and/or diagnosis of SARS-CoV-2 by FDA under an Emergency Use Authorization (EUA). This EUA will remain in effect (meaning this test can be used)  for the duration of the COVID-19 declaration under Section 564(b)(1) of the Act, 21 U.S.C. section 360bbb-3(b)(1), unless the authorization is terminated or revoked.  Performed at [facility], [place] [address], [place], [place] [address]   Blood Culture (routine x 2)     Status: None (Preliminary result)   Collection Time: 12/27/20 12:39 PM   Specimen: BLOOD RIGHT FOREARM  Result Value Ref Range Status   Specimen Description BLOOD RIGHT FOREARM  Final   Special Requests   Final    BOTTLES DRAWN AEROBIC ONLY Blood Culture results may not be optimal due to an inadequate volume of blood received in culture bottles   Culture   Final    NO GROWTH 2 DAYS Performed at [facility], [place] [address], [place], [place] [address]    Report Status PENDING  Incomplete  Gastrointestinal Panel by PCR , Stool     Status: None   Collection Time: 12/28/20  6:15 AM   Specimen: STOOL  Result Value Ref Range Status   Campylobacter species NOT DETECTED NOT DETECTED Final   Plesimonas shigelloides NOT DETECTED NOT DETECTED Final   Salmonella species NOT DETECTED NOT DETECTED Final   Yersinia  enterocolitica NOT DETECTED NOT DETECTED Final   Vibrio species NOT DETECTED NOT DETECTED Final   Vibrio cholerae NOT DETECTED NOT DETECTED Final   Enteroaggregative E coli (EAEC) NOT DETECTED NOT DETECTED Final   Enteropathogenic E coli (EPEC) NOT DETECTED NOT DETECTED Final   Enterotoxigenic E coli (ETEC) NOT DETECTED NOT DETECTED Final   Shiga like toxin producing E coli (STEC) NOT DETECTED NOT DETECTED Final   Shigella/Enteroinvasive E coli (EIEC) NOT DETECTED NOT DETECTED Final   Cryptosporidium NOT DETECTED NOT DETECTED Final   Cyclospora cayetanensis NOT DETECTED NOT DETECTED Final   Entamoeba histolytica NOT DETECTED NOT DETECTED Final   Giardia lamblia NOT DETECTED NOT DETECTED Final   Adenovirus F40/41 NOT DETECTED NOT DETECTED Final   Astrovirus NOT DETECTED NOT DETECTED Final   Norovirus GI/GII NOT DETECTED NOT DETECTED Final   Rotavirus A NOT DETECTED NOT DETECTED Final   Sapovirus (I, II, IV, and V) NOT DETECTED NOT DETECTED Final    Comment: Performed at [facility], [address] [place], [place] [address]  C Difficile Quick Screen w PCR reflex     Status: None   Collection Time: 12/28/20  6:15 AM   Specimen: STOOL  Result Value Ref Range Status   C Diff antigen NEGATIVE NEGATIVE Final   C Diff toxin NEGATIVE NEGATIVE Final   C Diff interpretation No C. difficile detected.  Final    Comment: Performed at [facility], [place] [address], [place], [place] [address]  MRSA PCR Screening     Status: Abnormal   Collection Time: 12/28/20  4:36 PM   Specimen: Nasal Mucosa; Nasopharyngeal  Result Value Ref Range Status   MRSA by PCR POSITIVE (A) NEGATIVE Final    Comment:        The GeneXpert MRSA Assay (FDA approved for NASAL specimens only), is one component of a comprehensive MRSA colonization surveillance program. It is not intended to diagnose MRSA infection nor to guide or monitor treatment for MRSA infections. RESULT CALLED TO, READ BACK BY AND  VERIFIED WITH: P CHAILENDRA RN 12/28/20 2051 JDW Performed at [facility] [address], [place], [place] [address]      Patient was seen and examined on the day of discharge and was found  to be in stable condition. Time coordinating discharge: 35 minutes including assessment and coordination of care, as well as examination of the patient.   SIGNED:  Dessa Phi, DO [facility] 12/30/2020, 10:20 AM

## 2020-12-30 NOTE — TOC Transition Note (Addendum)
Transition of Care [facility]) - CM/SW Discharge Note   Patient Details  Name: [name] MRN: [id number] Date of Birth: December 15, 1955  Transition of Care [facility]) CM/SW Contact:  Angelita Ingles, RN Phone Number: [phone]  12/30/2020, 1:35 PM   Clinical Narrative:    Patient to be discharged back to [facility]. Covid test results are pending. SIster [name] has been updated. Facility has been made aware of covid test pending. Facility states that patient must arrive to facility no later that 2100. Bedside nurse has been updated. D/c packet is in the patients shadow chart. TOC will sign off.   1615 Covid test results still pending. Bedside nurse has been made aware of number for [facility] and for report. Patient must arrive at facility before 2100.   Final next level of care: Skilled Nursing Facility Barriers to Discharge: Other (comment) (awaiting results from Covid test)   Patient Goals and CMS Choice   CMS Medicare.gov Compare Post Acute Care list provided to:: Other (Comment Required) (to return to previous facility) Choice offered to / list presented to : NA  Discharge Placement              Patient chooses bed at: [facility], [place] Patient to be transferred to facility by: [facility] Name of family member notified: [name] sister [facility]) Patient and family notified of of transfer: 12/30/20  Discharge Plan and Services                DME Arranged: N/A DME Agency: NA       HH Arranged: NA HH Agency: NA        Social Determinants of Health (SDOH) Interventions     Readmission Risk Interventions No flowsheet data found.

## 2020-12-30 NOTE — TOC Progression Note (Signed)
Transition of Care [facility]) - Progression Note    Patient Details  Name: [name] MRN: [id number] Date of Birth: 25-Aug-1956  [facility]) CM/SW Contact  Angelita Ingles, RN Phone Number: [phone]  12/30/2020, 8:55 AM  Clinical Narrative:    Per message from MD patient will be ready to go back to SNF today. CM spoke with Olivia Mackie @ [facility] admissions. Patient will need covid test and the d/[place] summary will need to be sent via the hub. Message has been sent to MD. [facility] will continue to follow.         Expected Discharge Plan and Services                                                 Social Determinants of Health (SDOH) Interventions    Readmission Risk Interventions No flowsheet data found.

## 2020-12-31 LAB — BASIC METABOLIC PANEL
Anion gap: 9 (ref 5–15)
BUN: 21 mg/dL (ref 8–23)
CO2: 25 mmol/L (ref 22–32)
Calcium: 8 mg/dL — ABNORMAL LOW (ref 8.9–10.3)
Chloride: 105 mmol/L (ref 98–111)
Creatinine, Ser: 1.4 mg/dL — ABNORMAL HIGH (ref 0.61–1.24)
GFR, Estimated: 55 mL/min — ABNORMAL LOW (ref >60)
Glucose, Bld: 101 mg/dL — ABNORMAL HIGH (ref 70–99)
Potassium: 3.8 mmol/L (ref 3.5–5.1)
Sodium: 139 mmol/L (ref 135–145)

## 2020-12-31 NOTE — Progress Notes (Signed)
  PROGRESS NOTE  Discharge delayed due to pending Covid results.  Covid is negative.  Patient remained stable for discharge back to his facility today.  Patient was seen, he had no physical complaints today.    Dessa Phi, DO [facility] 12/31/2020, 11:05 AM  Available via Epic secure chat 7am-7pm After these hours, please refer to coverage provider listed on [email or web address]

## 2020-12-31 NOTE — Progress Notes (Signed)
Called [facility] and pt will be picked up around 2pm. Called nurse at [facility] and gave report.

## 2020-12-31 NOTE — Progress Notes (Addendum)
Called PTAR after COVID 19 results was released. PT[facility] took information about pt and scheduled pt pick up on 12/31/20 around noon due to high volume of pick up. Also, [facility] was not willing to accept pt after 9pm which was not possible so [facility]AR rescheduled pick up. Called [facility] and spoke to nurse for report and was informed that pt cannot arrive before 9pm, so pt will be dropped off on 12/31/20.

## 2021-01-01 LAB — CULTURE, BLOOD (ROUTINE X 2)
Culture: NO GROWTH
Culture: NO GROWTH

## 2021-01-03 ENCOUNTER — Telehealth: Payer: Self-pay

## 2021-01-03 NOTE — Telephone Encounter (Signed)
Is he having any seizures? If none, stay on same dose. Thanks

## 2021-01-03 NOTE — Telephone Encounter (Signed)
Nurse Aldona Lento from [facility] called with Pt keppra level is 2.7 he is taken 500 mg BID. Number to call back is [phone]

## 2021-01-04 NOTE — Telephone Encounter (Signed)
Spoke with pt nurse no seizures, they will keep him on same dose,

## 2021-01-07 ENCOUNTER — Telehealth: Payer: Self-pay

## 2021-01-07 NOTE — Telephone Encounter (Signed)
How is he doing? If he is doing fine, not lethargic, would not make any changes. Thanks

## 2021-01-07 NOTE — Telephone Encounter (Signed)
[name] from [facility] called asking if Dr Delice Lesch had gotten the primidone lab results they had faxed over and to see if you would like to make any changes to primidone

## 2021-01-10 NOTE — Telephone Encounter (Signed)
Spoke with nurse at [facility] If he is doing fine, not lethargic, would not make any changes.

## 2021-01-18 ENCOUNTER — Encounter: Payer: Self-pay | Admitting: *Deleted

## 2021-01-26 ENCOUNTER — Encounter: Payer: Self-pay | Admitting: Internal Medicine

## 2021-01-26 ENCOUNTER — Other Ambulatory Visit (INDEPENDENT_AMBULATORY_CARE_PROVIDER_SITE_OTHER): Payer: Medicare Other

## 2021-01-26 ENCOUNTER — Ambulatory Visit (INDEPENDENT_AMBULATORY_CARE_PROVIDER_SITE_OTHER): Payer: Medicare Other | Admitting: Internal Medicine

## 2021-01-26 VITALS — BP 118/70 | HR 65 | Ht 70.0 in | Wt 217.4 lb

## 2021-01-26 DIAGNOSIS — K50819 Crohn's disease of both small and large intestine with unspecified complications: Secondary | ICD-10-CM | POA: Diagnosis not present

## 2021-01-26 LAB — COMPREHENSIVE METABOLIC PANEL
ALT: 10 U/L (ref 0–53)
AST: 13 U/L (ref 0–37)
Albumin: 3.7 g/dL (ref 3.5–5.2)
Alkaline Phosphatase: 148 U/L — ABNORMAL HIGH (ref 39–117)
BUN: 17 mg/dL (ref 6–23)
CO2: 24 mEq/L (ref 19–32)
Calcium: 9.3 mg/dL (ref 8.4–10.5)
Chloride: 106 mEq/L (ref 96–112)
Creatinine, Ser: 1.29 mg/dL (ref 0.40–1.50)
GFR: 57.71 mL/min — ABNORMAL LOW (ref >60.00)
Glucose, Bld: 118 mg/dL — ABNORMAL HIGH (ref 70–99)
Potassium: 4.7 mEq/L (ref 3.5–5.1)
Sodium: 137 mEq/L (ref 135–145)
Total Bilirubin: 0.3 mg/dL (ref 0.2–1.2)
Total Protein: 7.9 g/dL (ref 6.0–8.3)

## 2021-01-26 LAB — CBC WITH DIFFERENTIAL/PLATELET
Basophils Absolute: 0.1 10*3/uL (ref 0.0–0.1)
Basophils Relative: 1.3 % (ref 0.0–3.0)
Eosinophils Absolute: 0 10*3/uL (ref 0.0–0.7)
Eosinophils Relative: 0.1 % (ref 0.0–5.0)
HCT: 38.2 % — ABNORMAL LOW (ref 39.0–52.0)
Hemoglobin: 12.8 g/dL — ABNORMAL LOW (ref 13.0–17.0)
Lymphocytes Relative: 15 % (ref 12.0–46.0)
Lymphs Abs: 0.9 10*3/uL (ref 0.7–4.0)
MCHC: 33.4 g/dL (ref 30.0–36.0)
MCV: 98 fl (ref 78.0–100.0)
Monocytes Absolute: 0.8 10*3/uL (ref 0.1–1.0)
Monocytes Relative: 12.9 % — ABNORMAL HIGH (ref 3.0–12.0)
Neutro Abs: 4.2 10*3/uL (ref 1.4–7.7)
Neutrophils Relative %: 70.7 % (ref 43.0–77.0)
Platelets: 224 10*3/uL (ref 150.0–400.0)
RBC: 3.9 Mil/uL — ABNORMAL LOW (ref 4.22–5.81)
RDW: 13.5 % (ref 11.5–15.5)
WBC: 5.9 10*3/uL (ref 4.0–10.5)

## 2021-01-26 NOTE — Progress Notes (Signed)
Subjective:    Patient ID: [name], male    DOB: [birth year], 65 y.o.   MRN: [id number]  HPI [name] is a 65 year old male with a history of ileocolonic fistulizing and stricturing Crohn's disease dating back to 1985, prior right hemicolectomy, history of LOA and left hemicolectomy, history of IDA, gallstones with prior cholecystectomy, history of ankylosing spondylitis, history of DVT who is seen in hospital follow-up.  He was last seen on 11/30/2020 but was hospitalized around 12/27/2020 with severe nausea, vomiting and diarrhea leading to profound volume depletion with AKI.  He is here today with his family member [name].  He reports that he had the acute onset of diarrhea progressing to nausea vomiting in early March.  He became more and more volume depleted and confused and was taken to the hospital where he was noted to have a significant acute kidney injury.  His creatinine was 6.3.  It improved with hydration though he was given midodrine to help support blood pressure and his Lasix and lisinopril were held.  Fortunately his GI symptoms have resolved and he has returned to [facility] living facility.  He reports that his nausea, vomiting and diarrhea have all completely resolved.  They question whether this was a toxin mediated type event such as food poisoning or if he had viral gastroenteritis.  His GI pathogen panel and C. difficile tests were negative.  He has resumed medications from a Crohn's perspective.  He is taking azathioprine 50 mg daily, Pentasa 1 g 4 times a day and continue Stelara.  His first injection was 01/07/2021.  He denies abdominal pain today.  Appetite is good.  He did move from the assisted living part of [facility] to the nursing facility because they closed the assisted living portion.  It is a bigger facility but he feels well cared for.  Review of Systems As per HPI, otherwise negative  Current Medications, Allergies, Past Medical History, Past Surgical History,  Family History and Social History were reviewed in [employer] record.     Objective:   Physical Exam BP 118/70   Pulse 65   Ht 5' 10"  (1.778 m)   Wt 217 lb 6.4 oz (98.6 kg)   SpO2 100%   BMI 31.19 kg/m  Gen: awake, alert, NAD HEENT: anicteric, op clear CV: RRR, no mrg Pulm: CTA b/l Abd: soft, NT/ND, +BS throughout Ext: no c/c/e Neuro: nonfocal  CMP     Component Value Date/Time   NA 137 01/26/2021 0941   K 4.7 01/26/2021 0941   CL 106 01/26/2021 0941   CO2 24 01/26/2021 0941   GLUCOSE 118 (H) 01/26/2021 0941   BUN 17 01/26/2021 0941   CREATININE 1.29 01/26/2021 0941   CREATININE 0.87 10/28/2014 1533   CALCIUM 9.3 01/26/2021 0941   PROT 7.9 01/26/2021 0941   ALBUMIN 3.7 01/26/2021 0941   AST 13 01/26/2021 0941   ALT 10 01/26/2021 0941   ALKPHOS 148 (H) 01/26/2021 0941   BILITOT 0.3 01/26/2021 0941   GFRNONAA 55 (L) 12/31/2020 0307   GFRAA >60 06/06/2018 0655        Assessment & Plan:  65 year old male with a history of ileocolonic fistulizing and stricturing Crohn's disease dating back to 1985, prior right hemicolectomy, history of LOA and left hemicolectomy, history of IDA, gallstones with prior cholecystectomy, history of ankylosing spondylitis, history of DVT who is seen in hospital follow-up.   1.  Acute viral gastroenteritis versus food poisoning --nausea, vomiting and diarrhea  have resolved.  This presentation was not felt to be secondary to active Crohn's disease.  2.  AKI --severe kidney injury which improved with hydration after nausea, vomiting and diarrhea.  We have repeated metabolic panel today and creatinine has improved to 1.29.  His creatinine was also 1.29 in October which is considered to be baseline.  3.  Crohn's ileocolitis with history of fistula and stricture --he is now on Stelara and low-dose azathioprine along with 5-ASA.  He has completed budesonide taper. --Continue Stelara --Continue azathioprine 50 mg  daily --Continue Pentasa 1 g QID  4. B12 def -- continue monthly IM B12  followup in aug or sept 2022  30 minutes total spent today including patient facing time, coordination of care, reviewing medical history/procedures/pertinent radiology studies, and documentation of the encounter.

## 2021-01-26 NOTE — Patient Instructions (Signed)
Your provider has requested that you go to the basement level for lab work before leaving today. Press "B" on the elevator. The lab is located at the first door on the left as you exit the elevator.  Continue current medications.  Please follow up in the office in August or September 2022.  If you are age 67 or older, your body mass index should be between 23-30. Your Body mass index is 31.19 kg/m. If this is out of the aforementioned range listed, please consider follow up with your Primary Care Provider.  Due to recent changes in healthcare laws, you may see the results of your imaging and laboratory studies on MyChart before your provider has had a chance to review them.  We understand that in some cases there may be results that are confusing or concerning to you. Not all laboratory results come back in the same time frame and the provider may be waiting for multiple results in order to interpret others.  Please give [place] 48 hours in order for your provider to thoroughly review all the results before contacting the office for clarification of your results.

## 2021-01-27 ENCOUNTER — Telehealth: Payer: Self-pay | Admitting: Internal Medicine

## 2021-01-27 NOTE — Telephone Encounter (Signed)
Pt's sister [name] called to inform [name] about pt's care team at nursing home. Pt's MD is Dr. Bevelyn Buckles and his nurse is [facility]. Pt just moved to [place]. The phone number is [phone]. [name] stated that [name] requested this information.

## 2021-01-27 NOTE — Telephone Encounter (Signed)
[name] please see information below.

## 2021-03-24 ENCOUNTER — Other Ambulatory Visit: Payer: Self-pay | Admitting: Internal Medicine

## 2021-03-24 DIAGNOSIS — M545 Low back pain, unspecified: Secondary | ICD-10-CM

## 2021-04-04 ENCOUNTER — Other Ambulatory Visit: Payer: Medicare Other

## 2021-04-05 ENCOUNTER — Other Ambulatory Visit: Payer: Self-pay

## 2021-04-05 ENCOUNTER — Ambulatory Visit
Admission: RE | Admit: 2021-04-05 | Discharge: 2021-04-05 | Disposition: A | Discharge status: Home / Self Care | Payer: Medicare Other | Source: Ambulatory Visit | Attending: Internal Medicine | Admitting: Internal Medicine

## 2021-04-05 DIAGNOSIS — M545 Low back pain, unspecified: Secondary | ICD-10-CM

## 2021-04-06 ENCOUNTER — Telehealth: Payer: Self-pay | Admitting: Physical Medicine and Rehabilitation

## 2021-04-06 NOTE — Telephone Encounter (Signed)
Scheduled for 6/28 at 0930.

## 2021-04-06 NOTE — Telephone Encounter (Signed)
Kelly adams from [facility] called about pt getting scheduled from referral. I told her referral was received and she will get a call back to get him scheduled.   CB [phone] -- ask for kelly

## 2021-04-06 NOTE — Telephone Encounter (Signed)
Referral on your desk from Dr. Philip Aspen for low back pain consult. MRI yesterday. Please advise.

## 2021-04-06 NOTE — Telephone Encounter (Signed)
See previous message

## 2021-04-19 ENCOUNTER — Telehealth: Payer: Medicare Other | Admitting: Neurology

## 2021-04-19 ENCOUNTER — Telehealth: Payer: Self-pay | Admitting: Physical Medicine and Rehabilitation

## 2021-04-19 ENCOUNTER — Encounter: Payer: Self-pay | Admitting: Physical Medicine and Rehabilitation

## 2021-04-19 ENCOUNTER — Ambulatory Visit (INDEPENDENT_AMBULATORY_CARE_PROVIDER_SITE_OTHER): Payer: Medicare Other | Admitting: Physical Medicine and Rehabilitation

## 2021-04-19 VITALS — BP 122/77 | HR 69

## 2021-04-19 DIAGNOSIS — G8929 Other chronic pain: Secondary | ICD-10-CM

## 2021-04-19 DIAGNOSIS — M5416 Radiculopathy, lumbar region: Secondary | ICD-10-CM | POA: Diagnosis not present

## 2021-04-19 DIAGNOSIS — M5116 Intervertebral disc disorders with radiculopathy, lumbar region: Secondary | ICD-10-CM | POA: Diagnosis not present

## 2021-04-19 DIAGNOSIS — M5441 Lumbago with sciatica, right side: Secondary | ICD-10-CM

## 2021-04-19 NOTE — Progress Notes (Signed)
[name] - 65 y.o. male MRN [id number]  Date of birth: January 15, 1956  Office Visit Note: Visit Date: 04/19/2021 PCP: Leonard Downing, MD Referred by: Leonard Downing, *  Subjective: Chief Complaint  Patient presents with   Lower Back - Pain   Right Leg - Pain   HPI: [name] is a 65 y.o. male who comes in today at the request of Dr. Leanna Battles for evaluation of chronic, worsening, and severe right sided lower back pain that radiates down right leg. Patient reports some chronic back pain but more recent exacerbated pain started 1.5 months ago while laying in bed where he made a very slight movement and did find that he had increasing symptoms suddenly. Patient reports pain worsens with prolonged standing and walking. Pt describes pain as dull sensation, rates 2 out of 10 but approaches 6 out of 10 with standing and ambulating. Patient reports some pain relief with Tylenol. Patient states he fell to ground due to excruciating right lower back and leg pain several weeks ago while attempting to get into car. Patient denies any injuries from the fall. Patient states he was walking without assistance before the fall but now reports he is using wheelchair due to increased pain. Patient states he is not able to attend activities and social functions due to pain. Patient's recent lumbar MRI from June 2022 exhibits moderately large right-sided paracentral extrusion at L4-5 with migration behind the L4 vertebral body and also impacting the lateral recess and potentially the L4 and/or L5 nerve roots. Patient denies focal weakness, numbness and tingling.   Patient's course is complicated by CHF, Crohn's Disease, mental disability, and seizure disorder. Patient is currently a resident at [facility] and requires skilled help with most activities of daily living.  Review of Systems  Musculoskeletal:  Positive for back pain and joint pain.  Neurological:  Negative for  tingling, sensory change and weakness.  All other systems reviewed and are negative. Otherwise per HPI.  Assessment & Plan: Visit Diagnoses:    ICD-10-CM   1. Intervertebral disc disorders with radiculopathy, lumbar region  M51.16     2. Chronic right-sided low back pain with right-sided sciatica  M54.41    G89.29     3. Lumbar radiculopathy  M54.16        Plan: Findings:  Chronic, worsening, and severe right-sided lumbar radiculopathy secondary to moderately large L4-5 right paracentral disc herniation and extrusion. Recent decline in mobility and functionality due to increased pain and fall several weeks ago. Clinical picture and imaging consistent with classic L5 pattern. Pt's MRI discussed today using images and spine model. Plan of care discussed with patient and we believe the next step is to perform a diagnostic and hopefully therapeutic L5-S1 interlaminar epidural steroid injection. No red flag symptoms noted today upon exam.  Depending on relief would consider transforaminal approach.  If he is just not getting much relief he would be a candidate for spine or neurosurgery evaluation and consultation.   Meds & Orders: No orders of the defined types were placed in this encounter.  No orders of the defined types were placed in this encounter.   Follow-up: Return in about 2 days (around 04/21/2021) for L5-S1 Interlaminar ESI.   Procedures: No procedures performed      Clinical History: MRI LUMBAR SPINE WITHOUT CONTRAST   TECHNIQUE: Multiplanar, multisequence MR imaging of the lumbar spine was performed. No intravenous contrast was administered.   COMPARISON:  Radiography  03/27/2018   FINDINGS: Segmentation:  5 lumbar type vertebrae   Alignment:  Mild dextroscoliosis.   Vertebrae: No evidence of fracture, discitis, or aggressive bone lesion.   Conus medullaris and cauda equina: Conus extends to the T12-L1 level. Conus and cauda equina appear normal.   Paraspinal and  other soft tissues: No evidence of mass or inflammation about the spine   Disc levels:   T12- L1: Bridging osteophyte   L1-L2: Bridging osteophyte   L2-L3: Prominent ventral spurring. Mild disc bulging with shallow central protrusion.   L3-L4: Prominent ventral spurring.  Mild disc narrowing and bulging.   L4-L5: Large right paracentral extrusion with superior migration nearly to the superior endplate of L4. Marked impingement on the right L4 and descending L5 nerve roots. Combined with facet spurring and ligamentous thickening there is high-grade spinal stenosis.   L5-S1:Disc narrowing and bulging with ligamentum flavum thickening. Bilateral subarticular recess effacement and S1 impingement. Moderate right foraminal narrowing mainly from disc height loss and ridging.   IMPRESSION: 1. Symptomatic finding is likely at L4-5 where there is a large superiorly migrating right paracentral extrusion causing marked compression of the right L4 and L5 nerve roots. Combined with background facet degeneration there is high-grade spinal stenosis. 2. L5-S1 degenerative bilateral subarticular recess impingement. Moderate right foraminal narrowing at this level.     Electronically Signed   By: Monte Fantasia M.D.   On: 04/05/2021 18:26   He reports that he has never smoked. He has never used smokeless tobacco. No results for input(s): HGBA1C, LABURIC in the last 8760 hours.  Objective:  VS:  HT:    WT:   BMI:     BP:122/77  HR:69bpm  TEMP: ( )  RESP:  Physical Exam Constitutional:      Appearance: Normal appearance.  HENT:     Head: Normocephalic and atraumatic.     Right Ear: Tympanic membrane normal.     Left Ear: Tympanic membrane normal.     Nose: Nose normal.     Mouth/Throat:     Mouth: Mucous membranes are moist.  Eyes:     Pupils: Pupils are equal, round, and reactive to light.  Cardiovascular:     Rate and Rhythm: Normal rate and regular rhythm.     Pulses:  Normal pulses.  Pulmonary:     Effort: Pulmonary effort is normal.  Abdominal:     General: There is no distension.  Musculoskeletal:        General: Tenderness present.     Cervical back: Normal range of motion and neck supple.     Right lower leg: Edema present.     Left lower leg: Edema present.     Comments: Patient is slow to rise from seated to standing position with assistance.  Patient has 5 out of 5 strength with manual muscle testing of the bilateral hip flexors knee extension and knee flexion and dorsiflexion and plantarflexion and EHL.  He has no pain upon palpation of greater trochanters. Sensation intact bilaterally. Gait slow and unsteady.   Skin:    General: Skin is warm and dry.     Capillary Refill: Capillary refill takes less than 2 seconds.  Neurological:     General: No focal deficit present.     Mental Status: He is alert and oriented to person, place, and time.     Sensory: No sensory deficit.     Motor: No weakness.     Gait: Gait abnormal.  Psychiatric:  Mood and Affect: Mood normal.    Ortho Exam  Imaging: No results found.  Past Medical/Family/Surgical/Social History: Medications & Allergies reviewed per EMR, new medications updated. Patient Active Problem List   Diagnosis Date Noted   Hypotension 12/30/2020   Pressure injury of skin 12/28/2020   Leg edema 07/30/2019   Cholecystitis with cholelithiasis 59/74/1638   Diastolic CHF, chronic ([place]) 04/20/2018   Catheter-associated urinary tract infection ([place]) 04/19/2018   Mental disability    S/P colostomy takedown 08/01/2016   Crohn's disease of both small and large intestine with fistula ([place])    AKI (acute kidney injury) ([place]) 12/01/2015   Sepsis ([place]) 12/01/2015   Localization-related idiopathic epilepsy and epileptic syndromes with seizures of localized onset, not intractable, without status epilepticus ([place]) 03/30/2015   Nonrheumatic aortic valve stenosis 03/31/2014   Nonrheumatic aortic  valve insufficiency 03/31/2014   Pseudopolyposis of colon ([place]) 01/03/2012   Crohn's disease ([place]) 12/19/2011   Personal history of immunosupression therapy 12/19/2011   ECZEMA 08/21/2008   CHOLELITHIASIS, ASYMPTOMATIC 01/29/2008   VITAMIN B12 DEFICIENCY 01/17/2008   Past Medical History:  Diagnosis Date   Abscess of anal and rectal regions    Allergic rhinitis    Aortic valve insufficiency    non-rheumatic    BPH (benign prostatic hyperplasia)    CHF (congestive heart failure) (H[facility])     chronic diastolic heart failure    Cholelithiasis    Crohn's disease ([place])    Dizziness and giddiness    DVT (deep venous thrombosis) ([place])    Dysphagia    Epilepsy ([place])    "petit mal; only at home" (6/9//2015)   Heart murmur    Hepatomegaly    History of blood transfusion ~ 1992   "related to my Crohn's"   History of immunosuppression therapy    History of stomach ulcers    Hyperplastic colon polyp    Hypokalemia    hx of    Iron deficiency anemia, unspecified    iron deficiency anemia    Malaise    Mental disability    Muscle weakness (generalized)    Other B-complex deficiencies    REGIONAL ENTERITIS, LARGE INTESTINE 10/05/2000   Qualifier: Diagnosis of  By: Jerral Ralph     S/P partial colectomy for Crohn's disease 12/01/2015   SBO (small bowel obstruction) ([place]) 12/01/2015   Sepsis ([place])    hx of    Severe protein-calorie malnutrition Altamease Oiler: less than 60% of standard weight) ([place])    Syncope 03/31/2014   Syncope and collapse 03/31/2014   "while driving"   Tremor    Unsteadiness on feet    Urticaria    Vitamin D deficiency    Family History  Problem Relation Age of Onset   Skin cancer Father    Colon cancer Mother    Dementia Mother    Seizures Neg Hx    Crohn's disease Neg Hx    Esophageal cancer Neg Hx    Rectal cancer Neg Hx    Stomach cancer Neg Hx    Past Surgical History:  Procedure Laterality Date   CHOLECYSTECTOMY N/A 06/06/2018   Procedure:  LAPAROSCOPIC CHOLECYSTECTOMY WITH INTRAOPERATIVE CHOLANGIOGRAM ERAS PATHWAY;  Surgeon: Alphonsa Overall, MD;  Location: [facility];  Service: General;  Laterality: N/A;   COLON SURGERY     Anastomosis 45cm from anal verge c/w deswecending colon anastomosis   COLONOSCOPY  2013, 2016   COLOSTOMY TAKEDOWN N/A 08/01/2016   Procedure: LAPAROSCOPIC COLOSTOMY REVERSAL;  Surgeon: Alphonsa Overall, MD;  Location: [facility];  Service: General;  Laterality: N/A;   HEMICOLECTOMY  1985   ileocectomy    HERNIA REPAIR     LAPAROSCOPY N/A 12/04/2015   Procedure: LAPAROSCOPY DIAGNOSTIC, LAPAROSCOPIC INTEROLYSIS OF ADHESIONS, HAND- ASSISTED SMALL BOWEL RESECTION, MOBILIZATION OF SPLENIC FLEXURE, LEFT COLECTOMY WITH CREATION OF COLOSTOMY (HARTMAN'S PROCEDURE);  Surgeon: Alphonsa Overall, MD;  Location: [facility];  Service: General;  Laterality: N/A;   PILONIDAL CYST EXCISION  1980's   TONSILLECTOMY  ~ 2330   UMBILICAL HERNIA REPAIR  ~ 1983   Social History   Occupational History   Occupation: Disabled  Tobacco Use   Smoking status: Never   Smokeless tobacco: Never  Vaping Use   Vaping Use: Never used  Substance and Sexual Activity   Alcohol use: No    Alcohol/week: 0.0 standard drinks   Drug use: No   Sexual activity: Never

## 2021-04-19 NOTE — Progress Notes (Signed)
Right sided low back pain which radiates to outer and posterior thigh/ knee. Radiates down outside of lower leg. No numbness or tingling. Has had a few falls recently. Numeric Pain Rating Scale and Functional Assessment Average Pain 6 Pain Right Now 2 My pain is constant, dull, and aching Pain is worse with: walking, standing, and some pain at night while sleeping Pain improves with: rest   In the last MONTH (on 0-10 scale) has pain interfered with the following?  1. General activity like being  able to carry out your everyday physical activities such as walking, climbing stairs, carrying groceries, or moving a chair?  Rating(2)  2. Relation with others like being able to carry out your usual social activities and roles such as  activities at home, at work and in your community. Rating(2)  3. Enjoyment of life such that you have  been bothered by emotional problems such as feeling anxious, depressed or irritable?  Rating(0)

## 2021-04-19 NOTE — Telephone Encounter (Signed)
Is auth needed for L5-S1 IL? Scheduled for 6/30.

## 2021-04-19 NOTE — Telephone Encounter (Signed)
Not req Auth#

## 2021-04-20 ENCOUNTER — Encounter: Payer: Self-pay | Admitting: Physical Medicine and Rehabilitation

## 2021-04-21 ENCOUNTER — Encounter: Payer: Self-pay | Admitting: Physical Medicine and Rehabilitation

## 2021-04-21 ENCOUNTER — Ambulatory Visit: Payer: Self-pay

## 2021-04-21 ENCOUNTER — Other Ambulatory Visit: Payer: Self-pay

## 2021-04-21 ENCOUNTER — Ambulatory Visit (INDEPENDENT_AMBULATORY_CARE_PROVIDER_SITE_OTHER): Payer: Medicare Other | Admitting: Physical Medicine and Rehabilitation

## 2021-04-21 VITALS — BP 128/72 | HR 66

## 2021-04-21 DIAGNOSIS — M5416 Radiculopathy, lumbar region: Secondary | ICD-10-CM | POA: Diagnosis not present

## 2021-04-21 MED ORDER — BETAMETHASONE SOD PHOS & ACET 6 (3-3) MG/ML IJ SUSP
12.0000 mg | Freq: Once | INTRAMUSCULAR | Status: AC
  Administered 2021-04-21: 12 mg

## 2021-04-21 NOTE — Progress Notes (Signed)
Patient is still having pain in lower back. Patient ambulates in wheelchair. He is at [facility]. Takes Tylenol PRN.   Numeric Pain Rating Scale and Functional Assessment Average Pain 4   In the last MONTH (on 0-10 scale) has pain interfered with the following?  1. General activity like being  able to carry out your everyday physical activities such as walking, climbing stairs, carrying groceries, or moving a chair?    +Driver, -BT, -Dye Allergies.

## 2021-04-22 NOTE — Procedures (Signed)
Lumbar Epidural Steroid Injection - Interlaminar Approach with Fluoroscopic Guidance  Patient: [name]      Date of Birth: 06/26/1956 MRN: [id number] PCP: Leonard Downing, MD      Visit Date: 04/21/2021   Universal Protocol:     Consent Given By: the patient  Position: PRONE  Additional Comments: Vital signs were monitored before and after the procedure. Patient was prepped and draped in the usual sterile fashion. The correct patient, procedure, and site was verified.   Injection Procedure Details:   Procedure diagnoses: Lumbar radiculopathy [M54.16]   Meds Administered:  Meds ordered this encounter  Medications   betamethasone acetate-betamethasone sodium phosphate (CELESTONE) injection 12 mg     Laterality: Right  Location/Site:  L5-S1  Needle: 3.5 in., 20 ga. Tuohy  Needle Placement: Paramedian epidural  Findings:   -Comments: Excellent flow of contrast into the epidural space.  Procedure Details: Using a paramedian approach from the side mentioned above, the region overlying the inferior lamina was localized under fluoroscopic visualization and the soft tissues overlying this structure were infiltrated with 4 ml. of 1% Lidocaine without Epinephrine. The Tuohy needle was inserted into the epidural space using a paramedian approach.   The epidural space was localized using loss of resistance along with counter oblique bi-planar fluoroscopic views.  After negative aspirate for air, blood, and CSF, a 2 ml. volume of Isovue-250 was injected into the epidural space and the flow of contrast was observed. Radiographs were obtained for documentation purposes.    The injectate was administered into the level noted above.   Additional Comments:  The patient tolerated the procedure well Dressing: 2 x 2 sterile gauze and Band-Aid    Post-procedure details: Patient was observed during the procedure. Post-procedure instructions were reviewed.  Patient left  the clinic in stable condition.

## 2021-04-22 NOTE — Progress Notes (Signed)
[name] - 65 y.o. male MRN [id number]  Date of birth: 17-Jun-1956  Office Visit Note: Visit Date: 04/21/2021 PCP: Leonard Downing, MD Referred by: Leonard Downing, *  Subjective: Chief Complaint  Patient presents with   Lower Back - Pain   HPI:  [name] is a 65 y.o. male who comes in today for planned Right L5-S1 Lumbar Interlaminar epidural steroid injection with fluoroscopic guidance.  The patient has failed conservative care including home exercise, medications, time and activity modification.  This injection will be diagnostic and hopefully therapeutic.  Please see requesting physician notes for further details and justification. MRI reviewed with images and spine model.  MRI reviewed in the note below.     ROS Otherwise per HPI.  Assessment & Plan: Visit Diagnoses:    ICD-10-CM   1. Lumbar radiculopathy  M54.16 XR C-ARM NO REPORT    Epidural Steroid injection    betamethasone acetate-betamethasone sodium phosphate (CELESTONE) injection 12 mg      Plan: No additional findings.   Meds & Orders:  Meds ordered this encounter  Medications   betamethasone acetate-betamethasone sodium phosphate (CELESTONE) injection 12 mg    Orders Placed This Encounter  Procedures   XR C-ARM NO REPORT   Epidural Steroid injection    Follow-up: Return for Consider transforaminal epidural injection.   Procedures: No procedures performed  Lumbar Epidural Steroid Injection - Interlaminar Approach with Fluoroscopic Guidance  Patient: [name] NO      Date of Birth: 11/02/1955 MRN: [id number] PCP: Leonard Downing, MD      Visit Date: 04/21/2021   Universal Protocol:     Consent Given By: the patient  Position: PRONE  Additional Comments: Vital signs were monitored before and after the procedure. Patient was prepped and draped in the usual sterile fashion. The correct patient, procedure, and site was verified.   Injection Procedure Details:    Procedure diagnoses: Lumbar radiculopathy [M54.16]   Meds Administered:  Meds ordered this encounter  Medications   betamethasone acetate-betamethasone sodium phosphate (CELESTONE) injection 12 mg     Laterality: Right  Location/Site:  L5-S1  Needle: 3.5 in., 20 ga. Tuohy  Needle Placement: Paramedian epidural  Findings:   -Comments: Excellent flow of contrast into the epidural space.  Procedure Details: Using a paramedian approach from the side mentioned above, the region overlying the inferior lamina was localized under fluoroscopic visualization and the soft tissues overlying this structure were infiltrated with 4 ml. of 1% Lidocaine without Epinephrine. The Tuohy needle was inserted into the epidural space using a paramedian approach.   The epidural space was localized using loss of resistance along with counter oblique bi-planar fluoroscopic views.  After negative aspirate for air, blood, and CSF, a 2 ml. volume of Isovue-250 was injected into the epidural space and the flow of contrast was observed. Radiographs were obtained for documentation purposes.    The injectate was administered into the level noted above.   Additional Comments:  The patient tolerated the procedure well Dressing: 2 x 2 sterile gauze and Band-Aid    Post-procedure details: Patient was observed during the procedure. Post-procedure instructions were reviewed.  Patient left the clinic in stable condition.   Clinical History: MRI LUMBAR SPINE WITHOUT CONTRAST   TECHNIQUE: Multiplanar, multisequence MR imaging of the lumbar spine was performed. No intravenous contrast was administered.   COMPARISON:  Radiography 03/27/2018   FINDINGS: Segmentation:  5 lumbar type vertebrae   Alignment:  Mild dextroscoliosis.  Vertebrae: No evidence of fracture, discitis, or aggressive bone lesion.   Conus medullaris and cauda equina: Conus extends to the T12-L1 level. Conus and cauda equina appear  normal.   Paraspinal and other soft tissues: No evidence of mass or inflammation about the spine   Disc levels:   T12- L1: Bridging osteophyte   L1-L2: Bridging osteophyte   L2-L3: Prominent ventral spurring. Mild disc bulging with shallow central protrusion.   L3-L4: Prominent ventral spurring.  Mild disc narrowing and bulging.   L4-L5: Large right paracentral extrusion with superior migration nearly to the superior endplate of L4. Marked impingement on the right L4 and descending L5 nerve roots. Combined with facet spurring and ligamentous thickening there is high-grade spinal stenosis.   L5-S1:Disc narrowing and bulging with ligamentum flavum thickening. Bilateral subarticular recess effacement and S1 impingement. Moderate right foraminal narrowing mainly from disc height loss and ridging.   IMPRESSION: 1. Symptomatic finding is likely at L4-5 where there is a large superiorly migrating right paracentral extrusion causing marked compression of the right L4 and L5 nerve roots. Combined with background facet degeneration there is high-grade spinal stenosis. 2. L5-S1 degenerative bilateral subarticular recess impingement. Moderate right foraminal narrowing at this level.     Electronically Signed   By: Monte Fantasia M.D.   On: 04/05/2021 18:26     Objective:  VS:  HT:    WT:   BMI:     BP:128/72  HR:66bpm  TEMP: ( )  RESP:  Physical Exam Vitals and nursing note reviewed.  Constitutional:      General: He is not in acute distress.    Appearance: Normal appearance. He is not ill-appearing.  HENT:     Head: Normocephalic and atraumatic.     Right Ear: External ear normal.     Left Ear: External ear normal.     Nose: No congestion.  Eyes:     Extraocular Movements: Extraocular movements intact.  Cardiovascular:     Rate and Rhythm: Normal rate.     Pulses: Normal pulses.  Pulmonary:     Effort: Pulmonary effort is normal. No respiratory distress.   Abdominal:     General: There is no distension.     Palpations: Abdomen is soft.  Musculoskeletal:        General: No tenderness or signs of injury.     Cervical back: Neck supple.     Right lower leg: No edema.     Left lower leg: No edema.     Comments: Patient has good distal strength without clonus.  Skin:    Findings: No erythema or rash.  Neurological:     General: No focal deficit present.     Mental Status: He is alert and oriented to person, place, and time.     Sensory: No sensory deficit.     Motor: No weakness or abnormal muscle tone.     Coordination: Coordination normal.  Psychiatric:        Mood and Affect: Mood normal.        Behavior: Behavior normal.     Imaging: XR C-ARM NO REPORT  Result Date: 04/21/2021 Please see Notes tab for imaging impression.

## 2021-06-07 ENCOUNTER — Other Ambulatory Visit (INDEPENDENT_AMBULATORY_CARE_PROVIDER_SITE_OTHER): Payer: Medicare Other

## 2021-06-07 ENCOUNTER — Ambulatory Visit (INDEPENDENT_AMBULATORY_CARE_PROVIDER_SITE_OTHER): Payer: Medicare Other | Admitting: Internal Medicine

## 2021-06-07 ENCOUNTER — Encounter: Payer: Self-pay | Admitting: Internal Medicine

## 2021-06-07 VITALS — BP 124/58 | HR 80 | Ht 67.75 in | Wt 225.1 lb

## 2021-06-07 DIAGNOSIS — K50819 Crohn's disease of both small and large intestine with unspecified complications: Secondary | ICD-10-CM

## 2021-06-07 DIAGNOSIS — D509 Iron deficiency anemia, unspecified: Secondary | ICD-10-CM

## 2021-06-07 DIAGNOSIS — E538 Deficiency of other specified B group vitamins: Secondary | ICD-10-CM | POA: Diagnosis not present

## 2021-06-07 LAB — CBC WITH DIFFERENTIAL/PLATELET
Basophils Absolute: 0 10*3/uL (ref 0.0–0.1)
Basophils Relative: 0.8 % (ref 0.0–3.0)
Eosinophils Absolute: 0 10*3/uL (ref 0.0–0.7)
Eosinophils Relative: 0 % (ref 0.0–5.0)
HCT: 38.8 % — ABNORMAL LOW (ref 39.0–52.0)
Hemoglobin: 13 g/dL (ref 13.0–17.0)
Lymphocytes Relative: 13.1 % (ref 12.0–46.0)
Lymphs Abs: 0.8 10*3/uL (ref 0.7–4.0)
MCHC: 33.5 g/dL (ref 30.0–36.0)
MCV: 97.7 fl (ref 78.0–100.0)
Monocytes Absolute: 0.8 10*3/uL (ref 0.1–1.0)
Monocytes Relative: 14 % — ABNORMAL HIGH (ref 3.0–12.0)
Neutro Abs: 4.3 10*3/uL (ref 1.4–7.7)
Neutrophils Relative %: 72.1 % (ref 43.0–77.0)
Platelets: 279 10*3/uL (ref 150.0–400.0)
RBC: 3.97 Mil/uL — ABNORMAL LOW (ref 4.22–5.81)
RDW: 12.7 % (ref 11.5–15.5)
WBC: 6 10*3/uL (ref 4.0–10.5)

## 2021-06-07 LAB — COMPREHENSIVE METABOLIC PANEL
ALT: 9 U/L (ref 0–53)
AST: 15 U/L (ref 0–37)
Albumin: 3.7 g/dL (ref 3.5–5.2)
Alkaline Phosphatase: 129 U/L — ABNORMAL HIGH (ref 39–117)
BUN: 17 mg/dL (ref 6–23)
CO2: 28 mEq/L (ref 19–32)
Calcium: 9.1 mg/dL (ref 8.4–10.5)
Chloride: 101 mEq/L (ref 96–112)
Creatinine, Ser: 1.43 mg/dL (ref 0.40–1.50)
GFR: 50.87 mL/min — ABNORMAL LOW (ref >60.00)
Glucose, Bld: 103 mg/dL — ABNORMAL HIGH (ref 70–99)
Potassium: 4.8 mEq/L (ref 3.5–5.1)
Sodium: 135 mEq/L (ref 135–145)
Total Bilirubin: 0.2 mg/dL (ref 0.2–1.2)
Total Protein: 7.3 g/dL (ref 6.0–8.3)

## 2021-06-07 NOTE — Progress Notes (Signed)
   Subjective:    Patient ID: [name], male    DOB: [birth year], 65 y.o.   MRN: [id number]  HPI [name] is a 65 year old male with history of ileocolonic fistulizing and stricturing Crohn's disease dating back to 1995, prior right hemicolectomy, history of LOA and left hemicolectomy, history of IDA, gallstones with prior cholecystectomy, history of ankylosing spondylitis, prior DVT who is here for follow-up.  He was last seen on 01/26/2021.  He is here today with his family member [name].  He is feeling and doing very well.  He will be due his next Stelara injection in 2 weeks.  He continues his B12.  He is taking low-dose azathioprine.  He is using Pentasa 1 g 4 times a day.  At times the Pentasa tablets are hard for him to swallow.  He is not having issues with heartburn, nausea or vomiting.  Good appetite.  Regular bowel movements without blood in stool or melena.  No diarrhea.   Review of Systems As per HPI, otherwise negative  Current Medications, Allergies, Past Medical History, Past Surgical History, Family History and Social History were reviewed in [employer] record.    Objective:   Physical Exam BP (!) 124/58 (BP Location: Left Arm, Patient Position: Sitting, Cuff Size: Normal)   Pulse 80   Ht 5' 7.75" (1.721 m) Comment: height measured without shoes  Wt 225 lb 2 oz (102.1 kg)   BMI 34.48 kg/m  Gen: awake, alert, NAD HEENT: anicteric CV: RRR, no mrg Pulm: CTA b/l Abd: soft, NT/ND, +BS throughout Ext: no c/c/e Neuro: nonfocal     Assessment & Plan:  65 year old male with history of ileocolonic fistulizing and stricturing Crohn's disease dating back to 1995, prior right hemicolectomy, history of LOA and left hemicolectomy, history of IDA, gallstones with prior cholecystectomy, history of ankylosing spondylitis, prior DVT who is here for follow-up.   Crohn's ileocolitis with history of fistula and stricture --on Stelara since Feb 2022 and  low-dose azathioprine.  He previously had antibody formation to Cimzia.  Currently in clinical remission --Continue Stelara injection every 8 weeks --Continue azathioprine 50 mg daily --Will discontinue Pentasa, this is likely not to be contributing significantly to the control of his IBD.  Stelara and azathioprine should suffice.  However if any symptoms recur or return after stopping Pentasa he is asked to notify me immediately this would include abdominal pain, diarrhea, blood in stool.  He voices understanding --CBC and CMP today  2.  B12 deficiency --continue monthly IM B12  3.  History of IDA --continue iron and monitoring CBC and iron counts  9 to 69-monthfollow-up, sooner if needed  20 minutes total spent today including patient facing time, coordination of care, reviewing medical history/procedures/pertinent radiology studies, and documentation of the encounter.

## 2021-06-07 NOTE — Patient Instructions (Signed)
Your provider has requested that you go to the basement level for lab work before leaving today. Press "B" on the elevator. The lab is located at the first door on the left as you exit the elevator.  Continue Stelara and Azathioprine at current dose.   Stop taking Pentasa.   Please follow up with [name] in 9-12 months.   Thank you for entrusting me with your care and for choosing [facility], Dr. Zenovia Jarred

## 2021-07-19 ENCOUNTER — Encounter: Payer: Self-pay | Admitting: Neurology

## 2021-07-19 ENCOUNTER — Telehealth (INDEPENDENT_AMBULATORY_CARE_PROVIDER_SITE_OTHER): Payer: Medicare Other | Admitting: Neurology

## 2021-07-19 ENCOUNTER — Other Ambulatory Visit: Payer: Self-pay

## 2021-07-19 VITALS — Ht 72.0 in | Wt 227.0 lb

## 2021-07-19 DIAGNOSIS — G40009 Localization-related (focal) (partial) idiopathic epilepsy and epileptic syndromes with seizures of localized onset, not intractable, without status epilepticus: Secondary | ICD-10-CM | POA: Diagnosis not present
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
## 2021-07-19 NOTE — Patient Instructions (Signed)
Good to see you! Continue all your medications. Follow-up in 1 year, call for any changes.

## 2021-08-11 ENCOUNTER — Other Ambulatory Visit: Payer: Self-pay

## 2021-08-11 ENCOUNTER — Telehealth: Payer: Self-pay | Admitting: Internal Medicine

## 2021-08-11 DIAGNOSIS — K50819 Crohn's disease of both small and large intestine with unspecified complications: Secondary | ICD-10-CM

## 2021-08-11 MED ORDER — STELARA 45 MG/0.5ML ~~LOC~~ SOSY: 45.0000 mg | PREFILLED_SYRINGE | SUBCUTANEOUS | 6 refills | Status: AC

## 2021-08-11 NOTE — Telephone Encounter (Signed)
Outpatient Medication Detail   Disp Refills Start End   ustekinumab (STELARA) 45 MG/0.5ML SOSY injection 0.5 mL 6 08/11/2021    Sig - Route: Inject 0.5 mLs (45 mg total) into the skin every 8 (eight) weeks. - Subcutaneous   Sent to pharmacy as: ustekinumab Delsa Grana) 45 MG/0.5ML Solution Prefilled Syringe injection   E-Prescribing Status: Receipt confirmed by pharmacy (08/11/2021 12:06 PM EDT)

## 2021-08-11 NOTE — Telephone Encounter (Signed)
Inbound call from [place] from [facility] stating they need a new prescription for Stelera due to it expiring today. He stated that it can't be shipped out until new prescription has been sent over. Best contact [phone]. Please advise. Thank you.

## 2021-08-16 NOTE — Telephone Encounter (Signed)
[name] with [facility] called requesting a refill of patient's Stelara.  Phone number:  [phone].  Thank you.

## 2021-08-16 NOTE — Telephone Encounter (Signed)
Spoke with pharmacy and they needed to confirm the address for [facility] for setting up shipment of the drug. Pt is not in [place].

## 2021-10-11 ENCOUNTER — Telehealth: Payer: Self-pay | Admitting: Internal Medicine

## 2021-10-11 NOTE — Telephone Encounter (Signed)
[name] (pt's POA) called regarding patient's Stelara shot.  She said the nursing facility where he's staying now give it to him every two months.  She was under the impression that he got the shot monthly.  Can you please call her and let her know the dosing for his Stelara?  Thank you.

## 2021-10-11 NOTE — Telephone Encounter (Addendum)
Returned call and told patient's POA that according to last office visit he should receive Stelara every 8 weeks. Cathy verbalized understanding and had no other concerns at end of call.

## 2021-12-19 ENCOUNTER — Ambulatory Visit (INDEPENDENT_AMBULATORY_CARE_PROVIDER_SITE_OTHER): Payer: Medicare Other | Admitting: Neurology

## 2021-12-19 ENCOUNTER — Telehealth: Payer: Self-pay | Admitting: Internal Medicine

## 2021-12-19 ENCOUNTER — Other Ambulatory Visit: Payer: Self-pay

## 2021-12-19 ENCOUNTER — Encounter: Payer: Self-pay | Admitting: Neurology

## 2021-12-19 VITALS — BP 121/72 | HR 89 | Ht 68.0 in | Wt 238.0 lb

## 2021-12-19 DIAGNOSIS — R251 Tremor, unspecified: Secondary | ICD-10-CM

## 2021-12-19 DIAGNOSIS — G40009 Localization-related (focal) (partial) idiopathic epilepsy and epileptic syndromes with seizures of localized onset, not intractable, without status epilepticus: Secondary | ICD-10-CM | POA: Diagnosis not present

## 2021-12-19 MED ORDER — LEVETIRACETAM 500 MG PO TABS: 500.0000 mg | ORAL_TABLET | Freq: Two times a day (BID) | ORAL | 11 refills | Status: DC

## 2021-12-19 NOTE — Telephone Encounter (Signed)
Patients sister called and  stated that she had received a letter in the mail from [employer] stating that he needed an prior authorization for Stelara.  Please advise.
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
## 2021-12-19 NOTE — Telephone Encounter (Signed)
Spoke with pts sister and she states pt is now at [facility] in [place] Phone number there is [phone]. Pts sister states that [facility] sent her a letter that the stelara needs prior auth due to it not be on formulary. Pt has already been on Stelera. She states [employer] told her they were faxing over paperwork to be filled out by [name]. Let her know we would be on the lookout for these and let her know if we do not see them in a few days.

## 2021-12-19 NOTE — Patient Instructions (Signed)
Always good to see you.  Take Keppra (levetiracetam) 526m 1 tablet twice a day  2. Continue Primidone 2560m take 1 tablet in AM, 2 tablets in PM  3. [facility]. Follow-up in 6 months, call for any changes   Seizure Precautions: 1. If medication has been prescribed for you to prevent seizures, take it exactly as directed.  Do not stop taking the medicine without talking to your doctor first, even if you have not had a seizure in a long time.   2. Avoid activities in which a seizure would cause danger to yourself or to others.  Don't operate dangerous machinery, swim alone, or climb in high or dangerous places, such as on ladders, roofs, or girders.  Do not drive unless your doctor says you may.  3. If you have any warning that you may have a seizure, lay down in a safe place where you can't hurt yourself.    4.  No driving for 6 months from last seizure, as per [facility]  Please refer to the following link on the [employer]Even[employer]adee[employer] for more information: http://www.epilepsyfoundation.org/answerplace/Social/driving/drivingu.cfm   5.  Maintain good sleep hygiene.  6.  Contact your doctor if you have any problems that may be related to the medicine you are taking.  7.  Call 911 and bring the patient back to the ED if:        A.  The seizure lasts longer than 5 minutes.       B.  The patient doesn't awaken shortly after the seizure  C.  The patient has new problems such as difficulty seeing, speaking or moving  D.  The patient was injured during the seizure  E.  The patient has a temperature over 102 F (39C)  F.  The patient vomited and now is having trouble breathing

## 2021-12-20 NOTE — Telephone Encounter (Signed)
Received fax from [employer] regarding PA for Stelara. Paper filled out and faxed along with records to obtain prior auth.

## 2021-12-21 NOTE — Telephone Encounter (Signed)
PA was obtained through August for pts Stelara. Spoke with pts sister and she is aware. ?

## 2022-02-27 ENCOUNTER — Telehealth: Payer: Self-pay | Admitting: Internal Medicine

## 2022-02-27 DIAGNOSIS — R197 Diarrhea, unspecified: Secondary | ICD-10-CM

## 2022-02-27 DIAGNOSIS — K50819 Crohn's disease of both small and large intestine with unspecified complications: Secondary | ICD-10-CM

## 2022-02-27 NOTE — Telephone Encounter (Signed)
Let's check his stool for inflammation and infection to start ?Fecal calprotectin ?C diff PCR ? ?

## 2022-02-27 NOTE — Telephone Encounter (Signed)
Inbound call from patients sister stating that patient had his last Stelara shot on April 27th  and has had diarrhea for the last two weeks. [name] is requesting a call back to discuss what they should do. Please advise.   ?

## 2022-02-27 NOTE — Telephone Encounter (Signed)
Pts sister called and states that [name] usually gets diarrhea a few days prior to his stelara and then it stops. HIs last injection was 4/27 and he is still having diarrhea and having to take Imodium. He is due for next shot 8 weeks from 4/27. Sister wants to know what [name] recommends. Please advise. ?

## 2022-02-28 ENCOUNTER — Other Ambulatory Visit: Payer: Medicare Other

## 2022-02-28 NOTE — Telephone Encounter (Signed)
Called and spoke with [facility] regarding Dr. Vena Rua recommendations. [name] states that she will have her cousin come pick up the stool kits and collection instructions for the patient. Cathy verbalized understanding and had no concerns at the end of the call.  ? ?Stool study orders in epic.  ?

## 2022-03-01 ENCOUNTER — Other Ambulatory Visit: Payer: Medicare Other

## 2022-03-01 DIAGNOSIS — K50819 Crohn's disease of both small and large intestine with unspecified complications: Secondary | ICD-10-CM

## 2022-03-01 DIAGNOSIS — R197 Diarrhea, unspecified: Secondary | ICD-10-CM

## 2022-03-03 ENCOUNTER — Telehealth: Payer: Self-pay | Admitting: Internal Medicine

## 2022-03-03 NOTE — Telephone Encounter (Signed)
The pt sister (DPR onfile) has been advised that stool test have not resulted.  We will call as soon as available.  The pt has been advised of the information and verbalized understanding.    ?

## 2022-03-03 NOTE — Telephone Encounter (Signed)
Inbound call from patients sister [name], requesting results from patients labs done on 5/10. Please advise.  ?

## 2022-03-04 LAB — CLOSTRIDIUM DIFFICILE BY PCR: Toxigenic C. Difficile by PCR: NEGATIVE

## 2022-03-08 LAB — CALPROTECTIN, FECAL: Calprotectin, Fecal: 101 ug/g (ref 0–120)

## 2022-03-10 ENCOUNTER — Other Ambulatory Visit: Payer: Self-pay

## 2022-03-10 ENCOUNTER — Telehealth: Payer: Self-pay | Admitting: Internal Medicine

## 2022-03-10 MED ORDER — BUDESONIDE 3 MG PO CPEP: ORAL_CAPSULE | ORAL | 0 refills | Status: DC

## 2022-03-10 NOTE — Telephone Encounter (Signed)
Patient's sister called regarding patient's medication.  She said the budesonide needed to be faxed to [facility], fax [phone], as a STAT order.  The pharmacy phone number is [phone].  Thank you.

## 2022-03-10 NOTE — Telephone Encounter (Signed)
Faxed as a stat order.

## 2022-03-17 ENCOUNTER — Ambulatory Visit: Payer: Medicare Other | Admitting: Neurology

## 2022-05-01 ENCOUNTER — Encounter: Payer: Self-pay | Admitting: Podiatry

## 2022-05-01 ENCOUNTER — Ambulatory Visit (INDEPENDENT_AMBULATORY_CARE_PROVIDER_SITE_OTHER): Payer: Medicare Other | Admitting: Podiatry

## 2022-05-01 DIAGNOSIS — M79675 Pain in left toe(s): Secondary | ICD-10-CM | POA: Diagnosis not present

## 2022-05-01 DIAGNOSIS — M79674 Pain in right toe(s): Secondary | ICD-10-CM

## 2022-05-01 DIAGNOSIS — B351 Tinea unguium: Secondary | ICD-10-CM

## 2022-05-01 NOTE — Progress Notes (Signed)
  Subjective:  Patient ID: [name], male    DOB: [birth year],  MRN: [id number]  Chief Complaint  Patient presents with   Nail Problem    (np) diabetic foot care-nail trim,corns and callus    66 y.o. male presents with the above complaint. History confirmed with patient.  Nails are thickened elongated and he is unable to cut them  Objective:  Physical Exam: warm, good capillary refill, no trophic changes or ulcerative lesions, normal DP and PT pulses, and normal sensory exam. Left Foot: dystrophic yellowed discolored nail plates with subungual debris Right Foot: dystrophic yellowed discolored nail plates with subungual debris  Assessment:   1. Pain due to onychomycosis of toenails of both feet      Plan:  Patient was evaluated and treated and all questions answered.  Discussed the etiology and treatment options for the condition in detail with the patient. Educated patient on the topical and oral treatment options for mycotic nails. Recommended debridement of the nails today. Sharp and mechanical debridement performed of all painful and mycotic nails today. Nails debrided in length and thickness using a nail nipper to level of comfort. Discussed treatment options including appropriate shoe gear. Follow up as needed for painful nails.    Return in about 3 months (around 08/01/2022) for nail care.

## 2022-05-02 ENCOUNTER — Ambulatory Visit: Payer: Medicare Other | Admitting: Internal Medicine

## 2022-05-10 ENCOUNTER — Other Ambulatory Visit: Payer: Self-pay

## 2022-05-10 ENCOUNTER — Emergency Department
Admission: EM | Admit: 2022-05-10 | Discharge: 2022-05-11 | Disposition: A | Discharge status: Skilled Nursing Facility | Payer: Medicare Other | Attending: Emergency Medicine | Admitting: Emergency Medicine

## 2022-05-10 DIAGNOSIS — G40909 Epilepsy, unspecified, not intractable, without status epilepticus: Secondary | ICD-10-CM | POA: Diagnosis present

## 2022-05-10 LAB — COMPREHENSIVE METABOLIC PANEL
ALT: 17 U/L (ref 0–44)
AST: 19 U/L (ref 15–41)
Albumin: 3.9 g/dL (ref 3.5–5.0)
Alkaline Phosphatase: 159 U/L — ABNORMAL HIGH (ref 38–126)
Anion gap: 4 — ABNORMAL LOW (ref 5–15)
BUN: 19 mg/dL (ref 8–23)
CO2: 26 mmol/L (ref 22–32)
Calcium: 8.8 mg/dL — ABNORMAL LOW (ref 8.9–10.3)
Chloride: 106 mmol/L (ref 98–111)
Creatinine, Ser: 1.28 mg/dL — ABNORMAL HIGH (ref 0.61–1.24)
GFR, Estimated: >60 mL/min (ref >60)
Glucose, Bld: 113 mg/dL — ABNORMAL HIGH (ref 70–99)
Potassium: 4.7 mmol/L (ref 3.5–5.1)
Sodium: 136 mmol/L (ref 135–145)
Total Bilirubin: 0.4 mg/dL (ref 0.3–1.2)
Total Protein: 7.7 g/dL (ref 6.5–8.1)

## 2022-05-10 NOTE — ED Triage Notes (Signed)
EMS brings pt in from [facility] for elevated primidone level

## 2022-05-10 NOTE — ED Notes (Signed)
Patient's POA called prior to arrival, he is not able to make medical decisions regarding his care please call Lamont Snowball [phone] for consent.

## 2022-05-10 NOTE — Discharge Instructions (Addendum)
Your primidone level will not be available for 24 to 48 hours from the ED.  You are advised to hold your seizure medicine for 1 dose and then continue as prescribed.  Follow with primary provider return to the ED if necessary.

## 2022-05-10 NOTE — ED Triage Notes (Signed)
Pt BIB ems from [facility] d/t elevated primidone level.  Pt has hx of seizures and takes keppra 549m daily, and primidone-2585min the am and 50012mvery PM.  Pt denies any recent seizures.  Pt denies any other complaints, and states he feels normal.  Pt is A&O x4 at this time in NAD in triage.

## 2022-05-11 ENCOUNTER — Telehealth: Payer: Self-pay | Admitting: Neurology

## 2022-05-11 NOTE — Telephone Encounter (Signed)
Called facility again not able to leave voicemail

## 2022-05-11 NOTE — ED Notes (Signed)
Pt states he is here due to abnormal labs. Pt states he feels fine. No signs of seizure activity noted at this time.

## 2022-05-11 NOTE — Telephone Encounter (Signed)
Called wife and patient is doing well. I will call facility next

## 2022-05-11 NOTE — Telephone Encounter (Signed)
Pt's sister called in stating the pt lives in an assisted living facility and had some lab work done that something was critical. They sent him to the ED at [facility] She wanted Dr. Delice Lesch to know and see if there are any recommendations

## 2022-05-11 NOTE — ED Notes (Signed)
Report given to [employer], [employer] at [facility]. Legal Guardian was notified of pt being discharged.

## 2022-05-11 NOTE — ED Provider Notes (Signed)
[facility] Emergency Department Provider Note     Event Date/Time   First MD Initiated Contact with Patient 05/10/22 2132     (approximate)   History   Abnormal Lab   HPI  [name] is a 66 y.o. male presents to the ED via EMS from his facility, [place]. he is without complaint, and denies any seizure activity. He reports he had routine labs drawn about 2 weeks ago. He was notified today, that his primidone level was elevated at 22 (20 ml/dl is normal).  He takes Keppra 500 mg QD and primidone 250 mg AM and 500 mg PM. He has not missed any doses and denies any other complaints.   Physical Exam   Triage Vital Signs: ED Triage Vitals  Enc Vitals Group     BP 05/10/22 2019 (!) 118/52     Pulse Rate 05/10/22 2019 65     Resp 05/10/22 2019 18     Temp 05/10/22 2019 97.9 F (36.6 C)     Temp Source 05/10/22 2019 Oral     SpO2 05/10/22 2019 97 %     Weight 05/10/22 2019 238 lb (108 kg)     Height 05/10/22 2019 5' 8"  (1.727 m)     Head Circumference --      Peak Flow --      Pain Score 05/10/22 2027 0     Pain Loc --      Pain Edu? --      Excl. in [place]? --     Most recent vital signs: Vitals:   05/10/22 2019 05/11/22 0005  BP: (!) 118/52 (!) 138/53  Pulse: 65 68  Resp: 18 18  Temp: 97.9 F (36.6 C) 97.6 F (36.4 C)  SpO2: 97% 100%    General Awake, no distress. NAD CV:  Good peripheral perfusion. RRR RESP:  Normal effort. CTA ABD:  No distention.   ED Results / Procedures / Treatments   Labs (all labs ordered are listed, but only abnormal results are displayed) Labs Reviewed  COMPREHENSIVE METABOLIC PANEL - Abnormal; Notable for the following components:      Result Value   Glucose, Bld 113 (*)    Creatinine, Ser 1.28 (*)    Calcium 8.8 (*)    Alkaline Phosphatase 159 (*)    Anion gap 4 (*)    All other components within normal limits  PRIMIDONE, SERUM     EKG    RADIOLOGY   No results  found.   PROCEDURES:  Critical Care performed: No  Procedures   MEDICATIONS ORDERED IN ED: Medications - No data to display   IMPRESSION / MDM / [place] / ED COURSE  I reviewed the triage vital signs and the nursing notes.                              Differential diagnosis includes, but is not limited to, seizure   Patient's presentation is most consistent with acute complicated illness / injury requiring diagnostic workup.  Patient's diagnosis is consistent with abnormal labs related to seizure medication levels. The serum level for his primidone will not be available for at least 48 hours as it is a send out lab. Patient is stable on exam without disability, electrolyte abnormality, or seizure activity. Patient is without complaint at this time. He is not sure why he has been sent to the ED. Patient  will be discharged home with instructions to hold a dose of his medicine. Patient is to follow up with his PCP as needed or otherwise directed. Patient is given ED precautions to return to the ED for any worsening or new symptoms.     FINAL CLINICAL IMPRESSION(S) / ED DIAGNOSES   Final diagnoses:  Seizure disorder ([place])     Rx / DC Orders   ED Discharge Orders     None        Note:  This document was prepared using Dragon voice recognition software and may include unintentional dictation errors.    Melvenia Needles, PA-C 05/11/22 2355    Rada Hay, MD 05/12/22 [phone]

## 2022-05-11 NOTE — Telephone Encounter (Signed)
Jasper to ask for a report was told to ask for Alwyn Ren  and [facility] [address] [phone]. Theis nurse phone had a full mailbox

## 2022-05-11 NOTE — Telephone Encounter (Signed)
Called Alwyn Ren again no voicemail available

## 2022-05-11 NOTE — Telephone Encounter (Signed)
Pls check with [name] how he is feeling. ER notes say he was sent there because his Primidone level was critical, but I don't see the result. The ER rechecked it but it is still pending. If he is doing fine, no sedation, dizziness, increased falls, would not worry. Pls contact his facility to ask for a copy of the critical level, it may also depend on when the bloodwork was done, if bloodwork was done soon after he took his Primidone, then it will be high.

## 2022-05-12 ENCOUNTER — Telehealth: Payer: Self-pay | Admitting: Neurology

## 2022-05-12 LAB — PRIMIDONE, SERUM
Phenobarbital: 32 ug/mL (ref 15–40)
Primidone, Serum: 19.1 ug/mL — ABNORMAL HIGH (ref 5.0–12.0)

## 2022-05-12 NOTE — Telephone Encounter (Signed)
Alwyn Ren from [facility] called to report the patient is doing fine after the recent ED visit.   See AVS and labs she faxed over yesterday under the media tab, if needed.

## 2022-05-12 NOTE — Telephone Encounter (Signed)
Pt's sister called in wanting to double check and see if we received the lab tests that wer faxed by [place]? The tests were taken yesterday.

## 2022-05-12 NOTE — Telephone Encounter (Signed)
Pls let them know that as long as he is not sedated, dizzy, unsteady, would not worry about level. Level will be high if he had just taken his medication earlier that morning. Levels depend on when it was taken and when he took his med.

## 2022-05-12 NOTE — Telephone Encounter (Signed)
See other phone note

## 2022-05-12 NOTE — Telephone Encounter (Signed)
Spoke with pt sister informed her as long as he is not sedated, dizzy, unsteady, would not worry about level. Level will be high if he had just taken his medication earlier that morning. Levels depend on when it was taken and when he took his med. She stated at this time they will just watch him ,

## 2022-05-15 ENCOUNTER — Telehealth: Payer: Self-pay | Admitting: Internal Medicine

## 2022-05-15 NOTE — Telephone Encounter (Signed)
Dr Hilarie Fredrickson the pt sister is asking if you would please ask the pt not to continue to drink sodas.  She says it causes him diarrhea.  He has an appt upcoming with you in the office.

## 2022-05-15 NOTE — Telephone Encounter (Signed)
Ok, noted I will discuss this with him later this week when I see him

## 2022-05-18 ENCOUNTER — Ambulatory Visit (INDEPENDENT_AMBULATORY_CARE_PROVIDER_SITE_OTHER): Payer: Medicare Other | Admitting: Internal Medicine

## 2022-05-18 ENCOUNTER — Other Ambulatory Visit (INDEPENDENT_AMBULATORY_CARE_PROVIDER_SITE_OTHER): Payer: Medicare Other

## 2022-05-18 ENCOUNTER — Encounter: Payer: Self-pay | Admitting: Internal Medicine

## 2022-05-18 VITALS — BP 120/60 | HR 71 | Ht 70.0 in | Wt 217.0 lb

## 2022-05-18 DIAGNOSIS — K089 Disorder of teeth and supporting structures, unspecified: Secondary | ICD-10-CM

## 2022-05-18 DIAGNOSIS — E538 Deficiency of other specified B group vitamins: Secondary | ICD-10-CM

## 2022-05-18 DIAGNOSIS — R195 Other fecal abnormalities: Secondary | ICD-10-CM | POA: Diagnosis not present

## 2022-05-18 DIAGNOSIS — K5080 Crohn's disease of both small and large intestine without complications: Secondary | ICD-10-CM

## 2022-05-18 DIAGNOSIS — D509 Iron deficiency anemia, unspecified: Secondary | ICD-10-CM

## 2022-05-18 DIAGNOSIS — K50819 Crohn's disease of both small and large intestine with unspecified complications: Secondary | ICD-10-CM | POA: Diagnosis not present

## 2022-05-18 LAB — CBC WITH DIFFERENTIAL/PLATELET
Basophils Absolute: 0.1 10*3/uL (ref 0.0–0.1)
Basophils Relative: 0.8 % (ref 0.0–3.0)
Eosinophils Absolute: 0 10*3/uL (ref 0.0–0.7)
Eosinophils Relative: 0 % (ref 0.0–5.0)
HCT: 43.3 % (ref 39.0–52.0)
Hemoglobin: 14.4 g/dL (ref 13.0–17.0)
Lymphocytes Relative: 8.7 % — ABNORMAL LOW (ref 12.0–46.0)
Lymphs Abs: 0.7 10*3/uL (ref 0.7–4.0)
MCHC: 33.3 g/dL (ref 30.0–36.0)
MCV: 96.4 fl (ref 78.0–100.0)
Monocytes Absolute: 0.8 10*3/uL (ref 0.1–1.0)
Monocytes Relative: 10.6 % (ref 3.0–12.0)
Neutro Abs: 6.2 10*3/uL (ref 1.4–7.7)
Neutrophils Relative %: 79.9 % — ABNORMAL HIGH (ref 43.0–77.0)
Platelets: 384 10*3/uL (ref 150.0–400.0)
RBC: 4.49 Mil/uL (ref 4.22–5.81)
RDW: 13.5 % (ref 11.5–15.5)
WBC: 7.8 10*3/uL (ref 4.0–10.5)

## 2022-05-18 LAB — FERRITIN: Ferritin: 54.2 ng/mL (ref 22.0–322.0)

## 2022-05-18 LAB — VITAMIN B12: Vitamin B-12: 466 pg/mL (ref 211–911)

## 2022-05-18 MED ORDER — MESALAMINE ER 500 MG PO CPCR: 1000.0000 mg | ORAL_CAPSULE | Freq: Four times a day (QID) | ORAL | 3 refills | Status: AC

## 2022-05-18 MED ORDER — MESALAMINE ER 500 MG PO CPCR: 1000.0000 mg | ORAL_CAPSULE | Freq: Four times a day (QID) | ORAL | 3 refills | Status: DC

## 2022-05-18 NOTE — Progress Notes (Signed)
Subjective:    Patient ID: [name], male    DOB: April 12, 1956, 66 y.o.   MRN: [id number]  HPI [name] is a 66 year old male with history of ileocolonic fistulizing and stricturing Crohn's disease dating back to 1995, prior right hemicolectomy, history of LOA and left hemicolectomy, history of IDA, gallstones with prior cholecystectomy, history of ankylosing spondylitis, prior DVT who is here for follow-up.  He was last seen on 06/07/2021 and is here today with his family member [name].  He reports that he is dealing with looser and at times urgent stools.  This is occurring about 2 days/week.  His family thinks that this may be due to soda intake.  He reports that he would typically drink 1 perhaps 2 Cokes per day.  He also notices that with artificial sweetener foods like strawberry Jell-O and strawberry ice cream looser stools are worse.  He is not having abdominal pain.  He does not have nausea or vomiting.  He has not seen visible blood in stool or melena.  He called in with diarrhea type symptoms about 8 weeks ago and stool studies indicated a borderline fecal calprotectin but negative infectious panel.  He was treated with ileal release budesonide 9 mg x 2 weeks, 6 mg x 2 weeks and 3 mg x 2 weeks.  He thinks that this may have helped somewhat.  He has continued Stelara and azathioprine daily.  Of note we stopped Pentasa at last office visit.  He is currently living at [facility].  Last colonoscopy December 2021.  Review of Systems As per HPI, otherwise negative  Current Medications, Allergies, Past Medical History, Past Surgical History, Family History and Social History were reviewed in [employer] record.     Objective:   Physical Exam BP 120/60   Pulse 71   Ht 5' 10"  (1.778 m)   Wt 217 lb (98.4 kg)   BMI 31.14 kg/m . Gen: awake, alert, NAD HEENT: anicteric, op clear, significant plaque buildup on the teeth/rather poor dentition CV:  RRR, no mrg Pulm: CTA b/l Abd: soft, NT/ND, +BS throughout Ext: no c/c/e Neuro: nonfocal  May 2023 Fecal calprotectin 101    Assessment & Plan:   65 year old male with history of ileocolonic fistulizing and stricturing Crohn's disease dating back to 1995, prior right hemicolectomy, history of LOA and left hemicolectomy, history of IDA, gallstones with prior cholecystectomy, history of ankylosing spondylitis, prior DVT who is here for follow-up.   Crohn's ileocolitis with history of fistula and stricture --Stelara since February 2022 and low-dose azathioprine.  Of note Pentasa was stopped last year. He has had recent increase in loose stools which can be urgent.  Not meeting criteria for diarrhea as it is only twice daily.  I am suspicious that he has had more mild inflammation since stopping 5-ASA therapy.  Dietary factors as well such as carbonated beverages and artificial sweeteners --Continue Stelara at current dose every 8 weeks --Continue azathioprine 50 mg daily --Restart Pentasa 1 g 4 times daily --Remain off of budesonide --Avoid artificial sweeteners as well as sodas --Quantiferon gold today, CBC  2.  B12 deficiency --continue monthly IM B12 and check B12 today  3.  History of IDA --previously having resolved, repeat CBC and check ferritin today  4.  Poor dentition --I encouraged Bruin to brushes teeth twice daily.  He is not doing this on a regular basis.  Dental cleanings have been somewhat cost prohibitive. --Teeth brushing twice daily which we  reviewed together today; we discussed the importance of this as poor dental hygiene can affect overall health and make high risk for infection --Okay for over-the-counter Biotene  71-monthto 117-montheturn to clinic  30 minutes total spent today including patient facing time, coordination of care, reviewing medical history/procedures/pertinent radiology studies, and documentation of the encounter.

## 2022-05-18 NOTE — Patient Instructions (Addendum)
Your provider has requested that you go to the basement level for lab work before leaving today. Press "B" on the elevator. The lab is located at the first door on the left as you exit the elevator.  Stay off of budesonide for now.  Let's cut back and eventually cut out drinking those sodas!   Make sure you are brushing those teeth two times a day!  We have given you a prescription for pentasa to take to the nurses at [facility].  Continue azathioprine and stelara as directed.  If you are age 43 or older, your body mass index should be between 23-30. Your Body mass index is 31.14 kg/m. If this is out of the aforementioned range listed, please consider follow up with your Primary Care Provider.  If you are age 23 or younger, your body mass index should be between 19-25. Your Body mass index is 31.14 kg/m. If this is out of the aformentioned range listed, please consider follow up with your Primary Care Provider.   ________________________________________________________  The  GI providers would like to encourage you to use [facility] to communicate with providers for non-urgent requests or questions.  Due to long hold times on the telephone, sending your provider a message by [facility] may be a faster and more efficient way to get a response.  Please allow 48 business hours for a response.  Please remember that this is for non-urgent requests.  _______________________________________________________  Due to recent changes in healthcare laws, you may see the results of your imaging and laboratory studies on MyChart before your provider has had a chance to review them.  We understand that in some cases there may be results that are confusing or concerning to you. Not all laboratory results come back in the same time frame and the provider may be waiting for multiple results in order to interpret others.  Please give [place] 48 hours in order for your provider to thoroughly review all the results  before contacting the office for clarification of your results.

## 2022-05-22 LAB — QUANTIFERON-TB GOLD PLUS
Mitogen-NIL: 0.37 IU/mL
NIL: 0.02 IU/mL
QuantiFERON-TB Gold Plus: UNDETERMINED — AB
TB1-NIL: 0 IU/mL
TB2-NIL: 0 IU/mL

## 2022-06-05 ENCOUNTER — Telehealth: Payer: Self-pay | Admitting: Neurology

## 2022-06-05 NOTE — Telephone Encounter (Signed)
Katina from [facility] called to let Dr know that Tracys primidone level was 20.2.  She wanted to ask how long to wait to draw labs after medication is given.  Her # is [phone].

## 2022-06-05 NOTE — Telephone Encounter (Signed)
I do not see that Dr. Delice Lesch has ordered drug levels.  Typically, we check levels prior to when the next dose is due.

## 2022-06-05 NOTE — Telephone Encounter (Signed)
Called several times and was unable to reach anyone in the office to give them this information

## 2022-06-08 ENCOUNTER — Telehealth: Payer: Self-pay | Admitting: Neurology

## 2022-06-08 NOTE — Telephone Encounter (Signed)
[name] stated [name] has been getting labs done and they are concerned about this primidone level. She would like a call back

## 2022-06-08 NOTE — Telephone Encounter (Signed)
Spoke to [place]  nd gave Dr. Ena Dawley recommendations

## 2022-06-08 NOTE — Telephone Encounter (Signed)
Spoke to patients sister and she asked me to call patients daughter Dr. Larey Seat at [phone] and ask her to return my call. I did let Dr,. Belenda Cruise that they need to take patients levels prior to his medication and not after. They have taken levels at 11:00Am after giving patient his medication at 9:00 am . Dr. Belenda Cruise is faxing me patients labs over the past few times to update our files on this patient. I did let them know we always return calls and if they need anything.

## 2022-07-20 ENCOUNTER — Ambulatory Visit: Payer: Medicare Other | Admitting: Neurology

## 2022-07-24 ENCOUNTER — Telehealth: Payer: Self-pay

## 2022-07-24 NOTE — Telephone Encounter (Signed)
Alwyn Ren called back from [facility]. She wanted to let me know they are struggling to get the patients Primidone at te correct times to reflect his actual levels they take it to soon or too late and then it turns into an emergency patient has been sent to hospital and telehelath notified. Levels were 18 today. Alwyn Ren is the NP and asking if they can discontinue doing  levels daily to avoid these emergency calls that are not necessary [phone]

## 2022-07-24 NOTE — Telephone Encounter (Signed)
I am not the one who ordered daily Primidone levels. He does not need daily levels. Just take Primidone as prescribed, the only time a level is indicated is if he has a change in his symptoms. If he is fine, no need to check.

## 2022-07-31 ENCOUNTER — Ambulatory Visit (INDEPENDENT_AMBULATORY_CARE_PROVIDER_SITE_OTHER): Payer: Medicare Other | Admitting: Neurology

## 2022-07-31 ENCOUNTER — Ambulatory Visit (INDEPENDENT_AMBULATORY_CARE_PROVIDER_SITE_OTHER): Payer: Medicare Other | Admitting: Podiatry

## 2022-07-31 ENCOUNTER — Encounter: Payer: Self-pay | Admitting: Podiatry

## 2022-07-31 ENCOUNTER — Encounter: Payer: Self-pay | Admitting: Neurology

## 2022-07-31 VITALS — BP 105/66 | HR 76 | Ht 70.0 in | Wt 215.0 lb

## 2022-07-31 DIAGNOSIS — M79675 Pain in left toe(s): Secondary | ICD-10-CM | POA: Diagnosis not present

## 2022-07-31 DIAGNOSIS — R251 Tremor, unspecified: Secondary | ICD-10-CM

## 2022-07-31 DIAGNOSIS — B351 Tinea unguium: Secondary | ICD-10-CM

## 2022-07-31 DIAGNOSIS — M79674 Pain in right toe(s): Secondary | ICD-10-CM

## 2022-07-31 DIAGNOSIS — G40009 Localization-related (focal) (partial) idiopathic epilepsy and epileptic syndromes with seizures of localized onset, not intractable, without status epilepticus: Secondary | ICD-10-CM

## 2022-07-31 MED ORDER — LEVETIRACETAM 500 MG PO TABS: ORAL_TABLET | ORAL | 11 refills | Status: DC
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
## 2022-07-31 NOTE — Progress Notes (Signed)
This patient presents to the office with chief complaint of long thick painful nails.  Patient says the nails are painful walking and wearing shoes.  This patient is unable to self treat.  This patient is unable to trim her nails since she is unable to reach her nails.  She presents to the office for preventative foot care services.  General Appearance  Alert, conversant and in no acute stress.  Vascular  Dorsalis pedis and posterior tibial  pulses are palpable  bilaterally.  Capillary return is within normal limits  bilaterally. Temperature is within normal limits  bilaterally.  Neurologic  Senn-Weinstein monofilament wire test within normal limits  bilaterally. Muscle power within normal limits bilaterally.  Nails Thick disfigured discolored nails with subungual debris  hallux  nailsbilaterally. No evidence of bacterial infection or drainage bilaterally.  Orthopedic  No limitations of motion  feet .  No crepitus or effusions noted.  No bony pathology or digital deformities noted.  Skin  normotropic skin with no porokeratosis noted bilaterally.  No signs of infections or ulcers noted.     Onychomycosis  Nails  B/L.  Pain in right toes  Pain in left toes  Debridement of nails both feet followed trimming the nails with dremel tool.    RTC 4 months.   Gardiner Barefoot DPM

## 2022-07-31 NOTE — Patient Instructions (Signed)
Always good to see you. Continue all your medications. If the vertigo happens repeatedly, let me know and we will order Vestibular therapy. Follow-up in 6 months, call for any changes.

## 2022-08-10 ENCOUNTER — Telehealth: Payer: Self-pay | Admitting: Internal Medicine

## 2022-08-11 ENCOUNTER — Other Ambulatory Visit (HOSPITAL_COMMUNITY): Payer: Self-pay

## 2022-08-22 ENCOUNTER — Telehealth: Payer: Self-pay | Admitting: Internal Medicine

## 2022-08-22 NOTE — Telephone Encounter (Signed)
I called [place] at [phone] and spoke to [place] as [place] was gone for the day. I told her that Dr Hilarie Fredrickson said [facility] has to have brand name only of the Pentasa. The generic is being discontinued by the manufacturer. I have faxed it to the prior authorization team to work on for [place].

## 2022-08-22 NOTE — Telephone Encounter (Signed)
Catina from [place] called states some paperwork will be faxed over to our office regarding patient Pentasa 500 mg that being discontinued at the [facility]. States a substitute will need to be call in for patient.

## 2022-08-28 ENCOUNTER — Other Ambulatory Visit (HOSPITAL_COMMUNITY): Payer: Self-pay

## 2022-11-07 ENCOUNTER — Encounter: Payer: Self-pay | Admitting: Internal Medicine

## 2022-12-04 ENCOUNTER — Ambulatory Visit: Payer: Medicaid Other | Admitting: Podiatry

## 2022-12-07 ENCOUNTER — Encounter: Payer: Self-pay | Admitting: Podiatry

## 2022-12-07 ENCOUNTER — Ambulatory Visit (INDEPENDENT_AMBULATORY_CARE_PROVIDER_SITE_OTHER): Payer: Medicare Other | Admitting: Podiatry

## 2022-12-07 VITALS — BP 167/69 | HR 65

## 2022-12-07 DIAGNOSIS — M79674 Pain in right toe(s): Secondary | ICD-10-CM

## 2022-12-07 DIAGNOSIS — B351 Tinea unguium: Secondary | ICD-10-CM | POA: Diagnosis not present

## 2022-12-07 DIAGNOSIS — M79675 Pain in left toe(s): Secondary | ICD-10-CM | POA: Diagnosis not present

## 2022-12-07 NOTE — Progress Notes (Signed)
This patient presents to the office with chief complaint of long thick painful nails.  Patient says the nails are painful walking and wearing shoes.  This patient is unable to self treat.  This patient is unable to trim her nails since she is unable to reach her nails.  She presents to the office for preventative foot care services.  General Appearance  Alert, conversant and in no acute stress.  Vascular  Dorsalis pedis and posterior tibial  pulses are palpable  bilaterally.  Capillary return is within normal limits  bilaterally. Temperature is within normal limits  bilaterally.  Neurologic  Senn-Weinstein monofilament wire test within normal limits  bilaterally. Muscle power within normal limits bilaterally.  Nails Thick disfigured discolored nails with subungual debris  hallux  nailsbilaterally. No evidence of bacterial infection or drainage bilaterally.  Orthopedic  No limitations of motion  feet .  No crepitus or effusions noted.  No bony pathology or digital deformities noted.  Skin  normotropic skin with no porokeratosis noted bilaterally.  No signs of infections or ulcers noted.     Onychomycosis  Nails  B/L.  Pain in right toes  Pain in left toes  Debridement of nails both feet followed trimming the nails with dremel tool.    RTC 4 months.   Averil Digman DPM   

## 2023-02-12 ENCOUNTER — Ambulatory Visit: Payer: Medicare Other | Admitting: Cardiology

## 2023-02-21 HISTORY — PX: OTHER SURGICAL HISTORY: SHX169

## 2023-02-23 ENCOUNTER — Ambulatory Visit (INDEPENDENT_AMBULATORY_CARE_PROVIDER_SITE_OTHER): Payer: Medicare Other | Admitting: Neurology

## 2023-02-23 ENCOUNTER — Encounter: Payer: Self-pay | Admitting: Neurology

## 2023-02-23 VITALS — BP 143/54 | HR 65 | Ht 70.0 in | Wt 209.8 lb

## 2023-02-23 DIAGNOSIS — G40009 Localization-related (focal) (partial) idiopathic epilepsy and epileptic syndromes with seizures of localized onset, not intractable, without status epilepticus: Secondary | ICD-10-CM

## 2023-02-23 MED ORDER — LEVETIRACETAM 500 MG PO TABS: ORAL_TABLET | ORAL | 3 refills | Status: DC

## 2023-02-23 MED ORDER — PRIMIDONE 250 MG PO TABS: ORAL_TABLET | ORAL | 3 refills | Status: DC
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
## 2023-02-26 ENCOUNTER — Ambulatory Visit: Payer: Medicare Other | Attending: Cardiology

## 2023-02-26 ENCOUNTER — Encounter: Payer: Self-pay | Admitting: Cardiology

## 2023-02-26 ENCOUNTER — Ambulatory Visit: Payer: Medicare Other | Attending: Cardiology | Admitting: Cardiology

## 2023-02-26 VITALS — BP 124/76 | HR 59 | Ht 70.0 in | Wt 214.8 lb

## 2023-02-26 DIAGNOSIS — I77810 Thoracic aortic ectasia: Secondary | ICD-10-CM

## 2023-02-26 DIAGNOSIS — I447 Left bundle-branch block, unspecified: Secondary | ICD-10-CM

## 2023-02-26 DIAGNOSIS — I350 Nonrheumatic aortic (valve) stenosis: Secondary | ICD-10-CM | POA: Diagnosis not present

## 2023-02-26 DIAGNOSIS — I5032 Chronic diastolic (congestive) heart failure: Secondary | ICD-10-CM

## 2023-02-26 DIAGNOSIS — R55 Syncope and collapse: Secondary | ICD-10-CM

## 2023-02-26 DIAGNOSIS — I351 Nonrheumatic aortic (valve) insufficiency: Secondary | ICD-10-CM

## 2023-02-26 DIAGNOSIS — R42 Dizziness and giddiness: Secondary | ICD-10-CM | POA: Diagnosis not present

## 2023-02-26 DIAGNOSIS — G903 Multi-system degeneration of the autonomic nervous system: Secondary | ICD-10-CM

## 2023-02-26 NOTE — Progress Notes (Unsigned)
Enrolled for [employer] to mail a ZIO XT long term holter monitor to his cousin:  Glennon Hamilton in care of Dezmin Seitter [address] [place], [place]  [address]  She will apply monitor.

## 2023-02-26 NOTE — Progress Notes (Signed)
Primary Care Provider: No primary care provider on file. [facility] Cardiologist: None  Previously seen by [name] from [facility] cardiovascular Electrophysiologist: None  Clinic Note: Chief Complaint  Patient presents with   New Patient (Initial Visit)    Abnormal EKG    ===================================  ASSESSMENT/PLAN   Problem List Items Addressed This Visit       Cardiology Problems   Postural dizziness with near syncope - Primary (Chronic)    Maintaining hydration.  Avoid diuretic; Especially with having some dependent edema in the left leg, recommend support stockings and foot elevation.  Also recommend pedal exercises and walking.  Check 14-day Zio patch monitor to ensure no arrhythmias.  Also check 2D echo for structural assessment.      Relevant Orders   EKG 12-Lead (Completed)   ECHOCARDIOGRAM COMPLETE   LONG TERM MONITOR (3-14 DAYS)   Nonrheumatic aortic valve stenosis (Chronic)    Clearly has a murmur on exam and had previously documented mild aortic insufficiency and mild aortic stenosis.  Will check 2D echo to evaluate stability.  Based on that result we will determine how frequently need to be followed going forward. I explained that until symptomatic, we do not concern ourselves too much with aortic valve disease unless it is significant.      Relevant Orders   ECHOCARDIOGRAM COMPLETE   Nonrheumatic aortic valve insufficiency   Relevant Orders   ECHOCARDIOGRAM COMPLETE   Hypotension    Likely associated postural dizziness.  Continue midodrine.  Adequate hydration.  Avoid diuretic as well as afterload reduction.      Dilation of thoracic aorta (HCC)    Check 2D echo just to get a better sense what the aorta looks like.  Depending on that result may want to consider intermittently following up with a CTA.      Relevant Orders   ECHOCARDIOGRAM COMPLETE   Diastolic CHF, chronic (HCC) (Chronic)    He was given a diagnosis of  chronic diastolic heart failure, but he denies any symptoms to suggest CHF and his previous echoes have been pretty normal.  Euvolemic on exam with no symptoms of PND, orthopnea or edema.  He does have aortic stenosis murmur and we will check a 2D echo just to ensure that it is stable.  This will also help [place] to see his ascending aorta.  He is on midodrine to keep his blood pressures and therefore we do not have room to use afterload reduction.  Would also be very reluctant to use diuretic.  Similarly, no beta-blocker.      Relevant Orders   EKG 12-Lead (Completed)   ECHOCARDIOGRAM COMPLETE   LONG TERM MONITOR (3-14 DAYS)     Other   Left bundle branch block (LBBB) on electrocardiogram    I personally do not see any evidence of left bundle branch block.  I do not see anything to suggest left anterior fascicular block.  There is a possibility that he may have rate related bundle branch block so we will check a 2-week monitor to evaluate for any signs of rate related bundle but also to evaluate his baseline dizziness.  Check 2D echo just to ensure no new structural changes.      Relevant Orders   EKG 12-Lead (Completed)   ECHOCARDIOGRAM COMPLETE   LONG TERM MONITOR (3-14 DAYS)    ===================================  HPI:    [name] is a 67 y.o. 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  There is also a question of possible Chronic Diastolic Heart Failure he is being seen today for the evaluation of Left Bundle Branch Block, DIZZINESS at the request of [name], *, [name], FNP.  Patient is accompanied by his cousin-[name] who usually brings him to clinic visits.  His sister who is the power of attorney does not live locally.  He has baseline cognitive delay, and lives in a group home.  He also has a history of seizure disorder and Crohn's disease (s/p hemicolectomy), cholelithiasis  s/p cholecystectomy and ankylosing spondylitis.  At baseline he is relatively sedentary-and does not do much of any activity.  He also has a outpatient DNR form.  [name] was last seen by Dr. DR.  in October 2021-he was doing relatively well.  Plan was to follow-up 2D echo and summer 2023.  Was on low-dose Lasix for swelling.  No longer on anticoagulation.  There was no comment in this note about diastolic heart failure.  Recent Hospitalizations:  05/10/2022-ER visit for seizures  He was seen by [name], FNP on May 22.-This note indicated that back on February 19 he just noted some dizziness with blood pressure concerns.  However BP seem to be normal on recurrent checks.  Since that visit he had had a fall with an EKG (the reading actually did not say complete left bundle branch block is an incomplete, however this did not meet criteria for an incomplete bundle branch block).  Unfortunately did fracture his wrist with a fall and was referred to Ortho.  Treated with splint. Based on this EKG changes he was referred to cardiology.  Despite the fact that he had been seen in October 2021 by [name], he was referred to [facility]  Reviewed  CV studies:    The following studies were reviewed today: (if available, images/films reviewed: From Epic Chart or Care Everywhere) TTE 07/05/2020 [facility]): Normal LV size and function.  EF 65%.  Normal diastolic parameters.  Mild AS.  GR 2 DD. TTE 12/29/2020: EF 65 to 70%.  No RWMA.  Mild LVH.  GR 2 DD.  Normal RV P.  Mild-AS & Mild-Mod AI ascending aorta 43 mm.  Normal RVP and RAP.  Vascular US 07/2016: + Occlusive DVT involving  Left popliteal, proximal profunda, femoral and common femoral veins, PT & Peroneal Veins - patent. + Superficial thrombus of Left prox GSV-Saphenofemoral Junction.  No DVT involving the right.  Interval History:   [name] Presents here today along with his cousin who thankfully helps  out with some of the history.  Very difficult historian because he does not provide much information.  At baseline very sedentary and not active.  He denies any chest pain or pressure with rest or exertion.  He is quite deconditioned and therefore will get short of breath if he does more vigorous PT exercises.  When he works with 1 particular therapist, he will get some shortness of breath.  Denies any PND orthopnea and no notable edema. At baseline he has poor balance and dizziness.  He uses midodrine to help with maintaining blood pressure. He still has some residual swelling in the left leg, but has more prominent varicose vein findings on the right side. He denies any rapid irregular heartbeats or palpitations associated with dizziness.   CV Review of Symptoms (Summary): : positive for - dyspnea on exertion and positional dizziness; unilateral swelling negative for -  irregular heartbeat, orthopnea, palpitations, paroxysmal nocturnal dyspnea, rapid heart rate, shortness of breath, or syncope or near syncope, TIA or amaurosis fugax or claudication  REVIEWED OF SYSTEMS   Review of Systems  Constitutional:  Positive for malaise/fatigue. Negative for weight loss.  HENT:  Negative for congestion.   Respiratory:  Negative for cough.   Gastrointestinal:  Negative for blood in stool and melena.  Genitourinary:  Negative for hematuria.  Musculoskeletal:  Positive for back pain and joint pain.  Neurological:  Positive for dizziness. Negative for focal weakness and weakness.  Psychiatric/Behavioral:         Blunted affect and mood.    I have reviewed and (if needed) personally updated the patient's problem list, medications, allergies, past medical and surgical history, social and family history.   PAST MEDICAL HISTORY   Past Medical History:  Diagnosis Date   Abscess of anal and rectal regions    Allergic rhinitis    Aortic valve insufficiency    non-rheumatic    BPH (benign prostatic  hyperplasia)    CHF (congestive heart failure) (HCC)     chronic diastolic heart failure    Cholelithiasis    Crohn's disease (HCC)    Dizziness and giddiness    DVT (deep venous thrombosis) (HCC)    Dysphagia    Epilepsy (HCC)    "petit mal; only at home" (6/9//2015)   Heart murmur    Hepatomegaly    History of blood transfusion ~ 1992   "related to my Crohn's"   History of immunosuppression therapy    History of stomach ulcers    Hyperplastic colon polyp    Hypokalemia    hx of    Iron deficiency anemia, unspecified    iron deficiency anemia    Malaise    Mental disability    Muscle weakness (generalized)    Other B-complex deficiencies    REGIONAL ENTERITIS, LARGE INTESTINE 10/05/2000   Qualifier: Diagnosis of  By: Thereasa Solo     S/P partial colectomy for Crohn's disease 12/01/2015   SBO (small bowel obstruction) (HCC) 12/01/2015   Sepsis (HCC)    hx of    Severe protein-calorie malnutrition Lily Kocher: less than 60% of standard weight) (HCC)    Syncope 03/31/2014   Syncope and collapse 03/31/2014   "while driving"   Tremor    Unsteadiness on feet    Urticaria    Vitamin D deficiency     PAST SURGICAL HISTORY   Past Surgical History:  Procedure Laterality Date   CHOLECYSTECTOMY N/A 06/06/2018   Procedure: LAPAROSCOPIC CHOLECYSTECTOMY WITH INTRAOPERATIVE CHOLANGIOGRAM ERAS PATHWAY;  Surgeon: Ovidio Kin, MD;  Location: [facility];  Service: General;  Laterality: N/A;   COLON SURGERY     Anastomosis 45cm from anal verge c/w deswecending colon anastomosis   COLONOSCOPY  2013, 2016   COLOSTOMY TAKEDOWN N/A 08/01/2016   Procedure: LAPAROSCOPIC COLOSTOMY REVERSAL;  Surgeon: Ovidio Kin, MD;  Location: [facility];  Service: General;  Laterality: N/A;   HEMICOLECTOMY  1985   ileocectomy    HERNIA REPAIR     LAPAROSCOPY N/A 12/04/2015   Procedure: LAPAROSCOPY DIAGNOSTIC, LAPAROSCOPIC INTEROLYSIS OF ADHESIONS, HAND- ASSISTED SMALL BOWEL RESECTION, MOBILIZATION OF SPLENIC  FLEXURE, LEFT COLECTOMY WITH CREATION OF COLOSTOMY (HARTMAN'S PROCEDURE);  Surgeon: Ovidio Kin, MD;  Location: [facility];  Service: General;  Laterality: N/A;   PILONIDAL CYST EXCISION  1980's   TONSILLECTOMY  ~ 1967   UMBILICAL HERNIA REPAIR  ~ 1983    MEDICATIONS/ALLERGIES  Current Meds  Medication Sig   acetaminophen (TYLENOL) 325 MG tablet Take 650 mg by mouth every 6 (six) hours as needed.   acetaminophen (TYLENOL) 500 MG tablet Take 500 mg by mouth at bedtime as needed.   azaTHIOprine (IMURAN) 50 MG tablet Take 1 tablet (50 mg total) by mouth daily.   cetaphil (CETAPHIL) lotion Apply 1 application topically at bedtime. Apply to arms,legs,torso topically at bedtime for dry skin   Cyanocobalamin (VITAMIN B-12 IJ) Inject 1 mL as directed every 30 (thirty) days.   famotidine (PEPCID) 20 MG tablet Take 20 mg by mouth 2 (two) times daily.   ferrous sulfate 325 (65 FE) MG tablet Take 325 mg by mouth daily with breakfast.   fluticasone (FLONASE) 50 MCG/ACT nasal spray Place into both nostrils daily.   folic acid (FOLVITE) 1 MG tablet Take 1 mg by mouth daily.   Infant Care Products [facility]) OINT Apply topically. Apply to sacrum topically every 8 hours as needed for redness/irritation   levETIRAcetam (KEPPRA) 500 MG tablet Take 1 tablet daily   loperamide (IMODIUM) 2 MG capsule Take 2 mg by mouth as needed for diarrhea or loose stools.   loratadine (CLARITIN) 10 MG tablet Take 10 mg by mouth daily.   mesalamine (PENTASA) 500 MG CR capsule Take 2 capsules (1,000 mg total) by mouth 4 (four) times daily. Opened in applesauce   midodrine (PROAMATINE) 10 MG tablet Take 1 tablet by mouth 2 (two) times daily.   midodrine (PROAMATINE) 5 MG tablet Take 5 mg by mouth at bedtime.   primidone (MYSOLINE) 250 MG tablet Take 1 tab in AM, 2 tabs in PM   PSYLLIUM HUSK PO Take by mouth.   tamsulosin (FLOMAX) 0.4 MG CAPS capsule Take 1 capsule (0.4 mg total) by mouth daily.   triamcinolone cream (KENALOG)  0.1 % Apply 1 application  topically 2 (two) times daily.   ustekinumab (STELARA) 45 MG/0.5ML SOSY injection Inject 0.5 mLs (45 mg total) into the skin every 8 (eight) weeks.   Vitamin D, Ergocalciferol, (DRISDOL) 1.25 MG (50000 UT) CAPS capsule Take 50,000 Units by mouth every 30 (thirty) days. Due 01-04-21    Allergies  Allergen Reactions   Darvon [Propoxyphene] Other (See Comments)    Unknown reaction   Other Other (See Comments)    Ask    SOCIAL HISTORY/FAMILY HISTORY   Reviewed in Epic:   Social History   Tobacco Use   Smoking status: Never   Smokeless tobacco: Never  Vaping Use   Vaping Use: Never used  Substance Use Topics   Alcohol use: Never   Drug use: Never   Social History   Social History Narrative   Lives at [facility] [phone]   Right handed    Medical POA is [facility] [phone])  Family History  Problem Relation Age of Onset   Skin cancer Father    Colon cancer Mother    Dementia Mother    Seizures Neg Hx    Crohn's disease Neg Hx    Esophageal cancer Neg Hx    Rectal cancer Neg Hx    Stomach cancer Neg Hx   Mother had colon cancer along with dementia.  Father just had skin cancer but otherwise history noncontributory.  OBJCTIVE -PE, EKG, labs   Wt Readings from Last 3 Encounters:  02/26/23 214 lb 12.8 oz (97.4 kg)  02/23/23 209 lb 12.8 oz (95.2 kg)  07/31/22 215 lb (97.5 kg)    Physical Exam: BP 124/76  Pulse (!) 59   Ht 5\' 10"  (1.778 m)   Wt 214 lb 12.8 oz (97.4 kg)   SpO2 98%   BMI 30.82 kg/m  Physical Exam Vitals reviewed.  Constitutional:      Appearance: He is obese. He is not ill-appearing or toxic-appearing.     Comments: Healthy-appearing.  No acute distress.  HENT:     Head: Normocephalic and atraumatic.  Neck:     Vascular: No carotid bruit or JVD.  Cardiovascular:     Rate and Rhythm: Normal rate and regular rhythm. No extrasystoles are present.    Chest Wall: PMI is not displaced.     Pulses:  Intact distal pulses.     Heart sounds: S1 normal and S2 normal. Murmur heard.     High-pitched harsh crescendo-decrescendo midsystolic murmur is present with a grade of 3/6 at the upper right sternal border radiating to the neck.     No friction rub. No gallop.  Pulmonary:     Effort: Pulmonary effort is normal. No respiratory distress.     Breath sounds: Normal breath sounds. No wheezing, rhonchi or rales.  Chest:     Chest wall: No tenderness.  Abdominal:     General: Abdomen is flat. Bowel sounds are normal. There is no distension.     Palpations: Abdomen is soft. There is no mass.     Tenderness: There is no abdominal tenderness.  Musculoskeletal:        General: Normal range of motion.     Cervical back: Normal range of motion and neck supple.     Right lower leg: No edema (Minimal).     Left lower leg: Edema (Trivial to 1+) present.  Skin:    General: Skin is warm and dry.     Coloration: Skin is not jaundiced.  Neurological:     General: No focal deficit present.     Mental Status: He is alert and oriented to person, place, and time.     Gait: Gait normal.  Psychiatric:     Comments: Or cognition.  Blunted mood and affect.     Adult ECG Report (From PCP 01/25/2023: Rate: 58 ;  Rhythm: normal sinus rhythm, premature ventricular contractions (PVC), and PVCs appear to be in the trigeminy pattern. ;  Normal axis, intervals and durations.  Narrative Interpretation: Normal EKG with PVCs.  (The reading was set as sinus rhythm with 1  AVB, intermittent ventricular trigeminy, incomplete LBBB.-The EKG that I have does not have any incomplete left bundle branch block findings.  02/26/2023 rate: 59 ;  Rhythm: sinus bradycardia and 1  AVB ; ?  Borderline LVH.    Narrative Interpretation: No findings to suggest LBBB.  No PVCs.   Recent Labs: Reviewed 03/13/2023: Na+ 138, K+ 4.7, Cl- 108, HCO3-27, BUN 18, Cr 0.89, Glu 94, Ca2+ 9.1; AST 14, ALT 13, AlkP 155;TSH 0.70 CBC: W 4.54, H/H  13.4/39.2, Plt 265; TIBC 214 (low), ferritin 78.  Folate 5.04 (low) ; B12 522.  No results found for: "CHOL", "HDL", "LDLCALC", "LDLDIRECT", "TRIG", "CHOLHDL" Lab Results  Component Value Date   CREATININE 1.28 (H) 05/10/2022   BUN 19 05/10/2022   NA 136 05/10/2022   K 4.7 05/10/2022   CL 106 05/10/2022   CO2 26 05/10/2022      Latest Ref Rng & Units 05/18/2022   11:38 AM 06/07/2021    3:03 PM 01/26/2021    9:41 AM  CBC  WBC 4.0 - 10.5 K/uL 7.8  6.0  5.9   Hemoglobin 13.0 - 17.0 g/dL 19.1  47.8  29.5   Hematocrit 39.0 - 52.0 % 43.3  38.8  38.2   Platelets 150.0 - 400.0 K/uL 384.0  279.0  224.0     Lab Results  Component Value Date   HGBA1C 5.3 07/24/2016   Lab Results  Component Value Date   TSH 0.672 01/16/2016    ================================================== I spent a total of 30 minutes with the patient spent in direct patient consultation.  Additional time spent with chart review  / charting (studies, outside notes, etc): 31 min Total Time: 61 min  Current medicines are reviewed at length with the patient today.  (+/- concerns) N/A  Notice: This dictation was prepared with Dragon dictation along with smart phrase technology. Any transcriptional errors that result from this process are unintentional and may not be corrected upon review.   Studies Ordered:  Orders Placed This Encounter  Procedures   LONG TERM MONITOR (3-14 DAYS)   EKG 12-Lead   ECHOCARDIOGRAM COMPLETE   No orders of the defined types were placed in this encounter.   Patient Instructions / Medication Changes & Studies & Tests Ordered   Patient Instructions  Medication Instructions:   No changes     Lab Work: Not needed    Testing/Procedures:  Will be schedule at [facility]  Your physician has requested that you have an echocardiogram. Echocardiography is a painless test that uses sound waves to create images of your heart. It provides your doctor with information about  the size and shape of your heart and how well your heart's chambers and valves are working. This procedure takes approximately one hour. There are no restrictions for this procedure. Please do NOT wear cologne, perfume, aftershave, or lotions (deodorant is allowed). Please arrive 15 minutes prior to your appointment time.  And  Will be mailed to you 3 to 7 days Your physician has recommended that you wear a holter monitor 14 days ZIO. Holter monitors are medical devices that record the heart's electrical activity. Doctors most often use these monitors to diagnose arrhythmias. Arrhythmias are problems with the speed or rhythm of the heartbeat. The monitor is a small, portable device. You can wear one while you do your normal daily activities. This is usually used to diagnose what is causing palpitations/syncope (passing out).   Follow-Up: At [facility], you and your health needs are our priority.  As part of our continuing mission to provide you with exceptional heart care, we have created designated Provider Care Teams.  These Care Teams include your primary Cardiologist (physician) and Advanced Practice Providers (APPs -  Physician Assistants and Nurse Practitioners) who all work together to provide you with the care you need, when you need it.     Your next appointment:   3 month(s)  The format for your next appointment:   In Person  Provider:   Bryan Lemma, MD   Other Instructions  ZIO XT- Long Term Monitor Instructions  Your physician has requested you wear a ZIO patch monitor for 14 days.  This is a single patch monitor. [employer] supplies one patch monitor per enrollment. Additional stickers are not available. Please do not apply patch if you will be having a Nuclear Stress Test,  Echocardiogram, Cardiac CT, MRI, or Chest Xray during the period you would be wearing the  monitor. The patch cannot be worn during these tests. You cannot remove and re-apply the  ZIO XT patch  monitor.  Your ZIO patch monitor will be mailed 3 day USPS to your address on file. It may take 3-5 days  to receive your monitor after you have been enrolled.  Once you have received your monitor, please review the enclosed instructions. Your monitor  has already been registered assigning a specific monitor serial # to you.  Billing and Patient Assistance Program Information  We have supplied [employer] with any of your insurance information on file for billing purposes. [employer] offers a sliding scale Patient Assistance Program for patients that do not have  insurance, or whose insurance does not completely cover the cost of the ZIO monitor.  You must apply for the Patient Assistance Program to qualify for this discounted rate.  To apply, please call [employer] at [phone], select option 4, select option 2, ask to apply for  Patient Assistance Program. Meredeth Ide will ask your household income, and how many people  are in your household. They will quote your out-of-pocket cost based on that information.  [employer] will also be able to set up a 83-month, interest-free payment plan if needed.  Applying the monitor   Shave hair from upper left chest.  Hold abrader disc by orange tab. Rub abrader in 40 strokes over the upper left chest as  indicated in your monitor instructions.  Clean area with 4 enclosed alcohol pads. Let dry.  Apply patch as indicated in monitor instructions. Patch will be placed under collarbone on left  side of chest with arrow pointing upward.  Rub patch adhesive wings for 2 minutes. Remove white label marked "1". Remove the white  label marked "2". Rub patch adhesive wings for 2 additional minutes.  While looking in a mirror, press and release button in center of patch. A small green light will  flash 3-4 times. This will be your only indicator that the monitor has been turned on.  Do not shower for the first 24 hours. You may shower after the first 24 hours.  Press the  button if you feel a symptom. You will hear a small click. Record Date, Time and  Symptom in the Patient Logbook.  When you are ready to remove the patch, follow instructions on the last 2 pages of Patient  Logbook. Stick patch monitor onto the last page of Patient Logbook.  Place Patient Logbook in the blue and white box. Use locking tab on box and tape box closed  securely. The blue and white box has prepaid postage on it. Please place it in the mailbox as  soon as possible. Your physician should have your test results approximately 7 days after the  monitor has been mailed back to [facility].  Call [facility] Customer Care at [phone] if you have questions regarding  your ZIO XT patch monitor. Call them immediately if you see an orange light blinking on your  monitor.  If your monitor falls off in less than 4 days, contact our Monitor department at [phone].  If your monitor becomes loose or falls off after 4 days call [employer] at [phone] for  suggestions on securing your monitor     Marykay Lex, MD, MS Bryan Lemma, M.D., M.S. Interventional Cardiologist  [facility]  Pager # [phone] Phone # [phone] [address] [place], [place] [address]   Thank you for choosing [facility] [place]!!

## 2023-02-26 NOTE — Patient Instructions (Addendum)
Medication Instructions:   No changes     Lab Work: Not needed    Testing/Procedures:  Will be schedule at [facility]  Your physician has requested that you have an echocardiogram. Echocardiography is a painless test that uses sound waves to create images of your heart. It provides your doctor with information about the size and shape of your heart and how well your heart's chambers and valves are working. This procedure takes approximately one hour. There are no restrictions for this procedure. Please do NOT wear cologne, perfume, aftershave, or lotions (deodorant is allowed). Please arrive 15 minutes prior to your appointment time.  And  Will be mailed to you 3 to 7 days Your physician has recommended that you wear a holter monitor 14 days ZIO. Holter monitors are medical devices that record the heart's electrical activity. Doctors most often use these monitors to diagnose arrhythmias. Arrhythmias are problems with the speed or rhythm of the heartbeat. The monitor is a small, portable device. You can wear one while you do your normal daily activities. This is usually used to diagnose what is causing palpitations/syncope (passing out).   Follow-Up: At [facility], you and your health needs are our priority.  As part of our continuing mission to provide you with exceptional heart care, we have created designated Provider Care Teams.  These Care Teams include your primary Cardiologist (physician) and Advanced Practice Providers (APPs -  Physician Assistants and Nurse Practitioners) who all work together to provide you with the care you need, when you need it.     Your next appointment:   3 month(s)  The format for your next appointment:   In Person  Provider:   Bryan Lemma, MD   Other Instructions  ZIO XT- Long Term Monitor Instructions  Your physician has requested you wear a ZIO patch monitor for 14 days.  This is a single patch monitor. [employer] supplies one  patch monitor per enrollment. Additional stickers are not available. Please do not apply patch if you will be having a Nuclear Stress Test,  Echocardiogram, Cardiac CT, MRI, or Chest Xray during the period you would be wearing the  monitor. The patch cannot be worn during these tests. You cannot remove and re-apply the  ZIO XT patch monitor.  Your ZIO patch monitor will be mailed 3 day USPS to your address on file. It may take 3-5 days  to receive your monitor after you have been enrolled.  Once you have received your monitor, please review the enclosed instructions. Your monitor  has already been registered assigning a specific monitor serial # to you.  Billing and Patient Assistance Program Information  We have supplied [employer] with any of your insurance information on file for billing purposes. [employer] offers a sliding scale Patient Assistance Program for patients that do not have  insurance, or whose insurance does not completely cover the cost of the ZIO monitor.  You must apply for the Patient Assistance Program to qualify for this discounted rate.  To apply, please call [employer] at [phone], select option 4, select option 2, ask to apply for  Patient Assistance Program. Meredeth Ide will ask your household income, and how many people  are in your household. They will quote your out-of-pocket cost based on that information.  [employer] will also be able to set up a 35-month, interest-free payment plan if needed.  Applying the monitor   Shave hair from upper left chest.  Hold abrader disc by orange tab. Rub abrader  in 40 strokes over the upper left chest as  indicated in your monitor instructions.  Clean area with 4 enclosed alcohol pads. Let dry.  Apply patch as indicated in monitor instructions. Patch will be placed under collarbone on left  side of chest with arrow pointing upward.  Rub patch adhesive wings for 2 minutes. Remove white label marked "1". Remove the white  label marked  "2". Rub patch adhesive wings for 2 additional minutes.  While looking in a mirror, press and release button in center of patch. A small green light will  flash 3-4 times. This will be your only indicator that the monitor has been turned on.  Do not shower for the first 24 hours. You may shower after the first 24 hours.  Press the button if you feel a symptom. You will hear a small click. Record Date, Time and  Symptom in the Patient Logbook.  When you are ready to remove the patch, follow instructions on the last 2 pages of Patient  Logbook. Stick patch monitor onto the last page of Patient Logbook.  Place Patient Logbook in the blue and white box. Use locking tab on box and tape box closed  securely. The blue and white box has prepaid postage on it. Please place it in the mailbox as  soon as possible. Your physician should have your test results approximately 7 days after the  monitor has been mailed back to [facility].  Call [facility] Customer Care at [phone] if you have questions regarding  your ZIO XT patch monitor. Call them immediately if you see an orange light blinking on your  monitor.  If your monitor falls off in less than 4 days, contact our Monitor department at [phone].  If your monitor becomes loose or falls off after 4 days call [employer] at [phone] for  suggestions on securing your monitor

## 2023-03-03 ENCOUNTER — Encounter: Payer: Self-pay | Admitting: Cardiology

## 2023-03-03 NOTE — Assessment & Plan Note (Signed)
Check 2D echo just to get a better sense what the aorta looks like.  Depending on that result may want to consider intermittently following up with a CTA.

## 2023-03-03 NOTE — Assessment & Plan Note (Addendum)
Maintaining hydration.  Avoid diuretic; Especially with having some dependent edema in the left leg, recommend support stockings and foot elevation.  Also recommend pedal exercises and walking.  Check 14-day Zio patch monitor to ensure no arrhythmias.  Also check 2D echo for structural assessment.

## 2023-03-03 NOTE — Assessment & Plan Note (Addendum)
I personally do not see any evidence of left bundle branch block.  I do not see anything to suggest left anterior fascicular block.  There is a possibility that he may have rate related bundle branch block so we will check a 2-week monitor to evaluate for any signs of rate related bundle but also to evaluate his baseline dizziness.  Check 2D echo just to ensure no new structural changes.

## 2023-03-03 NOTE — Assessment & Plan Note (Signed)
Likely associated postural dizziness.  Continue midodrine.  Adequate hydration.  Avoid diuretic as well as afterload reduction.

## 2023-03-03 NOTE — Assessment & Plan Note (Signed)
He was given a diagnosis of chronic diastolic heart failure, but he denies any symptoms to suggest CHF and his previous echoes have been pretty normal.  Euvolemic on exam with no symptoms of PND, orthopnea or edema.  He does have aortic stenosis murmur and we will check a 2D echo just to ensure that it is stable.  This will also help [place] to see his ascending aorta.  He is on midodrine to keep his blood pressures and therefore we do not have room to use afterload reduction.  Would also be very reluctant to use diuretic.  Similarly, no beta-blocker.

## 2023-03-03 NOTE — Assessment & Plan Note (Signed)
Clearly has a murmur on exam and had previously documented mild aortic insufficiency and mild aortic stenosis.  Will check 2D echo to evaluate stability.  Based on that result we will determine how frequently need to be followed going forward. I explained that until symptomatic, we do not concern ourselves too much with aortic valve disease unless it is significant.

## 2023-03-08 ENCOUNTER — Ambulatory Visit: Payer: Medicare Other | Admitting: Podiatry

## 2023-03-11 DIAGNOSIS — I5032 Chronic diastolic (congestive) heart failure: Secondary | ICD-10-CM

## 2023-03-11 DIAGNOSIS — R42 Dizziness and giddiness: Secondary | ICD-10-CM | POA: Diagnosis not present

## 2023-03-11 DIAGNOSIS — R55 Syncope and collapse: Secondary | ICD-10-CM

## 2023-03-11 DIAGNOSIS — I447 Left bundle-branch block, unspecified: Secondary | ICD-10-CM

## 2023-03-30 ENCOUNTER — Ambulatory Visit (INDEPENDENT_AMBULATORY_CARE_PROVIDER_SITE_OTHER): Payer: Medicare Other | Admitting: Podiatry

## 2023-03-30 VITALS — BP 167/59

## 2023-03-30 DIAGNOSIS — M79675 Pain in left toe(s): Secondary | ICD-10-CM | POA: Diagnosis not present

## 2023-03-30 DIAGNOSIS — M79674 Pain in right toe(s): Secondary | ICD-10-CM

## 2023-03-30 DIAGNOSIS — B351 Tinea unguium: Secondary | ICD-10-CM | POA: Diagnosis not present

## 2023-03-30 NOTE — Progress Notes (Signed)
  Subjective:  Patient ID: [name], male    DOB: [birth year],  MRN: [id number]  [name] presents to clinic today for painful elongated mycotic toenails 1-5 bilaterally which are tender when wearing enclosed shoe gear. Pain is relieved with periodic professional debridement. Chief Complaint  Patient presents with   Nail Problem    RFC,Referring Provider [name], MD,LOV:06/24     New problem(s): None.   Allergies  Allergen Reactions   Darvon [Propoxyphene] Other (See Comments)    Unknown reaction   Other Other (See Comments)    Ask    Review of Systems: Negative except as noted in the HPI. Objective:   Vitals:   03/30/23 1020 03/30/23 1021  BP: (!) 170/60 (!) 167/59   Constitutional [name] is a pleasant 67 y.o. male, obese in NAD. AAO x 3.   Vascular Vascular Examination: Capillary refill time immediate b/l. Vascular status intact b/l with palpable pedal pulses. Pedal hair diminished. No edema. No pain with calf compression b/l. Skin temperature gradient WNL b/l.   Neurological Examination: Sensation grossly intact b/l with 10 gram monofilament. Vibratory sensation intact b/l.   Dermatological Examination: Pedal skin with normal turgor, texture and tone b/l.  No open wounds. No interdigital macerations.   Toenails 1-5 b/l thick, discolored, elongated with subungual debris and pain on dorsal palpation.   No hyperkeratotic nor porokeratotic lesions present on today's visit.  Musculoskeletal Examination: Normal muscle strength 5/5 to all lower extremity muscle groups bilaterally. No pain, crepitus or joint limitation noted with ROM b/l LE. No gross bony pedal deformities b/l. Patient ambulates independently without assistive aids.  Radiographs: None   Assessment:   1. Pain due to onychomycosis of toenails of both feet    Plan:  Patient was evaluated and treated. All patient's and/or POA's questions/concerns addressed on today's  visit. Toenails 1-5 debrided in length and girth without incident. Continue soft, supportive shoe gear daily. Report any pedal injuries to medical professional. Call office if there are any questions/concerns. -Patient/POA to call should there be question/concern in the interim.  Return in about 4 months (around 07/30/2023).  [name], DPM

## 2023-04-02 ENCOUNTER — Encounter: Payer: Self-pay | Admitting: Internal Medicine

## 2023-04-02 ENCOUNTER — Ambulatory Visit (INDEPENDENT_AMBULATORY_CARE_PROVIDER_SITE_OTHER): Payer: Medicare Other | Admitting: Internal Medicine

## 2023-04-02 VITALS — BP 128/60 | HR 115 | Ht 70.0 in | Wt 207.0 lb

## 2023-04-02 DIAGNOSIS — E538 Deficiency of other specified B group vitamins: Secondary | ICD-10-CM

## 2023-04-02 DIAGNOSIS — Z1211 Encounter for screening for malignant neoplasm of colon: Secondary | ICD-10-CM

## 2023-04-02 DIAGNOSIS — Z8639 Personal history of other endocrine, nutritional and metabolic disease: Secondary | ICD-10-CM | POA: Diagnosis not present

## 2023-04-02 DIAGNOSIS — K50819 Crohn's disease of both small and large intestine with unspecified complications: Secondary | ICD-10-CM

## 2023-04-02 MED ORDER — NA SULFATE-K SULFATE-MG SULF 17.5-3.13-1.6 GM/177ML PO SOLN: 1.0000 | ORAL | 0 refills | Status: DC

## 2023-04-02 NOTE — Patient Instructions (Addendum)
Continue Azathioprine, Stelara, and Pentasa as directed at current dosing.   You have been scheduled for a colonoscopy. Please follow written instructions given to you at your visit today.  Please pick up your prep supplies at the pharmacy within the next 1-3 days. If you use inhalers (even only as needed), please bring them with you on the day of your procedure.  We have sent the following medications to your pharmacy for you to pick up at your convenience: Suprep -( printed rx and given to patient.)  _______________________________________________________  If your blood pressure at your visit was 140/90 or greater, please contact your primary care physician to follow up on this.  _______________________________________________________  If you are age 69 or older, your body mass index should be between 23-30. Your Body mass index is 29.7 kg/m. If this is out of the aforementioned range listed, please consider follow up with your Primary Care Provider.  If you are age 75 or younger, your body mass index should be between 19-25. Your Body mass index is 29.7 kg/m. If this is out of the aformentioned range listed, please consider follow up with your Primary Care Provider.   ________________________________________________________  The [facility] providers would like to encourage you to use [facility] to communicate with providers for non-urgent requests or questions.  Due to long hold times on the telephone, sending your provider a message by [facility] may be a faster and more efficient way to get a response.  Please allow 48 business hours for a response.  Please remember that this is for non-urgent requests.  _______________________________________________________  Due to recent changes in healthcare laws, you may see the results of your imaging and laboratory studies on MyChart before your provider has had a chance to review them.  We understand that in some cases there may be results that are  confusing or concerning to you. Not all laboratory results come back in the same time frame and the provider may be waiting for multiple results in order to interpret others.  Please give [place] 48 hours in order for your provider to thoroughly review all the results before contacting the office for clarification of your results.   Thank you for choosing me and [facility].  Dr.Jay Pyrtle

## 2023-04-02 NOTE — Progress Notes (Signed)
   Subjective:    Patient ID: [name], male    DOB: Nov 28, 1955, 67 y.o.   MRN: [id number]  HPI [name] is a 67 year old male with history of ileocolonic fistulizing and stricturing Crohn's disease dating back to 1995, prior right hemicolectomy, history of LOA and left hemicolectomy, history of IDA, gallstones with prior cholecystectomy, history of ankylosing spondylitis, prior DVT who is here for follow-up.  He was last seen on 05/18/2022 and is here today with his family member [name].   Currently he is doing very well on Stelara every 8 weeks, low-dose azathioprine 50 mg daily and Pentasa 1 g 4 times daily.  At his last visit we started Pentasa back because he had redeveloped diarrhea.  He feels well.  No recent diarrhea.  No abdominal pain.  No blood in stool.  No upper GI or hepatobiliary complaint.  The Pentasa has been challenging to swallow at times and so occasionally this is opened up on applesauce for administration.  He fell on 12/28/2022 and fractured his humerus but did not require surgery.  He is undergoing physical therapy.  Because of the fall he is seeing cardiology.  He had a Holter monitor and will have an echocardiogram tomorrow.  No report of any abnormalities on Holter monitor but they will follow-up with cardiology soon.  He is not having chest pain or dyspnea.  No lower extremity edema.  He is now getting routine dental cleanings every 3 months  Review of Systems As per HPI, otherwise negative  Current Medications, Allergies, Past Medical History, Past Surgical History, Family History and Social History were reviewed in [employer] record.    Objective:   Physical Exam BP 128/60 (BP Location: Left Arm, Patient Position: Sitting, Cuff Size: Normal)   Pulse (!) 115   Ht 5\' 10"  (1.778 m)   Wt 207 lb (93.9 kg)   SpO2 96%   BMI 29.70 kg/m  Gen: awake, alert, NAD HEENT: anicteric  CV: RRR, no mrg Pulm: CTA b/l Abd: soft, NT/ND, +BS  throughout Ext: no c/c/e Neuro: nonfocal  Labs reviewed from February done at outside lab     Assessment & Plan:  67 year old male with history of ileocolonic fistulizing and stricturing Crohn's disease dating back to 1995, prior right hemicolectomy, history of LOA and left hemicolectomy, history of IDA, gallstones with prior cholecystectomy, history of ankylosing spondylitis, prior DVT who is here for follow-up.  Crohn's ileocolitis with history of fistula and stricture --on Stelara since February 2022, low-dose azathioprine and Pentasa.  Currently in clinical remission. -- Continue Stelara every 8 weeks -- Continue azathioprine 50 mg daily -- Continue Pentasa 1 g 4 times daily.  Okay to open up and administer with applesauce if needed -- Surveillance colonoscopy recommended; we reviewed the risk, benefits and alternatives and he is agreeable and wishes to proceed  2.  B12 and folate deficiency --continue monthly IM B12 and daily folate 1 mg PO  3.  History of IDA --hemoglobin normal in February.  Getting oral iron 3 days a week.  Continue oral iron at current schedule

## 2023-04-03 ENCOUNTER — Ambulatory Visit: Payer: Medicare Other | Attending: Cardiology

## 2023-04-03 DIAGNOSIS — I77810 Thoracic aortic ectasia: Secondary | ICD-10-CM

## 2023-04-03 DIAGNOSIS — I5032 Chronic diastolic (congestive) heart failure: Secondary | ICD-10-CM

## 2023-04-03 DIAGNOSIS — I351 Nonrheumatic aortic (valve) insufficiency: Secondary | ICD-10-CM

## 2023-04-03 DIAGNOSIS — R55 Syncope and collapse: Secondary | ICD-10-CM

## 2023-04-03 DIAGNOSIS — I350 Nonrheumatic aortic (valve) stenosis: Secondary | ICD-10-CM | POA: Diagnosis not present

## 2023-04-03 DIAGNOSIS — R42 Dizziness and giddiness: Secondary | ICD-10-CM | POA: Diagnosis not present

## 2023-04-03 DIAGNOSIS — I447 Left bundle-branch block, unspecified: Secondary | ICD-10-CM

## 2023-04-03 HISTORY — PX: TRANSTHORACIC ECHOCARDIOGRAM: SHX275

## 2023-04-03 HISTORY — DX: Nonrheumatic aortic (valve) stenosis: I35.0

## 2023-04-03 LAB — ECHOCARDIOGRAM COMPLETE
AR max vel: 1.53 cm2
AV Area VTI: 1.58 cm2
AV Area mean vel: 1.47 cm2
AV Mean grad: 13.5 mmHg
AV Peak grad: 24 mmHg
AV Vena cont: 0.8 cm
Ao pk vel: 2.45 m/s
Area-P 1/2: 2.95 cm2
Calc EF: 55 %
S' Lateral: 3.6 cm
Single Plane A2C EF: 55.9 %
Single Plane A4C EF: 54.3 %

## 2023-05-29 ENCOUNTER — Telehealth: Payer: Self-pay

## 2023-05-29 ENCOUNTER — Other Ambulatory Visit: Payer: Self-pay

## 2023-05-29 DIAGNOSIS — R42 Dizziness and giddiness: Secondary | ICD-10-CM

## 2023-05-29 NOTE — Telephone Encounter (Signed)
Spoke with patients sister/guardian [name] regarding scheduling patient for a Myoview stress test that was ordered and never scheduled. Patient has a follow up appt with Dr. Herbie Baltimore on 8/15. [name] states she will call back once she's at home since she believed he already had an appointment scheduled but advised her that I didn't see one.  Per the scheduling team there is availability this Friday and Monday in the morning around 8 or 8:30 if they are able to come one of those days.

## 2023-06-05 ENCOUNTER — Encounter
Admission: RE | Admit: 2023-06-05 | Discharge: 2023-06-05 | Disposition: A | Discharge status: Home / Self Care | Payer: Medicare Other | Source: Ambulatory Visit | Attending: Cardiology | Admitting: Cardiology

## 2023-06-05 ENCOUNTER — Telehealth: Payer: Self-pay | Admitting: Internal Medicine

## 2023-06-05 DIAGNOSIS — R55 Syncope and collapse: Secondary | ICD-10-CM | POA: Insufficient documentation

## 2023-06-05 DIAGNOSIS — R42 Dizziness and giddiness: Secondary | ICD-10-CM | POA: Diagnosis present

## 2023-06-05 HISTORY — PX: NM GATED MYOVIEW (ARMC HX): HXRAD1856

## 2023-06-05 LAB — NM MYOCAR MULTI W/SPECT W/WALL MOTION / EF
Base ST Depression (mm): 0 mm
LV dias vol: 94 mL (ref 62–150)
LV sys vol: 37 mL
MPHR: 151 {beats}/min
Nuc Stress EF: 61 %
Peak HR: 95 {beats}/min
Percent HR: 62 %
Rest HR: 60 {beats}/min
Rest Nuclear Isotope Dose: 10.5 mCi
SDS: 0
SRS: 0
SSS: 0
ST Depression (mm): 0 mm
Stress Nuclear Isotope Dose: 31.1 mCi
TID: 0.95

## 2023-06-05 MED ORDER — TECHNETIUM TC 99M TETROFOSMIN IV KIT
30.1300 | PACK | Freq: Once | INTRAVENOUS | Status: AC | PRN
  Administered 2023-06-05: 30.13 via INTRAVENOUS

## 2023-06-05 MED ORDER — TECHNETIUM TC 99M TETROFOSMIN IV KIT
10.0000 | PACK | Freq: Once | INTRAVENOUS | Status: AC | PRN
  Administered 2023-06-05: 10.49 via INTRAVENOUS

## 2023-06-05 MED ORDER — REGADENOSON 0.4 MG/5ML IV SOLN
0.4000 mg | Freq: Once | INTRAVENOUS | Status: AC
  Administered 2023-06-05: 0.4 mg via INTRAVENOUS
  Filled 2023-06-05: qty 5

## 2023-06-05 NOTE — Telephone Encounter (Signed)
Have either of you see the letter pts sister is referring to?

## 2023-06-05 NOTE — Telephone Encounter (Signed)
Dr. Rhea Belton received the letter yesterday and signed that he agreed with the foods listed. The letter was mailed directly back to patient's sister per their request. Left a detailed message for patient's sister explaining this.

## 2023-06-05 NOTE — Telephone Encounter (Signed)
Patients sister called to ask if a letter she sent in the mail has been received. It is a list of foods he can not have for Dr. Rhea Belton to sign off on.

## 2023-06-07 ENCOUNTER — Encounter: Payer: Self-pay | Admitting: Cardiology

## 2023-06-07 ENCOUNTER — Ambulatory Visit: Payer: Medicare Other | Attending: Cardiology | Admitting: Cardiology

## 2023-06-07 VITALS — BP 136/62 | HR 66 | Ht 70.0 in | Wt 208.4 lb

## 2023-06-07 DIAGNOSIS — I5032 Chronic diastolic (congestive) heart failure: Secondary | ICD-10-CM

## 2023-06-07 DIAGNOSIS — I350 Nonrheumatic aortic (valve) stenosis: Secondary | ICD-10-CM

## 2023-06-07 DIAGNOSIS — R42 Dizziness and giddiness: Secondary | ICD-10-CM | POA: Diagnosis not present

## 2023-06-07 DIAGNOSIS — R55 Syncope and collapse: Secondary | ICD-10-CM

## 2023-06-07 DIAGNOSIS — I447 Left bundle-branch block, unspecified: Secondary | ICD-10-CM | POA: Diagnosis not present

## 2023-06-07 DIAGNOSIS — G903 Multi-system degeneration of the autonomic nervous system: Secondary | ICD-10-CM

## 2023-06-07 DIAGNOSIS — I493 Ventricular premature depolarization: Secondary | ICD-10-CM

## 2023-06-07 DIAGNOSIS — I77810 Thoracic aortic ectasia: Secondary | ICD-10-CM

## 2023-06-07 NOTE — Assessment & Plan Note (Signed)
Echo was read as mild to moderate AS, but with a gradient of only 13.5 mmHg, this does not meet criteria for moderate.  Therefore would consider mild and is stable compared to 2021 echo.  Plan will be to reassess in 2 to 3 years.

## 2023-06-07 NOTE — Assessment & Plan Note (Addendum)
Again avoid diuretic.  Maintain adequate hydration.  Continue midodrine. Continue recommend support stockings and foot elevation.  Although no arrhythmias seen, frequent PVCs noted.  Further evaluation per EP.  Heart rates in the 60s and low blood pressures preclude beta-blockers or calcium channel blockers for control of PVCs.

## 2023-06-07 NOTE — Assessment & Plan Note (Signed)
GR 1 DD.  Unlikely that he actually has diastolic heart failure.

## 2023-06-07 NOTE — Progress Notes (Signed)
Cardiology Office Note:  .   Date:  06/07/2023  ID:  [name], DOB Mar 09, 1956, MRN [id number] PCP: [name], FNP  [facility] Providers Cardiologist:  None     Chief Complaint  Patient presents with   Follow-up    Discuss cardiac testing results.  Patient denies new or acute cardiac problems/concerns today.      History of Present Illness: .     [name] is a  67 y.o. 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  He returns today at the request of [name], FNP  Patient is accompanied by his cousin-[name] who usually brings him to clinic visits. His sister who is the power of attorney does not live locally.  We were actually able to connect with the sister via telephone for her to take part in the conversation.  [name] was last seen on 02/26/2023 for initial evaluation in response to episodic positional dizziness and near syncope as well as possible orthostatic BP changes.  Clinic note indicated possible LBBB or incomplete LBBB on EKG which I did not confirm.  He had previous evaluations revealing mild aortic insufficiency and stenosis and possible dilated ascending aorta.  I ordered echocardiogram and an event monitor to evaluate for his near syncope.  Results of the echocardiogram were relatively reassuring, but the monitor suggested roughly 20 % PVCs and therefore we proceeded with a Myoview stress test.  He now presents to discuss results.    Subjective  INTERVAL HISTORY [name] returns here today overall feeling relatively stable.  Pretty much back to normal as far as and no  significant issues with positional dizziness or orthostatic symptoms.  He denies any notable sensations of rapid irregular heartbeats or palpitations.  He denies significant fluttering or irregular heartbeats.  His major issue remains back and leg pain with some sciatic pain.  As a result has become somewhat less active and somewhat deconditioned.  He may get some exertional dyspnea as a result of this.  He is somewhat debilitated at baseline as far as activity goes.  He has not had any further syncope or near syncope type symptoms.  No chest pain or pressure with rest or exertion.  No PND, orthopnea with mild baseline edema.  He does acknowledge having poor balance and dizziness.  He is now using 10 mg twice daily midodrine along with additional 5 mg at night and seems to doing okay with maintaining his hydration.   ROS:  Cardiovascular ROS: positive for - dyspnea on exertion, edema, and exercise intolerance and poor balance as well as some postural dizziness swelling is mostly unilateral. negative for - chest pain, irregular heartbeat, orthopnea, palpitations, paroxysmal nocturnal dyspnea, rapid heart rate, shortness of breath, or syncope or near syncope, TIA or emesis fugax, claudication  Review of Systems - Negative except chronic back pain with some radicular pain that she is worse than usual.  Poor balance and unsteady gait.  Somewhat deconditioned with exercise intolerance and deconditioning.  Blunted mood and affect.    Objective   Studies Reviewed: .        14-day Zio patch monitor (May-June 2024)   Predominant rhythm: Sinus (  1  AVB): HR range 43-107 bpm, Avg 61 bpm.   Frequent (~20%) PVCw with rare couplets and triplets.  Bigeminy and trigeminy about 8-9 minutes. ->  Couplets noted with both monomorphic and polymorphic beats.   Rare PACs with couplets and triplets.   1 short 4 beat run of " Nonsustained Ventricular Tachycardia" (actually PVC followed by a PVC triplet of differnet  morphology)   9 Atrial Runs: Fastest was 5 beats (2.3 sec) HR 126 739 bpm, Avg 134 bpm; longest 19 beats (11.5 sec) HR 91-109 bpm,Avg 101 bpm.   No events noted on diary   No Sustained Arrhythmias: Atrial Tachycardia (AT), Supraventricular Tachycardia (SVT), Atrial Fibrillation (A-Fib), Atrial Flutter (A-Flutter), Sustained Ventricular Tachycardia (VT)   No symptoms noted.  However 20% PVCs is somewhat concerning.  The rest of the monitor is pretty unremarkable.  Nothing to explain passing out.   ECHO 04/03/2023: LVEF 55 to 60%.  No RWMA.  GR 1 DD.  Normal RV size and function but unable to assess PAP.  Normal MV with only mild MR.  Calcified AoV with mild AI and mild AS (gradient stable at roughly 13.5 mmHg.  Normal IVC-RAP  Myoview 06/05/2023:    Normal pharmacologic myocardial perfusion stress test without evidence of significant ischemia or scar.   Left ventricular systolic function is normal by visual estimation and Siemens calculation (LVEF 61%).   Calcifications near the aortic valve annulus and ostium of the left main coronary artery are noted.   This is a low-risk study.  Risk Assessment/Calculations:            Physical Exam:   VS:  BP 136/62   Pulse 66   Ht 5\' 10"  (1.778 m)   Wt 208 lb 6.4 oz (94.5 kg)   SpO2 99%   BMI 29.90 kg/m    Wt Readings from Last 3 Encounters:  06/07/23 208 lb 6.4 oz (94.5 kg)  04/02/23 207 lb (93.9 kg)  02/26/23 214 lb 12.8 oz (97.4 kg)    GEN: Well nourished, well developed in no acute distress; sitting in wheelchair.  Somewhat slow cognition.  Borderline obese NECK: No JVD; No carotid bruits CARDIAC: Normal S1, S2; RRR, no rubs, gallops; harsh 2/6 SEM at RUSB-neck. RESPIRATORY:  Clear to auscultation without rales, wheezing or rhonchi ; nonlabored, good air movement. ABDOMEN: Soft, non-tender, non-distended EXTREMITIES: Trivial mostly left lower extremity edema; No deformity  NEURO: Slow cognition.  Blunted mood/affect.    ASSESSMENT AND  PLAN: .    Problem List Items Addressed This Visit       Cardiology Problems   Postural dizziness with near syncope (Chronic)    Again avoid diuretic.  Maintain adequate hydration.  Continue midodrine. Continue recommend support stockings and foot elevation.  Although no arrhythmias seen, frequent PVCs noted.  Further evaluation per EP.  Heart rates in the 60s and low blood pressures preclude beta-blockers or calcium channel blockers for control of PVCs.      Nonrheumatic aortic valve stenosis - Primary (Chronic)    Echo was read as mild to moderate AS, but with a gradient of only 13.5 mmHg, this does not meet criteria for moderate.  Therefore would consider mild and is stable compared to 2021 echo.  Plan will be to reassess in 2 to 3 years.      Hypotension    Probably associated with his postural dizziness.  He is on midodrine.  Continue to encourage adequate hydration.  Would like to avoid diuretics, afterload  reduction and even a potential blood pressure medications to prevent exacerbation.      Frequent PVCs (Chronic)    Monitor with roughly 20% PVCs noted thankfully, echocardiogram is relatively reassuring as is the Myoview stress test.  Most likely we will want to consider additional evaluation, however I would prefer to have him evaluated by EP to determine if indeed something such as a cardiac MRI would be the best next option.  Need to determine if there is potential etiology for the PVCs or treatment options.  Do not have a lot of blood pressure or heart rate room to work with the use of beta-blockers.  Will therefore defer to EP to assist with management.      Relevant Orders   Ambulatory referral to Cardiac Electrophysiology   Dilation of thoracic aorta (HCC)    No comment on dilated aorta on current echocardiogram.  Continue to monitor with routine echoes in 2 to 3 years.      Diastolic CHF, chronic (HCC) (Chronic)    GR 1 DD.  Unlikely that he actually has diastolic  heart failure.        Other   Left bundle branch block (LBBB) on electrocardiogram    LBBB not seen on EKG.  Echo does not show regional wall motion abnormalities consistent with LV either.             Dispo: Return in about 1 year (around 06/06/2024) for 1 Yr Follow-up, Follow-up in [place].  Referred to EP.  Will follow-up in 1 year.  Total time spent: 18 min spent with patient + 24 min spent charting = 42 min     Signed, Marykay Lex, MD, MS Bryan Lemma, M.D., M.S. Interventional Cardiologist  [facility]  Pager # [phone] Phone # [phone] [address] [place], [place] [address]

## 2023-06-07 NOTE — Assessment & Plan Note (Signed)
Monitor with roughly 20% PVCs noted thankfully, echocardiogram is relatively reassuring as is the Myoview stress test.  Most likely we will want to consider additional evaluation, however I would prefer to have him evaluated by EP to determine if indeed something such as a cardiac MRI would be the best next option.  Need to determine if there is potential etiology for the PVCs or treatment options.  Do not have a lot of blood pressure or heart rate room to work with the use of beta-blockers.  Will therefore defer to EP to assist with management.

## 2023-06-07 NOTE — Patient Instructions (Signed)
Medication Instructions:  Your physician recommends that you continue on your current medications as directed. Please refer to the Current Medication list given to you today.   *If you need a refill on your cardiac medications before your next appointment, please call your pharmacy*   Lab Work: none If you have labs (blood work) drawn today and your tests are completely normal, you will receive your results only by: MyChart Message (if you have MyChart) OR A paper copy in the mail If you have any lab test that is abnormal or we need to change your treatment, we will call you to review the results.   Testing/Procedures: none   Follow-Up: At [facility], you and your health needs are our priority.  As part of our continuing mission to provide you with exceptional heart care, we have created designated Provider Care Teams.  These Care Teams include your primary Cardiologist (physician) and Advanced Practice Providers (APPs -  Physician Assistants and Nurse Practitioners) who all work together to provide you with the care you need, when you need it.  We recommend signing up for the patient portal called "MyChart".  Sign up information is provided on this After Visit Summary.  MyChart is used to connect with patients for Virtual Visits (Telemedicine).  Patients are able to view lab/test results, encounter notes, upcoming appointments, etc.  Non-urgent messages can be sent to your provider as well.   To learn more about what you can do with MyChart, go to [email or web address].    Your next appointment:   1 year(s)  Provider:   Bryan Lemma, MD   Other Instructions Your provider has a placed a referral to Electrophysiology

## 2023-06-07 NOTE — Assessment & Plan Note (Signed)
No comment on dilated aorta on current echocardiogram.  Continue to monitor with routine echoes in 2 to 3 years.

## 2023-06-07 NOTE — Assessment & Plan Note (Signed)
Probably associated with his postural dizziness.  He is on midodrine.  Continue to encourage adequate hydration.  Would like to avoid diuretics, afterload reduction and even a potential blood pressure medications to prevent exacerbation.

## 2023-06-07 NOTE — Assessment & Plan Note (Signed)
LBBB not seen on EKG.  Echo does not show regional wall motion abnormalities consistent with LV either.

## 2023-06-14 ENCOUNTER — Encounter: Payer: Self-pay | Admitting: Internal Medicine

## 2023-06-23 ENCOUNTER — Encounter: Payer: Self-pay | Admitting: Certified Registered Nurse Anesthetist

## 2023-06-26 ENCOUNTER — Encounter: Payer: Self-pay | Admitting: Internal Medicine

## 2023-06-26 ENCOUNTER — Ambulatory Visit (AMBULATORY_SURGERY_CENTER): Payer: Medicare Other | Admitting: Internal Medicine

## 2023-06-26 VITALS — BP 131/61 | HR 66 | Temp 97.1°F | Resp 12 | Ht 70.0 in | Wt 207.0 lb

## 2023-06-26 DIAGNOSIS — K50819 Crohn's disease of both small and large intestine with unspecified complications: Secondary | ICD-10-CM

## 2023-06-26 MED ORDER — SODIUM CHLORIDE 0.9 % IV SOLN: 500.0000 mL | Freq: Once | INTRAVENOUS | Status: DC

## 2023-06-26 NOTE — Progress Notes (Signed)
Called to room to assist during endoscopic procedure.  Patient ID and intended procedure confirmed with present staff. Received instructions for my participation in the procedure from the performing physician.  

## 2023-06-26 NOTE — Progress Notes (Signed)
Report given to PACU, vss 

## 2023-06-26 NOTE — Progress Notes (Signed)
GASTROENTEROLOGY PROCEDURE H&P NOTE   Primary Care Physician: [name], FNP    Reason for Procedure:  Longstanding Crohn's disease of the small and large intestine  Plan:    Colonoscopy  Patient is appropriate for endoscopic procedure(s) in the ambulatory ([facility]) setting.  The nature of the procedure, as well as the risks, benefits, and alternatives were carefully and thoroughly reviewed with the patient. Ample time for discussion and questions allowed. The patient understood, was satisfied, and agreed to proceed.     HPI: [name] is a 67 y.o. male who presents for colonoscopy.  Medical history as below.  Tolerated the prep.  No recent chest pain or shortness of breath.  No abdominal pain today.  Past Medical History:  Diagnosis Date   Abscess of anal and rectal regions    Allergic rhinitis    Allergy    Aortic valve insufficiency    non-rheumatic    BPH (benign prostatic hyperplasia)    CHF (congestive heart failure) (HCC)     chronic diastolic heart failure    Cholelithiasis    Crohn's disease (HCC)    Dizziness and giddiness    DVT (deep venous thrombosis) (HCC)    Dysphagia    Epilepsy (HCC)    "petit mal; only at home" (6/9//2015)   Heart murmur    Hepatomegaly    History of blood transfusion ~ 1992   "related to my Crohn's"   History of immunosuppression therapy    History of stomach ulcers    Hyperplastic colon polyp    Hypokalemia    hx of    Iron deficiency anemia, unspecified    iron deficiency anemia    Malaise    Mental disability    Mild aortic stenosis by prior echocardiography 04/03/2023   Calcified AoV with mild AI and AS; stable ABG abruptly 13.5 mmHg.   Muscle weakness (generalized)    Other B-complex deficiencies    REGIONAL ENTERITIS, LARGE INTESTINE 10/05/2000   Qualifier: Diagnosis of  By: Thereasa Solo     S/P partial colectomy for Crohn's disease 12/01/2015   SBO (small bowel obstruction) (HCC) 12/01/2015    Sepsis (HCC)    hx of    Severe protein-calorie malnutrition Lily Kocher: less than 60% of standard weight) (HCC)    Syncope 03/31/2014   Syncope and collapse 03/31/2014   "while driving"   Tremor    Unsteadiness on feet    Urticaria    Vitamin D deficiency     Past Surgical History:  Procedure Laterality Date   14-day Zio patch  02/2023   ~20% PVCs with bigeminy and trigeminy as well as couplets and triplets noted.  Monomorphic and polymorphic beats.  Rare PACs.  1 short run of 4 PVCs.  9 atrial runs from 5-19 beats.  No sustained arrhythmias.   CHOLECYSTECTOMY N/A 06/06/2018   Procedure: LAPAROSCOPIC CHOLECYSTECTOMY WITH INTRAOPERATIVE CHOLANGIOGRAM ERAS PATHWAY;  Surgeon: Ovidio Kin, MD;  Location: [facility];  Service: General;  Laterality: N/A;   COLON SURGERY     Anastomosis 45cm from anal verge c/w deswecending colon anastomosis   COLONOSCOPY  2013, 2016   COLOSTOMY TAKEDOWN N/A 08/01/2016   Procedure: LAPAROSCOPIC COLOSTOMY REVERSAL;  Surgeon: Ovidio Kin, MD;  Location: [facility];  Service: General;  Laterality: N/A;   HEMICOLECTOMY  10/24/1983   ileocectomy    HERNIA REPAIR     LAPAROSCOPY N/A 12/04/2015   Procedure: LAPAROSCOPY DIAGNOSTIC, LAPAROSCOPIC INTEROLYSIS OF ADHESIONS, HAND- ASSISTED SMALL BOWEL RESECTION, MOBILIZATION  OF SPLENIC FLEXURE, LEFT COLECTOMY WITH CREATION OF COLOSTOMY (HARTMAN'S PROCEDURE);  Surgeon: Ovidio Kin, MD;  Location: [facility];  Service: General;  Laterality: N/A;   NM GATED MYOVIEW ([facility] HX)  06/05/2023   LVEF roughly 60%.  No ischemia or infarction.  LOW RISK.  Aortic valve annular and ostial calcification as well as LM calcification.   PILONIDAL CYST EXCISION  06/24/1979   TONSILLECTOMY  ~ 1967   TRANSTHORACIC ECHOCARDIOGRAM  04/03/2023   LVEF 55 to 60%.  No RWMA.  GR 1 DD.  Normal RV size and function but unable to assess PAP.  Normal MV with only mild MR.  Calcified AoV with mild AI and mild AS (gradient stable at roughly 13.5 mmHg.  Normal  IVC-RAP   UMBILICAL HERNIA REPAIR  ~ 1983    Prior to Admission medications   Medication Sig Start Date End Date Taking? Authorizing Provider  azaTHIOprine (IMURAN) 50 MG tablet Take 1 tablet (50 mg total) by mouth daily. 08/10/20  Yes Kenzleigh Sedam, Carie Caddy, MD  cholecalciferol (VITAMIN D3) 25 MCG (1000 UNIT) tablet Take 1,000 Units by mouth daily.   Yes [provider]  ferrous sulfate 325 (65 FE) MG tablet Take 325 mg by mouth daily with breakfast. Mon and Th   Yes [provider]  fluticasone (FLONASE) 50 MCG/ACT nasal spray Place into both nostrils daily.   Yes [provider]  folic acid (FOLVITE) 1 MG tablet Take 1 mg by mouth daily.   Yes [provider]  levETIRAcetam (KEPPRA) 500 MG tablet Take 1 tablet daily 02/23/23  Yes Van Clines, MD  mesalamine (PENTASA) 500 MG CR capsule Take 2 capsules (1,000 mg total) by mouth 4 (four) times daily. Opened in applesauce 05/18/22  Yes Ulah Olmo, Carie Caddy, MD  midodrine (PROAMATINE) 10 MG tablet Take 1 tablet by mouth 2 (two) times daily. 06/03/21  Yes [provider]  midodrine (PROAMATINE) 5 MG tablet Take 5 mg by mouth at bedtime. 05/21/21  Yes [provider]  acetaminophen (TYLENOL) 325 MG tablet Take 650 mg by mouth every 6 (six) hours as needed.    [provider]  acetaminophen (TYLENOL) 500 MG tablet Take 500 mg by mouth at bedtime as needed.    [provider]  cetaphil (CETAPHIL) lotion Apply 1 application topically at bedtime. Apply to arms,legs,torso topically at bedtime for dry skin    [provider]  Cyanocobalamin (VITAMIN B-12 IJ) Inject 1 mL as directed every 30 (thirty) days.    [provider]  famotidine (PEPCID) 20 MG tablet Take 20 mg by mouth 2 (two) times daily.    [provider]  HYDROcodone-acetaminophen (NORCO/VICODIN) 5-325 MG tablet Take 1 tablet by mouth every 6 (six) hours as needed. 01/08/23   [provider]  Infant Care  Products [facility]) OINT Apply topically. Apply to sacrum topically every 8 hours as needed for redness/irritation    [provider]  loperamide (IMODIUM) 2 MG capsule Take 2 mg by mouth as needed for diarrhea or loose stools.    [provider]  loratadine (CLARITIN) 10 MG tablet Take 10 mg by mouth daily.    [provider]  primidone (MYSOLINE) 250 MG tablet Take 1 tab in AM, 2 tabs in PM 02/23/23   Van Clines, MD  PSYLLIUM HUSK PO Take by mouth. Patient not taking: Reported on 06/07/2023    [provider]  tamsulosin (FLOMAX) 0.4 MG CAPS capsule Take 1 capsule (0.4 mg  total) by mouth daily. 04/12/18   Margarita Grizzle, MD  triamcinolone cream (KENALOG) 0.1 % Apply 1 application  topically 2 (two) times daily.    [provider]  ustekinumab (STELARA) 45 MG/0.5ML SOSY injection Inject 0.5 mLs (45 mg total) into the skin every 8 (eight) weeks. 08/11/21   Isaias Dowson, Carie Caddy, MD  Vitamin D, Ergocalciferol, (DRISDOL) 1.25 MG (50000 UT) CAPS capsule Take 50,000 Units by mouth every 30 (thirty) days. Due 01-04-21 Patient not taking: Reported on 06/07/2023    [provider]    Current Outpatient Medications  Medication Sig Dispense Refill   azaTHIOprine (IMURAN) 50 MG tablet Take 1 tablet (50 mg total) by mouth daily. 30 tablet 3   cholecalciferol (VITAMIN D3) 25 MCG (1000 UNIT) tablet Take 1,000 Units by mouth daily.     ferrous sulfate 325 (65 FE) MG tablet Take 325 mg by mouth daily with breakfast. Mon and Th     fluticasone (FLONASE) 50 MCG/ACT nasal spray Place into both nostrils daily.     folic acid (FOLVITE) 1 MG tablet Take 1 mg by mouth daily.     levETIRAcetam (KEPPRA) 500 MG tablet Take 1 tablet daily 90 tablet 3   mesalamine (PENTASA) 500 MG CR capsule Take 2 capsules (1,000 mg total) by mouth 4 (four) times daily. Opened in applesauce 240 capsule 3   midodrine (PROAMATINE) 10 MG tablet Take 1 tablet by mouth 2 (two) times daily.      midodrine (PROAMATINE) 5 MG tablet Take 5 mg by mouth at bedtime.     acetaminophen (TYLENOL) 325 MG tablet Take 650 mg by mouth every 6 (six) hours as needed.     acetaminophen (TYLENOL) 500 MG tablet Take 500 mg by mouth at bedtime as needed.     cetaphil (CETAPHIL) lotion Apply 1 application topically at bedtime. Apply to arms,legs,torso topically at bedtime for dry skin     Cyanocobalamin (VITAMIN B-12 IJ) Inject 1 mL as directed every 30 (thirty) days.     famotidine (PEPCID) 20 MG tablet Take 20 mg by mouth 2 (two) times daily.     HYDROcodone-acetaminophen (NORCO/VICODIN) 5-325 MG tablet Take 1 tablet by mouth every 6 (six) hours as needed.     Infant Care Products [facility]) OINT Apply topically. Apply to sacrum topically every 8 hours as needed for redness/irritation     loperamide (IMODIUM) 2 MG capsule Take 2 mg by mouth as needed for diarrhea or loose stools.     loratadine (CLARITIN) 10 MG tablet Take 10 mg by mouth daily.     primidone (MYSOLINE) 250 MG tablet Take 1 tab in AM, 2 tabs in PM 270 tablet 3   PSYLLIUM HUSK PO Take by mouth. (Patient not taking: Reported on 06/07/2023)     tamsulosin (FLOMAX) 0.4 MG CAPS capsule Take 1 capsule (0.4 mg total) by mouth daily. 30 capsule 30   triamcinolone cream (KENALOG) 0.1 % Apply 1 application  topically 2 (two) times daily.     ustekinumab (STELARA) 45 MG/0.5ML SOSY injection Inject 0.5 mLs (45 mg total) into the skin every 8 (eight) weeks. 0.5 mL 6   Vitamin D, Ergocalciferol, (DRISDOL) 1.25 MG (50000 UT) CAPS capsule Take 50,000 Units by mouth every 30 (thirty) days. Due 01-04-21 (Patient not taking: Reported on 06/07/2023)     Current Facility-Administered Medications  Medication Dose Route Frequency Provider Last Rate Last Admin   0.9 %  sodium chloride infusion  500 mL Intravenous Once Ehsan Corvin, Carie Caddy,  MD        Allergies as of 06/26/2023 - Review Complete 06/26/2023  Allergen Reaction Noted   Darvon [propoxyphene] Other (See  Comments) 04/19/2018   Other Other (See Comments) 05/16/2017    Family History  Problem Relation Age of Onset   Skin cancer Father    Colon cancer Mother    Dementia Mother    Seizures Neg Hx    Crohn's disease Neg Hx    Esophageal cancer Neg Hx    Rectal cancer Neg Hx    Stomach cancer Neg Hx     Social History   Socioeconomic History   Marital status: Single    Spouse name: Not on file   Number of children: 0   Years of education: Not on file   Highest education level: Not on file  Occupational History   Occupation: Disabled  Tobacco Use   Smoking status: Never   Smokeless tobacco: Never  Vaping Use   Vaping status: Never Used  Substance and Sexual Activity   Alcohol use: Never   Drug use: Never   Sexual activity: Never  Other Topics Concern   Not on file  Social History Narrative   Lives at [facility] [phone]   Right handed    Social Determinants of Health   Financial Resource Strain: Not on file  Food Insecurity: Not on file  Transportation Needs: Not on file  Physical Activity: Not on file  Stress: Not on file  Social Connections: Not on file  Intimate Partner Violence: Not on file    Physical Exam: Vital signs in last 24 hours: @BP  132/68   Pulse 66   Temp (!) 97.1 F (36.2 C)   Ht 5\' 10"  (1.778 m)   Wt 207 lb (93.9 kg)   SpO2 99%   BMI 29.70 kg/m  GEN: NAD EYE: Sclerae anicteric ENT: MMM CV: Non-tachycardic Pulm: CTA b/l GI: Soft, NT/ND NEURO:  Alert & Oriented x 3   Erick Blinks, MD [facility]  06/26/2023 2:00 PM

## 2023-06-26 NOTE — Op Note (Signed)
[facility] Patient Name: [name] Procedure Date: 06/26/2023 2:00 PM MRN: [id number] Endoscopist: Beverley Fiedler , MD, [id number] Age: 67 Referring MD:  Date of Birth: January 15, 1956 Gender: Male Account #: [id number] Procedure:                Colonoscopy Indications:              Crohn's disease of the small bowel and colon;                            (Longstanding Crohn's disease dating back to 1985,                            previous ileocecectomy, previous left colon                            resection, previously on Stelara since February                            2022 + azathioprine and Pentasa); last exam                            December 2021 Medicines:                Monitored Anesthesia Care Procedure:                Pre-Anesthesia Assessment:                           - Prior to the procedure, a History and Physical                            was performed, and patient medications and                            allergies were reviewed. The patient's tolerance of                            previous anesthesia was also reviewed. The risks                            and benefits of the procedure and the sedation                            options and risks were discussed with the patient.                            All questions were answered, and informed consent                            was obtained. Prior Anticoagulants: The patient has                            taken no anticoagulant or antiplatelet agents. ASA  Grade Assessment: III - A patient with severe                            systemic disease. After reviewing the risks and                            benefits, the patient was deemed in satisfactory                            condition to undergo the procedure.                           After obtaining informed consent, the colonoscope                            was passed under direct vision. Throughout the                             procedure, the patient's blood pressure, pulse, and                            oxygen saturations were monitored continuously. The                            Olympus PCF-H190DL (#[id number]) Colonoscope was                            introduced through the anus and advanced to the                            terminal ileum. The colonoscopy was performed                            without difficulty. The patient tolerated the                            procedure well. The quality of the bowel                            preparation was good. The terminal ileum, ileocecal                            valve, appendiceal orifice, and rectum were                            photographed. Scope In: 2:10:19 PM Scope Out: 2:20:43 PM Scope Withdrawal Time: 0 hours 8 minutes 32 seconds  Total Procedure Duration: 0 hours 10 minutes 24 seconds  Findings:                 The digital rectal exam was normal.                           The neo-terminal ileum appeared normal.  There was evidence of a prior end-to-side                            ileo-colonic anastomosis in the ascending colon.                            This was patent and was characterized by healthy                            appearing mucosa.                           There was evidence of a prior end-to-end                            colo-colonic anastomosis in the sigmoid colon. This                            was patent and was characterized by healthy                            appearing mucosa.                           The Simple Endoscopic Score for Crohn's Disease was                            determined based on the endoscopic appearance of                            the mucosa in the following segments:                           - Ileum: Findings include no ulcers present, no                            ulcerated surfaces, no affected surfaces and no                            narrowings. Segment  score: 0.                           - Right Colon: Findings include no ulcers present,                            no ulcerated surfaces, no affected surfaces and no                            narrowings. Segment score: 0.                           - Transverse Colon: Findings include no ulcers                            present, no ulcerated surfaces, no  affected                            surfaces and no narrowings. Segment score: 0.                           - Left Colon: Findings include no ulcers present,                            no ulcerated surfaces, no affected surfaces and no                            narrowings. Segment score: 0.                           - Rectum: Findings include no ulcers present, no                            ulcerated surfaces, no affected surfaces and no                            narrowings. Segment score: 0.                           - Total SES-CD aggregate score: 0. Biopsies were                            taken with a cold forceps for histology.                           The retroflexed view of the distal rectum and anal                            verge was normal and showed no anal or rectal                            abnormalities. Complications:            No immediate complications. Estimated Blood Loss:     Estimated blood loss was minimal. Impression:               - The examined portion of the ileum was normal.                           - Patent end-to-side ileo-colonic anastomosis,                            characterized by healthy appearing mucosa.                           - Patent end-to-end colo-colonic anastomosis,                            characterized by healthy appearing mucosa.                           -  Simple Endoscopic Score for Crohn's Disease: 0,                            mucosal inflammatory changes secondary to Crohn's                            disease, in remission. Biopsied.                           - The distal  rectum and anal verge are normal on                            retroflexion view. Recommendation:           - Patient has a contact number available for                            emergencies. The signs and symptoms of potential                            delayed complications were discussed with the                            patient. Return to normal activities tomorrow.                            Written discharge instructions were provided to the                            patient.                           - Resume previous diet.                           - Continue present medications.                           - Await pathology results.                           - Repeat colonoscopy is recommended for                            surveillance. The colonoscopy date will be                            determined after pathology results from today's                            exam become available for review. Beverley Fiedler, MD 06/26/2023 2:32:22 PM This report has been signed electronically.

## 2023-06-26 NOTE — Patient Instructions (Signed)
Resume all of your previous medications today.  Give the handouts and instructions to your care-partner in your group home.  YOU HAD AN ENDOSCOPIC PROCEDURE TODAY AT THE [facility]:   Refer to the procedure report that was given to you for any specific questions about what was found during the examination.  If the procedure report does not answer your questions, please call your gastroenterologist to clarify.  If you requested that your care partner not be given the details of your procedure findings, then the procedure report has been included in a sealed envelope for you to review at your convenience later.  YOU SHOULD EXPECT: Some feelings of bloating in the abdomen. Passage of more gas than usual.  Walking can help get rid of the air that was put into your GI tract during the procedure and reduce the bloating. If you had a lower endoscopy (such as a colonoscopy or flexible sigmoidoscopy) you may notice spotting of blood in your stool or on the toilet paper. If you underwent a bowel prep for your procedure, you may not have a normal bowel movement for a few days.  Please Note:  You might notice some irritation and congestion in your nose or some drainage.  This is from the oxygen used during your procedure.  There is no need for concern and it should clear up in a day or so.  SYMPTOMS TO REPORT IMMEDIATELY:  Following lower endoscopy (colonoscopy or flexible sigmoidoscopy):  Excessive amounts of blood in the stool  Significant tenderness or worsening of abdominal pains  Swelling of the abdomen that is new, acute  Fever of 100F or higher   For urgent or emergent issues, a gastroenterologist can be reached at any hour by calling [phone]. Do not use MyChart messaging for urgent concerns.    DIET:  We do recommend a small meal at first, but then you may proceed to your regular diet.  Drink plenty of fluids but you should avoid alcoholic beverages for 24 hours.  ACTIVITY:   You should plan to take it easy for the rest of today and you should NOT DRIVE or use heavy machinery until tomorrow (because of the sedation medicines used during the test).    FOLLOW UP: Our staff will call the number listed on your records the next business day following your procedure.  We will call around 7:15- 8:00 am to check on you and address any questions or concerns that you may have regarding the information given to you following your procedure. If we do not reach you, we will leave a message.     If any biopsies were taken you will be contacted by phone or by letter within the next 1-3 weeks.  Please call us at [phone] if you have not heard about the biopsies in 3 weeks.    SIGNATURES/CONFIDENTIALITY: You and/or your care partner have signed paperwork which will be entered into your electronic medical record.  These signatures attest to the fact that that the information above on your After Visit Summary has been reviewed and is understood.  Full responsibility of the confidentiality of this discharge information lies with you and/or your care-partner.

## 2023-06-27 ENCOUNTER — Telehealth: Payer: Self-pay

## 2023-06-27 NOTE — Telephone Encounter (Signed)
  Follow up Call-     06/26/2023    1:14 PM  Call back number  Post procedure Call Back phone  # [phone] [name]  Permission to leave phone message Yes     Patient questions:  Do you have a fever, pain , or abdominal swelling? No. Pain Score  0 *  Have you tolerated food without any problems? Yes.    Have you been able to return to your normal activities? Yes.    Do you have any questions about your discharge instructions: Diet   No. Medications  No. Follow up visit  No.  Do you have questions or concerns about your Care? No.  Actions: * If pain score is 4 or above: No action needed, pain <4.

## 2023-06-29 ENCOUNTER — Encounter: Payer: Self-pay | Admitting: Internal Medicine

## 2023-07-24 ENCOUNTER — Telehealth: Payer: Self-pay | Admitting: Internal Medicine

## 2023-07-24 NOTE — Telephone Encounter (Signed)
8 weeks from today

## 2023-07-24 NOTE — Telephone Encounter (Signed)
Spoke with Trey Paula RN and he is aware that next Stelara needs to be given 8 weeks from today. He will let pts sister know and the assisted living facility so they will have the medication there on time.

## 2023-07-24 NOTE — Telephone Encounter (Signed)
[name]'s assisted living was late getting the Stelara on the pt. He was supposed to receive it on 07/13/23 and just got it today. Does he need the next injection 8 weeks from 07/13/23 or 8 weeks from today. Nurse with [facility] needs to know when Dr. Rhea Belton would like him to get the next injection. Please advise.

## 2023-07-24 NOTE — Telephone Encounter (Signed)
Trey Paula, RN at assisted living is calling to advise that he gave PT Stelara injection today and he will get it again in 8 weeks. Please call Trey Paula at [phone]

## 2023-07-30 ENCOUNTER — Encounter: Payer: Self-pay | Admitting: Podiatry

## 2023-07-30 ENCOUNTER — Ambulatory Visit (INDEPENDENT_AMBULATORY_CARE_PROVIDER_SITE_OTHER): Payer: Medicare Other | Admitting: Podiatry

## 2023-07-30 DIAGNOSIS — M79674 Pain in right toe(s): Secondary | ICD-10-CM | POA: Diagnosis not present

## 2023-07-30 DIAGNOSIS — B351 Tinea unguium: Secondary | ICD-10-CM | POA: Diagnosis not present

## 2023-07-30 DIAGNOSIS — M79675 Pain in left toe(s): Secondary | ICD-10-CM

## 2023-08-01 ENCOUNTER — Institutional Professional Consult (permissible substitution): Payer: Medicare Other | Admitting: Cardiology

## 2023-08-04 NOTE — Progress Notes (Signed)
Subjective:  Patient ID: [name], male    DOB: 1956-09-25,  MRN: [id number]  67 y.o. male presents to clinic today with at risk foot care with h/o clotting disorder and painful elongated mycotic toenails 1-5 bilaterally which are tender when wearing enclosed shoe gear. Pain is relieved with periodic professional debridement.  Patient resides at: [facility]. He is accompanied by staff member, [name].  New pedal problem(s): None   PCP is Jonathan Florida, [name]  Allergies  Allergen Reactions   Darvon [Propoxyphene] Other (See Comments)    Unknown reaction   Other Other (See Comments)    Ask    Review of Systems: Negative except as noted in the HPI.   Objective:  [name] is a pleasant 67 y.o. male in NAD.[name]   Vascular Examination: Vascular status intact b/l with palpable pedal pulses. CFT immediate b/l. No edema. No pain with calf compression b/l. Skin temperature gradient WNL b/l.   Neurological Examination: Patient unable to complete sensory examination due to cognition but does respond to external noxious stimuli.   Sensation grossly intact b/l with 10 gram monofilament. Vibratory sensation intact b/l.   Dermatological Examination: Pedal skin with normal turgor, texture and tone b/l. Toenails 1-5 b/l thick, discolored, elongated with subungual debris and pain on dorsal palpation. No hyperkeratotic lesions noted b/l. No corns, calluses nor porokeratotic lesions noted.  Musculoskeletal Examination: Muscle strength 5/5 to b/l LE. No pain, crepitus or joint limitation noted with ROM bilateral LE. No gross bony deformities bilaterally.  Radiographs: None  Last A1c:       No data to display           Assessment:   1. Pain due to onychomycosis of toenails of both feet    Plan:  Patient was evaluated and treated. All patient's and/or POA's questions/concerns addressed on today's visit. Mycotic toenails 1-5 debrided in length and girth  without incident. Continue soft, supportive shoe gear daily. Report any pedal injuries to medical professional. Call office if there are any quesitons/concerns. -Patient/POA to call should there be question/concern in the interim.  Return in about 10 weeks (around 10/08/2023).  Freddie Breech, DPM

## 2023-08-28 ENCOUNTER — Telehealth: Payer: Self-pay | Admitting: Neurology

## 2023-08-28 ENCOUNTER — Telehealth: Payer: Self-pay | Admitting: Internal Medicine

## 2023-08-28 NOTE — Telephone Encounter (Signed)
Spoke with pts sister and she is aware of Dr. Lauro Franklin comments.

## 2023-08-28 NOTE — Telephone Encounter (Signed)
Pts sister states the eye doctor wants to start [name] on Areds 2 Preservision BID. She wants to make sure it is ok for him to take with his biologic. Please advise.

## 2023-08-28 NOTE — Telephone Encounter (Signed)
Spoke with pt sister informed her that Dr Karel Jarvis stated there should be no issue with his seizure medications and the preservision

## 2023-08-28 NOTE — Telephone Encounter (Signed)
This product is fine for the patient [name]

## 2023-08-28 NOTE — Telephone Encounter (Signed)
Caller states pt was prescribed a high dosage of PreserVision from another doctors office.  Patients sister is requesting a call to discuss if the medication will interfere with his other medications Karel Jarvis has him on.

## 2023-08-28 NOTE — Telephone Encounter (Signed)
They are very cautious about all his medications. Agree, pls let them know there should be no issue with his seizure medications. Thanks

## 2023-08-28 NOTE — Telephone Encounter (Signed)
Inbound call from patients sister stating that patient has been put on PreserVision. Patients sister is requesting a call to discuss if the medication will interact with his Crohns medication. Please advise.

## 2023-09-04 NOTE — Progress Notes (Unsigned)
  Electrophysiology Office Note:    Date:  09/05/2023   ID:  [name], DOB [birth year], MRN [id number]  [facility] Cardiologist:  None  [facility] Electrophysiologist:  Jonathan Prude, MD   Referring MD: Jonathan Lex, MD   Chief Complaint: PVCs  History of Present Illness:      Discussed the use of AI scribe software for clinical note transcription with the patient, who gave verbal consent to proceed.  History of Present Illness   [name], a 67 year old with a history of dilated thoracic aorta, left lower extremity DVT, and chronic diastolic heart failure, was referred for evaluation of frequent PVCs. He was previously seen by another provider for postural dizziness and near syncope, which improved with midodrine. He denied experiencing palpitations or further episodes of syncope. He lives in an assisted living facility and has been experiencing stress due to issues with roommates.               Their past medical, social and family history was reveiwed.   ROS:   Please see the history of present illness.    All other systems reviewed and are negative.  EKGs/Labs/Other Studies Reviewed:    The following studies were reviewed today:  04/03/2023 Echo EF 55 RV normal Mild MR Mild AI/AS  05/12/2023 Zio 20% PVC HR 43 - 139, average 61  06/05/2023 ECG Sinus with frequent monomorphic PVC PVC are inferior with QRS transition in V3  EKG Interpretation Date/Time:  Wednesday September 05 2023 11:32:36 EST Ventricular Rate:  59 PR Interval:  244 QRS Duration:  90 QT Interval:  448 QTC Calculation: 443 R Axis:   5  Text Interpretation: Sinus bradycardia with 1st degree A-V block Confirmed by [name] [phone]) on 09/05/2023 11:59:13 AM    Physical Exam:    VS:  BP (!) 140/60 (BP Location: Left Arm, Patient Position: Sitting, Cuff Size: Normal)   Pulse (!) 59   Ht 5\' 10"  (1.778 m)   Wt 213 lb (96.6 kg)   SpO2 98%   BMI 30.56 kg/m      Wt Readings from Last 3 Encounters:  09/05/23 213 lb (96.6 kg)  06/26/23 207 lb (93.9 kg)  06/07/23 208 lb 6.4 oz (94.5 kg)     Physical Exam   Gen: NAD in wheelchair CARDIOVASCULAR: Heart sounds normal upon auscultation. RESP: No IWOB. Lungs CTAB.           ASSESSMENT AND PLAN:    1. Frequent PVCs   2. Left bundle branch block (LBBB) on electrocardiogram       Assessment and Plan    Premature Ventricular Contractions (PVCs) Asymptomatic with normal ejection fraction. No indication for antiarrhythmic drugs or invasive EP procedures. Discussed possible future treatment options if left ventricular dysfunction develops.  Postural Dizziness and Orthostasis Improvement noted with midodrine. -Continue midodrine as prescribed.  Follow-up as needed with primary cardiologist, Dr. Herbie [name].         Signed, [name]. Jonathan Brothers, MD, [facility], [facility] 09/05/2023 12:06 PM    Electrophysiology [facility]

## 2023-09-05 ENCOUNTER — Encounter: Payer: Self-pay | Admitting: Cardiology

## 2023-09-05 ENCOUNTER — Ambulatory Visit: Payer: Medicare Other | Attending: Cardiology | Admitting: Cardiology

## 2023-09-05 VITALS — BP 140/60 | HR 59 | Ht 70.0 in | Wt 213.0 lb

## 2023-09-05 DIAGNOSIS — I493 Ventricular premature depolarization: Secondary | ICD-10-CM

## 2023-09-05 DIAGNOSIS — I447 Left bundle-branch block, unspecified: Secondary | ICD-10-CM

## 2023-09-05 NOTE — Patient Instructions (Signed)
Medication Instructions:  Your physician recommends that you continue on your current medications as directed. Please refer to the Current Medication list given to you today.  *If you need a refill on your cardiac medications before your next appointment, please call your pharmacy*  Follow-Up: At [facility], you and your health needs are our priority.  As part of our continuing mission to provide you with exceptional heart care, we have created designated Provider Care Teams.  These Care Teams include your primary Cardiologist (physician) and Advanced Practice Providers (APPs -  Physician Assistants and Nurse Practitioners) who all work together to provide you with the care you need, when you need it.   Your next appointment:   Follow up as needed with Dr. Lalla Brothers

## 2023-10-08 ENCOUNTER — Encounter: Payer: Self-pay | Admitting: Podiatry

## 2023-10-08 ENCOUNTER — Ambulatory Visit: Payer: Medicare Other | Admitting: Podiatry

## 2023-10-08 VITALS — Ht 70.0 in | Wt 213.0 lb

## 2023-10-08 DIAGNOSIS — B351 Tinea unguium: Secondary | ICD-10-CM

## 2023-10-08 DIAGNOSIS — M79675 Pain in left toe(s): Secondary | ICD-10-CM | POA: Diagnosis not present

## 2023-10-08 DIAGNOSIS — M79674 Pain in right toe(s): Secondary | ICD-10-CM | POA: Diagnosis not present

## 2023-10-08 NOTE — Progress Notes (Signed)
  Subjective:  Patient ID: [name], male    DOB: 09-09-1956,  MRN: [id number]  67 y.o. male presents at risk foot care with h/o clotting disorder and painful thick toenails that are difficult to trim. Pain interferes with ambulation. Aggravating factors include wearing enclosed shoe gear. Pain is relieved with periodic professional debridement.  He is a resident of [facility]. He is accompanied by staff member, [name], on today's visit. Chief Complaint  Patient presents with   Nail Problem    Pt is here for RFC,  not a diabetic PCP is Dr [name] and LOV was in September.   New problem(s): None   PCP is Jonathan Florida, [name].  Allergies  Allergen Reactions   Other Other (See Comments)    Ask   Propoxyphene Other (See Comments)    Unknown reaction    Review of Systems: Negative except as noted in the HPI.   Objective:  [name] is a pleasant 67 y.o. male in NAD. AAO x 3.  Vascular Examination: Vascular status intact b/l with palpable pedal pulses. CFT immediate b/l. Pedal hair present. No edema. No pain with calf compression b/l. Skin temperature gradient WNL b/l. No varicosities noted. No cyanosis or clubbing noted.  Neurological Examination: Sensation grossly intact b/l with 10 gram monofilament. Vibratory sensation intact b/l.  Dermatological Examination: Pedal skin with normal turgor, texture and tone b/l. No open wounds nor interdigital macerations noted. Toenails 1-5 b/l thick, discolored, elongated with subungual debris and pain on dorsal palpation. No hyperkeratotic lesions noted b/l.   Musculoskeletal Examination: Muscle strength 5/5 to b/l LE.  No pain, crepitus noted b/l. No gross pedal deformities. Patient ambulates independently without assistive aids.   Radiographs: None  Last A1c:       No data to display           Assessment:   1. Pain due to onychomycosis of toenails of both feet    Plan:  Patient was evaluated and  treated. All patient's and/or POA's questions/concerns addressed on today's visit. Toenails 1-5 debrided in length and girth without incident. Continue soft, supportive shoe gear daily. Report any pedal injuries to medical professional. Call office if there are any questions/concerns. -Facility to continue fall precautions and pressure precautions. -Patient/POA to call should there be question/concern in the interim.  Return in about 10 weeks (around 12/17/2023).  [name], DPM      [facility] LOCATION: [address] [place] [address]                   Office [phone]   [facility] LOCATION: [address] [place], [place] [address] Office [phone]

## 2024-01-07 ENCOUNTER — Ambulatory Visit: Payer: Medicare Other | Admitting: Podiatry

## 2024-01-07 ENCOUNTER — Encounter: Payer: Self-pay | Admitting: Podiatry

## 2024-01-07 VITALS — Ht 70.0 in | Wt 213.0 lb

## 2024-01-07 DIAGNOSIS — D689 Coagulation defect, unspecified: Secondary | ICD-10-CM | POA: Diagnosis not present

## 2024-01-07 DIAGNOSIS — M79674 Pain in right toe(s): Secondary | ICD-10-CM | POA: Diagnosis not present

## 2024-01-07 DIAGNOSIS — M79675 Pain in left toe(s): Secondary | ICD-10-CM | POA: Diagnosis not present

## 2024-01-07 DIAGNOSIS — B351 Tinea unguium: Secondary | ICD-10-CM

## 2024-01-07 NOTE — Progress Notes (Signed)
  Subjective:  Patient ID: [name], male    DOB: [birth year],  MRN: [id number]  [name] presents to clinic today for at risk foot care with h/o clotting disorder and painful, elongated thickened toenails x 10 which are symptomatic when wearing enclosed shoe gear. This interferes with his/her daily activities.  He is a resident of [address] and is accompanied by staff member, Irving Burton. Chief Complaint  Patient presents with   Nail Problem    Pt is here for [facility] PCP is Dr Earl Lites and unsure of LOV.   New problem(s): None.   PCP is [name], FNP.  Allergies  Allergen Reactions   Other Other (See Comments)    Ask   Propoxyphene Other (See Comments)    Unknown reaction    Review of Systems: Negative except as noted in the HPI.  Objective: No changes noted in today's physical examination. There were no vitals filed for this visit. [name] is a pleasant 68 y.o. male obese in NAD. AAO x 3.  Vascular Examination: Vascular status intact b/l with palpable pedal pulses. CFT immediate b/l. Pedal hair present. No edema. No pain with calf compression b/l. Skin temperature gradient WNL b/l. No varicosities noted. No cyanosis or clubbing noted.  Neurological Examination: Sensation grossly intact b/l with 10 gram monofilament. Vibratory sensation intact b/l.  Dermatological Examination: Pedal skin with normal turgor, texture and tone b/l. No open wounds nor interdigital macerations noted. Toenails 1-5 b/l thick, discolored, elongated with subungual debris and pain on dorsal palpation. No hyperkeratotic lesions noted b/l.   Musculoskeletal Examination: Muscle strength 5/5 to b/l LE.  No pain, crepitus noted b/l. No gross pedal deformities. Patient ambulates independently without assistive aids. Wearing diabetic shoes.  Radiographs: None  Assessment/Plan: 1. Pain due to onychomycosis of toenails of both feet   2. Clotting disorder [facility])    -Facility staff  present with patient.  All questions/concerns addressed on today's visit. -Consent given for treatment as described below: -Facility to continue fall precautions and pressure precautions. -Patient to continue soft, supportive shoe gear daily. -Mycotic toenails 1-5 bilaterally were debrided in length and girth with sterile nail nippers and dremel without incident. -Patient/POA to call should there be question/concern in the interim.   Return in about 3 months (around 04/08/2024).  Freddie Breech, DPM      [place] LOCATION: [address], [place] [address]                   Office [phone]   [facility] LOCATION: [address] [place], [place] [address] Office [phone]

## 2024-01-23 ENCOUNTER — Telehealth: Payer: Self-pay | Admitting: Internal Medicine

## 2024-01-23 NOTE — Telephone Encounter (Signed)
 Pts sister called and is concerned about a U/A that was done at his facility 01/01/24. She wanted to know if there was any concern that [name] have called the abnormal values and if Dr. Noelle Penner feels [name] need more labs. [facility] and requested results from 3/11 be faxed for review by Dr Rhea Belton.

## 2024-01-23 NOTE — Telephone Encounter (Signed)
 Requesting f/u cal to discuss patient plan of care and medications. Please advise.

## 2024-01-25 NOTE — Telephone Encounter (Signed)
 Did she say the fax is or is not working?

## 2024-01-25 NOTE — Telephone Encounter (Signed)
 Inbound call from patient calling to follow up on labs. Requesting a call to discuss. Please advise.

## 2024-01-25 NOTE — Telephone Encounter (Signed)
 Suzette Battiest with [facility] called to let us know that their fax is working.

## 2024-02-25 ENCOUNTER — Ambulatory Visit (INDEPENDENT_AMBULATORY_CARE_PROVIDER_SITE_OTHER): Payer: Medicare Other | Admitting: Neurology

## 2024-02-25 ENCOUNTER — Encounter: Payer: Self-pay | Admitting: Neurology

## 2024-02-25 VITALS — BP 107/51 | HR 69 | Ht 70.0 in | Wt 222.2 lb

## 2024-02-25 DIAGNOSIS — G40009 Localization-related (focal) (partial) idiopathic epilepsy and epileptic syndromes with seizures of localized onset, not intractable, without status epilepticus: Secondary | ICD-10-CM

## 2024-02-25 MED ORDER — LEVETIRACETAM 500 MG PO TABS: ORAL_TABLET | ORAL | 3 refills | Status: AC

## 2024-02-25 MED ORDER — PRIMIDONE 250 MG PO TABS: ORAL_TABLET | ORAL | 3 refills | Status: AC
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
## 2024-03-05 ENCOUNTER — Encounter: Payer: Self-pay | Admitting: Internal Medicine

## 2024-03-05 ENCOUNTER — Telehealth: Payer: Self-pay | Admitting: Internal Medicine

## 2024-03-05 NOTE — Telephone Encounter (Signed)
 The 2 medicines listed are ustekinumab  biosimilar's It is okay to substitute for either bio similar's ustekinumab  at the same dose and dosing interval He has done well with this medication to help control his Crohn's disease and it should continue Annual TB testing QuantiFERON gold remains recommended

## 2024-03-05 NOTE — Telephone Encounter (Signed)
 Inbound call from patient's sister, states she got a letter stating stelara  would not be in his formula after June, would like to discuss other options with a nurse.

## 2024-03-05 NOTE — Telephone Encounter (Signed)
 Mychart message regarding the same issue sent to Dr. Bridgett Camps to review.

## 2024-03-06 ENCOUNTER — Telehealth: Payer: Self-pay

## 2024-03-06 ENCOUNTER — Other Ambulatory Visit: Payer: Self-pay

## 2024-03-06 MED ORDER — YESINTEK 45 MG/0.5ML ~~LOC~~ SOSY: 45.0000 mg | PREFILLED_SYRINGE | SUBCUTANEOUS | 11 refills | Status: AC

## 2024-03-06 NOTE — Telephone Encounter (Signed)
 Sent in rx for yesintek for pt will need PA

## 2024-03-07 ENCOUNTER — Telehealth: Payer: Self-pay

## 2024-03-07 ENCOUNTER — Other Ambulatory Visit (HOSPITAL_COMMUNITY): Payer: Self-pay

## 2024-03-07 NOTE — Telephone Encounter (Signed)
 Pharmacy Patient Advocate Encounter   Received notification from Pt Calls Messages that prior authorization for Yesintek 45MG /0.5ML syringes is required/requested.   Insurance verification completed.   The patient is insured through [facility] Medicare Part D.   Per test claim: PA required; PA submitted to above mentioned insurance via CoverMyMeds Key/confirmation #/EOC [id number] Status is pending

## 2024-03-07 NOTE — Telephone Encounter (Signed)
 PA request has been Submitted. New Encounter has been or will be created for follow up. For additional info see Pharmacy Prior Auth telephone encounter from 03-07-2024.

## 2024-03-07 NOTE — Telephone Encounter (Signed)
 Patient is able to fill another Stelara  at the time being (a test claim gives a paid claim, so okay to do refill today if he wants to get that and hold onto it until he is due for next injection).  I will start a PA for new medication for refill after this since new formulary goes into effect 03-23-2024.  Additionally, if patient does fill again, I can always try for a PA for Stelara  after 03-23-2024 just to see if we can keep him on it as the letter he received states its possible to file for an exception if we have medical reason to keep him on it. Please let me know which the provider would like to do.

## 2024-03-07 NOTE — Telephone Encounter (Signed)
 Provider had told pts sister the biosimilar was fine.

## 2024-03-10 ENCOUNTER — Encounter: Payer: Self-pay | Admitting: Internal Medicine

## 2024-03-10 NOTE — Telephone Encounter (Signed)
 Pharmacy Patient Advocate Encounter  Additional information has been requested from the patient's insurance in order to proceed with the prior authorization request. Requested information has been sent, or form has been filled out and faxed back to [facility]

## 2024-03-10 NOTE — Telephone Encounter (Signed)
 I have already faxed over additional information and am awaiting a redetermination.

## 2024-03-10 NOTE — Telephone Encounter (Signed)
 Please see the note on this that the family states the  pA was denied.

## 2024-03-20 ENCOUNTER — Other Ambulatory Visit (HOSPITAL_COMMUNITY): Payer: Self-pay

## 2024-03-20 NOTE — Telephone Encounter (Signed)
 Pharmacy Patient Advocate Encounter  Authorization approved, no letter received for dates. 11 refills were approved, which allows for 1 year authorization.  Last filled 03-12-2024 and next available fill is 04-23-2024

## 2024-04-11 ENCOUNTER — Ambulatory Visit (INDEPENDENT_AMBULATORY_CARE_PROVIDER_SITE_OTHER): Admitting: Podiatry

## 2024-04-11 DIAGNOSIS — Z91198 Patient's noncompliance with other medical treatment and regimen for other reason: Secondary | ICD-10-CM

## 2024-04-13 NOTE — Progress Notes (Signed)
 1. Failure to attend appointment with reason given   Appointment rescheduled.

## 2024-04-14 ENCOUNTER — Ambulatory Visit: Admitting: Podiatry

## 2024-04-14 ENCOUNTER — Encounter: Payer: Self-pay | Admitting: Podiatry

## 2024-04-14 DIAGNOSIS — B351 Tinea unguium: Secondary | ICD-10-CM | POA: Diagnosis not present

## 2024-04-14 DIAGNOSIS — M79675 Pain in left toe(s): Secondary | ICD-10-CM | POA: Diagnosis not present

## 2024-04-14 DIAGNOSIS — D689 Coagulation defect, unspecified: Secondary | ICD-10-CM | POA: Diagnosis not present

## 2024-04-14 DIAGNOSIS — M79674 Pain in right toe(s): Secondary | ICD-10-CM | POA: Diagnosis not present

## 2024-04-14 NOTE — Progress Notes (Signed)
  Subjective:  Patient ID: [name], male    DOB: Mar 20, 1956,  MRN: [id number]  [name] presents to clinic today for at risk foot care with h/o clotting disorder and painful thick toenails that are difficult to trim. Pain interferes with ambulation. Aggravating factors include wearing enclosed shoe gear. Pain is relieved with periodic professional debridement.  He is accompanied by [name] staff member on today's visit.   Chief Complaint  Patient presents with   RFC    Rm RFC/ not diabetic   New problem(s): None.   PCP is Cordella Corning, FNP.  Allergies  Allergen Reactions   Other Other (See Comments)    Ask   Propoxyphene Other (See Comments)    Unknown reaction    Review of Systems: Negative except as noted in the HPI.  Objective: No changes noted in today's physical examination. There were no vitals filed for this visit. [name] is a pleasant 68 y.o. male obese in NAD. AAO x 3.  Vascular Examination: Capillary refill time immediate b/l. Palpable pedal pulses. Pedal hair present b/l. No pain with calf compression b/l. Skin temperature gradient WNL b/l. No cyanosis or clubbing b/l. No ischemia or gangrene noted b/l. No edema noted b/l LE.  Neurological Examination: Sensation grossly intact b/l with 10 gram monofilament. Vibratory sensation intact b/l.   Dermatological Examination: Pedal skin with normal turgor, texture and tone b/l.  No open wounds. No interdigital macerations.   Toenails 1-5 b/l thick, discolored, elongated with subungual debris and pain on dorsal palpation.   No corns, calluses nor porokeratotic lesions noted.  Musculoskeletal Examination: Muscle strength 5/5 to all lower extremity muscle groups bilaterally. No pain, crepitus or joint limitation noted with ROM b/l LE. No gross bony pedal deformities b/l. Patient ambulates independently without assistive aids.  Radiographs: None  Last A1c:       No data to display           Assessment/Plan: 1. Pain due to onychomycosis of toenails of both feet   2. Clotting disorder [facility])   -Patient was evaluated today. All questions/concerns addressed on today's visit. -Facility staff present with patient.  All questions/concerns addressed on today's visit. -Continue supportive shoe gear daily. -Toenails 1-5 b/l were debrided in length and girth with sterile nail nippers and dremel without iatrogenic bleeding.  -Patient/POA to call should there be question/concern in the interim.   Return in about 3 months (around 07/15/2024).  [name], DPM      [place] LOCATION: [address] [place] [address]                   Office [phone]   [facility] LOCATION: [address] [place], [place] [address] Office [phone]

## 2024-04-19 ENCOUNTER — Encounter: Payer: Self-pay | Admitting: Podiatry

## 2024-04-22 ENCOUNTER — Other Ambulatory Visit: Payer: Self-pay | Admitting: Nephrology

## 2024-04-22 DIAGNOSIS — I5030 Unspecified diastolic (congestive) heart failure: Secondary | ICD-10-CM

## 2024-04-22 DIAGNOSIS — N179 Acute kidney failure, unspecified: Secondary | ICD-10-CM

## 2024-05-05 ENCOUNTER — Ambulatory Visit
Admission: RE | Admit: 2024-05-05 | Discharge: 2024-05-05 | Disposition: A | Discharge status: Home / Self Care | Source: Ambulatory Visit | Attending: Nephrology | Admitting: Nephrology

## 2024-05-05 DIAGNOSIS — I5030 Unspecified diastolic (congestive) heart failure: Secondary | ICD-10-CM | POA: Insufficient documentation

## 2024-05-05 DIAGNOSIS — N179 Acute kidney failure, unspecified: Secondary | ICD-10-CM | POA: Diagnosis present

## 2024-05-22 ENCOUNTER — Telehealth: Payer: Self-pay

## 2024-05-22 NOTE — Telephone Encounter (Signed)
 Pharmacy Patient Advocate Encounter   Received notification from CoverMyMeds that prior authorization for Stelara  45MG /0.5ML solution is required/requested.   Insurance verification completed.   The patient is insured through [facility] Medicare Part D.   Per test claim: Therapy changed to Yesintek 

## 2024-06-07 ENCOUNTER — Other Ambulatory Visit: Payer: Self-pay

## 2024-06-07 ENCOUNTER — Emergency Department

## 2024-06-07 ENCOUNTER — Inpatient Hospital Stay
Admission: EM | Admit: 2024-06-07 | Discharge: 2024-06-11 | DRG: 312 | Disposition: A | Discharge status: Home Health | Source: Skilled Nursing Facility | Attending: Family Medicine | Admitting: Family Medicine

## 2024-06-07 DIAGNOSIS — Z1152 Encounter for screening for COVID-19: Secondary | ICD-10-CM

## 2024-06-07 DIAGNOSIS — S0181XA Laceration without foreign body of other part of head, initial encounter: Secondary | ICD-10-CM | POA: Diagnosis present

## 2024-06-07 DIAGNOSIS — R42 Dizziness and giddiness: Secondary | ICD-10-CM

## 2024-06-07 DIAGNOSIS — F79 Unspecified intellectual disabilities: Secondary | ICD-10-CM | POA: Diagnosis present

## 2024-06-07 DIAGNOSIS — I951 Orthostatic hypotension: Principal | ICD-10-CM | POA: Diagnosis present

## 2024-06-07 DIAGNOSIS — Y92098 Other place in other non-institutional residence as the place of occurrence of the external cause: Secondary | ICD-10-CM

## 2024-06-07 DIAGNOSIS — I5032 Chronic diastolic (congestive) heart failure: Secondary | ICD-10-CM | POA: Diagnosis present

## 2024-06-07 DIAGNOSIS — R509 Fever, unspecified: Secondary | ICD-10-CM | POA: Diagnosis present

## 2024-06-07 DIAGNOSIS — Z860102 Personal history of hyperplastic colon polyps: Secondary | ICD-10-CM

## 2024-06-07 DIAGNOSIS — R600 Localized edema: Secondary | ICD-10-CM | POA: Diagnosis present

## 2024-06-07 DIAGNOSIS — Y92129 Unspecified place in nursing home as the place of occurrence of the external cause: Secondary | ICD-10-CM

## 2024-06-07 DIAGNOSIS — Z860100 Personal history of colon polyps, unspecified: Secondary | ICD-10-CM

## 2024-06-07 DIAGNOSIS — I447 Left bundle-branch block, unspecified: Secondary | ICD-10-CM | POA: Diagnosis present

## 2024-06-07 DIAGNOSIS — Z23 Encounter for immunization: Secondary | ICD-10-CM

## 2024-06-07 DIAGNOSIS — W19XXXS Unspecified fall, sequela: Secondary | ICD-10-CM | POA: Diagnosis not present

## 2024-06-07 DIAGNOSIS — K529 Noninfective gastroenteritis and colitis, unspecified: Secondary | ICD-10-CM | POA: Diagnosis present

## 2024-06-07 DIAGNOSIS — Z8711 Personal history of peptic ulcer disease: Secondary | ICD-10-CM

## 2024-06-07 DIAGNOSIS — E66811 Obesity, class 1: Secondary | ICD-10-CM | POA: Diagnosis present

## 2024-06-07 DIAGNOSIS — Z808 Family history of malignant neoplasm of other organs or systems: Secondary | ICD-10-CM

## 2024-06-07 DIAGNOSIS — K5080 Crohn's disease of both small and large intestine without complications: Secondary | ICD-10-CM | POA: Diagnosis not present

## 2024-06-07 DIAGNOSIS — S61411A Laceration without foreign body of right hand, initial encounter: Secondary | ICD-10-CM | POA: Diagnosis present

## 2024-06-07 DIAGNOSIS — R531 Weakness: Secondary | ICD-10-CM

## 2024-06-07 DIAGNOSIS — E860 Dehydration: Secondary | ICD-10-CM | POA: Diagnosis not present

## 2024-06-07 DIAGNOSIS — Z6832 Body mass index (BMI) 32.0-32.9, adult: Secondary | ICD-10-CM

## 2024-06-07 DIAGNOSIS — Z800 Family history of malignant neoplasm of digestive organs: Secondary | ICD-10-CM

## 2024-06-07 DIAGNOSIS — W19XXXA Unspecified fall, initial encounter: Principal | ICD-10-CM | POA: Diagnosis present

## 2024-06-07 DIAGNOSIS — I110 Hypertensive heart disease with heart failure: Secondary | ICD-10-CM | POA: Diagnosis present

## 2024-06-07 DIAGNOSIS — E8721 Acute metabolic acidosis: Secondary | ICD-10-CM | POA: Diagnosis present

## 2024-06-07 DIAGNOSIS — Z79624 Long term (current) use of inhibitors of nucleotide synthesis: Secondary | ICD-10-CM

## 2024-06-07 DIAGNOSIS — R112 Nausea with vomiting, unspecified: Secondary | ICD-10-CM

## 2024-06-07 DIAGNOSIS — I493 Ventricular premature depolarization: Secondary | ICD-10-CM | POA: Diagnosis present

## 2024-06-07 DIAGNOSIS — I351 Nonrheumatic aortic (valve) insufficiency: Secondary | ICD-10-CM | POA: Diagnosis present

## 2024-06-07 DIAGNOSIS — N400 Enlarged prostate without lower urinary tract symptoms: Secondary | ICD-10-CM | POA: Diagnosis present

## 2024-06-07 DIAGNOSIS — G40A09 Absence epileptic syndrome, not intractable, without status epilepticus: Secondary | ICD-10-CM | POA: Diagnosis present

## 2024-06-07 DIAGNOSIS — K5090 Crohn's disease, unspecified, without complications: Secondary | ICD-10-CM | POA: Diagnosis present

## 2024-06-07 DIAGNOSIS — Z9049 Acquired absence of other specified parts of digestive tract: Secondary | ICD-10-CM

## 2024-06-07 LAB — CBC WITH DIFFERENTIAL/PLATELET
Abs Immature Granulocytes: 0.03 K/uL (ref 0.00–0.07)
Basophils Absolute: 0 K/uL (ref 0.0–0.1)
Basophils Relative: 0 %
Eosinophils Absolute: 0 K/uL (ref 0.0–0.5)
Eosinophils Relative: 0 %
HCT: 43.2 % (ref 39.0–52.0)
Hemoglobin: 14.2 g/dL (ref 13.0–17.0)
Immature Granulocytes: 0 %
Lymphocytes Relative: 1 %
Lymphs Abs: 0.1 K/uL — ABNORMAL LOW (ref 0.7–4.0)
MCH: 32.8 pg (ref 26.0–34.0)
MCHC: 32.9 g/dL (ref 30.0–36.0)
MCV: 99.8 fL (ref 80.0–100.0)
Monocytes Absolute: 0.5 K/uL (ref 0.1–1.0)
Monocytes Relative: 5 %
Neutro Abs: 10.2 K/uL — ABNORMAL HIGH (ref 1.7–7.7)
Neutrophils Relative %: 94 %
Platelets: 227 K/uL (ref 150–400)
RBC: 4.33 MIL/uL (ref 4.22–5.81)
RDW: 13.7 % (ref 11.5–15.5)
WBC: 10.9 K/uL — ABNORMAL HIGH (ref 4.0–10.5)
nRBC: 0 % (ref 0.0–0.2)

## 2024-06-07 LAB — URINALYSIS, W/ REFLEX TO CULTURE (INFECTION SUSPECTED)
Bacteria, UA: NONE SEEN
Bilirubin Urine: NEGATIVE
Glucose, UA: NEGATIVE mg/dL
Hgb urine dipstick: NEGATIVE
Ketones, ur: NEGATIVE mg/dL
Leukocytes,Ua: NEGATIVE
Nitrite: NEGATIVE
Protein, ur: 30 mg/dL — AB
Specific Gravity, Urine: 1.017 (ref 1.005–1.030)
Squamous Epithelial / HPF: 0 /HPF (ref 0–5)
pH: 5 (ref 5.0–8.0)

## 2024-06-07 LAB — BASIC METABOLIC PANEL WITH GFR
Anion gap: 6 (ref 5–15)
BUN: 17 mg/dL (ref 8–23)
CO2: 18 mmol/L — ABNORMAL LOW (ref 22–32)
Calcium: 6.5 mg/dL — ABNORMAL LOW (ref 8.9–10.3)
Chloride: 115 mmol/L — ABNORMAL HIGH (ref 98–111)
Creatinine, Ser: 0.68 mg/dL (ref 0.61–1.24)
GFR, Estimated: >60 mL/min (ref >60)
Glucose, Bld: 103 mg/dL — ABNORMAL HIGH (ref 70–99)
Potassium: 3.5 mmol/L (ref 3.5–5.1)
Sodium: 139 mmol/L (ref 135–145)

## 2024-06-07 LAB — RESP PANEL BY RT-PCR (RSV, FLU A&B, COVID)  RVPGX2
Influenza A by PCR: NEGATIVE
Influenza B by PCR: NEGATIVE
Resp Syncytial Virus by PCR: NEGATIVE
SARS Coronavirus 2 by RT PCR: NEGATIVE

## 2024-06-07 LAB — COMPREHENSIVE METABOLIC PANEL WITH GFR
ALT: 9 U/L (ref 0–44)
AST: 40 U/L (ref 15–41)
Albumin: 3.9 g/dL (ref 3.5–5.0)
Alkaline Phosphatase: 123 U/L (ref 38–126)
Anion gap: 10 (ref 5–15)
BUN: 20 mg/dL (ref 8–23)
CO2: 21 mmol/L — ABNORMAL LOW (ref 22–32)
Calcium: 8.7 mg/dL — ABNORMAL LOW (ref 8.9–10.3)
Chloride: 105 mmol/L (ref 98–111)
Creatinine, Ser: 0.98 mg/dL (ref 0.61–1.24)
GFR, Estimated: >60 mL/min (ref >60)
Glucose, Bld: 146 mg/dL — ABNORMAL HIGH (ref 70–99)
Potassium: 6.1 mmol/L — ABNORMAL HIGH (ref 3.5–5.1)
Sodium: 136 mmol/L (ref 135–145)
Total Bilirubin: 1.3 mg/dL — ABNORMAL HIGH (ref 0.0–1.2)
Total Protein: 7.3 g/dL (ref 6.5–8.1)

## 2024-06-07 LAB — LACTIC ACID, PLASMA: Lactic Acid, Venous: 1.7 mmol/L (ref 0.5–1.9)

## 2024-06-07 LAB — TROPONIN I (HIGH SENSITIVITY): Troponin I (High Sensitivity): 21 ng/L — ABNORMAL HIGH (ref <18)

## 2024-06-07 MED ORDER — LEVETIRACETAM 500 MG PO TABS
500.0000 mg | ORAL_TABLET | Freq: Every day | ORAL | Status: DC
  Administered 2024-06-07 – 2024-06-11 (×5): 500 mg via ORAL
  Filled 2024-06-07 (×5): qty 1

## 2024-06-07 MED ORDER — ONDANSETRON HCL 4 MG/2ML IJ SOLN
4.0000 mg | Freq: Once | INTRAMUSCULAR | Status: AC
  Administered 2024-06-07: 4 mg via INTRAVENOUS
  Filled 2024-06-07: qty 2

## 2024-06-07 MED ORDER — FOLIC ACID 1 MG PO TABS
1.0000 mg | ORAL_TABLET | Freq: Every day | ORAL | Status: DC
  Administered 2024-06-08 – 2024-06-11 (×4): 1 mg via ORAL
  Filled 2024-06-07 (×4): qty 1

## 2024-06-07 MED ORDER — SODIUM CHLORIDE 0.9 % IV BOLUS
500.0000 mL | Freq: Once | INTRAVENOUS | Status: AC
  Administered 2024-06-07: 500 mL via INTRAVENOUS

## 2024-06-07 MED ORDER — ENOXAPARIN SODIUM 40 MG/0.4ML IJ SOSY: 40.0000 mg | PREFILLED_SYRINGE | INTRAMUSCULAR | Status: DC

## 2024-06-07 MED ORDER — ACETAMINOPHEN 500 MG PO TABS
1000.0000 mg | ORAL_TABLET | Freq: Once | ORAL | Status: AC
  Administered 2024-06-07: 1000 mg via ORAL
  Filled 2024-06-07: qty 2

## 2024-06-07 MED ORDER — FAMOTIDINE 20 MG PO TABS
40.0000 mg | ORAL_TABLET | Freq: Every day | ORAL | Status: DC
  Administered 2024-06-07 – 2024-06-11 (×5): 40 mg via ORAL
  Filled 2024-06-07 (×5): qty 2

## 2024-06-07 MED ORDER — ONDANSETRON HCL 4 MG PO TABS: 4.0000 mg | ORAL_TABLET | Freq: Four times a day (QID) | ORAL | Status: DC | PRN

## 2024-06-07 MED ORDER — MIDODRINE HCL 5 MG PO TABS
10.0000 mg | ORAL_TABLET | Freq: Two times a day (BID) | ORAL | Status: DC
  Administered 2024-06-07 – 2024-06-09 (×4): 10 mg via ORAL
  Filled 2024-06-07 (×4): qty 2

## 2024-06-07 MED ORDER — TAMSULOSIN HCL 0.4 MG PO CAPS
0.4000 mg | ORAL_CAPSULE | Freq: Every day | ORAL | Status: DC
  Administered 2024-06-07 – 2024-06-11 (×5): 0.4 mg via ORAL
  Filled 2024-06-07 (×5): qty 1

## 2024-06-07 MED ORDER — MIDODRINE HCL 5 MG PO TABS
10.0000 mg | ORAL_TABLET | Freq: Once | ORAL | Status: AC
  Administered 2024-06-07: 10 mg via ORAL
  Filled 2024-06-07: qty 2

## 2024-06-07 MED ORDER — POTASSIUM CHLORIDE IN NACL 20-0.9 MEQ/L-% IV SOLN
INTRAVENOUS | Status: AC
  Filled 2024-06-07 (×3): qty 1000

## 2024-06-07 MED ORDER — ACETAMINOPHEN 325 MG PO TABS: 650.0000 mg | ORAL_TABLET | Freq: Four times a day (QID) | ORAL | Status: DC | PRN

## 2024-06-07 MED ORDER — CYANOCOBALAMIN 1000 MCG/ML IJ SOLN: 1000.0000 ug | INTRAMUSCULAR | Status: DC

## 2024-06-07 MED ORDER — FERROUS SULFATE 325 (65 FE) MG PO TABS
325.0000 mg | ORAL_TABLET | Freq: Every day | ORAL | Status: DC
  Administered 2024-06-08 – 2024-06-10 (×3): 325 mg via ORAL
  Filled 2024-06-07 (×3): qty 1

## 2024-06-07 MED ORDER — SODIUM CHLORIDE 0.9 % IV BOLUS
1000.0000 mL | Freq: Once | INTRAVENOUS | Status: AC
  Administered 2024-06-07: 1000 mL via INTRAVENOUS

## 2024-06-07 MED ORDER — TETANUS-DIPHTH-ACELL PERTUSSIS 5-2.5-18.5 LF-MCG/0.5 IM SUSY
0.5000 mL | PREFILLED_SYRINGE | Freq: Once | INTRAMUSCULAR | Status: AC
  Administered 2024-06-07: 0.5 mL via INTRAMUSCULAR
  Filled 2024-06-07: qty 0.5

## 2024-06-07 MED ORDER — ENOXAPARIN SODIUM 60 MG/0.6ML IJ SOSY
0.5000 mg/kg | PREFILLED_SYRINGE | INTRAMUSCULAR | Status: DC
  Administered 2024-06-07 – 2024-06-10 (×4): 52.5 mg via SUBCUTANEOUS
  Filled 2024-06-07 (×4): qty 0.6

## 2024-06-07 MED ORDER — LOPERAMIDE HCL 2 MG PO CAPS
2.0000 mg | ORAL_CAPSULE | ORAL | Status: DC | PRN
  Administered 2024-06-07 – 2024-06-08 (×2): 2 mg via ORAL
  Filled 2024-06-07 (×2): qty 1

## 2024-06-07 MED ORDER — VITAMIN D 25 MCG (1000 UNIT) PO TABS
1000.0000 [IU] | ORAL_TABLET | Freq: Every day | ORAL | Status: DC
  Administered 2024-06-08 – 2024-06-11 (×4): 1000 [IU] via ORAL
  Filled 2024-06-07 (×4): qty 1

## 2024-06-07 MED ORDER — ONDANSETRON HCL 4 MG/2ML IJ SOLN
4.0000 mg | Freq: Four times a day (QID) | INTRAMUSCULAR | Status: DC | PRN
Start: 2024-06-07 — End: 2024-06-11

## 2024-06-07 MED ORDER — ACETAMINOPHEN 650 MG RE SUPP: 650.0000 mg | Freq: Four times a day (QID) | RECTAL | Status: DC | PRN

## 2024-06-07 MED ORDER — LIDOCAINE HCL (PF) 1 % IJ SOLN
5.0000 mL | Freq: Once | INTRAMUSCULAR | Status: AC
  Administered 2024-06-07: 5 mL
  Filled 2024-06-07: qty 5

## 2024-06-07 MED ORDER — FLUTICASONE PROPIONATE 50 MCG/ACT NA SUSP
1.0000 | Freq: Every day | NASAL | Status: DC
  Administered 2024-06-08 – 2024-06-11 (×4): 1 via NASAL
  Filled 2024-06-07: qty 16

## 2024-06-07 MED ORDER — MESALAMINE ER 250 MG PO CPCR
1000.0000 mg | ORAL_CAPSULE | Freq: Four times a day (QID) | ORAL | Status: DC
  Administered 2024-06-07 – 2024-06-11 (×15): 1000 mg via ORAL
  Filled 2024-06-07 (×17): qty 4

## 2024-06-07 MED ORDER — AZATHIOPRINE 50 MG PO TABS
50.0000 mg | ORAL_TABLET | Freq: Every day | ORAL | Status: DC
  Administered 2024-06-07 – 2024-06-11 (×5): 50 mg via ORAL
  Filled 2024-06-07 (×5): qty 1

## 2024-06-07 NOTE — ED Provider Notes (Signed)
 [facility] Provider Note    Event Date/Time   First MD Initiated Contact with Patient 06/07/24 [phone]     (approximate)   History   Fall   HPI  [name] is a 68 y.o. male past medical history significant for hypertension, BPH, Crohn's, seizure disorder, peptic ulcer disease, who presents to the emergency department following a fall with generalized weakness.  Patient comes from [facility].  States that he got out of the bathroom today and had generalized weakness and felt like that caused him to fall and his legs gave out from underneath him.  Hit his head.  Does not believe he is on any blood thinners.  Complaining of some mild pain to his right hand.  He is uncertain of his last tetanus shot.  States that he ate something yesterday afternoon and felt like it caused him to have an upset stomach.  Denies fever, chills.  Does endorse some nausea vomiting and diarrhea last night after a snack.  Denies any nausea vomiting or diarrhea at this time.  Denies any chest pain or shortness of breath.  Denies dysuria, urinary urgency or frequency.  Does not believe that he has had any changes to his home medications.  Endorses normal p.o. intake and normal urine output.  Denies any blood in his stool or melena.  States that he takes seizure medications and believes that he is on Keppra  and another medication.  Denies any dizziness, numbness or tingling.  After getting more information from family members at bedside sounds like he has had multiple episodes of diarrhea.  Nausea and vomiting his home medications last night and possibly today.  Patient does take midodrine .  On chart review from note from nephrologist patient also takes midodrine .  Patient did not arrive with a med rec of home medications.  Will call [facility] to see if we can get an updated MAR.     Physical Exam   Triage Vital Signs: ED Triage Vitals  Encounter Vitals Group     BP      Girls  Systolic BP Percentile      Girls Diastolic BP Percentile      Boys Systolic BP Percentile      Boys Diastolic BP Percentile      Pulse      Resp      Temp      Temp src      SpO2      Weight      Height      Head Circumference      Peak Flow      Pain Score      Pain Loc      Pain Education      Exclude from Growth Chart     Most recent vital signs: Vitals:   06/07/24 1430 06/07/24 1450  BP: (!) 138/57 (!) 112/51  Pulse: 78 76  Resp:    Temp:    SpO2: 97% 95%    Physical Exam Constitutional:      Appearance: He is well-developed.  HENT:     Head:     Comments: Right forehead laceration, no active bleeding    Mouth/Throat:     Mouth: Mucous membranes are moist.  Eyes:     Extraocular Movements: Extraocular movements intact.     Conjunctiva/sclera: Conjunctivae normal.     Pupils: Pupils are equal, round, and reactive to light.  Neck:     Comments: Mild  paraspinal muscle tenderness to palpation Cardiovascular:     Rate and Rhythm: Regular rhythm.  Pulmonary:     Effort: No respiratory distress.  Abdominal:     Tenderness: There is no abdominal tenderness.  Musculoskeletal:     Cervical back: Normal range of motion.     Right lower leg: No edema.     Left lower leg: No edema.     Comments: No midline lumbar or thoracic tenderness to palpation.  Mild tenderness to palpation to the right hand.  Mild tenderness to the right hip and right knee.  Ecchymosis to the right knee.  Full range of motion to the shoulder, elbow and wrist.  No tenderness to palpation to the left upper or lower extremity.  Full range of motion.  Good capillary refill.  Skin:    General: Skin is warm.     Capillary Refill: Capillary refill takes less than 2 seconds.     Comments: Laceration over the palmar surface of the right hand at the third finger -approximately 2 cm, no active bleeding.  Neurological:     General: No focal deficit present.     Mental Status: He is alert. Mental status  is at baseline.  Psychiatric:        Mood and Affect: Mood normal.     IMPRESSION / MDM / ASSESSMENT AND PLAN / ED COURSE  I reviewed the triage vital signs and the nursing notes.  Differential diagnosis including electrolyte abnormality, dehydration, infectious process including urinary tract infection or pneumonia, intracranial hemorrhage, fracture, ACS, anemia, medication side effect  On chart review patient is supposed to be taking midodrine  with nephrology, do not have an updated MAR, calling [facility] to try to get an updated MAR  Tetanus updated  EKG  I, Clotilda Punter, the attending physician, personally viewed and interpreted this ECG.  EKG showed normal sinus rhythm.  Multiple PVCs noted.  Reading is short PR however I believe they are counting artifact for the PR interval and appears to be normal PR interval on my evaluation.  Narrow complex.  QTc 458.  Nonspecific ST changes.  Increased rate when compared to prior EKG.  No tachycardic or bradycardic dysrhythmias while on cardiac telemetry.  RADIOLOGY I independently reviewed imaging, my interpretation of imaging: CT scan of the head -no signs of bleeding  CT cervical spine -no acute fracture  X-ray right hand, chest x-ray, right hip x-ray and right knee x-ray-no acute fracture  LABS (all labs ordered are listed, but only abnormal results are displayed) Labs interpreted as -    Labs Reviewed  CBC WITH DIFFERENTIAL/PLATELET - Abnormal; Notable for the following components:      Result Value   WBC 10.9 (*)    Neutro Abs 10.2 (*)    Lymphs Abs 0.1 (*)    All other components within normal limits  COMPREHENSIVE METABOLIC PANEL WITH GFR - Abnormal; Notable for the following components:   Potassium 6.1 (*)    CO2 21 (*)    Glucose, Bld 146 (*)    Calcium 8.7 (*)    Total Bilirubin 1.3 (*)    All other components within normal limits  URINALYSIS, W/ REFLEX TO CULTURE (INFECTION SUSPECTED) - Abnormal; Notable  for the following components:   Color, Urine YELLOW (*)    APPearance CLEAR (*)    Protein, ur 30 (*)    All other components within normal limits  BASIC METABOLIC PANEL WITH GFR - Abnormal; Notable for the following  components:   Chloride 115 (*)    CO2 18 (*)    Glucose, Bld 103 (*)    Calcium 6.5 (*)    All other components within normal limits  TROPONIN I (HIGH SENSITIVITY) - Abnormal; Notable for the following components:   Troponin I (High Sensitivity) 21 (*)    All other components within normal limits  RESP PANEL BY RT-PCR (RSV, FLU A&B, COVID)  RVPGX2  LACTIC ACID, PLASMA  HIV ANTIBODY (ROUTINE TESTING W REFLEX)  CBC  CREATININE, SERUM     MDM  Given Tylenol  for pain control.  Will give 1 L of IV fluids.  On reevaluation continues to have soft blood pressures, given another dose of 500 bolus of IV fluids ordered his home midodrine  after IV antiemetics.  Abdomen nontender to palpation, have a low suspicion for intra-abdominal pathology or Crohn's flare.  COVID and influenza testing are negative.  Leukocytosis of 10.9.  Initial lab work with significant hemolysis a repeat lab work obtained.  Low CO2 so added on a lactic acid.  Troponin mildly elevated at 21.  X-ray of the right hand with no acute fracture, right hip with no acute fracture and chest x-ray with no signs of pneumonia or fracture.  X-ray of the right knee with no acute fracture.  CT scan of the head with no acute fracture or intracranial hemorrhage.  On reevaluation unable to stand secondary to generalized weakness.  Normally ambulates with a walker.  Unable to get orthostatic blood pressures.  In-N-Out catheter for urine.  Give another 500 bolus given ongoing signs of dehydration.  No signs of urinary tract infection.  On reevaluation patient continued to have orthostatic hypotension and was unable to stand secondary to generalized weakness and dizziness.  Likely in the setting of dehydration.  Consulted  hospitalist for admission for dehydration and hypotension.     PROCEDURES:  Critical Care performed: yes  .Critical Care  Performed by: Suzanne Kirsch, MD Authorized by: Suzanne Kirsch, MD   Critical care provider statement:    Critical care time (minutes):  30   Critical care time was exclusive of:  Separately billable procedures and treating other patients   Critical care was necessary to treat or prevent imminent or life-threatening deterioration of the following conditions: dehydration, hypotension.   Critical care was time spent personally by me on the following activities:  Development of treatment plan with patient or surrogate, discussions with consultants, evaluation of patient's response to treatment, examination of patient, ordering and review of laboratory studies, ordering and review of radiographic studies, ordering and performing treatments and interventions, pulse oximetry, re-evaluation of patient's condition and review of old charts   Care discussed with: admitting provider     Patient's presentation is most consistent with acute presentation with potential threat to life or bodily function.   MEDICATIONS ORDERED IN ED: Medications  lidocaine  (PF) (XYLOCAINE ) 1 % injection 5 mL (has no administration in time range)  0.9 % NaCl with KCl 20 mEq/ L  infusion (has no administration in time range)  acetaminophen  (TYLENOL ) tablet 650 mg (has no administration in time range)    Or  acetaminophen  (TYLENOL ) suppository 650 mg (has no administration in time range)  ondansetron  (ZOFRAN ) tablet 4 mg (has no administration in time range)    Or  ondansetron  (ZOFRAN ) injection 4 mg (has no administration in time range)  sodium chloride  0.9 % bolus 500 mL (has no administration in time range)  enoxaparin  (LOVENOX ) injection 52.5 mg (has  no administration in time range)  Tdap (BOOSTRIX) injection 0.5 mL (0.5 mLs Intramuscular Given 06/07/24 0838)  acetaminophen  (TYLENOL ) tablet  1,000 mg (1,000 mg Oral Given 06/07/24 0902)  sodium chloride  0.9 % bolus 1,000 mL (0 mLs Intravenous Stopped 06/07/24 1028)  ondansetron  (ZOFRAN ) injection 4 mg (4 mg Intravenous Given 06/07/24 0902)  sodium chloride  0.9 % bolus 500 mL (0 mLs Intravenous Stopped 06/07/24 1430)  midodrine  (PROAMATINE ) tablet 10 mg (10 mg Oral Given 06/07/24 1246)    FINAL CLINICAL IMPRESSION(S) / ED DIAGNOSES   Final diagnoses:  Fall, initial encounter  Weakness  Laceration of forehead, initial encounter  Laceration of right hand without foreign body, initial encounter  Dehydration  Nausea vomiting and diarrhea     Rx / DC Orders   ED Discharge Orders     None        Note:  This document was prepared using Dragon voice recognition software and may include unintentional dictation errors.   Suzanne Kirsch, MD 06/07/24 1513

## 2024-06-07 NOTE — ED Notes (Signed)
 Pt tolerating lunch tray at this time

## 2024-06-07 NOTE — ED Triage Notes (Addendum)
 Pt from [facility] via ems for fall this am- got weak coming out of bathroom. 1  lac to forehead and skin tear on right 3rd finger noted. Pt AOX4, reports N/V/D last night after a snack. Pt c/o of pain at head wound only. Pt on thinners per [facility] staff but none listed in med rec form per EMS.  105 98% 130/62 [id number] Pt reports generalized weakness, no dizziness or numbness/tingling.

## 2024-06-07 NOTE — Progress Notes (Signed)
 PHARMACIST - PHYSICIAN COMMUNICATION  CONCERNING:  Enoxaparin  (Lovenox ) for DVT Prophylaxis    RECOMMENDATION: Patient was prescribed enoxaprin 40mg  q24 hours for VTE prophylaxis.   Filed Weights   06/07/24 0749  Weight: 103.4 kg (228 lb)    Body mass index is 32.71 kg/m.  Estimated Creatinine Clearance: 103.5 mL/min (by C-G formula based on SCr of 0.68 mg/dL).   Based on [facility] policy patient is candidate for enoxaparin  0.5mg /kg TBW SQ every 24 hours based on BMI being >30.  DESCRIPTION: Pharmacy has adjusted enoxaparin  dose per [facility] policy.  Patient is now receiving enoxaparin  52.5 mg every 24 hours    Olam KANDICE Fritter, PharmD Clinical Pharmacist  06/07/2024 3:13 PM

## 2024-06-07 NOTE — ED Notes (Signed)
 Laceration cart placed by room for EDP use

## 2024-06-07 NOTE — ED Notes (Signed)
 Laceration on forehead irrigated and laceration on right hand soaked and irrigated with sterile solution.
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
## 2024-06-07 NOTE — Plan of Care (Signed)

## 2024-06-07 NOTE — ED Notes (Signed)
 Pt visit reports pt threw up morning meds which included meds for epilepsy, and states pt ate snacks yesterday that didn't agree w/ his stomach due to Chron's disease.

## 2024-06-07 NOTE — ED Notes (Signed)
 Patient transported to CT

## 2024-06-07 NOTE — ED Notes (Signed)
 Lidocaine  given to EDP ?

## 2024-06-07 NOTE — ED Notes (Signed)
 [facility] and requested med list to be faxed

## 2024-06-07 NOTE — ED Notes (Signed)
 IV team bedside.

## 2024-06-07 NOTE — ED Notes (Signed)
 Pt states he doesn't feel strong enough to sit or stand up.

## 2024-06-07 NOTE — ED Notes (Signed)
 Attempted IV without success, pt hard stick

## 2024-06-08 ENCOUNTER — Telehealth: Payer: Self-pay

## 2024-06-08 DIAGNOSIS — Z860100 Personal history of colon polyps, unspecified: Secondary | ICD-10-CM | POA: Diagnosis not present

## 2024-06-08 DIAGNOSIS — G40A09 Absence epileptic syndrome, not intractable, without status epilepticus: Secondary | ICD-10-CM | POA: Diagnosis present

## 2024-06-08 DIAGNOSIS — Z808 Family history of malignant neoplasm of other organs or systems: Secondary | ICD-10-CM | POA: Diagnosis not present

## 2024-06-08 DIAGNOSIS — K529 Noninfective gastroenteritis and colitis, unspecified: Secondary | ICD-10-CM | POA: Diagnosis present

## 2024-06-08 DIAGNOSIS — I493 Ventricular premature depolarization: Secondary | ICD-10-CM | POA: Diagnosis present

## 2024-06-08 DIAGNOSIS — Y92129 Unspecified place in nursing home as the place of occurrence of the external cause: Secondary | ICD-10-CM | POA: Diagnosis not present

## 2024-06-08 DIAGNOSIS — Z8711 Personal history of peptic ulcer disease: Secondary | ICD-10-CM | POA: Diagnosis not present

## 2024-06-08 DIAGNOSIS — Y92098 Other place in other non-institutional residence as the place of occurrence of the external cause: Secondary | ICD-10-CM | POA: Diagnosis not present

## 2024-06-08 DIAGNOSIS — R42 Dizziness and giddiness: Secondary | ICD-10-CM | POA: Diagnosis not present

## 2024-06-08 DIAGNOSIS — Z79624 Long term (current) use of inhibitors of nucleotide synthesis: Secondary | ICD-10-CM | POA: Diagnosis not present

## 2024-06-08 DIAGNOSIS — E8721 Acute metabolic acidosis: Secondary | ICD-10-CM | POA: Diagnosis present

## 2024-06-08 DIAGNOSIS — F79 Unspecified intellectual disabilities: Secondary | ICD-10-CM | POA: Diagnosis not present

## 2024-06-08 DIAGNOSIS — K5080 Crohn's disease of both small and large intestine without complications: Secondary | ICD-10-CM

## 2024-06-08 DIAGNOSIS — N400 Enlarged prostate without lower urinary tract symptoms: Secondary | ICD-10-CM | POA: Diagnosis present

## 2024-06-08 DIAGNOSIS — W19XXXA Unspecified fall, initial encounter: Secondary | ICD-10-CM | POA: Diagnosis present

## 2024-06-08 DIAGNOSIS — Z800 Family history of malignant neoplasm of digestive organs: Secondary | ICD-10-CM | POA: Diagnosis not present

## 2024-06-08 DIAGNOSIS — Z1152 Encounter for screening for COVID-19: Secondary | ICD-10-CM | POA: Diagnosis not present

## 2024-06-08 DIAGNOSIS — Z860102 Personal history of hyperplastic colon polyps: Secondary | ICD-10-CM | POA: Diagnosis not present

## 2024-06-08 DIAGNOSIS — R600 Localized edema: Secondary | ICD-10-CM | POA: Diagnosis not present

## 2024-06-08 DIAGNOSIS — Z6832 Body mass index (BMI) 32.0-32.9, adult: Secondary | ICD-10-CM | POA: Diagnosis not present

## 2024-06-08 DIAGNOSIS — Z23 Encounter for immunization: Secondary | ICD-10-CM | POA: Diagnosis present

## 2024-06-08 DIAGNOSIS — S0181XA Laceration without foreign body of other part of head, initial encounter: Secondary | ICD-10-CM | POA: Diagnosis present

## 2024-06-08 DIAGNOSIS — R531 Weakness: Secondary | ICD-10-CM

## 2024-06-08 DIAGNOSIS — I447 Left bundle-branch block, unspecified: Secondary | ICD-10-CM | POA: Diagnosis present

## 2024-06-08 DIAGNOSIS — E860 Dehydration: Secondary | ICD-10-CM | POA: Diagnosis present

## 2024-06-08 DIAGNOSIS — I951 Orthostatic hypotension: Secondary | ICD-10-CM | POA: Diagnosis present

## 2024-06-08 DIAGNOSIS — S61411A Laceration without foreign body of right hand, initial encounter: Secondary | ICD-10-CM | POA: Diagnosis present

## 2024-06-08 DIAGNOSIS — I110 Hypertensive heart disease with heart failure: Secondary | ICD-10-CM | POA: Diagnosis present

## 2024-06-08 DIAGNOSIS — Z9049 Acquired absence of other specified parts of digestive tract: Secondary | ICD-10-CM | POA: Diagnosis not present

## 2024-06-08 DIAGNOSIS — K5090 Crohn's disease, unspecified, without complications: Secondary | ICD-10-CM | POA: Diagnosis present

## 2024-06-08 DIAGNOSIS — I5032 Chronic diastolic (congestive) heart failure: Secondary | ICD-10-CM | POA: Diagnosis present

## 2024-06-08 DIAGNOSIS — I351 Nonrheumatic aortic (valve) insufficiency: Secondary | ICD-10-CM | POA: Diagnosis present

## 2024-06-08 DIAGNOSIS — E66811 Obesity, class 1: Secondary | ICD-10-CM | POA: Diagnosis present

## 2024-06-08 LAB — COMPREHENSIVE METABOLIC PANEL WITH GFR
ALT: 11 U/L (ref 0–44)
AST: 17 U/L (ref 15–41)
Albumin: 2.9 g/dL — ABNORMAL LOW (ref 3.5–5.0)
Alkaline Phosphatase: 86 U/L (ref 38–126)
Anion gap: 3 — ABNORMAL LOW (ref 5–15)
BUN: 16 mg/dL (ref 8–23)
CO2: 23 mmol/L (ref 22–32)
Calcium: 8 mg/dL — ABNORMAL LOW (ref 8.9–10.3)
Chloride: 114 mmol/L — ABNORMAL HIGH (ref 98–111)
Creatinine, Ser: 0.99 mg/dL (ref 0.61–1.24)
GFR, Estimated: >60 mL/min (ref >60)
Glucose, Bld: 96 mg/dL (ref 70–99)
Potassium: 4.6 mmol/L (ref 3.5–5.1)
Sodium: 140 mmol/L (ref 135–145)
Total Bilirubin: 0.5 mg/dL (ref 0.0–1.2)
Total Protein: 5.5 g/dL — ABNORMAL LOW (ref 6.5–8.1)

## 2024-06-08 LAB — CBC
HCT: 35 % — ABNORMAL LOW (ref 39.0–52.0)
Hemoglobin: 11.4 g/dL — ABNORMAL LOW (ref 13.0–17.0)
MCH: 32.9 pg (ref 26.0–34.0)
MCHC: 32.6 g/dL (ref 30.0–36.0)
MCV: 101.2 fL — ABNORMAL HIGH (ref 80.0–100.0)
Platelets: 173 K/uL (ref 150–400)
RBC: 3.46 MIL/uL — ABNORMAL LOW (ref 4.22–5.81)
RDW: 14 % (ref 11.5–15.5)
WBC: 5 K/uL (ref 4.0–10.5)
nRBC: 0 % (ref 0.0–0.2)

## 2024-06-08 LAB — PROTIME-INR
INR: 1.1 (ref 0.8–1.2)
Prothrombin Time: 14.3 s (ref 11.4–15.2)

## 2024-06-08 LAB — HIV ANTIBODY (ROUTINE TESTING W REFLEX): HIV Screen 4th Generation wRfx: NONREACTIVE

## 2024-06-08 MED ORDER — SODIUM CHLORIDE 0.9 % IV SOLN: INTRAVENOUS | Status: AC

## 2024-06-08 NOTE — Progress Notes (Signed)
  PROGRESS NOTE    [name]  FMW:[id number] DOB: June 27, 1956 DOA: 06/07/2024 PCP: Cordella Corning, FNP  [id number]  LOS: 0 days   Brief hospital course:   Assessment & Plan: [name] is a pleasant 68 y.o. male with medical history significant for HTN, BPH, chron's disease, seizure disorder, peptic ulcer disease, developmental delay who is a resident of [facility], brought in to emergency room after a fall and generalized weakness along with multiple episodes of diarrhea and vomiting.  Patient stated that he ate something yesterday and had nausea vomiting and diarrhea in the night.  He was coming out of his bathroom in his room and he fell as he did not have walker.    1.  Fall Likely orthostatic hypotension - It appears that may be related to hypotension due to multiple episodes of diarrhea.  Pt suffered minor lac to forehead.  Rest of trauma scan neg. --cont MIVF --orthostatic BP when pt able to tolerate being upright --PT/OT   N/V Acute gastroenteritis - Patient reported eating something out of ordinary yesterday - He feels that that might have caused him nausea vomiting and diarrhea overnight - cont MIVF   3.  Developmental disability/mentally challenged - He appears to be at baseline - He is alert awake and oriented - Supportive care   4.  History of Crohn's disease --intermittent mild diarrhea which pt said is his baseline. --Imodium  PRN   DVT prophylaxis: Lovenox  SQ Code Status: Full code  Family Communication: sister updated on the phone today Level of care: Med-Surg Dispo:   The patient is from: [facility]  Anticipated d/c is to: to be determined Anticipated d/c date is: 1-2 days   Subjective and Interval History:  No more N/V.  Had a little bit of diarrhea.  Per PT, pt had orthostatic dizziness and couldn't stand upright long enough for BG check.   Objective: Vitals:   06/07/24 1937 06/08/24 0346 06/08/24 0846 06/08/24 1414  BP:  (!) 137/48 130/70 (!) 129/54 (!) 120/38  Pulse: 71 (!) 104 71 72  Resp: 16 16 16 18   Temp: 98.2 F (36.8 C) 98.2 F (36.8 C) 97.9 F (36.6 C) 98.7 F (37.1 C)  TempSrc:  Oral    SpO2: 99% 99% 98% 100%  Weight:      Height:        Intake/Output Summary (Last 24 hours) at 06/08/2024 1703 Last data filed at 06/08/2024 1300 Gross per 24 hour  Intake 1821.01 ml  Output 800 ml  Net 1021.01 ml   Filed Weights   06/07/24 0749  Weight: 103.4 kg    Examination:   Constitutional: NAD, alert, oriented to person and place HEENT: conjunctivae and lids normal, EOMI CV: No cyanosis.   RESP: normal respiratory effort, on RA Neuro: II - XII grossly intact.   Psych: Normal mood and affect.     Data Reviewed: I have personally reviewed labs and imaging studies  Time spent: 50 minutes  Ellouise Haber, MD [facility] If 7PM-7AM, please contact night-coverage 06/08/2024, 5:03 PM

## 2024-06-08 NOTE — Telephone Encounter (Signed)
 Author called pt's sister/guardian at pt request. Spoke to [facility] who provided 3 pt identifiers. Cathy updated on assessment from PT evaluation and current limitations. Donny would like to be involved with any decision making regarding STR placement shoulder this take place prior to DC.  3:21 PM, 06/08/24 Peggye JAYSON Linear, PT, DPT Physical Therapist - [facility]  [phone] [facility])

## 2024-06-08 NOTE — Evaluation (Signed)
 Physical Therapy Evaluation Patient Details Name: [name] MRN: [id number] DOB: [birth year] Today's Date: 06/08/2024  History of Present Illness  [name] is a 68yoM who comes to [facility] 06/07/24 after a fall, generalized weakness at [facility]. Pt had recent emesis and diarrhea prior to this. Fall described as syncope and collapse, pt hit his head with Rt forehead abrasion, and cut his Rt hand at the interdigital volar webspace at d3/4.PMH: HTN, BPH, crohn's disease, seizure d/o, PUD.  Clinical Impression  Pt in bed on entry, report to still feel quite weak today, was able to AMB to BR earlier with nursing but needed a lot of help. Pt requires heavy effort to get to EOB, assistance with footwear. Pt unsteady upon rising each time. Dizziness worsens with standing and pt is unable to remain standing for orthostatic vitals assessment. Pt able to partake in several STS transfers with elevation, but dizziness remains biggest limitations. Once orthostatic symptoms are improved/ruled out, pt would benefit from further screening of post-concussive dizziness or vestibular assessment if still having dizziness later. Will continue to follow and progress interventions as appropriate and tolerated.       If plan is discharge home, recommend the following:     Can travel by private vehicle   Yes    Equipment Recommendations None recommended by PT  Recommendations for Other Services       Functional Status Assessment Patient has had a recent decline in their functional status and demonstrates the ability to make significant improvements in function in a reasonable and predictable amount of time.     Precautions / Restrictions Precautions Precautions: Fall Restrictions Weight Bearing Restrictions Per Provider Order: No      Mobility  Bed Mobility Overal bed mobility: Modified Independent             General bed mobility comments: endorses acute weakness still, heavy effort, but  able to be successful.    Transfers Overall transfer level: Needs assistance Equipment used: Rolling walker (2 wheels) Transfers: Sit to/from Stand             General transfer comment: unsteady upopn rising, stumbles, appears to be worse with >trunk/neck flexion (downward gaze) prior to coming up. Pt does not tolerate standing long enough to obtain a standing BP    Ambulation/Gait Ambulation/Gait assistance:  (unsafe to attempt, standing tolerance <45sec. Has already AMB to BR once today with NSG .)                Stairs            Wheelchair Mobility     Tilt Bed    Modified Rankin (Stroke Patients Only)       Balance                                             Pertinent Vitals/Pain Pain Assessment Pain Assessment: Faces Faces Pain Scale: Hurts little more Pain Location: Rt hand at stiches location Pain Intervention(s): Limited activity within patient's tolerance, Monitored during session    Home Living Family/patient expects to be discharged to:: Assisted living                 Home Equipment: Rollator (4 wheels)      Prior Function Prior Level of Function : Independent/Modified Independent  Extremity/Trunk Assessment                Communication        Cognition Arousal: Alert Behavior During Therapy: WFL for tasks assessed/performed   PT - Cognitive impairments: No apparent impairments                                 Cueing       General Comments      Exercises Other Exercises Other Exercises: STS from elevated surface x4, RW, minGuard assist   Assessment/Plan    PT Assessment Patient needs continued PT services  PT Problem List Decreased strength;Decreased activity tolerance;Decreased balance;Decreased mobility;Decreased knowledge of use of DME;Decreased safety awareness       PT Treatment Interventions DME instruction;Neuromuscular  re-education;Gait training;Stair training;Functional mobility training;Therapeutic activities;Therapeutic exercise;Balance training;Patient/family education    PT Goals (Current goals can be found in the Care Plan section)  Acute Rehab PT Goals Patient Stated Goal: feel safe in standing PT Goal Formulation: With patient Time For Goal Achievement: 06/22/24 Potential to Achieve Goals: Fair    Frequency Min 2X/week     Co-evaluation               AM-PAC PT 6 Clicks Mobility  Outcome Measure Help needed turning from your back to your side while in a flat bed without using bedrails?: A Little Help needed moving from lying on your back to sitting on the side of a flat bed without using bedrails?: A Little Help needed moving to and from a bed to a chair (including a wheelchair)?: A Little Help needed standing up from a chair using your arms (e.g., wheelchair or bedside chair)?: A Little Help needed to walk in hospital room?: A Lot Help needed climbing 3-5 steps with a railing? : A Lot 6 Click Score: 16    End of Session   Activity Tolerance: Patient tolerated treatment well;No increased pain Patient left: in bed;with call bell/phone within reach;with bed alarm set Nurse Communication: Mobility status PT Visit Diagnosis: Difficulty in walking, not elsewhere classified (R26.2);Other abnormalities of gait and mobility (R26.89);Muscle weakness (generalized) (M62.81);Other symptoms and signs involving the nervous system (R29.898)    Time: 8748-8687 PT Time Calculation (min) (ACUTE ONLY): 21 min   Charges:   PT Evaluation $PT Eval Moderate Complexity: 1 Mod PT Treatments $Therapeutic Activity: 8-22 mins PT General Charges $$ ACUTE PT VISIT: 1 Visit        2:26 PM, 06/08/24 [name], PT, DPT Physical Therapist - [facility]  [phone] (ASCOM)   [name] 06/08/2024, 2:05 PM

## 2024-06-08 NOTE — Plan of Care (Signed)

## 2024-06-09 DIAGNOSIS — W19XXXA Unspecified fall, initial encounter: Secondary | ICD-10-CM | POA: Diagnosis not present

## 2024-06-09 DIAGNOSIS — K5080 Crohn's disease of both small and large intestine without complications: Secondary | ICD-10-CM | POA: Diagnosis not present

## 2024-06-09 MED ORDER — MIDODRINE HCL 5 MG PO TABS
5.0000 mg | ORAL_TABLET | Freq: Two times a day (BID) | ORAL | Status: DC
  Administered 2024-06-09 – 2024-06-10 (×2): 5 mg via ORAL
  Filled 2024-06-09 (×2): qty 1

## 2024-06-09 NOTE — Plan of Care (Signed)

## 2024-06-09 NOTE — Evaluation (Signed)
 Occupational Therapy Evaluation Patient Details Name: [name] MRN: [id number] DOB: 17-Feb-1956 Today's Date: 06/09/2024   History of Present Illness   [name] is a 68yoM who comes to [facility] 06/07/24 after a fall, generalized weakness at [facility]. Pt had recent emesis and diarrhea prior to this. Fall described as syncope and collapse, pt hit his head with Rt forehead abrasion, and cut his Rt hand at the interdigital volar webspace at d3/4.PMH: HTN, BPH, crohn's disease, seizure d/o, PUD.     Clinical Impressions Patient presenting with decreased Ind in self care,balance, functional mobility/transfers, endurance, and safety awareness. Patient reports living at [facility] and using rollator for mobility. Pt endorses staff assist for self care needs as needed. Pt needing max A to don B socks and then supervision for bed mobility this session. Min A to stand from EOB with RW and step pivot to the L to recliner chair with min A. Pt does endorse feeling dizzy but that while sitting in recliner chair it was getting better. Pt did not feel that he could ambulate in room secondary to dizziness this session. Call bell and all needed items within reach upon exiting the room. Patient will benefit from acute OT to increase overall independence in the areas of ADLs, functional mobility, and safety awareness in order to safely discharge.      Functional Status Assessment   Patient has had a recent decline in their functional status and demonstrates the ability to make significant improvements in function in a reasonable and predictable amount of time.     Equipment Recommendations   None recommended by OT      Precautions/Restrictions   Precautions Precautions: Fall Restrictions Weight Bearing Restrictions Per Provider Order: No     Mobility Bed Mobility Overal bed mobility: Needs Assistance Bed Mobility: Supine to Sit     Supine to sit: Modified independent  (Device/Increase time)     General bed mobility comments: increased time and effort but no physical assistance needed    Transfers Overall transfer level: Needs assistance Equipment used: Rolling walker (2 wheels) Transfers: Sit to/from Stand Sit to Stand: Min assist                  Balance Overall balance assessment: Independent                                         ADL either performed or assessed with clinical judgement   ADL Overall ADL's : Needs assistance/impaired     Grooming: Wash/dry Producer, television/film/video: Minimal assistance;Stand-pivot;Rolling walker (2 wheels) Toilet Transfer Details (indicate cue type and reason): simulated                 Vision Baseline Vision/History: 1 Wears glasses Patient Visual Report: No change from baseline              Pertinent Vitals/Pain Pain Assessment Pain Assessment: Faces Faces Pain Scale: Hurts a little bit Pain Location: Rt hand at stiches location Pain Descriptors / Indicators: Discomfort Pain Intervention(s): Limited activity within patient's tolerance, Monitored during session     Extremity/Trunk Assessment Upper Extremity Assessment Upper Extremity Assessment: Generalized weakness           Communication Communication Communication: No apparent difficulties   Cognition Arousal: Alert  Behavior During Therapy: WFL for tasks assessed/performed Cognition: History of cognitive impairments             OT - Cognition Comments: Pt is pleasant and cooperative overall                 Following commands: Impaired Following commands impaired: Follows one step commands with increased time     Cueing  General Comments   Cueing Techniques: Verbal cues              Home Living Family/patient expects to be discharged to:: Assisted living                             Home Equipment: Rollator (4 wheels)           Prior Functioning/Environment Prior Level of Function : Needs assist               ADLs Comments: Pt reports staff assist him with self care tasks    OT Problem List: Decreased strength;Impaired balance (sitting and/or standing);Decreased safety awareness;Decreased activity tolerance;Decreased knowledge of use of DME or AE   OT Treatment/Interventions: Self-care/ADL training;Therapeutic exercise;Patient/family education;Balance training;Energy conservation;Therapeutic activities;DME and/or AE instruction;Cognitive remediation/compensation      OT Goals(Current goals can be found in the care plan section)   Acute Rehab OT Goals Patient Stated Goal: to get stronger OT Goal Formulation: With patient Time For Goal Achievement: 06/23/24 Potential to Achieve Goals: Fair ADL Goals Pt Will Perform Grooming: with modified independence Pt Will Perform Lower Body Dressing: with supervision Pt Will Transfer to Toilet: with supervision Pt Will Perform Toileting - Clothing Manipulation and hygiene: with supervision   OT Frequency:  Min 2X/week       AM-PAC OT 6 Clicks Daily Activity     Outcome Measure Help from another person eating meals?: None Help from another person taking care of personal grooming?: None Help from another person toileting, which includes using toliet, bedpan, or urinal?: A Little Help from another person bathing (including washing, rinsing, drying)?: A Little Help from another person to put on and taking off regular upper body clothing?: A Little Help from another person to put on and taking off regular lower body clothing?: A Little 6 Click Score: 20   End of Session Equipment Utilized During Treatment: Rolling walker (2 wheels)  Activity Tolerance: Patient tolerated treatment well Patient left: in chair;with call bell/phone within reach;with chair alarm set  OT Visit Diagnosis: Unsteadiness on feet (R26.81)                Time: 8957-8943 OT Time  Calculation (min): 14 min Charges:  OT General Charges $OT Visit: 1 Visit OT Evaluation $OT Eval Moderate Complexity: 1 [address] [place], OTR/L , CBIS ascom [phone]  06/09/24, 2:27 PM

## 2024-06-09 NOTE — Progress Notes (Signed)
  PROGRESS NOTE    [name]  FMW:[id number] DOB: August 07, 1956 DOA: 06/07/2024 PCP: Cordella Corning, FNP  223A/223A-AA  LOS: 1 day   Brief hospital course:   Assessment & Plan: [name] is a pleasant 68 y.o. male with medical history significant for HTN, BPH, chron's disease, seizure disorder, peptic ulcer disease, developmental delay who is a resident of [facility], brought in to emergency room after a fall and generalized weakness along with multiple episodes of diarrhea and vomiting.  Patient stated that he ate something yesterday and had nausea vomiting and diarrhea in the night.  He was coming out of his bathroom in his room and he fell as he did not have walker.    1.  Fall Likely orthostatic hypotension - It appears that may be related to hypotension due to multiple episodes of diarrhea.  Pt suffered minor lac to forehead.  Rest of trauma scan neg. --s/p MIVF --check orthostatic BP when pt is able to tolerate being upright   N/V, resolved Acute gastroenteritis - Patient reported eating something out of ordinary the day PTA that might have caused him nausea vomiting and diarrhea overnight --oral hydration now   3.  Developmental disability/mentally challenged - He appears to be at baseline - He is alert awake and oriented - Supportive care   4.  History of Crohn's disease --intermittent mild diarrhea which pt said is his baseline. --Imodium  PRN  Mild fever --100.4 overnight, no associated tachycardia, no symptoms of infection --obtain blood cx --monitor without abx for now   DVT prophylaxis: Lovenox  SQ Code Status: Full code  Family Communication:  Level of care: Med-Surg Dispo:   The patient is from: [employer]  Anticipated d/c is to: SNF rehab Anticipated d/c date is: whenever SNF accepts   Subjective and Interval History:  Mild fever 100.4 overnight.  Pt denied N/V, dysuria, cough.   Objective: Vitals:   06/08/24 2256 06/09/24 0336  06/09/24 0800 06/09/24 1650  BP:  (!) 165/63 (!) 149/56 (!) 153/55  Pulse:  63 67 60  Resp:  16 19 19   Temp: 98.4 F (36.9 C) 98.4 F (36.9 C) 98.2 F (36.8 C) 98.6 F (37 C)  TempSrc: Oral Oral    SpO2:  95% 100% 97%  Weight:      Height:        Intake/Output Summary (Last 24 hours) at 06/09/2024 2015 Last data filed at 06/09/2024 1300 Gross per 24 hour  Intake 1322.5 ml  Output --  Net 1322.5 ml   Filed Weights   06/07/24 0749  Weight: 103.4 kg    Examination:   Constitutional: NAD, alert, oriented  HEENT: conjunctivae and lids normal, EOMI CV: No cyanosis.   RESP: normal respiratory effort, on RA Neuro: II - XII grossly intact.   Psych: Normal mood and affect.     Data Reviewed: I have personally reviewed labs and imaging studies  Time spent: 50 minutes  Ellouise Haber, MD [facility] If 7PM-7AM, please contact night-coverage 06/09/2024, 8:15 PM

## 2024-06-10 DIAGNOSIS — K5080 Crohn's disease of both small and large intestine without complications: Secondary | ICD-10-CM | POA: Diagnosis not present

## 2024-06-10 DIAGNOSIS — W19XXXA Unspecified fall, initial encounter: Secondary | ICD-10-CM | POA: Diagnosis not present

## 2024-06-10 MED ORDER — HYDRALAZINE HCL 20 MG/ML IJ SOLN: 10.0000 mg | Freq: Four times a day (QID) | INTRAMUSCULAR | Status: DC | PRN

## 2024-06-10 MED ORDER — PNEUMOCOCCAL 20-VAL CONJ VACC 0.5 ML IM SUSY
0.5000 mL | PREFILLED_SYRINGE | INTRAMUSCULAR | Status: AC
  Administered 2024-06-11: 0.5 mL via INTRAMUSCULAR
  Filled 2024-06-10: qty 0.5

## 2024-06-10 MED ORDER — ORAL CARE MOUTH RINSE: 15.0000 mL | OROMUCOSAL | Status: DC | PRN

## 2024-06-10 MED ORDER — AMLODIPINE BESYLATE 10 MG PO TABS
10.0000 mg | ORAL_TABLET | Freq: Every day | ORAL | Status: DC
  Administered 2024-06-10 – 2024-06-11 (×2): 10 mg via ORAL
  Filled 2024-06-10 (×2): qty 1

## 2024-06-10 NOTE — Progress Notes (Signed)
 PT Cancellation Note  Patient Details Name: [name] MRN: [id number] DOB: 1956-03-11   Cancelled Treatment:     PT attempt. Pt currently getting bath. Author will return later today and continue to follow per current POC.    Rankin KATHEE Essex 06/10/2024, 10:58 AM

## 2024-06-10 NOTE — Plan of Care (Signed)

## 2024-06-10 NOTE — Progress Notes (Addendum)
  PROGRESS NOTE    [name]  FMW:[id number] DOB: 1956/07/04 DOA: 06/07/2024 PCP: Cordella Corning, FNP  223A/223A-AA  LOS: 2 days   Brief hospital course:   Assessment & Plan: [name] is a pleasant 68 y.o. male with medical history significant for HTN, BPH, chron's disease, seizure disorder, peptic ulcer disease, developmental delay who is a resident of [facility], brought in to emergency room after a fall and generalized weakness along with multiple episodes of diarrhea and vomiting.  Patient stated that he ate something yesterday and had nausea vomiting and diarrhea in the night.  He was coming out of his bathroom in his room and he fell as he did not have walker.    1.  Fall Likely orthostatic hypotension - It appears that may be related to hypotension due to multiple episodes of diarrhea.  Pt suffered minor lac to forehead.  Rest of trauma scan neg. --s/p MIVF --orthostatic BP neg after IVF   N/V, resolved Acute gastroenteritis - Patient reported eating something out of ordinary the day PTA that might have caused him nausea vomiting and diarrhea overnight --oral hydration now   3.  Developmental disability/mentally challenged - He appears to be at baseline - He is alert awake and oriented - Supportive care   4.  History of Crohn's disease --intermittent mild diarrhea which pt said is his baseline. --Imodium  PRN  Mild fever --100.4 night of 8/17, no associated tachycardia, no symptoms of infection --obtained blood cx --monitor without abx for now  Obesity class I, BMI 32.7  Acute metabolic acidosis, resolved --likely due to vomiting   DVT prophylaxis: Lovenox  SQ Code Status: Full code  Family Communication:  Level of care: Med-Surg Dispo:   The patient is from: [employer]  Anticipated d/c is to: Today changed rec from SNF rehab to [facility] Anticipated d/c date is: tomorrow   Subjective and Interval History:  Pt had formed BM today.     PT worked with pt today, found him much improved.  Pt now is deemed safe to return to ALF.  TOC contacted ALF to see if pt can return today, but did not hear back.   Objective: Vitals:   06/09/24 2020 06/10/24 0357 06/10/24 0832 06/10/24 1627  BP: (!) 154/51 (!) 156/68 (!) 177/56 (!) 147/47  Pulse: 63 73 63 61  Resp: 18 20 18 18   Temp: 98.5 F (36.9 C) 98.4 F (36.9 C) 98.6 F (37 C) 98.3 F (36.8 C)  TempSrc:      SpO2: 99% 95% 97% 100%  Weight:      Height:        Intake/Output Summary (Last 24 hours) at 06/10/2024 1917 Last data filed at 06/10/2024 1415 Gross per 24 hour  Intake 960 ml  Output 50 ml  Net 910 ml   Filed Weights   06/07/24 0749  Weight: 103.4 kg    Examination:   Constitutional: NAD, alert, oriented, walking in the halls HEENT: conjunctivae and lids normal, EOMI CV: No cyanosis.   RESP: normal respiratory effort, on RA Neuro: II - XII grossly intact.   Psych: Normal mood and affect.     Data Reviewed: I have personally reviewed labs and imaging studies  Time spent: 25 minutes  Ellouise Haber, MD [facility] If 7PM-7AM, please contact night-coverage 06/10/2024, 7:17 PM

## 2024-06-10 NOTE — TOC Initial Note (Signed)
 Transition of Care [facility]) - Initial/Assessment Note    Patient Details  Name: [name] MRN: [id number] Date of Birth: August 06, 1956  [facility] CM/SW Contact:    Corean ONEIDA Haddock, RN Phone Number: 06/10/2024, 11:57 AM  Clinical Narrative:                  Call placed to sister Donny who is listed as Legal Guardian.  Confirmed with Donny that she is not Legal Guardian she is HPOA, Guardian banner removed from chart   Admitted from: [facility]  Therapy recommending SNF.  3 way call placed with [facility] and patient.  Patient in agreement with SNF and defers decision for SNF placement to be made by Donny Donny is in agreement for SNF.   Existing PASRR Fl2 sent for signature Bed search initiated - sent to [facility] and [facility] in [facility] per [employer] request         Patient Goals and CMS Choice            Expected Discharge Plan and Services                                              Prior Living Arrangements/Services                       Activities of Daily Living   ADL Screening (condition at time of admission) Independently performs ADLs?: No Does the patient have a NEW difficulty with bathing/dressing/toileting/self-feeding that is expected to last >3 days?: No Does the patient have a NEW difficulty with getting in/out of bed, walking, or climbing stairs that is expected to last >3 days?: No Does the patient have a NEW difficulty with communication that is expected to last >3 days?: No Is the patient deaf or have difficulty hearing?: Yes Does the patient have difficulty seeing, even when wearing glasses/contacts?: No Does the patient have difficulty concentrating, remembering, or making decisions?: Yes  Permission Sought/Granted                  Emotional Assessment              Admission diagnosis:  Dehydration [E86.0] Weakness [R53.1] Fall [W19.XXXA] Nausea vomiting and diarrhea [R11.2, R19.7] Fall,  initial encounter [W19.XXXA] Laceration of right hand without foreign body, initial encounter [S61.411A] Laceration of forehead, initial encounter [S01.81XA] Patient Active Problem List   Diagnosis Date Noted   Weakness 06/08/2024   Fall 06/07/2024   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   Postural dizziness with near syncope 02/26/2023   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   Left bundle branch block (LBBB) on electrocardiogram 02/26/2023   Hypotension 12/30/2020   Pressure injury of skin 12/28/2020   Leg edema 07/30/2019   Cholecystitis with cholelithiasis 06/06/2018   Diastolic CHF, chronic (HCC) 04/20/2018   Catheter-associated urinary tract infection (HCC) 04/19/2018   Onychomycosis due to dermatophyte 05/16/2017   Nail dystrophy 05/16/2017   Mental disability    S/P colostomy takedown 08/01/2016   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)    AKI (acute kidney injury) (HCC) 12/01/2015   Sepsis (HCC) 12/01/2015   Localization-related idiopathic epilepsy and epileptic syndromes with seizures of localized onset, not intractable, without status epilepticus (HCC) 03/30/2015   Nonrheumatic aortic valve stenosis 03/31/2014   Nonrheumatic aortic  valve insufficiency 03/31/2014   Pseudopolyposis of colon (HCC) 01/03/2012   Crohn's disease (HCC) 12/19/2011   Personal history of immunosupression therapy 12/19/2011   ECZEMA 08/21/2008   CHOLELITHIASIS, ASYMPTOMATIC 01/29/2008   VITAMIN B12 DEFICIENCY 01/17/2008   PCP:  Cordella Corning, FNP Pharmacy:  No Pharmacies Listed    Social Drivers of Health (SDOH) Social History: SDOH Screenings   Food Insecurity: No Food Insecurity (06/07/2024)  Housing: Low Risk  (06/07/2024)  Transportation Needs: No Transportation Needs (06/07/2024)  Utilities: Not At Risk (06/07/2024)  Social Connections: Moderately Isolated (06/09/2024)  Tobacco Use: Low Risk  (06/07/2024)   SDOH Interventions:     Readmission Risk  Interventions     No data to display

## 2024-06-10 NOTE — Progress Notes (Signed)
 Physical Therapy Treatment Patient Details Name: [name] MRN: [id number] DOB: [birth year] Today's Date: 06/10/2024   History of Present Illness [name] is a 68yoM who comes to [facility] 06/07/24 after a fall, generalized weakness at [facility]. Pt had recent emesis and diarrhea prior to this. Fall described as syncope and collapse, pt hit his head with Rt forehead abrasion, and cut his Rt hand at the interdigital volar webspace at d3/4.PMH: HTN, BPH, crohn's disease, seizure d/o, PUD.    PT Comments  Pt was long sitting in bed watching TV upon arrival. He is alert and oriented x 3. Poor awareness of situation however per sister has cognition deficits normally. Pt was able to demonstrate safe abilities to exit bed, stand to RW, and tolerate ambulation 400 ft in hallway. No physical assistance required or symptoms of hypotension reported.  Author called pt's POA (sister) to discuss updated DC recs. Author feels, pt will benefit from continued skilled PT at DC to maximize independence and safety with all ADLs     If plan is discharge home, recommend the following: A little help with walking and/or transfers;A little help with bathing/dressing/bathroom;Assistance with cooking/housework;Direct supervision/assist for medications management;Direct supervision/assist for financial management;Assist for transportation;Help with stairs or ramp for entrance;Supervision due to cognitive status     Equipment Recommendations  None recommended by PT (pt uses rollator at baseline)       Precautions / Restrictions Precautions Precautions: Fall Recall of Precautions/Restrictions: Intact Restrictions Weight Bearing Restrictions Per Provider Order: No     Mobility  Bed Mobility Overal bed mobility: Modified Independent Bed Mobility: Supine to Sit, Sit to Supine  Supine to sit: Modified independent (Device/Increase time) Sit to supine: Modified independent (Device/Increase time)    Transfers Overall transfer level: Needs assistance Equipment used: Rolling walker (2 wheels) Transfers: Sit to/from Stand Sit to Stand: Modified independent (Device/Increase time)   Ambulation/Gait Ambulation/Gait assistance: Modified independent (Device/Increase time) Gait Distance (Feet): 400 Feet Assistive device: Rolling walker (2 wheels) Gait Pattern/deviations: Step-through pattern Gait velocity: decreased  General Gait Details: pt demonstrated safe steady gait without LOB or safety concern. Ambulated 2 laps in hallway.    Balance Overall balance assessment: Modified Independent       Communication Communication Communication: No apparent difficulties  Cognition Arousal: Alert Behavior During Therapy: WFL for tasks assessed/performed   PT - Cognitive impairments: History of cognitive impairments (per sister developmentally delayed)      PT - Cognition Comments: Pt is alert and pleasant. follows commands consistently throughout but does lack awareness of situation Following commands: Intact Following commands impaired: Follows one step commands with increased time    Cueing Cueing Techniques: Verbal cues         Pertinent Vitals/Pain Pain Assessment Pain Assessment: No/denies pain     PT Goals (current goals can now be found in the care plan section) Acute Rehab PT Goals Patient Stated Goal: go home(ALF [facility]) Progress towards PT goals: Progressing toward goals    Frequency    Min 2X/week          AM-PAC PT 6 Clicks Mobility   Outcome Measure  Help needed turning from your back to your side while in a flat bed without using bedrails?: None Help needed moving from lying on your back to sitting on the side of a flat bed without using bedrails?: None Help needed moving to and from a bed to a chair (including a wheelchair)?: None Help needed standing up from a chair using  your arms (e.g., wheelchair or bedside chair)?: A Little Help needed  to walk in hospital room?: A Little Help needed climbing 3-5 steps with a railing? : A Little 6 Click Score: 21    End of Session   Activity Tolerance: Patient tolerated treatment well Patient left: in bed;with call bell/phone within reach;with bed alarm set Nurse Communication: Mobility status PT Visit Diagnosis: Difficulty in walking, not elsewhere classified (R26.2);Other abnormalities of gait and mobility (R26.89);Muscle weakness (generalized) (M62.81);Other symptoms and signs involving the nervous system (R29.898)     Time: 8484-8468 PT Time Calculation (min) (ACUTE ONLY): 16 min  Charges:    $Gait Training: 8-22 mins PT General Charges $$ ACUTE PT VISIT: 1 Visit                     Rankin Essex PTA 06/10/24, 4:18 PM

## 2024-06-10 NOTE — NC FL2 (Signed)
 [place]  MEDICAID FL2 LEVEL OF CARE FORM     IDENTIFICATION  Patient Name: [name] Birthdate: 03-Apr-1956 Sex: male Admission Date (Current Location): 06/07/2024  [facility] and [place] Number:  Chiropodist and Address:         Provider Number: [id number]  Attending Physician Name and Address:  Awanda City, MD  Relative Name and Phone Number:       Current Level of Care: Hospital Recommended Level of Care: Skilled Nursing Facility Prior Approval Number:    Date Approved/Denied:   PASRR Number: [id number]  Discharge Plan: SNF    Current Diagnoses: Patient Active Problem List   Diagnosis Date Noted   Weakness 06/08/2024   Fall 06/07/2024   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   Postural dizziness with near syncope 02/26/2023   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   Left bundle branch block (LBBB) on electrocardiogram 02/26/2023   Hypotension 12/30/2020   Pressure injury of skin 12/28/2020   Leg edema 07/30/2019   Cholecystitis with cholelithiasis 06/06/2018   Diastolic CHF, chronic (HCC) 04/20/2018   Catheter-associated urinary tract infection (HCC) 04/19/2018   Onychomycosis due to dermatophyte 05/16/2017   Nail dystrophy 05/16/2017   Mental disability    S/P colostomy takedown 08/01/2016   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)    AKI (acute kidney injury) (HCC) 12/01/2015   Sepsis (HCC) 12/01/2015   Localization-related idiopathic epilepsy and epileptic syndromes with seizures of localized onset, not intractable, without status epilepticus (HCC) 03/30/2015   Nonrheumatic aortic valve stenosis 03/31/2014   Nonrheumatic aortic valve insufficiency 03/31/2014   Pseudopolyposis of colon (HCC) 01/03/2012   Crohn's disease (HCC) 12/19/2011   Personal history of immunosupression therapy 12/19/2011   ECZEMA 08/21/2008   CHOLELITHIASIS, ASYMPTOMATIC 01/29/2008   VITAMIN B12 DEFICIENCY 01/17/2008    Orientation RESPIRATION  BLADDER Height & Weight     Self, Time, Situation, Place (Developmental Delay)  Normal Incontinent Weight: 103.4 kg Height:  5' 10 (177.8 cm)  BEHAVIORAL SYMPTOMS/MOOD NEUROLOGICAL BOWEL NUTRITION STATUS      Incontinent Diet (regular)  AMBULATORY STATUS COMMUNICATION OF NEEDS Skin   Extensive Assist Verbally Skin abrasions                       Personal Care Assistance Level of Assistance              Functional Limitations Info             SPECIAL CARE FACTORS FREQUENCY  PT (By licensed PT), OT (By licensed OT)                    Contractures Contractures Info: Not present    Additional Factors Info  Code Status, Allergies Code Status Info: fukk Allergies Info: Other, Propoxyphene           Current Medications (06/10/2024):  This is the current hospital active medication list Current Facility-Administered Medications  Medication Dose Route Frequency Provider Last Rate Last Admin   acetaminophen  (TYLENOL ) tablet 650 mg  650 mg Oral Q6H PRN Paudel, Keshab, MD       Or   acetaminophen  (TYLENOL ) suppository 650 mg  650 mg Rectal Q6H PRN Paudel, Nena, MD       amLODipine  (NORVASC ) tablet 10 mg  10 mg Oral Daily Awanda City, MD       azaTHIOprine  (IMURAN ) tablet 50 mg  50 mg Oral Daily Paudel, Keshab, MD   50 mg  at 06/10/24 0948   cholecalciferol  (VITAMIN D3) 25 MCG (1000 UNIT) tablet 1,000 Units  1,000 Units Oral Daily Paudel, Keshab, MD   1,000 Units at 06/10/24 0948   [START ON 06/16/2024] cyanocobalamin  (VITAMIN B12) injection 1,000 mcg  1,000 mcg Intramuscular Q30 days Paudel, Keshab, MD       enoxaparin  (LOVENOX ) injection 52.5 mg  0.5 mg/kg Subcutaneous Q24H Paudel, Keshab, MD   52.5 mg at 06/09/24 2112   famotidine  (PEPCID ) tablet 40 mg  40 mg Oral Daily Paudel, Keshab, MD   40 mg at 06/10/24 0947   fluticasone  (FLONASE ) 50 MCG/ACT nasal spray 1 spray  1 spray Each Nare Daily Paudel, Keshab, MD   1 spray at 06/10/24 0948   folic acid  (FOLVITE ) tablet  1 mg  1 mg Oral Daily Paudel, Keshab, MD   1 mg at 06/10/24 9051   hydrALAZINE  (APRESOLINE ) injection 10 mg  10 mg Intravenous Q6H PRN Awanda City, MD       levETIRAcetam  (KEPPRA ) tablet 500 mg  500 mg Oral Daily Paudel, Keshab, MD   500 mg at 06/10/24 9051   loperamide  (IMODIUM ) capsule 2 mg  2 mg Oral PRN Paudel, Keshab, MD   2 mg at 06/08/24 1019   mesalamine  (PENTASA ) CR capsule 1,000 mg  1,000 mg Oral QID Paudel, Keshab, MD   1,000 mg at 06/10/24 0947   ondansetron  (ZOFRAN ) tablet 4 mg  4 mg Oral Q6H PRN Paudel, Keshab, MD       Or   ondansetron  (ZOFRAN ) injection 4 mg  4 mg Intravenous Q6H PRN Paudel, Keshab, MD       Oral care mouth rinse  15 mL Mouth Rinse PRN Awanda City, MD       NOREEN ON 06/11/2024] pneumococcal 20-valent conjugate vaccine (PREVNAR 20 ) injection 0.5 mL  0.5 mL Intramuscular Tomorrow-1000 Awanda City, MD       tamsulosin  (FLOMAX ) capsule 0.4 mg  0.4 mg Oral Daily Paudel, Keshab, MD   0.4 mg at 06/10/24 9051     Discharge Medications: Please see discharge summary for a list of discharge medications.  Relevant Imaging Results:  Relevant Lab Results:   Additional Information SSN [id number].   Corean ONEIDA Haddock, RN

## 2024-06-11 ENCOUNTER — Other Ambulatory Visit: Payer: Self-pay

## 2024-06-11 ENCOUNTER — Encounter: Payer: Self-pay | Admitting: Family Medicine

## 2024-06-11 DIAGNOSIS — I5032 Chronic diastolic (congestive) heart failure: Secondary | ICD-10-CM

## 2024-06-11 DIAGNOSIS — K5080 Crohn's disease of both small and large intestine without complications: Secondary | ICD-10-CM | POA: Diagnosis not present

## 2024-06-11 DIAGNOSIS — R42 Dizziness and giddiness: Secondary | ICD-10-CM

## 2024-06-11 DIAGNOSIS — R600 Localized edema: Secondary | ICD-10-CM | POA: Diagnosis not present

## 2024-06-11 DIAGNOSIS — K50919 Crohn's disease, unspecified, with unspecified complications: Secondary | ICD-10-CM

## 2024-06-11 DIAGNOSIS — R55 Syncope and collapse: Secondary | ICD-10-CM

## 2024-06-11 DIAGNOSIS — I447 Left bundle-branch block, unspecified: Secondary | ICD-10-CM

## 2024-06-11 DIAGNOSIS — E538 Deficiency of other specified B group vitamins: Secondary | ICD-10-CM

## 2024-06-11 DIAGNOSIS — F79 Unspecified intellectual disabilities: Secondary | ICD-10-CM

## 2024-06-11 DIAGNOSIS — R531 Weakness: Secondary | ICD-10-CM

## 2024-06-11 MED ORDER — AMLODIPINE BESYLATE 10 MG PO TABS
10.0000 mg | ORAL_TABLET | Freq: Every day | ORAL | 0 refills | Status: DC
  Filled 2024-06-11: qty 30, 30d supply, fill #0

## 2024-06-11 NOTE — Progress Notes (Signed)
 Discharge instructions with new medication delivered from [facility] pharmacy in discharge packet to be transported with patient. IV removed without complications. Patient transporting back to [facility] by facility transportation. Patient belongings to go with patient at time of transport.Hand off report given to [employer], Charity fundraiser at [employer].

## 2024-06-11 NOTE — NC FL2 (Signed)
 [place]  MEDICAID FL2 LEVEL OF CARE FORM     IDENTIFICATION  Patient Name: [name] Birthdate: 05/16/1956 Sex: male Admission Date (Current Location): 06/07/2024  [facility] and [place] Number:  Chiropodist and Address:         Provider Number: [id number]  Attending Physician Name and Address:  Leesa Kast, DO  Relative Name and Phone Number:       Current Level of Care: Hospital Recommended Level of Care: Assisted Living Facility Prior Approval Number:    Date Approved/Denied:   PASRR Number: [id number]  Discharge Plan: Other (Comment) (ALF)    Current Diagnoses: Patient Active Problem List   Diagnosis Date Noted   Weakness 06/08/2024   Fall 06/07/2024   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   Postural dizziness with near syncope 02/26/2023   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   Left bundle branch block (LBBB) on electrocardiogram 02/26/2023   Hypotension 12/30/2020   Pressure injury of skin 12/28/2020   Leg edema 07/30/2019   Cholecystitis with cholelithiasis 06/06/2018   Diastolic CHF, chronic (HCC) 04/20/2018   Catheter-associated urinary tract infection (HCC) 04/19/2018   Onychomycosis due to dermatophyte 05/16/2017   Nail dystrophy 05/16/2017   Mental disability    S/P colostomy takedown 08/01/2016   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)    AKI (acute kidney injury) (HCC) 12/01/2015   Sepsis (HCC) 12/01/2015   Localization-related idiopathic epilepsy and epileptic syndromes with seizures of localized onset, not intractable, without status epilepticus (HCC) 03/30/2015   Nonrheumatic aortic valve stenosis 03/31/2014   Nonrheumatic aortic valve insufficiency 03/31/2014   Pseudopolyposis of colon (HCC) 01/03/2012   Crohn's disease (HCC) 12/19/2011   Personal history of immunosupression therapy 12/19/2011   ECZEMA 08/21/2008   CHOLELITHIASIS, ASYMPTOMATIC 01/29/2008   VITAMIN B12 DEFICIENCY 01/17/2008     Orientation RESPIRATION BLADDER Height & Weight     Self, Time, Situation, Place  Normal Incontinent Weight: 103.4 kg Height:  5' 10 (177.8 cm)  BEHAVIORAL SYMPTOMS/MOOD NEUROLOGICAL BOWEL NUTRITION STATUS      Continent Diet (regular)  AMBULATORY STATUS COMMUNICATION OF NEEDS Skin   Supervision Verbally Skin abrasions                       Personal Care Assistance Level of Assistance              Functional Limitations Info             SPECIAL CARE FACTORS FREQUENCY  PT (By licensed PT), OT (By licensed OT)                    Contractures Contractures Info: Not present    Additional Factors Info  Code Status, Allergies Code Status Info: Full Allergies Info: Other, Propoxyphene           Medication List       PAUSE taking these medications     midodrine  5 MG tablet Wait to take this until your doctor or other care provider tells you to start again. Commonly known as: PROAMATINE  Take 5 mg by mouth at bedtime.    midodrine  10 MG tablet Wait to take this until your doctor or other care provider tells you to start again. Commonly known as: PROAMATINE  Take 1 tablet by mouth 2 (two) times daily.           STOP taking these medications     cetaphil lotion    loratadine   10 MG tablet Commonly known as: CLARITIN            TAKE these medications     acetaminophen  500 MG tablet Commonly known as: TYLENOL  Take 1,000 mg by mouth in the morning, at noon, and at bedtime.    amLODipine  10 MG tablet Commonly known as: NORVASC  Take 1 tablet (10 mg total) by mouth daily. Start taking on: June 12, 2024    azaTHIOprine  50 MG tablet Commonly known as: IMURAN  Take 1 tablet (50 mg total) by mouth daily.    cetirizine 10 MG tablet Commonly known as: ZYRTEC Take 10 mg by mouth every morning.    cholecalciferol  25 MCG (1000 UNIT) tablet Commonly known as: VITAMIN D3 Take 1,000 Units by mouth daily.    cyanocobalamin  1000 MCG/ML  injection Commonly known as: VITAMIN B12 Inject 1,000 mcg into the muscle every 30 (thirty) days.    Dermacloud Oint Apply topically. Apply to sacrum topically every 8 hours as needed for redness/irritation    famotidine  40 MG tablet Commonly known as: PEPCID  Take 40 mg by mouth daily.    ferrous sulfate  325 (65 FE) MG tablet Take 325 mg by mouth daily with breakfast. Mon and Th    fluticasone  50 MCG/ACT nasal spray Commonly known as: FLONASE  Place 1 spray into both nostrils daily.    folic acid  1 MG tablet Commonly known as: FOLVITE  Take 1 mg by mouth daily.    levETIRAcetam  500 MG tablet Commonly known as: KEPPRA  Take 1 tablet daily What changed:  how much to take how to take this when to take this    loperamide  2 MG capsule Commonly known as: IMODIUM  Take 2 mg by mouth as needed for diarrhea or loose stools.    mesalamine  500 MG CR capsule Commonly known as: PENTASA  Take 2 capsules (1,000 mg total) by mouth 4 (four) times daily. Opened in applesauce What changed: additional instructions    PreserVision AREDS 2 Chew Chew 1 tablet by mouth 2 (two) times daily.    primidone  250 MG tablet Commonly known as: MYSOLINE  Take 1 tab in AM, 2 tabs in PM What changed:  how much to take how to take this when to take this    Stelara  45 MG/0.5ML injection Generic drug: ustekinumab  Inject 0.5 mLs (45 mg total) into the skin every 8 (eight) weeks.    tamsulosin  0.4 MG Caps capsule Commonly known as: FLOMAX  Take 1 capsule (0.4 mg total) by mouth daily.    triamcinolone  lotion 0.1 % Commonly known as: KENALOG  Apply 1 Application topically daily as needed (Dryness ot irritation).    Yesintek  45 MG/0.5ML Sosy Generic drug: Ustekinumab -kfce Inject 45 mg into the skin every 8 (eight) weeks.     Relevant Imaging Results:  Relevant Lab Results:   Additional Information SSN [id number].   Corean ONEIDA Haddock, RN

## 2024-06-11 NOTE — Care Management Important Message (Signed)
 Important Message  Patient Details  Name: [name] MRN: [id number] Date of Birth: [birth year]   Important Message Given:  Yes - Medicare IM     Rojelio [name] 06/11/2024, 12:32 PM

## 2024-06-11 NOTE — TOC Transition Note (Signed)
 Transition of Care [facility]) - Discharge Note   Patient Details  Name: [name] MRN: [id number] Date of Birth: 1956/09/17  Transition of [facility]) CM/SW Contact:  Corean ONEIDA Haddock, RN Phone Number: 06/11/2024, 2:31 PM   Clinical Narrative:      Patient has progressed with therapy and appropriate to return back to [facility].  Patient and sister Donny in agreement  Per [place] at [facility] patient can return today.  Fl2 and dc summary secure emailed to [employer]  Bedside RN to call report to facility Facility to transport at discharge.  Sister Donny updated  Patient to resume home health services through [facility].  [place]  with [facility] notified of dc        Patient Goals and CMS Choice            Discharge Placement                       Discharge Plan and Services Additional resources added to the After Visit Summary for                                       Social Drivers of Health (SDOH) Interventions SDOH Screenings   Food Insecurity: No Food Insecurity (06/07/2024)  Housing: Low Risk  (06/07/2024)  Transportation Needs: No Transportation Needs (06/07/2024)  Utilities: Not At Risk (06/07/2024)  Social Connections: Moderately Isolated (06/09/2024)  Tobacco Use: Low Risk  (06/07/2024)     Readmission Risk Interventions     No data to display

## 2024-06-11 NOTE — Discharge Summary (Addendum)
 DISCHARGE SUMMARY    [name] FMW:[id number] DOB: 27-Jan-1956 DOA: 06/07/2024  PCP: Cordella Corning, FNP  Admit date: 06/07/2024 Discharge date: 06/11/2024   Recommendations for Outpatient Follow-up:  Follow up with PCP in 1-2 weeks to review your blood pressure log and make further medication changes  Hospital Course: [name] 68 year old male with hypertension, BPH, Crohn's disease, seizure disorder, peptic ulcer disease, intellectual delay, who presents to the ED with a fall and generalized weakness from [facility].  Prior to his fall patient has been suffering from gastroenteritis with diarrhea and vomiting.  Gastroenteritis self resolved.  It was thought that his fall may be secondary to hypotension given volume loss.  He received IV fluids and then transitioned to p.o.  Patient does appear to have a prescription for midodrine  outpatient however blood pressure has been elevated throughout his stay and he has actually required antihypertensives.  He will require close monitoring at home to determine his antihypertensive needs.   Fall -May have been precipitated by hypotension due to volume loss from diarrhea and vomiting. - Status post minor lac to forehead and hand.  Will need hand suture removal. Suture removal: Will be needed by 8/26 --s/p MIVF --orthostatic BP neg after IVF   Hypertension - Patient has history of hypotension requiring midodrine .  While admitted his blood pressure has been elevated.  He was started on amlodipine .  Midodrine  discontinued. - Recommend keeping a blood pressure log to follow-up outpatient with PCP for further antihypertensive management.  N/V, resolved Acute gastroenteritis - Patient reported eating something out of ordinary the day PTA that might have caused him nausea vomiting and diarrhea overnight -Tolerating p.o. now without issue   Developmental disability - At baseline now   History of Crohn's disease --intermittent mild  diarrhea which pt said is his baseline.   Mild fever --100.4 night of 8/17, no associated tachycardia, no symptoms of infection -- Cultures negative to date.  No further fever.  Obesity class I, BMI 32.7 - Outpatient follow up for lifestyle modification and risk factor management  Acute metabolic acidosis, resolved --likely due to vomiting   Discharge Instructions  Discharge Instructions     Call MD for:  difficulty breathing, headache or visual disturbances   Complete by: As directed    Call MD for:  persistant dizziness or light-headedness   Complete by: As directed    Call MD for:  persistant nausea and vomiting   Complete by: As directed    Call MD for:  severe uncontrolled pain   Complete by: As directed    Call MD for:  temperature >100.4   Complete by: As directed    Diet general   Complete by: As directed    Discharge instructions   Complete by: As directed    While admitted we made some changes to your blood pressure medications. Please review your discharge medications closely to ensure you are taking the correct meds at home. Please take your blood pressure once a day and keep a log. See your primary care doctor in one week to review this log and make further medication changes.   Increase activity slowly   Complete by: As directed       Allergies as of 06/11/2024       Reactions   Other Other (See Comments)   Ask   Propoxyphene Other (See Comments)   Unknown reaction        Medication List     PAUSE taking these medications  midodrine  5 MG tablet Wait to take this until your doctor or other care provider tells you to start again. Commonly known as: PROAMATINE  Take 5 mg by mouth at bedtime.   midodrine  10 MG tablet Wait to take this until your doctor or other care provider tells you to start again. Commonly known as: PROAMATINE  Take 1 tablet by mouth 2 (two) times daily.       STOP taking these medications    cetaphil lotion    loratadine  10 MG tablet Commonly known as: CLARITIN        TAKE these medications    acetaminophen  500 MG tablet Commonly known as: TYLENOL  Take 1,000 mg by mouth in the morning, at noon, and at bedtime.   amLODipine  10 MG tablet Commonly known as: NORVASC  Take 1 tablet (10 mg total) by mouth daily. Start taking on: June 12, 2024   azaTHIOprine  50 MG tablet Commonly known as: IMURAN  Take 1 tablet (50 mg total) by mouth daily.   cetirizine 10 MG tablet Commonly known as: ZYRTEC Take 10 mg by mouth every morning.   cholecalciferol  25 MCG (1000 UNIT) tablet Commonly known as: VITAMIN D3 Take 1,000 Units by mouth daily.   cyanocobalamin  1000 MCG/ML injection Commonly known as: VITAMIN B12 Inject 1,000 mcg into the muscle every 30 (thirty) days.   Dermacloud Oint Apply topically. Apply to sacrum topically every 8 hours as needed for redness/irritation   famotidine  40 MG tablet Commonly known as: PEPCID  Take 40 mg by mouth daily.   ferrous sulfate  325 (65 FE) MG tablet Take 325 mg by mouth daily with breakfast. Mon and Th   fluticasone  50 MCG/ACT nasal spray Commonly known as: FLONASE  Place 1 spray into both nostrils daily.   folic acid  1 MG tablet Commonly known as: FOLVITE  Take 1 mg by mouth daily.   levETIRAcetam  500 MG tablet Commonly known as: KEPPRA  Take 1 tablet daily What changed:  how much to take how to take this when to take this   loperamide  2 MG capsule Commonly known as: IMODIUM  Take 2 mg by mouth as needed for diarrhea or loose stools.   mesalamine  500 MG CR capsule Commonly known as: PENTASA  Take 2 capsules (1,000 mg total) by mouth 4 (four) times daily. Opened in applesauce What changed: additional instructions   PreserVision AREDS 2 Chew Chew 1 tablet by mouth 2 (two) times daily.   primidone  250 MG tablet Commonly known as: MYSOLINE  Take 1 tab in AM, 2 tabs in PM What changed:  how much to take how to take this when to take  this   Stelara  45 MG/0.5ML injection Generic drug: ustekinumab  Inject 0.5 mLs (45 mg total) into the skin every 8 (eight) weeks.   tamsulosin  0.4 MG Caps capsule Commonly known as: FLOMAX  Take 1 capsule (0.4 mg total) by mouth daily.   triamcinolone  lotion 0.1 % Commonly known as: KENALOG  Apply 1 Application topically daily as needed (Dryness ot irritation).   Yesintek  45 MG/0.5ML Sosy Generic drug: Ustekinumab -kfce Inject 45 mg into the skin every 8 (eight) weeks.        Allergies  Allergen Reactions   Other Other (See Comments)    Ask   Propoxyphene Other (See Comments)    Unknown reaction    Consultations:    Procedures/Studies: DG Knee 2 Views Right Result Date: 06/07/2024 CLINICAL DATA:  Clemens this morning EXAM: RIGHT KNEE - 1-2 VIEW COMPARISON:  None Available. FINDINGS: Frontal and cross-table lateral views of the right knee  are obtained. No fracture, subluxation, or dislocation. Mild 3 compartmental osteoarthritis. No joint effusion. The soft tissues are unremarkable. IMPRESSION: 1. Mild osteoarthritis.  No acute fracture. Electronically Signed   By: Ozell Daring M.D.   On: 06/07/2024 08:52   DG Chest 2 View Result Date: 06/07/2024 EXAM: 2 VIEW(S) XRAY OF THE CHEST 06/07/2024 08:39:00 AM COMPARISON: 12/27/2020 low lung volumes. CLINICAL HISTORY: Fall. Pt from [facility] via EMS for fall this am - got weak coming out of bathroom. 1 lac to forehead and skin tear on right 3rd finger noted. Pt AOX4, reports N/V/D last night after a snack. Pt c/o of pain at head wound only. Pt on thinners per [facility] staff but none listed in med rec form per EMS. Pt reports generalized weakness, no dizziness or numbness/tingling. FINDINGS: LUNGS AND PLEURA: No focal pulmonary opacity. No pulmonary edema. No pleural effusion. No pneumothorax. HEART AND MEDIASTINUM: No acute abnormality of the cardiac and mediastinal silhouettes. BONES AND SOFT TISSUES: No acute osseous abnormality.  Bridging osteophytes identified along the anterior aspect of the thoracic spine IMPRESSION: 1. No acute process. Electronically signed by: Waddell Calk MD 06/07/2024 08:51 AM EDT RP Workstation: [id number]   DG Hip Unilat With Pelvis 2-3 Views Right Result Date: 06/07/2024 CLINICAL DATA:  Clemens, weakness EXAM: DG HIP (WITH OR WITHOUT PELVIS) 2-3V RIGHT COMPARISON:  None Available. FINDINGS: Frontal view of the pelvis as well as frontal and frogleg lateral views of the right hip are obtained. No fracture, subluxation, or dislocation. Mild symmetrical bilateral hip osteoarthritis. Moderate lower lumbar spondylosis. Sacroiliac joints are unremarkable. IMPRESSION: 1. No acute displaced fracture. 2. Degenerative changes of the lumbar spine and bilateral hips. Electronically Signed   By: Ozell Daring M.D.   On: 06/07/2024 08:51   DG Hand 2 View Right Result Date: 06/07/2024 EXAM: 1 or 2 VIEW(S) XRAY OF THE RIGHT HAND 06/07/2024 08:39:00 AM COMPARISON: None available. CLINICAL HISTORY: Fall. Pt from [facility] via EMS for fall this am - got weak coming out of bathroom. 1 lac to forehead and skin tear on right 3rd finger noted. Pt AOX4, reports N/V/D last night after a snack. Pt c/o of pain at head wound only. Pt on thinners per [facility] staff but none listed in med rec form per EMS. 105; 98%; 130/62; BGL-119; Pt reports generalized weakness, no dizziness or numbness/tingling. FINDINGS: BONES AND JOINTS: No acute fracture. No focal osseous lesion. No joint dislocation. SOFT TISSUES: The soft tissues are unremarkable. IMPRESSION: 1. No acute osseous abnormality. Electronically signed by: Waddell Calk MD 06/07/2024 08:49 AM EDT RP Workstation: [id number]   CT Cervical Spine Wo Contrast Result Date: 06/07/2024 EXAM: CT CERVICAL SPINE WITHOUT CONTRAST 06/07/2024 08:25:58 AM TECHNIQUE: CT of the cervical spine was performed without the administration of intravenous contrast. Multiplanar reformatted images  are provided for review. Automated exposure control, iterative reconstruction, and/or weight based adjustment of the mA/kV was utilized to reduce the radiation dose to as low as reasonably achievable. COMPARISON: None available. CLINICAL HISTORY: Neck trauma (Age >= 65y). Pt from [facility] via ems for fall this am- got weak coming out of bathroom. 1 lac to forehead and skin tear on right 3rd finger noted. Pt AOX4, reports N/V/D last night after a snack. Pt c/o of pain at head wound only. Pt on thinners per [facility] staff. FINDINGS: CERVICAL SPINE: BONES AND ALIGNMENT: No acute fracture or traumatic malalignment. Fusion of anterior osteophytes C3-7 is stable. DEGENERATIVE CHANGES: Degenerative adjacent level disease is  similar to prior studies. SOFT TISSUES: No prevertebral soft tissue swelling. Multinodular goiter is stable. No significant new or enlarging nodules are present. IMPRESSION: 1. No acute abnormality of the cervical spine related to the reported neck trauma. Electronically signed by: Lonni Necessary MD 06/07/2024 08:39 AM EDT RP Workstation: [id number]   CT Head Wo Contrast Result Date: 06/07/2024 EXAM: CT HEAD WITHOUT CONTRAST 06/07/2024 08:25:58 AM TECHNIQUE: CT of the head was performed without the administration of intravenous contrast. Automated exposure control, iterative reconstruction, and/or weight based adjustment of the mA/kV was utilized to reduce the radiation dose to as low as reasonably achievable. COMPARISON: CT head without contrast 04/12/2018. CLINICAL HISTORY: Head trauma, minor (Age >= 65y). Pt from [facility] via ems for fall this am- got weak coming out of bathroom. 1 lac to forehead and skin tear on right 3rd finger noted. Pt c/o of pain at head wound only. FINDINGS: BRAIN AND VENTRICLES: No acute hemorrhage. Gray-white differentiation is preserved. No hydrocephalus. No extra-axial collection. No mass effect or midline shift. Mild periventricular white  matter hypoattenuation is present. ORBITS: No acute abnormality. SINUSES: No acute abnormality. SOFT TISSUES AND SKULL: Left supraorbital scalp soft tissue swelling is present. No underlying fracture or foreign bodies present. VASCULATURE: Atherosclerotic calcifications are present in the cavernous carotid arteries bilaterally. No hyperdense vessel is present. IMPRESSION: 1. No acute intracranial abnormality. 2. Left supraorbital scalp soft tissue swelling without underlying fracture or foreign bodies. Electronically signed by: Lonni Necessary MD 06/07/2024 08:31 AM EDT RP Workstation: [id number]      Discharge Exam: Vitals:   06/11/24 0319 06/11/24 0842  BP: (!) 148/60 (!) 168/53  Pulse: 66 68  Resp: 18 18  Temp: 98.9 F (37.2 C) 98.2 F (36.8 C)  SpO2: 98% 95%   Vitals:   06/10/24 1627 06/10/24 1933 06/11/24 0319 06/11/24 0842  BP: (!) 147/47 (!) 163/60 (!) 148/60 (!) 168/53  Pulse: 61 67 66 68  Resp: 18 18 18 18   Temp: 98.3 F (36.8 C) 98 F (36.7 C) 98.9 F (37.2 C) 98.2 F (36.8 C)  TempSrc:      SpO2: 100% 100% 98% 95%  Weight:      Height:        Constitutional:  Normal appearance. Non toxic-appearing.  HENT: Head Normocephalic and atraumatic.  Mucous membranes are moist.  Eyes:  Extraocular intact. Conjunctivae normal.  Cardiovascular: Rate and Rhythm: Normal rate and regular rhythm.  Pulmonary: Non labored, symmetric rise of chest wall.  Skin: Forehead Lac well-healing, no erythema, no oozing or draining.  Hand laceration with 2 sutures visible.  Mild erythema surrounding suture site. Neurological: No focal deficit present. alert. Oriented.  Psychiatric: Mood and Affect congruent.    The results of significant diagnostics from this hospitalization (including imaging, microbiology, ancillary and laboratory) are listed below for reference.     Microbiology: Recent Results (from the past 240 hours)  Resp panel by RT-PCR (RSV, Flu A&B, Covid) Anterior Nasal  Swab     Status: None   Collection Time: 06/07/24  9:24 AM   Specimen: Anterior Nasal Swab  Result Value Ref Range Status   SARS Coronavirus 2 by RT PCR NEGATIVE NEGATIVE Final    Comment: (NOTE) SARS-CoV-2 target nucleic acids are NOT DETECTED.  The SARS-CoV-2 RNA is generally detectable in upper respiratory specimens during the acute phase of infection. The lowest concentration of SARS-CoV-2 viral copies this assay can detect is 138 copies/mL. A negative result does not preclude SARS-Cov-2 infection and should  not be used as the sole basis for treatment or other patient management decisions. A negative result may occur with  improper specimen collection/handling, submission of specimen other than nasopharyngeal swab, presence of viral mutation(s) within the areas targeted by this assay, and inadequate number of viral copies(<138 copies/mL). A negative result must be combined with clinical observations, patient history, and epidemiological information. The expected result is Negative.  Fact Sheet for Patients:  BloggerCourse.com  Fact Sheet for Healthcare Providers:  SeriousBroker.it  This test is no t yet approved or cleared by the [place]  FDA and  has been authorized for detection and/or diagnosis of SARS-CoV-2 by FDA under an Emergency Use Authorization (EUA). This EUA will remain  in effect (meaning this test can be used) for the duration of the COVID-19 declaration under Section 564(b)(1) of the Act, 21 U.S.C.section 360bbb-3(b)(1), unless the authorization is terminated  or revoked sooner.       Influenza A by PCR NEGATIVE NEGATIVE Final   Influenza B by PCR NEGATIVE NEGATIVE Final    Comment: (NOTE) The Xpert Xpress SARS-CoV-2/FLU/RSV plus assay is intended as an aid in the diagnosis of influenza from Nasopharyngeal swab specimens and should not be used as a sole basis for treatment. Nasal washings and aspirates  are unacceptable for Xpert Xpress SARS-CoV-2/FLU/RSV testing.  Fact Sheet for Patients: BloggerCourse.com  Fact Sheet for Healthcare Providers: SeriousBroker.it  This test is not yet approved or cleared by the [place]  FDA and has been authorized for detection and/or diagnosis of SARS-CoV-2 by FDA under an Emergency Use Authorization (EUA). This EUA will remain in effect (meaning this test can be used) for the duration of the COVID-19 declaration under Section 564(b)(1) of the Act, 21 U.S.C. section 360bbb-3(b)(1), unless the authorization is terminated or revoked.     Resp Syncytial Virus by PCR NEGATIVE NEGATIVE Final    Comment: (NOTE) Fact Sheet for Patients: BloggerCourse.com  Fact Sheet for Healthcare Providers: SeriousBroker.it  This test is not yet approved or cleared by the [place]  FDA and has been authorized for detection and/or diagnosis of SARS-CoV-2 by FDA under an Emergency Use Authorization (EUA). This EUA will remain in effect (meaning this test can be used) for the duration of the COVID-19 declaration under Section 564(b)(1) of the Act, 21 U.S.C. section 360bbb-3(b)(1), unless the authorization is terminated or revoked.  Performed at [facility], [address] [place], [place] [address]   Culture, blood (Routine X 2) w Reflex to ID Panel     Status: None (Preliminary result)   Collection Time: 06/09/24  2:57 PM   Specimen: BLOOD  Result Value Ref Range Status   Specimen Description BLOOD BLOOD LEFT ARM  Final   Special Requests   Final    BOTTLES DRAWN AEROBIC AND ANAEROBIC Blood Culture adequate volume   Culture   Final    NO GROWTH 2 DAYS Performed at [facility], [address], [place], [place] [address]    Report Status PENDING  Incomplete  Culture, blood (Routine X 2) w Reflex to ID Panel     Status: None  (Preliminary result)   Collection Time: 06/09/24  2:57 PM   Specimen: BLOOD  Result Value Ref Range Status   Specimen Description BLOOD BLOOD RIGHT ARM  Final   Special Requests   Final    BOTTLES DRAWN AEROBIC AND ANAEROBIC Blood Culture adequate volume   Culture   Final    NO GROWTH 2 DAYS Performed at [facility], [address] [place], [place] [address]    Report Status PENDING  Incomplete     Labs: BNP (last 3 results) No results for input(s): BNP in the last 8760 hours. Basic Metabolic Panel: Recent Labs  Lab 06/07/24 0859 06/07/24 1019 06/08/24 0529  NA 136 139 140  K 6.1* 3.5 4.6  CL 105 115* 114*  CO2 21* 18* 23  GLUCOSE 146* 103* 96  BUN 20 17 16   CREATININE 0.98 0.68 0.99  CALCIUM 8.7* 6.5* 8.0*   Liver Function Tests: Recent Labs  Lab 06/07/24 0859 06/08/24 0529  AST 40 17  ALT 9 11  ALKPHOS 123 86  BILITOT 1.3* 0.5  PROT 7.3 5.5*  ALBUMIN 3.9 2.9*   No results for input(s): LIPASE, AMYLASE in the last 168 hours. No results for input(s): AMMONIA in the last 168 hours. CBC: Recent Labs  Lab 06/07/24 0859 06/08/24 0529  WBC 10.9* 5.0  NEUTROABS 10.2*  --   HGB 14.2 11.4*  HCT 43.2 35.0*  MCV 99.8 101.2*  PLT 227 173   Cardiac Enzymes: No results for input(s): CKTOTAL, CKMB, CKMBINDEX, TROPONINI in the last 168 hours. BNP: Invalid input(s): POCBNP CBG: No results for input(s): GLUCAP in the last 168 hours. D-Dimer No results for input(s): DDIMER in the last 72 hours. Hgb A1c No results for input(s): HGBA1C in the last 72 hours. Lipid Profile No results for input(s): CHOL, HDL, LDLCALC, TRIG, CHOLHDL, LDLDIRECT in the last 72 hours. Thyroid  function studies No results for input(s): TSH, T4TOTAL, T3FREE, THYROIDAB in the last 72 hours.  Invalid input(s): FREET3 Anemia work up No results for input(s): VITAMINB12, FOLATE, FERRITIN, TIBC, IRON , RETICCTPCT in the last  72 hours. Urinalysis    Component Value Date/Time   COLORURINE YELLOW (A) 06/07/2024 1252   APPEARANCEUR CLEAR (A) 06/07/2024 1252   LABSPEC 1.017 06/07/2024 1252   PHURINE 5.0 06/07/2024 1252   GLUCOSEU NEGATIVE 06/07/2024 1252   HGBUR NEGATIVE 06/07/2024 1252   BILIRUBINUR NEGATIVE 06/07/2024 1252   KETONESUR NEGATIVE 06/07/2024 1252   PROTEINUR 30 (A) 06/07/2024 1252   UROBILINOGEN 1.0 04/01/2014 1057   NITRITE NEGATIVE 06/07/2024 1252   LEUKOCYTESUR NEGATIVE 06/07/2024 1252   Sepsis Labs Recent Labs  Lab 06/07/24 0859 06/08/24 0529  WBC 10.9* 5.0   Microbiology Recent Results (from the past 240 hours)  Resp panel by RT-PCR (RSV, Flu A&B, Covid) Anterior Nasal Swab     Status: None   Collection Time: 06/07/24  9:24 AM   Specimen: Anterior Nasal Swab  Result Value Ref Range Status   SARS Coronavirus 2 by RT PCR NEGATIVE NEGATIVE Final    Comment: (NOTE) SARS-CoV-2 target nucleic acids are NOT DETECTED.  The SARS-CoV-2 RNA is generally detectable in upper respiratory specimens during the acute phase of infection. The lowest concentration of SARS-CoV-2 viral copies this assay can detect is 138 copies/mL. A negative result does not preclude SARS-Cov-2 infection and should not be used as the sole basis for treatment or other patient management decisions. A negative result may occur with  improper specimen collection/handling, submission of specimen other than nasopharyngeal swab, presence of viral mutation(s) within the areas targeted by this assay, and inadequate number of viral copies(<138 copies/mL). A negative result must be combined with clinical observations, patient history, and epidemiological information. The expected result is Negative.  Fact Sheet for Patients:  BloggerCourse.com  Fact Sheet for Healthcare Providers:  SeriousBroker.it  This test is no t yet approved or cleared by the [place]  [place] FDA and   has been authorized for detection and/or diagnosis of SARS-CoV-2 by FDA under an Emergency Use Authorization (EUA). This EUA will remain  in effect (meaning this test can be used) for the duration of the COVID-19 declaration under Section 564(b)(1) of the Act, 21 U.S.C.section 360bbb-3(b)(1), unless the authorization is terminated  or revoked sooner.       Influenza A by PCR NEGATIVE NEGATIVE Final   Influenza B by PCR NEGATIVE NEGATIVE Final    Comment: (NOTE) The Xpert Xpress SARS-CoV-2/FLU/RSV plus assay is intended as an aid in the diagnosis of influenza from Nasopharyngeal swab specimens and should not be used as a sole basis for treatment. Nasal washings and aspirates are unacceptable for Xpert Xpress SARS-CoV-2/FLU/RSV testing.  Fact Sheet for Patients: BloggerCourse.com  Fact Sheet for Healthcare Providers: SeriousBroker.it  This test is not yet approved or cleared by the [place]  FDA and has been authorized for detection and/or diagnosis of SARS-CoV-2 by FDA under an Emergency Use Authorization (EUA). This EUA will remain in effect (meaning this test can be used) for the duration of the COVID-19 declaration under Section 564(b)(1) of the Act, 21 U.S.C. section 360bbb-3(b)(1), unless the authorization is terminated or revoked.     Resp Syncytial Virus by PCR NEGATIVE NEGATIVE Final    Comment: (NOTE) Fact Sheet for Patients: BloggerCourse.com  Fact Sheet for Healthcare Providers: SeriousBroker.it  This test is not yet approved or cleared by the [place]  FDA and has been authorized for detection and/or diagnosis of SARS-CoV-2 by FDA under an Emergency Use Authorization (EUA). This EUA will remain in effect (meaning this test can be used) for the duration of the COVID-19 declaration under Section 564(b)(1) of the Act, 21 U.S.C. section 360bbb-3(b)(1),  unless the authorization is terminated or revoked.  Performed at [facility], [address] [place], [place] [address]   Culture, blood (Routine X 2) w Reflex to ID Panel     Status: None (Preliminary result)   Collection Time: 06/09/24  2:57 PM   Specimen: BLOOD  Result Value Ref Range Status   Specimen Description BLOOD BLOOD LEFT ARM  Final   Special Requests   Final    BOTTLES DRAWN AEROBIC AND ANAEROBIC Blood Culture adequate volume   Culture   Final    NO GROWTH 2 DAYS Performed at [facility], [address], [place], [place] [address]    Report Status PENDING  Incomplete  Culture, blood (Routine X 2) w Reflex to ID Panel     Status: None (Preliminary result)   Collection Time: 06/09/24  2:57 PM   Specimen: BLOOD  Result Value Ref Range Status   Specimen Description BLOOD BLOOD RIGHT ARM  Final   Special Requests   Final    BOTTLES DRAWN AEROBIC AND ANAEROBIC Blood Culture adequate volume   Culture   Final    NO GROWTH 2 DAYS Performed at [facility], [address] [place], [place] [address]    Report Status PENDING  Incomplete     Time coordinating discharge: 32 min   SIGNED: Kelsha Older, DO [facility] 06/11/2024, 12:55 PM Pager   If 7PM-7AM, please contact night-coverage

## 2024-06-11 NOTE — Hospital Course (Addendum)
 [name] 68 year old male with hypertension, BPH, Crohn's disease, seizure disorder, peptic ulcer disease, intellectual delay, who presents to the ED with a fall and generalized weakness from [facility].  Prior to his fall patient has been suffering from gastroenteritis with diarrhea and vomiting.  Gastroenteritis self resolved.  It was thought that his fall may be secondary to hypotension given volume loss.  He received IV fluids and then transitioned to p.o.  Patient does appear to have a prescription for midodrine  outpatient however blood pressure has been elevated throughout his stay and he has actually required antihypertensives.  He will require close monitoring at home to determine his antihypertensive needs.   Fall -May have been precipitated by hypotension due to volume loss from diarrhea and vomiting. - Status post minor lac to forehead and hand.  Will need hand suture removal. Suture removal: Will be needed by 8/26 --s/p MIVF --orthostatic BP neg after IVF   Hypertension - Patient has history of hypotension requiring midodrine .  While admitted his blood pressure has been elevated.  He was started on amlodipine .  Midodrine  discontinued. - Recommend keeping a blood pressure log to follow-up outpatient with PCP for further antihypertensive management.  N/V, resolved Acute gastroenteritis - Patient reported eating something out of ordinary the day PTA that might have caused him nausea vomiting and diarrhea overnight -Tolerating p.o. now without issue   Developmental disability - At baseline now   History of Crohn's disease --intermittent mild diarrhea which pt said is his baseline.   Mild fever --100.4 night of 8/17, no associated tachycardia, no symptoms of infection -- Cultures negative to date.  No further fever.  Obesity class I, BMI 32.7 - Outpatient follow up for lifestyle modification and risk factor management  Acute metabolic acidosis, resolved --likely due  to vomiting

## 2024-06-13 ENCOUNTER — Other Ambulatory Visit: Payer: Self-pay

## 2024-06-14 LAB — CULTURE, BLOOD (ROUTINE X 2)
Culture: NO GROWTH
Culture: NO GROWTH
Special Requests: ADEQUATE
Special Requests: ADEQUATE

## 2024-07-07 ENCOUNTER — Telehealth: Payer: Self-pay | Admitting: Cardiology

## 2024-07-07 NOTE — Telephone Encounter (Signed)
 Patient BP is still running high, PCP has put in on losartan 25mg  once a day. Calling to make sure that is okay with the Dr and to see if the dr wants the patient to come in to be seen. Please advise

## 2024-07-07 NOTE — Telephone Encounter (Signed)
 Spoke to PCP office Earley Hacker, MD ):  reports pt seen today and BP 152/78 and HR 67, pt without complaints of HA, dizziness, or discomfort - but PCP office reports pt's general decline in wellness after 8/16 ED to hospital admission, PCP started Losartan 25 mg daily  Spoke to sister and HPOA Adina MATSU), reports that she will be flying in from [place] on Wednesday to accompany pt to appt this Thursday with Cadence

## 2024-07-09 NOTE — Telephone Encounter (Signed)
 Forwarded Dr Genice note to PCP

## 2024-07-11 ENCOUNTER — Encounter: Payer: Self-pay | Admitting: Medical

## 2024-07-11 ENCOUNTER — Ambulatory Visit: Attending: Medical | Admitting: Medical

## 2024-07-11 ENCOUNTER — Ambulatory Visit

## 2024-07-11 VITALS — BP 118/43 | HR 65 | Ht 70.0 in | Wt 224.4 lb

## 2024-07-11 DIAGNOSIS — I493 Ventricular premature depolarization: Secondary | ICD-10-CM

## 2024-07-11 DIAGNOSIS — R55 Syncope and collapse: Secondary | ICD-10-CM

## 2024-07-11 DIAGNOSIS — I10 Essential (primary) hypertension: Secondary | ICD-10-CM | POA: Diagnosis not present

## 2024-07-11 DIAGNOSIS — I951 Orthostatic hypotension: Secondary | ICD-10-CM | POA: Diagnosis not present

## 2024-07-11 NOTE — Patient Instructions (Signed)
 Medication Instructions:  Your physician recommends that you continue on your current medications as directed. Please refer to the Current Medication list given to you today.   *If you need a refill on your cardiac medications before your next appointment, please call your pharmacy*  Lab Work: Your provider would like for you to have following labs drawn today CBC.   If you have labs (blood work) drawn today and your tests are completely normal, you will receive your results only by: MyChart Message (if you have MyChart) OR A paper copy in the mail If you have any lab test that is abnormal or we need to change your treatment, we will call you to review the results.  Testing/Procedures: Your physician has requested that you have an echocardiogram. Echocardiography is a painless test that uses sound waves to create images of your heart. It provides your doctor with information about the size and shape of your heart and how well your heart's chambers and valves are working.   You may receive an ultrasound enhancing agent through an IV if needed to better visualize your heart during the echo. This procedure takes approximately one hour.  There are no restrictions for this procedure.  This will take place at [address] [facility]) [address], [place] [address]  Please note: We ask at that you not bring children with you during ultrasound (echo/ vascular) testing. Due to room size and safety concerns, children are not allowed in the ultrasound rooms during exams. Our front office staff cannot provide observation of children in our lobby area while testing is being conducted. An adult accompanying a patient to their appointment will only be allowed in the ultrasound room at the discretion of the ultrasound technician under special circumstances. We apologize for any inconvenience.   Your physician has requested that you have a carotid duplex. This test is an ultrasound of the carotid  arteries in your neck. It looks at blood flow through these arteries that supply the brain with blood.   Allow one hour for this exam.  There are no restrictions or special instructions.  This will take place at [address] [facility] [address] [facility]) [address], [place] [address]  Please note: We ask at that you not bring children with you during ultrasound (echo/ vascular) testing. Due to room size and safety concerns, children are not allowed in the ultrasound rooms during exams. Our front office staff cannot provide observation of children in our lobby area while testing is being conducted. An adult accompanying a patient to their appointment will only be allowed in the ultrasound room at the discretion of the ultrasound technician under special circumstances. We apologize for any inconvenience.   ZIO XT- Long Term Monitor Instructions  Your physician has requested you wear a ZIO patch monitor for 14 days.  This is a single patch monitor. [employer] supplies one patch monitor per enrollment. Additional stickers are not available. Please do not apply patch if you will be having a Nuclear Stress Test, Echocardiogram, Cardiac CT, MRI, or Chest Xray during the period you would be wearing the monitor. The patch cannot be worn during these tests. You cannot remove and re-apply the ZIO XT patch monitor.  Your ZIO patch monitor will be mailed 3 day USPS to your address on file. It may take 3-5 days to receive your monitor after you have been enrolled. Once you have received your monitor, please review the enclosed instructions. Your monitor has already been registered assigning a  specific monitor serial number to you.  Billing and Patient Assistance Program Information  We have supplied [employer] with any of your insurance information on file for billing purposes.  [employer] offers a sliding scale Patient Assistance Program for patients that do not have insurance, or whose insurance does not completely cover the  cost of the ZIO monitor.  You must apply for the Patient Assistance Program to qualify for this discounted rate.  To apply, please call [employer] at [phone], select option 4, select option 2, ask to apply for Patient Assistance Program. Meredeth will ask your household income, and how many people are in your household. They will quote your out-of-pocket cost based on that information. [employer] will also be able to set up a 59-month, interest-free payment plan if needed.  Applying the monitor   Shave hair from upper left chest.  Hold abrader disc by orange tab. Rub abrader in 40 strokes over the upper left chest as indicated in your monitor instructions.  Clean area with 4 enclosed alcohol pads. Let dry.  Apply patch as indicated in monitor instructions. Patch will be placed under collarbone on left side of chest with arrow pointing upward.  Rub patch adhesive wings for 2 minutes. Remove white label marked 1. Remove the white label marked 2. Rub patch adhesive wings for 2 additional minutes.  While looking in a mirror, press and release button in center of patch. A small green light will flash 3-4 times. This will be your only indicator that the monitor has been turned on.  Do not shower for the first 24 hours. You may shower after the first 24 hours.  Press the button if you feel a symptom. You will hear a small click. Record Date, Time and Symptom in the Patient Logbook.  When you are ready to remove the patch, follow instructions on the last 2 pages of Patient Logbook.  Stick patch monitor into the tabs at the bottom of the return box.  Place Patient Logbook in the blue and white box. Use locking tab on box and tape box closed securely. The blue and white box has prepaid postage on it. Please place it in the mailbox as soon as possible. Your physician should have your test results approximately 7-14 days after the monitor has been mailed back to [facility].  Call [facility] Customer  Care at [phone] if you have questions regarding your ZIO XT patch monitor.  Call them immediately if you see an orange light blinking on your monitor.  If your monitor falls off in less than 4 days, contact our Monitor department at [phone].  If your monitor becomes loose or falls off after 4 days call [employer] at [phone] for suggestions on securing your monitor.   Follow-Up: At [facility], you and your health needs are our priority.  As part of our continuing mission to provide you with exceptional heart care, our providers are all part of one team.  This team includes your primary Cardiologist (physician) and Advanced Practice Providers or APPs (Physician Assistants and Nurse Practitioners) who all work together to provide you with the care you need, when you need it.  Your next appointment:   2 month(s)  Provider:   You may see Dr. Darliss or one of the following Advanced Practice Providers on your designated Care Team:   Lonni Meager, NP Lesley Maffucci, PA-C Bernardino Bring, PA-C Cadence [place], PA-C Tylene Lunch, NP Barnie Hila, NP    We recommend signing up for the  patient portal called MyChart.  Sign up information is provided on this After Visit Summary.  MyChart is used to connect with patients for Virtual Visits (Telemedicine).  Patients are able to view lab/test results, encounter notes, upcoming appointments, etc.  Non-urgent messages can be sent to your provider as well.   To learn more about what you can do with MyChart, go to [email or web address].

## 2024-07-11 NOTE — Progress Notes (Signed)
 Cardiology Office Note   Date:  07/11/2024  ID:  [name], DOB [birth year], MRN [id number] PCP: Cordella Corning, FNP  [facility] Providers Cardiologist:  None Electrophysiologist:  [name], MD   History of Present Illness [name] is a 68 y.o. male with a h/o dilated thoracic aorta, LBBB, left lower extremity DVT, BPH, Crohn's disease, seizure disorder, intellectual delay, chronic diastolic heart failure, and frequent PVCs who presents for follow-up.   Patient has a history of postural dizziness and orthostasis on midodrine .   Patient saw Dr. HOLTS 08/2023 for PVCs. He was asymptomatic with normal EF so no AA medications were recommended.   He was admitted to the hospital in August 2025 for a fall. They suspected this was due to hypotension due to volume loss from diarrhea and vomiting. He was given IVF and orthostatics improved.  Blood pressure became elevated and midodrine  was stopped, and he was started amlodipine .  Patient was discharged to [facility] house, which she has been at for 3 years.  Today, sister is present as patient has cognitive disabilities.  She reports BP has been all over.  Patient brought a sheet that shows blood pressures that seem to have been normal to elevated at his facility.  The patient has chronic shortness of breath. He denies chest pain. He is having issues with nutrition as he has Crohn's disease and the facility sometimes does not accommodate him.  The sister reported MD stopped amlodipine  last week.  He was not on midodrine .  He is taking losartan 25 mg daily.  Blood pressure today is normal, diastolic is lower.  Studies Reviewed EKG Interpretation Date/Time:  Friday July 11 2024 13:42:21 EDT Ventricular Rate:  65 PR Interval:  214 QRS Duration:  92 QT Interval:  418 QTC Calculation: 434 R Axis:   17  Text Interpretation: Sinus rhythm with 1st degree A-V block Septal infarct , age undetermined When compared with  ECG of 07-Jun-2024 07:52, PREVIOUS ECG IS PRESENT Confirmed by Franchester Mail ([id number]) on 07/11/2024 1:45:57 PM    Myoview  Lexi scan 05/2023   Normal pharmacologic myocardial perfusion stress test without evidence of significant ischemia or scar.   Left ventricular systolic function is normal by visual estimation and Siemens calculation (LVEF 61%).   Calcifications near the aortic valve annulus and ostium of the left main coronary artery are noted.   This is a low-risk study.  Echocardiogram 03/2023 1. Left ventricular ejection fraction, by estimation, is 55 to 60%. The  left ventricle has normal function. The left ventricle has no regional  wall motion abnormalities. Left ventricular diastolic parameters are  consistent with Grade I diastolic  dysfunction (impaired relaxation).   2. Right ventricular systolic function is normal. The right ventricular  size is normal. Tricuspid regurgitation signal is inadequate for assessing  PA pressure.   3. The mitral valve is normal in structure. Mild mitral valve  regurgitation. No evidence of mitral stenosis.   4. The aortic valve is normal in structure. There is mild calcification  of the aortic valve. Aortic valve regurgitation is mild. Mild to moderate  aortic valve stenosis. Aortic valve area, by VTI measures 1.58 cm. Aortic  valve mean gradient measures  13.5 mmHg. Aortic valve Vmax measures 2.45 m/s.   5. The inferior vena cava is normal in size with greater than 50%  respiratory variability, suggesting right atrial pressure of 3 mmHg.    Heart monitor 04-2023    14-day Zio patch monitor (May-June  2024)   Predominant rhythm: Sinus (1  AVB): HR range 43-107 bpm, Avg 61 bpm.   Frequent (~20%) PVCw with rare couplets and triplets.  Bigeminy and trigeminy about 8-9 minutes. ->  Couplets noted with both monomorphic and polymorphic beats.   Rare PACs with couplets and triplets.   1 4 beat run of  Nonsustained Ventricular Tachycardia (actually  PVC followed by a PVC triplet of differnet morphology)   9 Atrial Runs: Fastest was 5 beats (2.3 sec) HR 126 739 bpm, Avg 134 bpm; longest 19 beats (11.5 sec) HR 91-109 bpm,Avg 101 bpm.   No events noted on diary   No Sustained Arrhythmias: Atrial Tachycardia (AT), Supraventricular Tachycardia (SVT), Atrial Fibrillation (A-Fib), Atrial Flutter (A-Flutter), Sustained Ventricular Tachycardia (VT)   No symptoms noted.  However 20% PVCs is somewhat concerning.  The rest of the monitor is pretty unremarkable.  Nothing to explain passing out.   With a normal echocardiogram, does not appear to be structural abnormality.  Neck step would be to consider stress test evaluation to rule out heart artery disease..  If that is normal, but then I would low threshold to consider cardiac MRI to look for undiagnosed structural abnormalities that would not be seen on echo.   Would like to check a Myoview  Stress Test prior to 8/15 f/u -- will need to contact Randine to discuss Informed Consent.      Physical Exam VS:  BP (!) 118/43 (BP Location: Left Arm, Patient Position: Sitting, Cuff Size: Normal)   Pulse 65   Ht 5' 10 (1.778 m)   Wt 224 lb 6.4 oz (101.8 kg)   SpO2 100%   BMI 32.20 kg/m        Wt Readings from Last 3 Encounters:  07/11/24 224 lb 6.4 oz (101.8 kg)  06/07/24 228 lb (103.4 kg)  02/25/24 222 lb 3.2 oz (100.8 kg)    GEN: Well nourished, well developed in no acute distress NECK: No JVD; + carotid bruits CARDIAC: RRR, + murmurs, rubs, gallops RESPIRATORY:  Clear to auscultation without rales, wheezing or rhonchi  ABDOMEN: Soft, non-tender, non-distended EXTREMITIES:  No edema; No deformity   ASSESSMENT AND PLAN  Fall/loss of consciousness History of orthostatic hypotension Patient was recently admitted for fall secondary to dehydration due to vomiting and diarrhea.  He has a history of orthostatic hypotension and was on midodrine  prior to admission.  Patient was given IV fluids with  improvement.  During hospitalization his blood pressure became elevated and midodrine  was stopped and he was started on amlodipine  and losartan.  Patient is no longer on amlodipine .  He is on losartan 25 mg daily.  Blood pressures at [facility] home have been normal to mildly elevated.  Today blood pressure is 118/43.  Patient is not very functional at baseline, he uses a walker and has cognitive dysfunction.  Patient has chronic shortness of breath at baseline.  His sister feels he is still regaining strength since the hospitalization.  I will update a CBC as hemoglobin was downtrending on the most recent check.  No further falls or syncopal episodes reported.  I will check an echocardiogram, carotid ultrasound, and 2-week heart monitor.  Hypertension Blood pressures at his site mildly elevated.  Blood pressure today is 118/43.  MD at the home stopped amlodipine .  He is taking losartan 25 mg daily.  He will continue with losartan at this time.  Continue to monitor blood pressures 2 hours after medications.  If systolics remain greater than 140s  can increase losartan to 50 mg daily.         Dispo: Follow-up in 2 months  Signed, Les Longmore VEAR Fishman, PA-C

## 2024-07-12 LAB — CBC
Hematocrit: 37.8 % (ref 37.5–51.0)
Hemoglobin: 12.5 g/dL — ABNORMAL LOW (ref 13.0–17.7)
MCH: 33.2 pg — ABNORMAL HIGH (ref 26.6–33.0)
MCHC: 33.1 g/dL (ref 31.5–35.7)
MCV: 100 fL — ABNORMAL HIGH (ref 79–97)
Platelets: 216 x10E3/uL (ref 150–450)
RBC: 3.77 x10E6/uL — ABNORMAL LOW (ref 4.14–5.80)
RDW: 12.3 % (ref 11.6–15.4)
WBC: 4.6 x10E3/uL (ref 3.4–10.8)

## 2024-07-14 ENCOUNTER — Ambulatory Visit: Payer: Self-pay | Admitting: Medical

## 2024-07-14 ENCOUNTER — Other Ambulatory Visit: Payer: Self-pay

## 2024-07-14 ENCOUNTER — Ambulatory Visit: Attending: Medical

## 2024-07-14 DIAGNOSIS — I493 Ventricular premature depolarization: Secondary | ICD-10-CM

## 2024-07-17 ENCOUNTER — Telehealth: Payer: Self-pay

## 2024-07-17 NOTE — Telephone Encounter (Signed)
 Patient's sister is in office today states that 2 Zio monitors were shipped to her home in [place] , she has been on the phone with [employer] to verify [place], [place] address. Staff in office have verified mailing address as [place], [place]. Currently, waiting to see where the 3rd monitor was shipped. Patient's sister is going to return the ones shipped to [place]  once she gets back home.

## 2024-07-24 ENCOUNTER — Telehealth: Payer: Self-pay | Admitting: Internal Medicine

## 2024-07-24 ENCOUNTER — Ambulatory Visit: Admitting: Podiatry

## 2024-07-24 ENCOUNTER — Encounter: Payer: Self-pay | Admitting: Podiatry

## 2024-07-24 DIAGNOSIS — D689 Coagulation defect, unspecified: Secondary | ICD-10-CM | POA: Diagnosis not present

## 2024-07-24 DIAGNOSIS — M79675 Pain in left toe(s): Secondary | ICD-10-CM | POA: Diagnosis not present

## 2024-07-24 DIAGNOSIS — B351 Tinea unguium: Secondary | ICD-10-CM | POA: Diagnosis not present

## 2024-07-24 DIAGNOSIS — M79674 Pain in right toe(s): Secondary | ICD-10-CM

## 2024-07-24 NOTE — Telephone Encounter (Signed)
 Inbound call from [facility] requesting a new dietary restriction list faxed to   [phone] Att to ED and dietary manager.

## 2024-07-24 NOTE — Telephone Encounter (Signed)
 Received a call from [employer], [name] who states that they need an updated dietary plan to go by for patient's crohns to provide the state.   There is a restriction list (can be found under media tab 05/30/23) but this was created entirely by patient's POA/Cousin [name] and signed from Dr Albertus stating that he agrees.  [name]'s concern is that this restriction list is so expansive that patient is not getting proper nutrition to sustain his health and wants to know if this list is specifically what Dr Albertus wants.  I advised Jonathan that typically, we do not have any specific dietary recommendation for crohns as patient's generally tolerate most foods ok as long as there disease is well controlled with medications. Advised that I will reach out to Dr Albertus regarding this and then provide his response.  [facility] Phone- [phone] ext [phone] [name]- [phone]  She asks that this information also be cc'ed to the Optometrist and Librarian, academic at [employer].

## 2024-07-24 NOTE — Telephone Encounter (Signed)
 Dr Albertus, any dietary restrictions for patient from a GI standpoint?

## 2024-07-25 NOTE — Telephone Encounter (Signed)
 Spoke to [place] at [employer] and advised that low residue diet and soda restrictions have been printed in a letter and faxed to their location. She verbalizes understanding.

## 2024-07-25 NOTE — Telephone Encounter (Signed)
 Letter has been created for low residue diet and soda avoidance recommendation and faxed to [facility] attnBETHA Alanis at fax [phone].

## 2024-07-27 NOTE — Progress Notes (Signed)
  Subjective:  Patient ID: [name], male    DOB: 1956/04/08,  MRN: [id number]  [name] presents to clinic today for at risk foot care with h/o clotting disorder and painful thick toenails that are difficult to trim. Pain interferes with ambulation. Aggravating factors include wearing enclosed shoe gear. Pain is relieved with periodic professional debridement. He is accompanied by staff member, [name], from [facility]. Chief Complaint  Patient presents with   Toe Pain    RFC. Dr. Gordy Starch is his PCP   New problem(s): None.   PCP is Cordella Corning, FNP.  Allergies  Allergen Reactions   Other Other (See Comments)    Ask   Propoxyphene Other (See Comments)    Unknown reaction    Review of Systems: Negative except as noted in the HPI.  Objective: No changes noted in today's physical examination. There were no vitals filed for this visit. [name] is a pleasant 68 y.o. male in NAD. AAO x 3.  Neurovascular status intact b/l and symmetrically.  Dermatological Examination: Pedal integument with normal turgor, texture and tone b/l LE. No open wounds b/l. No interdigital macerations b/l. Toenails 1-5 b/l elongated, thickened, discolored with subungual debris. +Tenderness with dorsal palpation of nailplates. No hyperkeratotic or porokeratotic lesions present.  Musculoskeletal Examination: Muscle strength 5/5 to all lower extremity muscle groups bilaterally. No pain, crepitus or joint limitation noted with ROM bilateral LE. No gross bony deformities bilaterally. Patient ambulates independent of any assistive aids.  Assessment/Plan: 1. Pain due to onychomycosis of toenails of both feet   2. Clotting disorder    -Facility staff present with patient.  All questions/concerns addressed on today's visit. -Consent given for treatment as described below: -Examined patient. -Mycotic toenails 1-5 bilaterally were debrided in length and girth with  sterile nail nippers and dremel without incident. -Patient/POA to call should there be question/concern in the interim.   Return in about 3 months (around 10/24/2024).  [name], DPM      [facility] LOCATION: [address], [place] [address]                   Office [phone]   [facility] LOCATION: [address] [place], [place] [address] Office [phone]

## 2024-08-05 NOTE — Telephone Encounter (Signed)
 Patients sister called stating that she is supposed to have a meeting with [facility] at 8:00 and is requesting a call back to discuss before the end of the day. Please advise.

## 2024-08-05 NOTE — Telephone Encounter (Signed)
 PT sister Donny is calling about the new food list that was sent to [facility]. She stated that it was not what her and Dr. Albertus agreed upon. Please advise.

## 2024-08-06 NOTE — Telephone Encounter (Signed)
 Given the situation you describe it is okay to reinstitute previous recommended dietary change.

## 2024-08-06 NOTE — Telephone Encounter (Signed)
 Patient's sister/POA calls with concerns regarding updated diet sent to [facility] for patient (providing low fiber, no sodas). Donny says last year, Dr Albertus asked that she come up with a list of foods that were triggers for Wilfr[place] and he would sign off that Damarian could not be provided these foods. States she did make this list and he signed off. Donny states that [employer] is basically starving the patient and is providing him with a piece of [place] for a meal, or 2 pieces of bread and Tuna etc. States [employer] only wants to provide cheap foods such as collard greens and rice which she says she knows are triggers for him. Donny asks that Dr Albertus reinstate that expansive list of foods to avoid with [facility].   Concern from nutrition manager with [facility] when she called 07/24/24 was that trigger foods to avoid was so large that patient is not getting proper nutrition.  Please advise.

## 2024-08-07 NOTE — Telephone Encounter (Signed)
 Patient's sister is requesting a call to discuss previous notes further. Please advise, thank you

## 2024-08-07 NOTE — Telephone Encounter (Signed)
 Will reach out to [facility] once Dr Albertus reviews notes further. I think he was amending some information from the diet.

## 2024-08-08 NOTE — Telephone Encounter (Signed)
 I have faxed updated more restrictive diet plan to [facility] (fax [phone]) to attention [facility] following the conversation between Dr Albertus and Donny (patient POA) yesterday.   I attempted to reach [facility] by phone at [phone] to advise of this updated information as well. Unfortunately, I was unable to speak with Georgeanna so I did leave a detailed voicemail indicating that we would like to revoke the previous dietary changes sent to [facility] on 08/04/24 and that I have instead sent an updated dietary restriction list to them this morning via fax.   I spoke to [facility] to advise her we have sent an updated dietary restriction list to [facility] and that I have left a voicemail for [employer], Optometrist advising of this information as well. Discussed with Donny that I have made this same note available in Gatlyn's mychart for her review as well. Cathy verbalizes understanding and thanks us  for our help.

## 2024-08-14 ENCOUNTER — Ambulatory Visit: Admitting: Cardiology

## 2024-08-18 ENCOUNTER — Ambulatory Visit: Payer: Self-pay | Admitting: Medical

## 2024-08-18 DIAGNOSIS — I493 Ventricular premature depolarization: Secondary | ICD-10-CM | POA: Diagnosis not present

## 2024-08-28 ENCOUNTER — Ambulatory Visit: Attending: Medical

## 2024-08-28 ENCOUNTER — Ambulatory Visit (INDEPENDENT_AMBULATORY_CARE_PROVIDER_SITE_OTHER)

## 2024-08-28 ENCOUNTER — Other Ambulatory Visit: Payer: Self-pay | Admitting: Medical

## 2024-08-28 DIAGNOSIS — I951 Orthostatic hypotension: Secondary | ICD-10-CM

## 2024-08-28 DIAGNOSIS — I493 Ventricular premature depolarization: Secondary | ICD-10-CM | POA: Diagnosis not present

## 2024-08-28 DIAGNOSIS — R55 Syncope and collapse: Secondary | ICD-10-CM

## 2024-08-28 DIAGNOSIS — I351 Nonrheumatic aortic (valve) insufficiency: Secondary | ICD-10-CM | POA: Diagnosis not present

## 2024-08-28 DIAGNOSIS — I10 Essential (primary) hypertension: Secondary | ICD-10-CM

## 2024-08-28 LAB — ECHOCARDIOGRAM COMPLETE
AR max vel: 0.88 cm2
AV Area VTI: 0.91 cm2
AV Area mean vel: 0.88 cm2
AV Mean grad: 16 mmHg
AV Peak grad: 27.2 mmHg
Ao pk vel: 2.61 m/s
MV VTI: 1.38 cm2
S' Lateral: 3.4 cm

## 2024-09-02 ENCOUNTER — Encounter: Payer: Self-pay | Admitting: Medical

## 2024-09-02 ENCOUNTER — Ambulatory Visit: Attending: Medical | Admitting: Medical

## 2024-09-02 VITALS — BP 115/50 | HR 62 | Ht 70.0 in | Wt 234.2 lb

## 2024-09-02 DIAGNOSIS — I951 Orthostatic hypotension: Secondary | ICD-10-CM

## 2024-09-02 DIAGNOSIS — I10 Essential (primary) hypertension: Secondary | ICD-10-CM | POA: Diagnosis not present

## 2024-09-02 DIAGNOSIS — R55 Syncope and collapse: Secondary | ICD-10-CM

## 2024-09-02 DIAGNOSIS — I350 Nonrheumatic aortic (valve) stenosis: Secondary | ICD-10-CM | POA: Diagnosis not present

## 2024-09-02 DIAGNOSIS — I493 Ventricular premature depolarization: Secondary | ICD-10-CM | POA: Diagnosis not present

## 2024-09-02 MED ORDER — AMLODIPINE BESYLATE 5 MG PO TABS: 5.0000 mg | ORAL_TABLET | Freq: Every day | ORAL | 3 refills | Status: AC

## 2024-09-02 NOTE — Patient Instructions (Signed)
 Medication Instructions:  Your physician recommends the following medication changes.  DECREASE: Amlodipine  to 5 mg by mouth daily    *If you need a refill on your cardiac medications before your next appointment, please call your pharmacy*  Lab Work: No labs ordered today    Testing/Procedures: No test ordered today   Follow-Up: At [facility], you and your health needs are our priority.  As part of our continuing mission to provide you with exceptional heart care, our providers are all part of one team.  This team includes your primary Cardiologist (physician) and Advanced Practice Providers or APPs (Physician Assistants and Nurse Practitioners) who all work together to provide you with the care you need, when you need it.  Your next appointment:   3 month(s)  Provider:   Alm Clay, MD

## 2024-09-02 NOTE — Progress Notes (Signed)
 Cardiology Office Note   Date:  09/02/2024  ID:  [name], DOB 12-Nov-1955, MRN [id number] PCP: Cordella Corning, FNP  [facility] Providers Cardiologist:  None Electrophysiologist:  [name], MD   History of Present Illness [name] is a 69 y.o. male  with a h/o dilated thoracic aorta, LBBB, left lower extremity DVT, BPH, Crohn's disease, seizure disorder, intellectual delay, chronic diastolic heart failure, h/o orthostatic hypotension and syncope, and frequent PVCs who presents for follow-up of syncope.    Patient has a history of postural dizziness and orthostasis previously on midodrine .    Patient saw Dr. Holts 08/2023 for PVCs. He was asymptomatic with normal EF so no AA medications were recommended.    He was admitted to the hospital in August 2025 for a fall. They suspected this was due to hypotension due to volume loss from diarrhea and vomiting. He was given IVF and orthostatics improved.  Blood pressure became elevated and midodrine  was stopped, and he was started amlodipine .  Patient was discharged to [facility] house, which she has been at for 3 years.  Patient was last seen on 07/11/24 and his sister reported labile blood pressure.  Patient was taking only losartan.  Upon review of blood pressures, they were normal to mildly elevated.  Echocardiogram, carotid ultrasound, and heart monitor were ordered.  Echo showed EF 60 to 65%, moderate calcification of aortic valve, mild to moderate AI, moderate low-flow, low gradient stenosis.  Carotid ultrasound showed no evidence of stenosis in the right, 1 to 39% in the left.  Heart monitor showed normal sinus rhythm, average heart rate of 67, 13 runs of SVT, longest 14 beats.  Today, the patient is overall doing well. Sister feels nutrition is getting better. He was started on protein shakes. He is walking more with his walker. The patient denies chest pain or SOB. He is walking with the walker up the hallways  and feels this is improving. No dizziness or lightheadedness, no syncope   Studies Reviewed EKG Interpretation Date/Time:  Tuesday September 02 2024 10:34:59 EST Ventricular Rate:  62 PR Interval:  236 QRS Duration:  90 QT Interval:  428 QTC Calculation: 434 R Axis:   10  Text Interpretation: Sinus rhythm with 1st degree A-V block When compared with ECG of 11-Jul-2024 13:42, No significant change was found Confirmed by Franchester, Erick Murin ([id number]) on 09/02/2024 10:49:32 AM    Echo 08/2024 1. Left ventricular ejection fraction, by estimation, is 60 to 65%. The  left ventricle has normal function. Left ventricular endocardial border  not optimally defined to evaluate regional wall motion. Left ventricular  diastolic parameters are  indeterminate.   2. Right ventricular systolic function is normal. The right ventricular  size is normal.   3. The mitral valve is normal in structure. Trivial mitral valve  regurgitation. No evidence of mitral stenosis.   4. The aortic valve has an indeterminant number of cusps. There is  moderate calcification of the aortic valve. Aortic valve regurgitation is  mild to moderate. There is moderate low-flow, low-gradient stenosis.  Aortic valve area, by VTI measures 0.91  cm. Aortic valve mean gradient measures 16.0 mmHg. Aortic valve Vmax  measures 2.61 m/s. DVI of 0.29 with SVi of 26.   5. There is borderline dilatation of the ascending aorta, measuring 38  mm.   Carotid US  08/2024 Summary:  Right Carotid: There is no evidence of stenosis in the right ICA.   Left Carotid: Velocities in the left  ICA are consistent with a 1-39%  stenosis.   Vertebrals: Bilateral vertebral arteries demonstrate antegrade flow.  Subclavians: Normal flow hemodynamics were seen in bilateral subclavian               arteries.   Heart monitor 07/2024 HR 49 to 140, average 67. 13 SVT episodes, longest 14 beats. Rare supraventricular and ventricular ectopy. No sustained  arrhythmias. No atrial fibrillation.   Ole T. Cindie, MD, [facility], [facility] Cardiac Electrophysiology  Myoview  Lexi scan 05/2023   Normal pharmacologic myocardial perfusion stress test without evidence of significant ischemia or scar.   Left ventricular systolic function is normal by visual estimation and Siemens calculation (LVEF 61%).   Calcifications near the aortic valve annulus and ostium of the left main coronary artery are noted.   This is a low-risk study.   Echocardiogram 03/2023 1. Left ventricular ejection fraction, by estimation, is 55 to 60%. The  left ventricle has normal function. The left ventricle has no regional  wall motion abnormalities. Left ventricular diastolic parameters are  consistent with Grade I diastolic  dysfunction (impaired relaxation).   2. Right ventricular systolic function is normal. The right ventricular  size is normal. Tricuspid regurgitation signal is inadequate for assessing  PA pressure.   3. The mitral valve is normal in structure. Mild mitral valve  regurgitation. No evidence of mitral stenosis.   4. The aortic valve is normal in structure. There is mild calcification  of the aortic valve. Aortic valve regurgitation is mild. Mild to moderate  aortic valve stenosis. Aortic valve area, by VTI measures 1.58 cm. Aortic  valve mean gradient measures  13.5 mmHg. Aortic valve Vmax measures 2.45 m/s.   5. The inferior vena cava is normal in size with greater than 50%  respiratory variability, suggesting right atrial pressure of 3 mmHg.      Heart monitor 04-2023    14-day Zio patch monitor (May-June 2024)   Predominant rhythm: Sinus (1  AVB): HR range 43-107 bpm, Avg 61 bpm.   Frequent (~20%) PVCw with rare couplets and triplets.  Bigeminy and trigeminy about 8-9 minutes. ->  Couplets noted with both monomorphic and polymorphic beats.   Rare PACs with couplets and triplets.   1 4 beat run of  Nonsustained Ventricular Tachycardia (actually PVC  followed by a PVC triplet of differnet morphology)   9 Atrial Runs: Fastest was 5 beats (2.3 sec) HR 126 739 bpm, Avg 134 bpm; longest 19 beats (11.5 sec) HR 91-109 bpm,Avg 101 bpm.   No events noted on diary   No Sustained Arrhythmias: Atrial Tachycardia (AT), Supraventricular Tachycardia (SVT), Atrial Fibrillation (A-Fib), Atrial Flutter (A-Flutter), Sustained Ventricular Tachycardia (VT)   No symptoms noted.  However 20% PVCs is somewhat concerning.  The rest of the monitor is pretty unremarkable.  Nothing to explain passing out.    Physical Exam VS:  BP (!) 115/50 (BP Location: Left Arm, Patient Position: Sitting, Cuff Size: Normal)   Pulse 62 Comment: 65 oximeter  Ht 5' 10 (1.778 m)   Wt 234 lb 3.2 oz (106.2 kg)   SpO2 99%   BMI 33.60 kg/m        Wt Readings from Last 3 Encounters:  09/02/24 234 lb 3.2 oz (106.2 kg)  07/11/24 224 lb 6.4 oz (101.8 kg)  06/07/24 228 lb (103.4 kg)    GEN: Well nourished, well developed in no acute distress NECK: No JVD; No carotid bruits CARDIAC: RRR, no murmurs, rubs, gallops RESPIRATORY:  Clear to  auscultation without rales, wheezing or rhonchi  ABDOMEN: Soft, non-tender, non-distended EXTREMITIES:  No edema; No deformity   ASSESSMENT AND PLAN  Falls/Syncope H/o orthostatic hypotension Patient was previously admitted for fall secondary to dehydration due to vomiting and diarrhea.  He was on midodrine  prior to hospitalization, but during hospitalization blood pressure was elevated and midodrine  was stopped and he was started on amlodipine .  Blood pressure has been labile since then.  Today blood pressure is 115/50.  He is taking amlodipine  10 mg a day and losartan 25 mg daily.  Heart monitor showed normal sinus rhythm with 13 runs of SVT, longest 14 beats.  Carotid ultrasound showed minimal disease on the left.  Echo showed normal pump function, trivial MR, moderate calcification of aortic valve with mild to moderate AI and moderate low-flow, low  gradient stenosis.  Patient has been eating and drinking more.  Patient has been walking with his walker daily up and down the hall.  He denies any further presyncope or syncope.  I will decrease amlodipine  to 5 mg daily given history of orthostatic hypotension, syncope, and AS.  Continue losartan 25 mg daily.  Moderate AS Recent echo showed normal pump function with moderate AI and moderate low-flow, low gradient stenosis.  Doubt patient is a candidate for valve surgery or procedure.  Will have him follow-up with MD.  HTN Blood pressure today is 115/50.  I will decrease amlodipine  to 5 mg daily given history of orthostatic hypotension, syncope, and AS.  Continue losartan 25 mg daily.  Frequent PVCs Prior heart monitor showed 20% PVC burden. Recent heart monitor showed NSR with rare ventricular ectopy.        Dispo: Follow-up in 3 months  Signed, Celestia Duva VEAR Fishman, PA-C

## 2024-09-15 ENCOUNTER — Encounter: Payer: Self-pay | Admitting: Neurology

## 2024-10-27 ENCOUNTER — Ambulatory Visit: Admitting: Podiatry

## 2024-10-27 DIAGNOSIS — M79675 Pain in left toe(s): Secondary | ICD-10-CM | POA: Diagnosis not present

## 2024-10-27 DIAGNOSIS — M79674 Pain in right toe(s): Secondary | ICD-10-CM | POA: Diagnosis not present

## 2024-10-27 DIAGNOSIS — B351 Tinea unguium: Secondary | ICD-10-CM

## 2024-10-27 DIAGNOSIS — D689 Coagulation defect, unspecified: Secondary | ICD-10-CM | POA: Diagnosis not present

## 2024-10-31 ENCOUNTER — Encounter: Payer: Self-pay | Admitting: Podiatry

## 2024-10-31 NOTE — Progress Notes (Signed)
"  °  Subjective:  Patient ID: [name], male    DOB: 1956/09/02,  MRN: [id number]  [name] presents to clinic today for at risk foot care with h/o clotting disorder and painful mycotic toenails of both feet that are difficult to trim. Pain interferes with daily activities and wearing enclosed shoe gear comfortably. He is a resident of [facility].  Chief Complaint  Patient presents with   Nail Problem    Denies being diabetic. He can't recall his last visit with his PCP FNP Cordella   New problem(s): None.   PCP is Cordella Corning, FNP.  Allergies[1]  Review of Systems: Negative except as noted in the HPI.  Objective:  There were no vitals filed for this visit. [name] is a pleasant 69 y.o. male in NAD. AAO x 3.  Neurovascular status intact b/l and symmetrically.  Dermatological Examination: Pedal integument with normal turgor, texture and tone b/l LE. No open wounds b/l. No interdigital macerations b/l. Toenails 1-5 b/l elongated, thickened, discolored with subungual debris. +Tenderness with dorsal palpation of nailplates. No hyperkeratotic or porokeratotic lesions present.  Musculoskeletal Examination: Muscle strength 5/5 to all lower extremity muscle groups bilaterally. No pain, crepitus or joint limitation noted with ROM bilateral LE. No gross bony deformities bilaterally. Utilizes rollator for ambulation assistance.  Assessment/Plan: 1. Pain due to onychomycosis of toenails of both feet   2. Clotting disorder     Patient was evaluated and treated. All patient's and/or POA's questions/concerns addressed on today's visit. Toenails 1-5 b/l debrided in length and girth without incident. Continue foot and shoe inspections daily. Monitor blood glucose per PCP/Endocrinologist's recommendations. Continue soft, supportive shoe gear daily. Report any pedal injuries to medical professional. Call office if there are any questions/concerns. -Patient/POA  to call should there be question/concern in the interim.   Return in about 3 months (around 01/25/2025).  [name], DPM      [place] LOCATION: [address], [place] [address]                   Office [phone]   [place] LOCATION: [address] [place], [place] [address] Office [phone]     [1]  Allergies Allergen Reactions   Other Other (See Comments)    Ask   Propoxyphene Other (See Comments)    Unknown reaction   "

## 2024-12-04 ENCOUNTER — Ambulatory Visit: Admitting: Cardiology

## 2025-01-29 ENCOUNTER — Ambulatory Visit: Admitting: Podiatry

## 2025-02-24 ENCOUNTER — Ambulatory Visit: Admitting: Neurology

## 2025-03-04 ENCOUNTER — Ambulatory Visit: Admitting: Neurology
